# Patient Record
Sex: Female | Born: 1960
Health system: Southern US, Community
[De-identification: ages and names within clinical notes are randomized; demographics above are authoritative.]

## PROBLEM LIST (undated history)

## (undated) DIAGNOSIS — F988 Other specified behavioral and emotional disorders with onset usually occurring in childhood and adolescence: Secondary | ICD-10-CM

## (undated) DIAGNOSIS — K649 Unspecified hemorrhoids: Secondary | ICD-10-CM

## (undated) DIAGNOSIS — M25819 Other specified joint disorders, unspecified shoulder: Secondary | ICD-10-CM

## (undated) DIAGNOSIS — Z973 Presence of spectacles and contact lenses: Secondary | ICD-10-CM

## (undated) DIAGNOSIS — IMO0001 Reserved for inherently not codable concepts without codable children: Secondary | ICD-10-CM

## (undated) DIAGNOSIS — M797 Fibromyalgia: Secondary | ICD-10-CM

## (undated) DIAGNOSIS — J449 Chronic obstructive pulmonary disease, unspecified: Secondary | ICD-10-CM

## (undated) DIAGNOSIS — F419 Anxiety disorder, unspecified: Secondary | ICD-10-CM

## (undated) DIAGNOSIS — G2581 Restless legs syndrome: Secondary | ICD-10-CM

## (undated) DIAGNOSIS — D494 Neoplasm of unspecified behavior of bladder: Secondary | ICD-10-CM

## (undated) DIAGNOSIS — M7540 Impingement syndrome of unspecified shoulder: Secondary | ICD-10-CM

## (undated) DIAGNOSIS — E785 Hyperlipidemia, unspecified: Secondary | ICD-10-CM

## (undated) DIAGNOSIS — C801 Malignant (primary) neoplasm, unspecified: Secondary | ICD-10-CM

## (undated) DIAGNOSIS — Z872 Personal history of diseases of the skin and subcutaneous tissue: Secondary | ICD-10-CM

## (undated) DIAGNOSIS — R112 Nausea with vomiting, unspecified: Secondary | ICD-10-CM

## (undated) DIAGNOSIS — Z9889 Other specified postprocedural states: Secondary | ICD-10-CM

## (undated) DIAGNOSIS — M1990 Unspecified osteoarthritis, unspecified site: Secondary | ICD-10-CM

## (undated) DIAGNOSIS — Z85820 Personal history of malignant melanoma of skin: Secondary | ICD-10-CM

## (undated) DIAGNOSIS — D509 Iron deficiency anemia, unspecified: Secondary | ICD-10-CM

## (undated) DIAGNOSIS — N393 Stress incontinence (female) (male): Secondary | ICD-10-CM

## (undated) DIAGNOSIS — F329 Major depressive disorder, single episode, unspecified: Secondary | ICD-10-CM

## (undated) DIAGNOSIS — Z8709 Personal history of other diseases of the respiratory system: Secondary | ICD-10-CM

## (undated) DIAGNOSIS — E039 Hypothyroidism, unspecified: Secondary | ICD-10-CM

## (undated) DIAGNOSIS — D219 Benign neoplasm of connective and other soft tissue, unspecified: Secondary | ICD-10-CM

## (undated) DIAGNOSIS — I10 Essential (primary) hypertension: Secondary | ICD-10-CM

## (undated) DIAGNOSIS — F32A Depression, unspecified: Secondary | ICD-10-CM

## (undated) DIAGNOSIS — R7303 Prediabetes: Secondary | ICD-10-CM

## (undated) HISTORY — PX: HIP SURGERY: SHX245

## (undated) HISTORY — PX: CATARACT EXTRACTION W/ INTRAOCULAR LENS IMPLANT: SHX1309

## (undated) HISTORY — DX: Hyperlipidemia, unspecified: E78.5

## (undated) HISTORY — DX: Prediabetes: R73.03

## (undated) HISTORY — PX: ABDOMINAL HYSTERECTOMY: SHX81

## (undated) HISTORY — PX: TONSILLECTOMY: SUR1361

## (undated) HISTORY — DX: Essential (primary) hypertension: I10

## (undated) HISTORY — DX: Restless legs syndrome: G25.81

## (undated) HISTORY — DX: Chronic obstructive pulmonary disease, unspecified: J44.9

## (undated) HISTORY — DX: Malignant (primary) neoplasm, unspecified: C80.1

## (undated) SURGERY — ARTHROSCOPY, SHOULDER
Anesthesia: Choice | Laterality: Left

---
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
## 1997-11-10 ENCOUNTER — Other Ambulatory Visit: Admission: RE | Admit: 1997-11-10 | Discharge: 1997-11-10 | Payer: Self-pay | Admitting: Obstetrics and Gynecology

## 1998-10-02 ENCOUNTER — Inpatient Hospital Stay (HOSPITAL_COMMUNITY): Admission: EM | Admit: 1998-10-02 | Discharge: 1998-10-06 | Payer: Self-pay | Admitting: Emergency Medicine

## 1998-10-02 ENCOUNTER — Encounter: Payer: Self-pay | Admitting: Emergency Medicine

## 1998-10-02 ENCOUNTER — Encounter: Payer: Self-pay | Admitting: Family Medicine

## 1998-10-10 ENCOUNTER — Encounter: Admission: RE | Admit: 1998-10-10 | Discharge: 1998-10-10 | Payer: Self-pay | Admitting: Family Medicine

## 1998-10-31 ENCOUNTER — Encounter: Admission: RE | Admit: 1998-10-31 | Discharge: 1998-10-31 | Payer: Self-pay | Admitting: Family Medicine

## 1998-11-21 ENCOUNTER — Encounter: Admission: RE | Admit: 1998-11-21 | Discharge: 1998-11-21 | Payer: Self-pay | Admitting: Sports Medicine

## 1999-01-03 ENCOUNTER — Encounter: Admission: RE | Admit: 1999-01-03 | Discharge: 1999-01-03 | Payer: Self-pay | Admitting: Family Medicine

## 1999-01-22 ENCOUNTER — Encounter: Admission: RE | Admit: 1999-01-22 | Discharge: 1999-01-22 | Payer: Self-pay | Admitting: Family Medicine

## 1999-02-06 ENCOUNTER — Encounter: Admission: RE | Admit: 1999-02-06 | Discharge: 1999-02-06 | Payer: Self-pay | Admitting: Family Medicine

## 1999-08-28 ENCOUNTER — Encounter (INDEPENDENT_AMBULATORY_CARE_PROVIDER_SITE_OTHER): Payer: Self-pay

## 1999-08-28 ENCOUNTER — Other Ambulatory Visit: Admission: RE | Admit: 1999-08-28 | Discharge: 1999-08-28 | Payer: Self-pay | Admitting: Plastic Surgery

## 2000-06-17 ENCOUNTER — Other Ambulatory Visit: Admission: RE | Admit: 2000-06-17 | Discharge: 2000-06-17 | Payer: Self-pay | Admitting: Obstetrics and Gynecology

## 2001-03-09 ENCOUNTER — Ambulatory Visit (HOSPITAL_COMMUNITY): Admission: RE | Admit: 2001-03-09 | Discharge: 2001-03-09 | Payer: Self-pay | Admitting: Internal Medicine

## 2001-03-09 ENCOUNTER — Encounter: Payer: Self-pay | Admitting: Internal Medicine

## 2001-07-26 ENCOUNTER — Emergency Department (HOSPITAL_COMMUNITY): Admission: EM | Admit: 2001-07-26 | Discharge: 2001-07-27 | Payer: Self-pay | Admitting: Emergency Medicine

## 2001-07-26 ENCOUNTER — Encounter: Payer: Self-pay | Admitting: Emergency Medicine

## 2001-07-27 ENCOUNTER — Encounter: Payer: Self-pay | Admitting: Emergency Medicine

## 2001-08-26 ENCOUNTER — Encounter: Payer: Self-pay | Admitting: Internal Medicine

## 2001-08-26 ENCOUNTER — Ambulatory Visit (HOSPITAL_COMMUNITY): Admission: RE | Admit: 2001-08-26 | Discharge: 2001-08-26 | Payer: Self-pay | Admitting: Internal Medicine

## 2001-10-15 ENCOUNTER — Encounter: Payer: Self-pay | Admitting: Internal Medicine

## 2001-10-15 ENCOUNTER — Ambulatory Visit (HOSPITAL_COMMUNITY): Admission: RE | Admit: 2001-10-15 | Discharge: 2001-10-15 | Payer: Self-pay | Admitting: Internal Medicine

## 2002-10-10 ENCOUNTER — Ambulatory Visit (HOSPITAL_COMMUNITY): Admission: EM | Admit: 2002-10-10 | Discharge: 2002-10-10 | Payer: Self-pay | Admitting: Emergency Medicine

## 2002-10-10 ENCOUNTER — Encounter: Payer: Self-pay | Admitting: Emergency Medicine

## 2003-04-09 HISTORY — PX: LAPAROSCOPIC CHOLECYSTECTOMY: SUR755

## 2003-06-29 ENCOUNTER — Ambulatory Visit (HOSPITAL_COMMUNITY): Admission: RE | Admit: 2003-06-29 | Discharge: 2003-06-29 | Payer: Self-pay | Admitting: Internal Medicine

## 2003-11-03 ENCOUNTER — Other Ambulatory Visit: Admission: RE | Admit: 2003-11-03 | Discharge: 2003-11-03 | Payer: Self-pay | Admitting: Internal Medicine

## 2003-12-26 ENCOUNTER — Ambulatory Visit (HOSPITAL_COMMUNITY): Admission: RE | Admit: 2003-12-26 | Discharge: 2003-12-26 | Payer: Self-pay | Admitting: Internal Medicine

## 2004-05-24 ENCOUNTER — Encounter: Admission: RE | Admit: 2004-05-24 | Discharge: 2004-05-24 | Payer: Self-pay | Admitting: Internal Medicine

## 2005-06-05 ENCOUNTER — Other Ambulatory Visit: Admission: RE | Admit: 2005-06-05 | Discharge: 2005-06-05 | Payer: Self-pay | Admitting: Internal Medicine

## 2005-06-14 ENCOUNTER — Ambulatory Visit: Admission: RE | Admit: 2005-06-14 | Discharge: 2005-06-14 | Payer: Self-pay | Admitting: Internal Medicine

## 2005-06-24 ENCOUNTER — Encounter: Admission: RE | Admit: 2005-06-24 | Discharge: 2005-06-24 | Payer: Self-pay | Admitting: Internal Medicine

## 2005-10-09 HISTORY — PX: OTHER SURGICAL HISTORY: SHX169

## 2006-07-16 ENCOUNTER — Encounter (INDEPENDENT_AMBULATORY_CARE_PROVIDER_SITE_OTHER): Payer: Self-pay | Admitting: *Deleted

## 2006-07-16 ENCOUNTER — Ambulatory Visit (HOSPITAL_COMMUNITY): Admission: RE | Admit: 2006-07-16 | Discharge: 2006-07-16 | Payer: Self-pay | Admitting: Obstetrics and Gynecology

## 2006-07-16 HISTORY — PX: OTHER SURGICAL HISTORY: SHX169

## 2007-04-22 ENCOUNTER — Encounter: Admission: RE | Admit: 2007-04-22 | Discharge: 2007-04-22 | Payer: Self-pay | Admitting: Internal Medicine

## 2007-05-25 ENCOUNTER — Ambulatory Visit (HOSPITAL_COMMUNITY): Admission: RE | Admit: 2007-05-25 | Discharge: 2007-05-25 | Payer: Self-pay | Admitting: Internal Medicine

## 2008-05-27 ENCOUNTER — Encounter: Admission: RE | Admit: 2008-05-27 | Discharge: 2008-05-27 | Payer: Self-pay | Admitting: Family Medicine

## 2009-04-04 ENCOUNTER — Other Ambulatory Visit: Admission: RE | Admit: 2009-04-04 | Discharge: 2009-04-04 | Payer: Self-pay | Admitting: Internal Medicine

## 2009-04-08 HISTORY — PX: ANKLE FUSION: SHX881

## 2010-02-20 ENCOUNTER — Encounter: Payer: Self-pay | Admitting: Gastroenterology

## 2010-02-23 ENCOUNTER — Telehealth: Payer: Self-pay | Admitting: Gastroenterology

## 2010-05-05 LAB — HM PAP SMEAR: HM Pap smear: NORMAL

## 2010-05-10 NOTE — Progress Notes (Signed)
Summary: previsit letter ?'s  Phone Note Call from Patient Call back at [phone]   Caller: Patient Call For: Dr. Arlyce Dice Reason for Call: Talk to Nurse Summary of Call: previsit letter ?'s Initial call taken by: Vallarie Mare,  February 23, 2010 2:32 PM  Follow-up for Phone Call        Dr Arlyce Dice, This pt called regarding her Health History form recieved in the mail. She answered yes to the question Have you ever had any problems with anesthesia.. She stated she had a ankle fusion 3 months ago and became very nauseated after her operation from the sedation.... Is pt still ok to haver her direct procedure or does she need to be seen in the office?? Follow-up by: Merri Ray CMA Duncan Dull),  February 23, 2010 3:24 PM  Additional Follow-up for Phone Call Additional follow up Details #1::        direct procedure ok Additional Follow-up by: Louis Meckel MD,  February 25, 2010 8:45 PM    Additional Follow-up for Phone Call Additional follow up Details #2::    Pt informed to keep all appointments as scheduled. Follow-up by: Merri Ray CMA (AAMA),  February 26, 2010 8:00 AM

## 2010-05-10 NOTE — Letter (Signed)
Summary: Pre Visit Letter Revised  [facility]  [address] [place], [place] [address]   Phone: [phone]  Fax: [phone]        02/20/2010 MRN: [id number]  [name] [address] [place], [place]  [address]             Procedure Date: 04-11-10 10:30am            Dr Arlyce Dice   Welcome to the Gastroenterology Division at [facility].    You are scheduled to see a nurse for your pre-procedure visit on 03-28-10 at 10am on the 3rd floor at [facility], [address]  We ask that you try to arrive at our office 15 minutes prior to your appointment time to allow for check-in.  Please take a minute to review the attached form.  If you answer "Yes" to one or more of the questions on the first page, we ask that you call the person listed at your earliest opportunity.  If you answer "No" to all of the questions, please complete the rest of the form and bring it to your appointment.    Your nurse visit will consist of discussing your medical and surgical history, your immediate family medical history, and your medications.   If you are unable to list all of your medications on the form, please bring the medication bottles to your appointment and we will list them.  We will need to be aware of both prescribed and over the counter drugs.  We will need to know exact dosage information as well.    Please be prepared to read and sign documents such as consent forms, a financial agreement, and acknowledgement forms.  If necessary, and with your consent, a friend or relative is welcome to sit-in on the nurse visit with you.  Please bring your insurance card so that we may make a copy of it.  If your insurance requires a referral to see a specialist, please bring your referral form from your primary care physician.  No co-pay is required for this nurse visit.     If you cannot keep your appointment, please call [phone] to cancel or reschedule prior to your  appointment date.  This allows [place] the opportunity to schedule an appointment for another patient in need of care.    Thank you for choosing [facility] for your medical needs.  We appreciate the opportunity to care for you.  Please visit [place] at our website  to learn more about our practice.  Sincerely, The Gastroenterology Division

## 2010-06-20 ENCOUNTER — Other Ambulatory Visit (HOSPITAL_COMMUNITY)
Admission: RE | Admit: 2010-06-20 | Discharge: 2010-06-20 | Disposition: A | Discharge status: Home / Self Care | Payer: 59 | Source: Ambulatory Visit | Attending: Internal Medicine | Admitting: Internal Medicine

## 2010-06-20 ENCOUNTER — Other Ambulatory Visit: Payer: Self-pay | Admitting: Internal Medicine

## 2010-06-20 DIAGNOSIS — Z01419 Encounter for gynecological examination (general) (routine) without abnormal findings: Secondary | ICD-10-CM | POA: Insufficient documentation

## 2010-08-24 NOTE — Op Note (Signed)
   NAME:  [name], [name]                       ACCOUNT NO.:  [id number]   MEDICAL RECORD NO.:  [id number]                   PATIENT TYPE:  EMS   LOCATION:  ED                                   FACILITY:  [facility]   PHYSICIAN:  Artist Pais. Mina Marble, M.D.           DATE OF BIRTH:  09-14-1960   DATE OF PROCEDURE:  10/10/2002  DATE OF DISCHARGE:                                 OPERATIVE REPORT   PREOPERATIVE DIAGNOSIS:  Displaced fifth proximal phalangeal fracture of the  left hand.   POSTOPERATIVE DIAGNOSIS:  Displaced fifth proximal phalangeal fracture of  the left hand.   PROCEDURE:  Closed reduction and percutaneous pinning of the above fracture  with two 025 K-wire.   SURGEON:  Artist Pais. Mina Marble, M.D.   ASSISTANT:  None.   ANESTHESIA:  General.   TOURNIQUET TIME:  16 minutes.   COMPLICATIONS:  None.   DRAINS:  None.   DESCRIPTION OF PROCEDURE:  The patient was taken to the operating room.  After the induction of adequate general anesthesia, the left upper extremity  was prepped and draped in the usual sterile fashion.  An Esmarch was used to  exsanguinate the limb.  Tourniquet inflated at 250 mmHg.  At this point in  time, a closed reduction was performed of a displaced fifth digit proximal  phalangeal fracture under fluoroscopic guidance.  At this point in time, two  025 K-wires were driven from proximal distal across the fracture site in a  cross ward fashion.  Intraoperative x-rays in both AP, lateral, and oblique  views showed good reduction and good placement of the hardware.  The pins  were cut outside the skin and bent upon themselves.  The patient was then  placed in a sterile dressing of Xeroform, 4 x 4, and an ulnar gutter splint.  The patient tolerated the procedure well.  Went to recovery room in stable  fashion.                                               Artist Pais Mina Marble, M.D.    MAW/MEDQ  D:  10/11/2002  T:  10/11/2002  Job:  [id number]

## 2010-08-24 NOTE — Consult Note (Signed)
   NAME:  [name], [name]                       ACCOUNT NO.:  [id number]   MEDICAL RECORD NO.:  [id number]                   PATIENT TYPE:  EMS   LOCATION:  ED                                   FACILITY:  [facility]   PHYSICIAN:  Artist Pais. Mina Marble, M.D.           DATE OF BIRTH:  [birth year]   DATE OF CONSULTATION:  10/11/2002  DATE OF DISCHARGE:                                   CONSULTATION   PHYSICIAN REQUESTING CONSULTATION:  Dr. Donnetta Hutching   REASON FOR CONSULTATION:  [name] is a very pleasant 50 year old right-hand  dominant female who fell on her outstretched left hand and sustained injury  with obvious deformity.  She is an otherwise healthy 49 year old female with  an allergy to sulfa drugs.  No current medications or recent  hospitalizations.   SURGERY:  Dr. Amanda Pea performed a left ulnar nerve transposition with good  results in the past three months.   FAMILY MEDICAL HISTORY:  Noncontributory.   SOCIAL HISTORY:  Noncontributory.   EXAMINATION:  Well developed, well nourished female, pleasant, alert x3.  Examination of her left hand - she has an obvious deformity, pain and  swelling over the proximal phalanx of her non dominant left fifth finger.  Range of motion is limited.  Again, ecchymosis is present.  X-ray show a  fracture at the base of the proximal phalanx with angulation in both the AP  and lateral planes.   IMPRESSION:  [age]-year-old female with a displaced fracture of the  proximal phalanx, non dominant left fifth digit, with pain and deformity.   RECOMMENDATION:  At this point in time we are going to take her to the  operating room at the patient's request for closed reduction and  percutaneous pinning of above fracture.                                               Artist Pais Mina Marble, M.D.    MAW/MEDQ  D:  10/11/2002  T:  10/11/2002  Job:  [id number]

## 2010-08-24 NOTE — Op Note (Signed)
NAME:  [name], [name]             ACCOUNT NO.:  [id number]   MEDICAL RECORD NO.:  [id number]          PATIENT TYPE:  AMB   LOCATION:  SDC                           FACILITY:  [facility]   PHYSICIAN:  Carrington Clamp, M.D. DATE OF BIRTH:  1961-03-25   DATE OF PROCEDURE:  07/16/2006  DATE OF DISCHARGE:                               OPERATIVE REPORT   PREOPERATIVE DIAGNOSIS:  Menometrorrhagia.   POSTOPERATIVE DIAGNOSIS:  Menometrorrhagia.   OPERATION/PROCEDURE:  Dilation and curettage with hysteroscopy and  NovaSure ablation.   SURGEON:  Carrington Clamp, M.D.   ASSISTANT:  None.   ANESTHESIA:  General LMA.   SPECIMENS:  Uterine curettings.   ESTIMATED BLOOD LOSS:  Minimal.   IV FLUIDS:  _routine amount .   URINE OUTPUT:  Not measured.   COMPLICATIONS:  Were a small laceration of the cervix from the tenaculum  which was repaired with 2-0 Vicryl.   COUNTS:  Correct x3.   MEDICATIONS:  None.   DESCRIPTION OF PROCEDURE:  After adequate general anesthesia was  achieved, the patient was prepped and draped in usual sterile fashion in  dorsal lithotomy position.  Bladder was emptied with a red rubber  catheter.  Speculum placed in the vagina.  Single-tooth tenaculum placed  on the cervix and the cervix dilated with Shawnie Pons dilators.  The cervix  was measured with a Hanks dilator to 4.5 cm.  The uterus sounded to 10.5  cm giving a cavity length of 6 cm.  The width of the uterus was 4.2 cm.  The hysteroscope was passed in the uterine cavity and no fibroids or  large polyps were noted.  A curettage was then performed with sharp  curettage and then the NovaSure ablation instrument was seated into the  uterine cavity.  As noted above the cavity length was 6 cm and the width  was 4.2.  Power was set of 139 and time was 59 seconds.  The NovaSure  had been performed after it passed the CO2 test.  NovaSure ablation  instrument was then removed and the hysteroscope passed back inside the  cervix.  The NovaSure was found to have contacted all areas of the  uterus.  During the seating of the NovaSure instrument, the  tenaculum had been pulled off while I was trying to make sure the cavity  width was set correctly.  This created a small laceration that was  closed with a figure-of-eight stitch of 2-0 Vicryl.  Hemostasis was  achieved.  All instruments withdrawn from the vagina.      Carrington Clamp, M.D.  Electronically Signed     MH/MEDQ  D:  07/16/2006  T:  07/16/2006  Job:  [id number]

## 2011-01-17 ENCOUNTER — Other Ambulatory Visit: Payer: Self-pay | Admitting: Family Medicine

## 2011-01-17 DIAGNOSIS — M25572 Pain in left ankle and joints of left foot: Secondary | ICD-10-CM

## 2011-01-21 ENCOUNTER — Ambulatory Visit
Admission: RE | Admit: 2011-01-21 | Discharge: 2011-01-21 | Disposition: A | Discharge status: Home / Self Care | Payer: 59 | Source: Ambulatory Visit | Attending: Family Medicine | Admitting: Family Medicine

## 2011-01-21 DIAGNOSIS — M25572 Pain in left ankle and joints of left foot: Secondary | ICD-10-CM

## 2011-04-30 ENCOUNTER — Other Ambulatory Visit: Payer: Self-pay | Admitting: Family Medicine

## 2011-04-30 DIAGNOSIS — M25512 Pain in left shoulder: Secondary | ICD-10-CM

## 2011-04-30 DIAGNOSIS — M542 Cervicalgia: Secondary | ICD-10-CM

## 2011-05-06 ENCOUNTER — Other Ambulatory Visit: Payer: 59

## 2011-05-06 ENCOUNTER — Ambulatory Visit
Admission: RE | Admit: 2011-05-06 | Discharge: 2011-05-06 | Disposition: A | Discharge status: Home / Self Care | Payer: 59 | Source: Ambulatory Visit | Attending: Family Medicine | Admitting: Family Medicine

## 2011-05-06 DIAGNOSIS — M25512 Pain in left shoulder: Secondary | ICD-10-CM

## 2011-05-23 ENCOUNTER — Ambulatory Visit
Admission: RE | Admit: 2011-05-23 | Discharge: 2011-05-23 | Disposition: A | Discharge status: Home / Self Care | Payer: 59 | Source: Ambulatory Visit | Attending: Family Medicine | Admitting: Family Medicine

## 2011-05-23 DIAGNOSIS — M542 Cervicalgia: Secondary | ICD-10-CM

## 2011-07-04 LAB — HM COLONOSCOPY: HM Colonoscopy: 7

## 2011-11-07 ENCOUNTER — Other Ambulatory Visit: Payer: Self-pay | Admitting: Orthopedic Surgery

## 2011-11-25 ENCOUNTER — Encounter (HOSPITAL_BASED_OUTPATIENT_CLINIC_OR_DEPARTMENT_OTHER): Payer: Self-pay | Admitting: *Deleted

## 2011-11-25 NOTE — Progress Notes (Signed)
No labs needed

## 2011-11-27 ENCOUNTER — Encounter (HOSPITAL_BASED_OUTPATIENT_CLINIC_OR_DEPARTMENT_OTHER): Payer: Self-pay | Admitting: Anesthesiology

## 2011-11-27 ENCOUNTER — Ambulatory Visit (HOSPITAL_BASED_OUTPATIENT_CLINIC_OR_DEPARTMENT_OTHER)
Admission: RE | Admit: 2011-11-27 | Discharge: 2011-11-27 | Disposition: A | Discharge status: Home / Self Care | Payer: 59 | Source: Ambulatory Visit | Attending: Orthopedic Surgery | Admitting: Orthopedic Surgery

## 2011-11-27 ENCOUNTER — Encounter (HOSPITAL_BASED_OUTPATIENT_CLINIC_OR_DEPARTMENT_OTHER): Payer: Self-pay | Admitting: *Deleted

## 2011-11-27 ENCOUNTER — Encounter (HOSPITAL_BASED_OUTPATIENT_CLINIC_OR_DEPARTMENT_OTHER)
Admission: RE | Disposition: A | Discharge status: Home / Self Care | Payer: Self-pay | Source: Ambulatory Visit | Attending: Orthopedic Surgery

## 2011-11-27 ENCOUNTER — Ambulatory Visit (HOSPITAL_BASED_OUTPATIENT_CLINIC_OR_DEPARTMENT_OTHER): Payer: 59 | Admitting: Anesthesiology

## 2011-11-27 DIAGNOSIS — M19019 Primary osteoarthritis, unspecified shoulder: Secondary | ICD-10-CM | POA: Insufficient documentation

## 2011-11-27 DIAGNOSIS — M25819 Other specified joint disorders, unspecified shoulder: Secondary | ICD-10-CM | POA: Insufficient documentation

## 2011-11-27 DIAGNOSIS — M249 Joint derangement, unspecified: Secondary | ICD-10-CM | POA: Insufficient documentation

## 2011-11-27 HISTORY — DX: Other specified postprocedural states: R11.2

## 2011-11-27 HISTORY — PX: SHOULDER ARTHROSCOPY W/ SUBACROMIAL DECOMPRESSION AND DISTAL CLAVICLE EXCISION: SHX2401

## 2011-11-27 HISTORY — DX: Other specified postprocedural states: Z98.890

## 2011-11-27 LAB — POCT HEMOGLOBIN-HEMACUE: Hemoglobin: 13.6 g/dL (ref 12.0–15.0)

## 2011-11-27 SURGERY — SHOULDER ARTHROSCOPY WITH SUBACROMIAL DECOMPRESSION
Anesthesia: General | Site: Shoulder | Laterality: Left | Wound class: Clean

## 2011-11-27 MED ORDER — HYDROMORPHONE HCL PF 1 MG/ML IJ SOLN: 0.2500 mg | INTRAMUSCULAR | Status: DC | PRN

## 2011-11-27 MED ORDER — SUCCINYLCHOLINE CHLORIDE 20 MG/ML IJ SOLN
INTRAMUSCULAR | Status: DC | PRN
  Administered 2011-11-27: 100 mg via INTRAVENOUS

## 2011-11-27 MED ORDER — METOCLOPRAMIDE HCL 5 MG/ML IJ SOLN: 10.0000 mg | Freq: Once | INTRAMUSCULAR | Status: DC | PRN

## 2011-11-27 MED ORDER — ONDANSETRON HCL 4 MG/2ML IJ SOLN
INTRAMUSCULAR | Status: DC | PRN
  Administered 2011-11-27: 4 mg via INTRAVENOUS

## 2011-11-27 MED ORDER — EPINEPHRINE HCL 1 MG/ML IJ SOLN
INTRAMUSCULAR | Status: DC | PRN
  Administered 2011-11-27: 1 mg via SUBCUTANEOUS

## 2011-11-27 MED ORDER — METOCLOPRAMIDE HCL 5 MG/ML IJ SOLN
INTRAMUSCULAR | Status: DC | PRN
  Administered 2011-11-27: 10 mg via INTRAVENOUS

## 2011-11-27 MED ORDER — BUPIVACAINE HCL (PF) 0.25 % IJ SOLN
INTRAMUSCULAR | Status: DC | PRN
  Administered 2011-11-27: 18 mL

## 2011-11-27 MED ORDER — DEXAMETHASONE SODIUM PHOSPHATE 4 MG/ML IJ SOLN
INTRAMUSCULAR | Status: DC | PRN
  Administered 2011-11-27: 10 mg via INTRAVENOUS

## 2011-11-27 MED ORDER — FENTANYL CITRATE 0.05 MG/ML IJ SOLN
INTRAMUSCULAR | Status: DC | PRN
  Administered 2011-11-27: 50 ug via INTRAVENOUS
  Administered 2011-11-27: 25 ug via INTRAVENOUS

## 2011-11-27 MED ORDER — LACTATED RINGERS IV SOLN
INTRAVENOUS | Status: DC
  Administered 2011-11-27 (×3): via INTRAVENOUS

## 2011-11-27 MED ORDER — OXYCODONE HCL 5 MG/5ML PO SOLN: 5.0000 mg | Freq: Once | ORAL | Status: AC | PRN

## 2011-11-27 MED ORDER — POVIDONE-IODINE 7.5 % EX SOLN
Freq: Once | CUTANEOUS | Status: AC
  Administered 2011-11-27: 1 via TOPICAL

## 2011-11-27 MED ORDER — PROPOFOL 10 MG/ML IV EMUL
INTRAVENOUS | Status: DC | PRN
  Administered 2011-11-27: 250 mg via INTRAVENOUS

## 2011-11-27 MED ORDER — SODIUM CHLORIDE 0.9 % IR SOLN
Status: DC | PRN
  Administered 2011-11-27: 3000 mL

## 2011-11-27 MED ORDER — OXYCODONE-ACETAMINOPHEN 5-325 MG PO TABS: 1.0000 | ORAL_TABLET | ORAL | Status: AC | PRN

## 2011-11-27 MED ORDER — CEFAZOLIN SODIUM-DEXTROSE 2-3 GM-% IV SOLR
2.0000 g | INTRAVENOUS | Status: AC
  Administered 2011-11-27: 2 g via INTRAVENOUS

## 2011-11-27 MED ORDER — FENTANYL CITRATE 0.05 MG/ML IJ SOLN
50.0000 ug | INTRAMUSCULAR | Status: DC | PRN
  Administered 2011-11-27: 100 ug via INTRAVENOUS

## 2011-11-27 MED ORDER — OXYCODONE HCL 5 MG PO TABS
5.0000 mg | ORAL_TABLET | Freq: Once | ORAL | Status: AC | PRN
  Administered 2011-11-27: 5 mg via ORAL

## 2011-11-27 MED ORDER — MIDAZOLAM HCL 2 MG/2ML IJ SOLN
1.0000 mg | INTRAMUSCULAR | Status: DC | PRN
  Administered 2011-11-27: 2 mg via INTRAVENOUS

## 2011-11-27 SURGICAL SUPPLY — 74 items
APL SKNCLS STERI-STRIP NONHPOA (GAUZE/BANDAGES/DRESSINGS)
BENZOIN TINCTURE PRP APPL 2/3 (GAUZE/BANDAGES/DRESSINGS) IMPLANT
BLADE SURG 15 STRL LF DISP TIS (BLADE) IMPLANT
BLADE SURG 15 STRL SS (BLADE)
BLADE VORTEX 6.0 (BLADE) ×2 IMPLANT
CANISTER OMNI JUG 16 LITER (MISCELLANEOUS) ×2 IMPLANT
CANISTER SUCTION 2500CC (MISCELLANEOUS) IMPLANT
CANNULA 5.75X71 LONG (CANNULA) IMPLANT
CANNULA TWIST IN 8.25X7CM (CANNULA) IMPLANT
CLOTH BEACON ORANGE TIMEOUT ST (SAFETY) ×2 IMPLANT
CUTTER MENISCUS  4.2MM (BLADE) ×1
CUTTER MENISCUS 4.2MM (BLADE) ×1 IMPLANT
DECANTER SPIKE VIAL GLASS SM (MISCELLANEOUS) IMPLANT
DRAPE INCISE IOBAN 66X45 STRL (DRAPES) ×2 IMPLANT
DRAPE STERI 35X30 U-POUCH (DRAPES) ×2 IMPLANT
DRAPE SURG 17X23 STRL (DRAPES) ×2 IMPLANT
DRAPE U-SHAPE 47X51 STRL (DRAPES) ×2 IMPLANT
DRAPE U-SHAPE 76X120 STRL (DRAPES) ×4 IMPLANT
DRSG EMULSION OIL 3X3 NADH (GAUZE/BANDAGES/DRESSINGS) ×2 IMPLANT
DRSG PAD ABDOMINAL 8X10 ST (GAUZE/BANDAGES/DRESSINGS) ×4 IMPLANT
DURAPREP 26ML APPLICATOR (WOUND CARE) ×2 IMPLANT
ELECT REM PT RETURN 9FT ADLT (ELECTROSURGICAL) ×2
ELECTRODE REM PT RTRN 9FT ADLT (ELECTROSURGICAL) ×1 IMPLANT
GLOVE BIO SURGEON STRL SZ 6.5 (GLOVE) ×1 IMPLANT
GLOVE BIOGEL PI IND STRL 8 (GLOVE) ×2 IMPLANT
GLOVE BIOGEL PI INDICATOR 8 (GLOVE) ×2
GLOVE ECLIPSE 7.5 STRL STRAW (GLOVE) ×4 IMPLANT
GLOVE INDICATOR 7.0 STRL GRN (GLOVE) ×1 IMPLANT
GOWN BRE IMP PREV XXLGXLNG (GOWN DISPOSABLE) ×2 IMPLANT
GOWN PREVENTION PLUS XLARGE (GOWN DISPOSABLE) ×3 IMPLANT
GOWN PREVENTION PLUS XXLARGE (GOWN DISPOSABLE) ×2 IMPLANT
NDL 1/2 CIR CATGUT .05X1.09 (NEEDLE) IMPLANT
NDL HYPO 18GX1.5 BLUNT FILL (NEEDLE) ×1 IMPLANT
NDL SCORPION MULTI FIRE (NEEDLE) IMPLANT
NDL SUT 6 .5 CRC .975X.05 MAYO (NEEDLE) IMPLANT
NEEDLE 1/2 CIR CATGUT .05X1.09 (NEEDLE) IMPLANT
NEEDLE HYPO 18GX1.5 BLUNT FILL (NEEDLE) ×2 IMPLANT
NEEDLE MAYO TAPER (NEEDLE)
NEEDLE SCORPION MULTI FIRE (NEEDLE) IMPLANT
NS IRRIG 1000ML POUR BTL (IV SOLUTION) IMPLANT
PACK ARTHROSCOPY DSU (CUSTOM PROCEDURE TRAY) ×2 IMPLANT
PACK BASIN DAY SURGERY FS (CUSTOM PROCEDURE TRAY) ×2 IMPLANT
PASSER SUT SWANSON 36MM LOOP (INSTRUMENTS) IMPLANT
PENCIL BUTTON HOLSTER BLD 10FT (ELECTRODE) IMPLANT
SET IRRIG Y TYPE TUR BLADDER L (SET/KITS/TRAYS/PACK) ×2 IMPLANT
SLING ARM FOAM STRAP LRG (SOFTGOODS) IMPLANT
SLING ARM FOAM STRAP MED (SOFTGOODS) IMPLANT
SLING ARM FOAM STRAP XLG (SOFTGOODS) IMPLANT
SLING ARM IMMOBILIZER LRG (SOFTGOODS) IMPLANT
SPONGE GAUZE 4X4 12PLY (GAUZE/BANDAGES/DRESSINGS) ×2 IMPLANT
SPONGE LAP 4X18 X RAY DECT (DISPOSABLE) IMPLANT
STRIP CLOSURE SKIN 1/2X4 (GAUZE/BANDAGES/DRESSINGS) IMPLANT
SUCTION FRAZIER TIP 10 FR DISP (SUCTIONS) IMPLANT
SUT ETHIBOND 2 OS 4 DA (SUTURE) IMPLANT
SUT ETHILON 4 0 PS 2 18 (SUTURE) IMPLANT
SUT MNCRL AB 3-0 PS2 18 (SUTURE) IMPLANT
SUT PDS AB 0 CT 36 (SUTURE) IMPLANT
SUT PROLENE 3 0 PS 2 (SUTURE) IMPLANT
SUT TICRON 1 T 12 (SUTURE) IMPLANT
SUT TIGER TAPE 7 IN WHITE (SUTURE) IMPLANT
SUT VIC AB 0 CT1 27 (SUTURE)
SUT VIC AB 0 CT1 27XBRD ANBCTR (SUTURE) IMPLANT
SUT VIC AB 1 CT1 27 (SUTURE)
SUT VIC AB 1 CT1 27XBRD ANBCTR (SUTURE) IMPLANT
SUT VIC AB 2-0 SH 27 (SUTURE)
SUT VIC AB 2-0 SH 27XBRD (SUTURE) IMPLANT
SYR 5ML LL (SYRINGE) ×2 IMPLANT
TAPE FIBER 2MM 7IN #2 BLUE (SUTURE) IMPLANT
TOWEL OR 17X24 6PK STRL BLUE (TOWEL DISPOSABLE) ×2 IMPLANT
TOWEL OR NON WOVEN STRL DISP B (DISPOSABLE) ×2 IMPLANT
TUBE CONNECTING 20X1/4 (TUBING) IMPLANT
WAND STAR VAC 90 (SURGICAL WAND) ×2 IMPLANT
WATER STERILE IRR 1000ML POUR (IV SOLUTION) ×2 IMPLANT
YANKAUER SUCT BULB TIP NO VENT (SUCTIONS) IMPLANT

## 2011-11-27 NOTE — H&P (Signed)
PREOPERATIVE H&P  Chief Complaint: l shoulder pain  HPI: [name] is a 51 y.o. female who presents for evaluation of l shoulder pain. It has been present for greater than 6 mon and has been worsening. She has failed conservative measures. Pain is rated as moderate.  Past Medical History  Diagnosis Date  . No pertinent past medical history   . PONV (postoperative nausea and vomiting)    Past Surgical History  Procedure Date  . Ankle fusion 2011    right  . Cholecystectomy   . Eye surgery     rt cataract  . Dilation and curettage of uterus   . Tubal ligation   . Wrist fracture surgery 2004    left  . Endometrial ablation 2008  . Tonsillectomy    History   Social History  . Marital Status: Married    Spouse Name: N/A    Number of Children: N/A  . Years of Education: N/A   Social History Main Topics  . Smoking status: Former Smoker    Quit date: 11/25/2010  . Smokeless tobacco: None  . Alcohol Use: No  . Drug Use: No  . Sexually Active:    Other Topics Concern  . None   Social History Narrative  . None   History reviewed. No pertinent family history. Allergies  Allergen Reactions  . Sulfa Antibiotics Hives   Prior to Admission medications   Not on File     Positive ROS: none  All other systems have been reviewed and were otherwise negative with the exception of those mentioned in the HPI and as above.  Physical Exam: Filed Vitals:   11/27/11 0816  BP: 117/67  Pulse: 63  Temp:   Resp: 17    General: Alert, no acute distress Cardiovascular: No pedal edema Respiratory: No cyanosis, no use of accessory musculature GI: No organomegaly, abdomen is soft and non-tender Skin: No lesions in the area of chief complaint Neurologic: Sensation intact distally Psychiatric: Patient is competent for consent with normal mood and affect Lymphatic: No axillary or cervical lymphadenopathy  MUSCULOSKELETAL: l shoulder: +ttp over l shoulder// +  impingement good er strength.  Assessment/Plan: LEFT SHOULDER IMPENGEMENT WITH AC JOINT ARTHRITIS Plan for Procedure(s): SHOULDER ARTHROSCOPY WITH SUBACROMIAL DECOMPRESSION  The risks benefits and alternatives were discussed with the patient including but not limited to the risks of nonoperative treatment, versus surgical intervention including infection, bleeding, nerve injury, malunion, nonunion, hardware prominence, hardware failure, need for hardware removal, blood clots, cardiopulmonary complications, morbidity, mortality, among others, and they were willing to proceed.  Predicted outcome is good, although there will be at least a six to nine month expected recovery.  Mouhamed Glassco L, MD 11/27/2011 8:37 AM

## 2011-11-27 NOTE — Progress Notes (Signed)
Assisted Dr. Frederick with left, ultrasound guided, interscalene  block. Side rails up, monitors on throughout procedure. See vital signs in flow sheet. Tolerated Procedure well. 

## 2011-11-27 NOTE — Brief Op Note (Signed)
11/27/2011  9:50 AM  PATIENT:  Junius Argyle  51 y.o. female  PRE-OPERATIVE DIAGNOSIS:  LEFT SHOULDER IMPENGEMENT WITH AC JOINT ARTHRITIS  POST-OPERATIVE DIAGNOSIS:  LEFT SHOULDER IMPENGEMENT WITH AC JOINT  PROCEDURE:  Procedure(s) (LRB): SHOULDER ARTHROSCOPY WITH SUBACROMIAL DECOMPRESSION (Left)  SURGEON:  Surgeon(s) and Role:    * Harvie Junior, MD - Primary  PHYSICIAN ASSISTANT:   ASSISTANTS: none   ANESTHESIA:   general  EBL:  Total I/O In: 1000 [I.V.:1000] Out: -   BLOOD ADMINISTERED:none  DRAINS: none   LOCAL MEDICATIONS USED:  MARCAINE     SPECIMEN:  No Specimen  DISPOSITION OF SPECIMEN:  N/A  COUNTS:  YES  TOURNIQUET:  * No tourniquets in log *  DICTATION: .Other Dictation: Dictation Number did dictation but did not get number  PLAN OF CARE: Discharge to home after PACU  PATIENT DISPOSITION:  PACU - hemodynamically stable.   Delay start of Pharmacological VTE agent (>24hrs) due to surgical blood loss or risk of bleeding: not applicable

## 2011-11-27 NOTE — Transfer of Care (Signed)
Immediate Anesthesia Transfer of Care Note  Patient: [name]  Procedure(s) Performed: Procedure(s) (LRB): SHOULDER ARTHROSCOPY WITH SUBACROMIAL DECOMPRESSION (Left)  Patient Location: PACU  Anesthesia Type: GA combined with regional for post-op pain  Level of Consciousness: awake and patient cooperative  Airway & Oxygen Therapy: Patient Spontanous Breathing and Patient connected to face mask oxygen  Post-op Assessment: Report given to PACU RN and Post -op Vital signs reviewed and stable  Post vital signs: Reviewed and stable  Complications: No apparent anesthesia complications

## 2011-11-27 NOTE — Anesthesia Procedure Notes (Addendum)
Anesthesia Regional Block:  Interscalene brachial plexus block  Pre-Anesthetic Checklist: ,, timeout performed, Correct Patient, Correct Site, Correct Laterality, Correct Procedure, Correct Position, site marked, Risks and benefits discussed,  Surgical consent,  Pre-op evaluation,  At surgeon's request and post-op pain management  Laterality: Left  Prep: chloraprep       Needles:   Needle Type: Other   (Arrow Echogenic)   Needle Length: 9cm  Needle Gauge: 21    Additional Needles:  Procedures: ultrasound guided Interscalene brachial plexus block Narrative:  Start time: 11/27/2011 8:08 AM End time: 11/27/2011 8:16 AM Injection made incrementally with aspirations every 5 mL.  Performed by: Personally  Anesthesiologist: Aldona Lento, MD  Additional Notes: Ultrasound guidance used to: id relevant anatomy, confirm needle position, local anesthetic spread, avoidance of vascular puncture. Picture saved. No complications. Block performed personally by Janetta Hora. Gelene Mink, MD    Interscalene brachial plexus block Procedure Name: Intubation Date/Time: 11/27/2011 8:51 AM Performed by: Gar Gibbon Pre-anesthesia Checklist: Patient identified, Emergency Drugs available, Suction available and Patient being monitored Patient Re-evaluated:Patient Re-evaluated prior to inductionOxygen Delivery Method: Circle System Utilized Preoxygenation: Pre-oxygenation with 100% oxygen Intubation Type: IV induction Ventilation: Mask ventilation without difficulty Laryngoscope Size: Miller and 2 Grade View: Grade II Tube type: Oral Tube size: 7.0 mm Number of attempts: 1 Airway Equipment and Method: stylet and oral airway Placement Confirmation: ETT inserted through vocal cords under direct vision,  positive ETCO2 and breath sounds checked- equal and bilateral Secured at: 21 cm Tube secured with: Tape Dental Injury: Teeth and Oropharynx as per pre-operative assessment

## 2011-11-27 NOTE — Anesthesia Preprocedure Evaluation (Signed)
Anesthesia Evaluation  Patient identified by MRN, date of birth, ID band Patient awake    Reviewed: Allergy & Precautions, H&P , NPO status , Patient's Chart, lab work & pertinent test results, reviewed documented beta blocker date and time   History of Anesthesia Complications (+) PONV  Airway Mallampati: II TM Distance: >3 FB Neck ROM: full    Dental   Pulmonary neg pulmonary ROS,  breath sounds clear to auscultation        Cardiovascular negative cardio ROS  Rhythm:regular     Neuro/Psych negative neurological ROS  negative psych ROS   GI/Hepatic negative GI ROS, Neg liver ROS,   Endo/Other  Morbid obesity  Renal/GU negative Renal ROS  negative genitourinary   Musculoskeletal   Abdominal   Peds  Hematology negative hematology ROS (+)   Anesthesia Other Findings See surgeon's H&P   Reproductive/Obstetrics negative OB ROS                           Anesthesia Physical Anesthesia Plan  ASA: III  Anesthesia Plan: General   Post-op Pain Management:    Induction: Intravenous  Airway Management Planned: Oral ETT  Additional Equipment:   Intra-op Plan:   Post-operative Plan: Extubation in OR  Informed Consent: I have reviewed the patients History and Physical, chart, labs and discussed the procedure including the risks, benefits and alternatives for the proposed anesthesia with the patient or authorized representative who has indicated his/her understanding and acceptance.   Dental Advisory Given  Plan Discussed with: CRNA and Surgeon  Anesthesia Plan Comments:         Anesthesia Quick Evaluation

## 2011-11-27 NOTE — Anesthesia Postprocedure Evaluation (Signed)
Anesthesia Post Note  Patient: [name]  Procedure(s) Performed: Procedure(s) (LRB): SHOULDER ARTHROSCOPY WITH SUBACROMIAL DECOMPRESSION (Left)  Anesthesia type: General  Patient location: PACU  Post pain: Pain level controlled  Post assessment: Patient's Cardiovascular Status Stable  Last Vitals:  Filed Vitals:   11/27/11 1037  BP: 127/55  Pulse: 73  Temp: 36.5 C  Resp: 18    Post vital signs: Reviewed and stable  Level of consciousness: alert  Complications: No apparent anesthesia complications

## 2011-11-28 NOTE — Op Note (Signed)
NAME:  QUANTAVIA, FRITH             ACCOUNT NO.:  [id number]  MEDICAL RECORD NO.:  [id number]  LOCATION:                               FACILITY:  [facility]  PHYSICIAN:  Harvie Junior, M.D.        DATE OF BIRTH:  DATE OF PROCEDURE:  11/27/2011 DATE OF DISCHARGE:  11/27/2011                              OPERATIVE REPORT   PREOPERATIVE DIAGNOSES: 1. Impingement. 2. Acromioclavicular joint arthritis. 3. Rotator cuff inflammation.  POSTOPERATIVE DIAGNOSES: 1. Impingement. 2. Acromioclavicular joint arthritis. 3. Rotator cuff inflammation. 4. Superior labral tear anterior to posterior.  PROCEDURES: 1. Arthroscopic subacromial decompression from the anterior-to-lateral     compartment. 2. Distal clavicle resection over 20 mm. 3. Debridement of superior labral tear anterior to posterior within     the glenohumeral joint.  SURGEON:  Harvie Junior, M.D.  ANESTHESIA:  General.  BRIEF HISTORY:  Ms. Hepp is a 50 year old female with long history of significant left shoulder pain.  We treated her conservatively for a period of time.  She improved well with injection therapy twice and we talked about treatment options including possibility of repeat injection.  MRI was obtained and it showed supraspinatus tendinopathy, but otherwise, unremarkable with impingement and AC joint arthritis. After failure of conservative care, she is taken to the operating room for left shoulder arthroscopy.  DESCRIPTION OF PROCEDURE:  The patient was taken to the operating room. After adequate anesthesia was obtained under general anesthetic, the patient was placed supine on operating table.  Left shoulder was prepped and draped in the usual sterile fashion.  Following this, routine arthroscopic examination of the shoulder revealed that there was obvious superior labral tearing anterior to posterior.  This was debrided within the glenohumeral joint.  There was significant undersurface fraying of the  rotator cuff at supraspinatus insertion.  This was debrided.  This was felt to have a good thickness from the inside.  The glenohumeral joint looked pristine.  The biceps tendon was well anchored.  The superior labral tear was debrided anterior to posterior and smoothed. Once this was done, attention was turned out to the glenohumeral joint into the subacromial space.  An anterolateral acromioplasty was performed from the lateral and posterior compartment.  Distal clavicle resection over 20 mm from anterior portal, and following this, the shoulder was copiously and thoroughly lavaged and suctioned dry.  A subtotal bursectomy was performed.  Final check was made of the cuff both in internal and external rotation at the far lateral side.  No evidence of high-grade partial tear from the top side.  So at this point, we felt that the rotator cuff integrity was fine. The shoulder was copiously and thoroughly lavaged, suctioned dry.  All sterile portals were closed with bandage.  A sterile compressive dressing was applied.  The patient was taken to the recovery room and was noted to be in satisfactory condition.  Estimated blood loss for procedure was none.     Harvie Junior, M.D.     Ranae Plumber  D:  11/27/2011  T:  11/27/2011  Job:  [id number]

## 2012-03-19 ENCOUNTER — Other Ambulatory Visit: Payer: Self-pay | Admitting: Dermatology

## 2012-05-18 ENCOUNTER — Encounter (HOSPITAL_BASED_OUTPATIENT_CLINIC_OR_DEPARTMENT_OTHER): Payer: Self-pay | Admitting: *Deleted

## 2012-05-18 NOTE — Progress Notes (Signed)
Pt was here for shoulder 8/13-did well No labs needed

## 2012-05-20 ENCOUNTER — Other Ambulatory Visit: Payer: Self-pay | Admitting: Orthopedic Surgery

## 2012-05-22 ENCOUNTER — Ambulatory Visit (HOSPITAL_BASED_OUTPATIENT_CLINIC_OR_DEPARTMENT_OTHER)
Admission: RE | Admit: 2012-05-22 | Discharge: 2012-05-22 | Disposition: A | Discharge status: Home / Self Care | Payer: 59 | Source: Ambulatory Visit | Attending: Orthopedic Surgery | Admitting: Orthopedic Surgery

## 2012-05-22 ENCOUNTER — Encounter (HOSPITAL_BASED_OUTPATIENT_CLINIC_OR_DEPARTMENT_OTHER): Payer: Self-pay | Admitting: *Deleted

## 2012-05-22 ENCOUNTER — Ambulatory Visit (HOSPITAL_BASED_OUTPATIENT_CLINIC_OR_DEPARTMENT_OTHER): Payer: 59 | Admitting: *Deleted

## 2012-05-22 ENCOUNTER — Encounter (HOSPITAL_BASED_OUTPATIENT_CLINIC_OR_DEPARTMENT_OTHER)
Admission: RE | Disposition: A | Discharge status: Home / Self Care | Payer: Self-pay | Source: Ambulatory Visit | Attending: Orthopedic Surgery

## 2012-05-22 DIAGNOSIS — M6750 Plica syndrome, unspecified knee: Secondary | ICD-10-CM | POA: Insufficient documentation

## 2012-05-22 DIAGNOSIS — IMO0001 Reserved for inherently not codable concepts without codable children: Secondary | ICD-10-CM | POA: Insufficient documentation

## 2012-05-22 DIAGNOSIS — Z79899 Other long term (current) drug therapy: Secondary | ICD-10-CM | POA: Insufficient documentation

## 2012-05-22 DIAGNOSIS — M23329 Other meniscus derangements, posterior horn of medial meniscus, unspecified knee: Secondary | ICD-10-CM | POA: Insufficient documentation

## 2012-05-22 DIAGNOSIS — M9420 Chondromalacia, unspecified site: Secondary | ICD-10-CM | POA: Insufficient documentation

## 2012-05-22 HISTORY — PX: KNEE ARTHROSCOPY WITH MEDIAL MENISECTOMY: SHX5651

## 2012-05-22 HISTORY — DX: Fibromyalgia: M79.7

## 2012-05-22 LAB — POCT HEMOGLOBIN-HEMACUE: Hemoglobin: 12.4 g/dL (ref 12.0–15.0)

## 2012-05-22 SURGERY — ARTHROSCOPY, KNEE, WITH MEDIAL MENISCECTOMY
Anesthesia: General | Site: Knee | Laterality: Right | Wound class: Clean

## 2012-05-22 MED ORDER — SCOPOLAMINE 1 MG/3DAYS TD PT72
1.0000 | MEDICATED_PATCH | TRANSDERMAL | Status: DC
  Administered 2012-05-22: 1.5 mg via TRANSDERMAL

## 2012-05-22 MED ORDER — SODIUM CHLORIDE 0.9 % IR SOLN
Status: DC | PRN
  Administered 2012-05-22: 6000 mL

## 2012-05-22 MED ORDER — FENTANYL CITRATE 0.05 MG/ML IJ SOLN
INTRAMUSCULAR | Status: DC | PRN
  Administered 2012-05-22: 100 ug via INTRAVENOUS

## 2012-05-22 MED ORDER — PROPOFOL 10 MG/ML IV BOLUS
INTRAVENOUS | Status: DC | PRN
  Administered 2012-05-22: 200 mg via INTRAVENOUS

## 2012-05-22 MED ORDER — MIDAZOLAM HCL 2 MG/2ML IJ SOLN: 1.0000 mg | INTRAMUSCULAR | Status: DC | PRN

## 2012-05-22 MED ORDER — FENTANYL CITRATE 0.05 MG/ML IJ SOLN: 50.0000 ug | INTRAMUSCULAR | Status: DC | PRN

## 2012-05-22 MED ORDER — OXYCODONE-ACETAMINOPHEN 5-325 MG PO TABS
1.0000 | ORAL_TABLET | Freq: Once | ORAL | Status: AC | PRN
  Administered 2012-05-22: 1 via ORAL

## 2012-05-22 MED ORDER — ONDANSETRON HCL 4 MG/2ML IJ SOLN
INTRAMUSCULAR | Status: DC | PRN
  Administered 2012-05-22: 4 mg via INTRAVENOUS

## 2012-05-22 MED ORDER — LIDOCAINE HCL (CARDIAC) 20 MG/ML IV SOLN
INTRAVENOUS | Status: DC | PRN
  Administered 2012-05-22: 60 mg via INTRAVENOUS

## 2012-05-22 MED ORDER — HYDROMORPHONE HCL PF 1 MG/ML IJ SOLN
0.2500 mg | INTRAMUSCULAR | Status: DC | PRN
  Administered 2012-05-22 (×2): 0.5 mg via INTRAVENOUS
  Administered 2012-05-22: 0.25 mg via INTRAVENOUS

## 2012-05-22 MED ORDER — DEXAMETHASONE SODIUM PHOSPHATE 4 MG/ML IJ SOLN
INTRAMUSCULAR | Status: DC | PRN
  Administered 2012-05-22: 10 mg via INTRAVENOUS

## 2012-05-22 MED ORDER — OXYCODONE-ACETAMINOPHEN 5-325 MG PO TABS: 1.0000 | ORAL_TABLET | Freq: Four times a day (QID) | ORAL | Status: DC | PRN

## 2012-05-22 MED ORDER — EPINEPHRINE HCL 1 MG/ML IJ SOLN
INTRAMUSCULAR | Status: DC | PRN
  Administered 2012-05-22: 1 mg

## 2012-05-22 MED ORDER — BUPIVACAINE HCL (PF) 0.5 % IJ SOLN
INTRAMUSCULAR | Status: DC | PRN
  Administered 2012-05-22: 20 mL

## 2012-05-22 MED ORDER — MIDAZOLAM HCL 5 MG/5ML IJ SOLN
INTRAMUSCULAR | Status: DC | PRN
  Administered 2012-05-22: 1 mg via INTRAVENOUS

## 2012-05-22 MED ORDER — POVIDONE-IODINE 7.5 % EX SOLN: Freq: Once | CUTANEOUS | Status: DC

## 2012-05-22 MED ORDER — CEFAZOLIN SODIUM-DEXTROSE 2-3 GM-% IV SOLR
2.0000 g | INTRAVENOUS | Status: AC
  Administered 2012-05-22: 2 g via INTRAVENOUS

## 2012-05-22 MED ORDER — LACTATED RINGERS IV SOLN
INTRAVENOUS | Status: DC
  Administered 2012-05-22: 07:00:00 via INTRAVENOUS
  Administered 2012-05-22: 20 mL/h via INTRAVENOUS
  Administered 2012-05-22: 09:00:00 via INTRAVENOUS

## 2012-05-22 SURGICAL SUPPLY — 40 items
BANDAGE ELASTIC 6 VELCRO ST LF (GAUZE/BANDAGES/DRESSINGS) ×2 IMPLANT
BLADE 4.2CUDA (BLADE) IMPLANT
BLADE GREAT WHITE 4.2 (BLADE) ×2 IMPLANT
CANISTER OMNI JUG 16 LITER (MISCELLANEOUS) ×1 IMPLANT
CANISTER SUCTION 2500CC (MISCELLANEOUS) IMPLANT
CLOTH BEACON ORANGE TIMEOUT ST (SAFETY) ×2 IMPLANT
CUTTER MENISCUS  4.2MM (BLADE)
CUTTER MENISCUS 4.2MM (BLADE) IMPLANT
DRAPE ARTHROSCOPY W/POUCH 114 (DRAPES) ×2 IMPLANT
DRSG EMULSION OIL 3X3 NADH (GAUZE/BANDAGES/DRESSINGS) ×2 IMPLANT
DURAPREP 26ML APPLICATOR (WOUND CARE) ×2 IMPLANT
ELECT MENISCUS 165MM 90D (ELECTRODE) IMPLANT
ELECT REM PT RETURN 9FT ADLT (ELECTROSURGICAL)
ELECTRODE REM PT RTRN 9FT ADLT (ELECTROSURGICAL) IMPLANT
GLOVE BIO SURGEON STRL SZ7 (GLOVE) ×1 IMPLANT
GLOVE BIOGEL PI IND STRL 7.0 (GLOVE) IMPLANT
GLOVE BIOGEL PI IND STRL 8 (GLOVE) ×2 IMPLANT
GLOVE BIOGEL PI INDICATOR 7.0 (GLOVE) ×1
GLOVE BIOGEL PI INDICATOR 8 (GLOVE) ×2
GLOVE ECLIPSE 7.5 STRL STRAW (GLOVE) ×4 IMPLANT
GOWN BRE IMP PREV XXLGXLNG (GOWN DISPOSABLE) ×2 IMPLANT
GOWN PREVENTION PLUS XLARGE (GOWN DISPOSABLE) ×2 IMPLANT
GOWN PREVENTION PLUS XXLARGE (GOWN DISPOSABLE) ×2 IMPLANT
HOLDER KNEE FOAM BLUE (MISCELLANEOUS) ×2 IMPLANT
KNEE WRAP E Z 3 GEL PACK (MISCELLANEOUS) ×2 IMPLANT
NDL SAFETY ECLIPSE 18X1.5 (NEEDLE) IMPLANT
NEEDLE HYPO 18GX1.5 SHARP (NEEDLE) ×2
PACK ARTHROSCOPY DSU (CUSTOM PROCEDURE TRAY) ×2 IMPLANT
PACK BASIN DAY SURGERY FS (CUSTOM PROCEDURE TRAY) ×2 IMPLANT
PAD CAST 4YDX4 CTTN HI CHSV (CAST SUPPLIES) ×1 IMPLANT
PADDING CAST COTTON 4X4 STRL (CAST SUPPLIES) ×2
PENCIL BUTTON HOLSTER BLD 10FT (ELECTRODE) IMPLANT
SET ARTHROSCOPY TUBING (MISCELLANEOUS) ×2
SET ARTHROSCOPY TUBING LN (MISCELLANEOUS) ×1 IMPLANT
SPONGE GAUZE 4X4 12PLY (GAUZE/BANDAGES/DRESSINGS) ×2 IMPLANT
SUT ETHILON 4 0 PS 2 18 (SUTURE) IMPLANT
SYR 5ML LL (SYRINGE) ×2 IMPLANT
TOWEL OR 17X24 6PK STRL BLUE (TOWEL DISPOSABLE) ×2 IMPLANT
TOWEL OR NON WOVEN STRL DISP B (DISPOSABLE) ×2 IMPLANT
WATER STERILE IRR 1000ML POUR (IV SOLUTION) ×1 IMPLANT

## 2012-05-22 NOTE — Anesthesia Procedure Notes (Signed)
Procedure Name: LMA Insertion Date/Time: 05/22/2012 8:21 AM Performed by: Meyer Russel Pre-anesthesia Checklist: Patient identified, Emergency Drugs available, Suction available and Patient being monitored Patient Re-evaluated:Patient Re-evaluated prior to inductionOxygen Delivery Method: Circle System Utilized Preoxygenation: Pre-oxygenation with 100% oxygen Intubation Type: IV induction Ventilation: Mask ventilation without difficulty LMA: LMA inserted LMA Size: 4.0 Number of attempts: 1 Airway Equipment and Method: bite block Placement Confirmation: positive ETCO2 and breath sounds checked- equal and bilateral Tube secured with: Tape Dental Injury: Teeth and Oropharynx as per pre-operative assessment

## 2012-05-22 NOTE — Anesthesia Postprocedure Evaluation (Signed)
  Anesthesia Post-op Note  Patient: [name]  Procedure(s) Performed: Procedure(s): KNEE ARTHROSCOPY WITH MEDIAL MENISECTOMY and Medial Plica excision (Right)  Patient Location: PACU  Anesthesia Type:General  Level of Consciousness: awake  Airway and Oxygen Therapy: Patient Spontanous Breathing  Post-op Pain: mild  Post-op Assessment: Post-op Vital signs reviewed  Post-op Vital Signs: Reviewed  Complications: No apparent anesthesia complications

## 2012-05-22 NOTE — H&P (Signed)
PREOPERATIVE H&P  Chief Complaint: r knee pain  HPI: [name] is a 52 y.o. female who presents for evaluation of r knee pain. It has been present for greater than 6 months and has been worsening. She has failed conservative measures. Pain is rated as moderate.  Past Medical History  Diagnosis Date  . No pertinent past medical history   . Fibromyalgia   . PONV (postoperative nausea and vomiting)     used a scop patch last surg-did well   Past Surgical History  Procedure Laterality Date  . Ankle fusion  2011    right  . Cholecystectomy    . Eye surgery      rt cataract  . Dilation and curettage of uterus    . Tubal ligation    . Wrist fracture surgery  2004    left  . Endometrial ablation  2008  . Tonsillectomy    . Shoulder arthroscopy  8/13    left-DSC   History   Social History  . Marital Status: Married    Spouse Name: N/A    Number of Children: N/A  . Years of Education: N/A   Social History Main Topics  . Smoking status: Former Smoker    Quit date: 11/25/2010  . Smokeless tobacco: None  . Alcohol Use: No  . Drug Use: No  . Sexually Active: None   Other Topics Concern  . None   Social History Narrative  . None   History reviewed. No pertinent family history. Allergies  Allergen Reactions  . Sulfa Antibiotics Hives   Prior to Admission medications   Medication Sig Start Date End Date Taking? Authorizing Provider  amitriptyline (ELAVIL) 25 MG tablet Take 25 mg by mouth 2 (two) times daily.   Yes Historical Provider, MD  cholecalciferol (VITAMIN D) 1000 UNITS tablet Take 1,000 Units by mouth daily.   Yes Historical Provider, MD  cyclobenzaprine (FLEXERIL) 10 MG tablet Take 10 mg by mouth 3 (three) times daily as needed for muscle spasms.   Yes Historical Provider, MD  ferrous gluconate (FERGON) 216 MG tablet Take 216 mg by mouth every morning.   Yes Historical Provider, MD  pregabalin (LYRICA) 100 MG capsule Take 100 mg by mouth every evening.    Yes Historical Provider, MD     Positive ROS: none  All other systems have been reviewed and were otherwise negative with the exception of those mentioned in the HPI and as above.  Physical Exam: Filed Vitals:   05/22/12 0656  BP: 144/80  Pulse: 81  Temp: 98.1 F (36.7 C)  Resp: 16    General: Alert, no acute distress Cardiovascular: No pedal edema Respiratory: No cyanosis, no use of accessory musculature GI: No organomegaly, abdomen is soft and non-tender Skin: No lesions in the area of chief complaint Neurologic: Sensation intact distally Psychiatric: Patient is competent for consent with normal mood and affect Lymphatic: No axillary or cervical lymphadenopathy  MUSCULOSKELETAL: r knee: +ttp over med jt line +[place]  Assessment/Plan: MENISCAL TEAR RIGHT KNEE Plan for Procedure(s): ARTHROSCOPY KNEE  The risks benefits and alternatives were discussed with the patient including but not limited to the risks of nonoperative treatment, versus surgical intervention including infection, bleeding, nerve injury, malunion, nonunion, hardware prominence, hardware failure, need for hardware removal, blood clots, cardiopulmonary complications, morbidity, mortality, among others, and they were willing to proceed.  Predicted outcome is good, although there will be at least a six to nine month expected recovery.  Roshanda Balazs L,  MD 05/22/2012 7:37 AM

## 2012-05-22 NOTE — Anesthesia Preprocedure Evaluation (Addendum)
Anesthesia Evaluation  Patient identified by MRN, date of birth, ID band Patient awake    Reviewed: Allergy & Precautions, H&P , NPO status , Patient's Chart, lab work & pertinent test results  History of Anesthesia Complications (+) PONV  Airway Mallampati: II      Dental   Pulmonary neg pulmonary ROS,  breath sounds clear to auscultation        Cardiovascular negative cardio ROS  Rhythm:Regular Rate:Normal     Neuro/Psych    GI/Hepatic negative GI ROS, Neg liver ROS,   Endo/Other  negative endocrine ROS  Renal/GU negative Renal ROS     Musculoskeletal  (+) Fibromyalgia -  Abdominal   Peds  Hematology   Anesthesia Other Findings   Reproductive/Obstetrics                           Anesthesia Physical Anesthesia Plan  ASA: II  Anesthesia Plan: General   Post-op Pain Management:    Induction: Intravenous  Airway Management Planned: LMA  Additional Equipment:   Intra-op Plan:   Post-operative Plan: Extubation in OR  Informed Consent: I have reviewed the patients History and Physical, chart, labs and discussed the procedure including the risks, benefits and alternatives for the proposed anesthesia with the patient or authorized representative who has indicated his/her understanding and acceptance.   Dental advisory given  Plan Discussed with: CRNA and Anesthesiologist  Anesthesia Plan Comments:         Anesthesia Quick Evaluation

## 2012-05-22 NOTE — Brief Op Note (Signed)
05/22/2012  8:55 AM  PATIENT:  [name]  52 y.o. female  PRE-OPERATIVE DIAGNOSIS:  MENISCAL TEAR RIGHT KNEE  POST-OPERATIVE DIAGNOSIS:  Meniscus Tear Right Knee, Chondromalacia, Medial Plica  PROCEDURE:  Procedure(s): KNEE ARTHROSCOPY WITH MEDIAL MENISECTOMY and Medial Plica excision (Right)  SURGEON:  Surgeon(s) and Role:    * Harvie Junior, MD - Primary  PHYSICIAN ASSISTANT:   ASSISTANTS: bethune   ANESTHESIA:   general  EBL:  Total I/O In: 200 [I.V.:200] Out: -   BLOOD ADMINISTERED:none  DRAINS: none   LOCAL MEDICATIONS USED:  MARCAINE     SPECIMEN:  No Specimen  DISPOSITION OF SPECIMEN:  N/A  COUNTS:  YES  TOURNIQUET:  * No tourniquets in log *  DICTATION: .Other Dictation: Dictation Number [id number]  PLAN OF CARE: Discharge to home after PACU  PATIENT DISPOSITION:  PACU - hemodynamically stable.   Delay start of Pharmacological VTE agent (>24hrs) due to surgical blood loss or risk of bleeding: no

## 2012-05-22 NOTE — Op Note (Signed)
NAME:  [name], [name]             ACCOUNT NO.:  [id number]  MEDICAL RECORD NO.:  [id number]  LOCATION:                                 FACILITY:  PHYSICIAN:  Harvie Junior, M.D.        DATE OF BIRTH:  DATE OF PROCEDURE:  05/22/2012 DATE OF DISCHARGE:                              OPERATIVE REPORT   PREOPERATIVE DIAGNOSIS:  Medial meniscal tear.  POSTOPERATIVE DIAGNOSIS: 1. Medial meniscal tear. 2. Chondromalacia medial femoral condyle. 3. Medial shelf plica.  PROCEDURE: 1. Partial posterior medial meniscectomy with corresponding     debridement in the medial compartment. 2. Debridement of medial shelf plica.  SURGEON:  Harvie Junior, M.D.  ASSISTANT:  Marshia Ly, PA.  ANESTHESIA:  General.  BRIEF HISTORY:  [name] is a 52 year old female with a history of having had significant right knee pain.  We treated conservatively for a period of time.  After failure of all conservative care, she was taken to the operating room for operative knee arthroscopy.  She has had previous arthroscopy years ago, and felt that her symptoms were varied somewhat which she had prior to that.  She is brought to the operating room for this procedure.  PROCEDURE:  The patient was brought to the operating room and after adequate anesthesia obtained with general anesthetic, the patient was placed supine on the operating table.  The right knee was then prepped and draped in usual sterile fashion.  Following this, routine arthroscopic examination of the knee revealed there was no significant patellofemoral arthritis.  There was a large medial shelf plica blocking entrance into the medial compartment.  This was debrided.  There was an area of the medial femoral condyle, which had been irritated by this. Once we got into the medial compartment, there was a small posterior horn medial meniscal tear, which was debrided.  The medial femoral condyle had some significant chondromalacia grade 2  and some early grade 3 changes.  This was debrided with a suction shaver minimally to not go too deep in this area.  Once this was done, attention was turned to the ACL normal, lateral side normal.  At this point, the knee was copiously and thoroughly lavaged with 6 L of normal saline irrigation, suctioned dry.  The arthroscopic portals were closed with bandage.  Sterile compressive dressings applied and the patient was taken to recovery and she was noted to be in satisfactory condition. Estimated blood loss for the procedure was none.     Harvie Junior, M.D.     Ranae Plumber  D:  05/22/2012  T:  05/22/2012  Job:  [id number]

## 2012-05-22 NOTE — Transfer of Care (Signed)
Immediate Anesthesia Transfer of Care Note  Patient: [name]  Procedure(s) Performed: Procedure(s): KNEE ARTHROSCOPY WITH MEDIAL MENISECTOMY and Medial Plica excision (Right)  Patient Location: PACU  Anesthesia Type:General  Level of Consciousness: awake, alert  and oriented  Airway & Oxygen Therapy: Patient Spontanous Breathing and Patient connected to face mask oxygen  Post-op Assessment: Report given to PACU RN, Post -op Vital signs reviewed and stable and Patient moving all extremities  Post vital signs: Reviewed and stable  Complications: No apparent anesthesia complications

## 2012-05-25 ENCOUNTER — Encounter (HOSPITAL_BASED_OUTPATIENT_CLINIC_OR_DEPARTMENT_OTHER): Payer: Self-pay | Admitting: Orthopedic Surgery

## 2012-06-19 ENCOUNTER — Other Ambulatory Visit (HOSPITAL_COMMUNITY): Payer: Self-pay | Admitting: Internal Medicine

## 2012-06-19 ENCOUNTER — Ambulatory Visit (HOSPITAL_COMMUNITY)
Admission: RE | Admit: 2012-06-19 | Discharge: 2012-06-19 | Disposition: A | Discharge status: Home / Self Care | Payer: 59 | Source: Ambulatory Visit | Attending: Internal Medicine | Admitting: Internal Medicine

## 2012-06-19 DIAGNOSIS — I10 Essential (primary) hypertension: Secondary | ICD-10-CM

## 2012-06-19 DIAGNOSIS — Z0000 Encounter for general adult medical examination without abnormal findings: Secondary | ICD-10-CM | POA: Insufficient documentation

## 2012-06-25 LAB — HM MAMMOGRAPHY

## 2013-01-13 ENCOUNTER — Other Ambulatory Visit: Payer: Self-pay | Admitting: Orthopedic Surgery

## 2013-01-13 DIAGNOSIS — M545 Low back pain, unspecified: Secondary | ICD-10-CM

## 2013-01-24 ENCOUNTER — Ambulatory Visit
Admission: RE | Admit: 2013-01-24 | Discharge: 2013-01-24 | Disposition: A | Discharge status: Home / Self Care | Payer: 59 | Source: Ambulatory Visit | Attending: Orthopedic Surgery | Admitting: Orthopedic Surgery

## 2013-01-24 DIAGNOSIS — M545 Low back pain, unspecified: Secondary | ICD-10-CM

## 2013-02-02 ENCOUNTER — Encounter: Payer: Self-pay | Admitting: Internal Medicine

## 2013-02-02 DIAGNOSIS — F3181 Bipolar II disorder: Secondary | ICD-10-CM | POA: Insufficient documentation

## 2013-02-02 DIAGNOSIS — E785 Hyperlipidemia, unspecified: Secondary | ICD-10-CM

## 2013-02-02 DIAGNOSIS — R0989 Other specified symptoms and signs involving the circulatory and respiratory systems: Secondary | ICD-10-CM | POA: Insufficient documentation

## 2013-02-02 DIAGNOSIS — M797 Fibromyalgia: Secondary | ICD-10-CM

## 2013-02-02 DIAGNOSIS — I10 Essential (primary) hypertension: Secondary | ICD-10-CM

## 2013-02-02 DIAGNOSIS — J449 Chronic obstructive pulmonary disease, unspecified: Secondary | ICD-10-CM | POA: Insufficient documentation

## 2013-02-02 DIAGNOSIS — K219 Gastro-esophageal reflux disease without esophagitis: Secondary | ICD-10-CM | POA: Insufficient documentation

## 2013-02-02 DIAGNOSIS — R7303 Prediabetes: Secondary | ICD-10-CM

## 2013-02-12 ENCOUNTER — Encounter: Payer: Self-pay | Admitting: Physician Assistant

## 2013-02-12 ENCOUNTER — Ambulatory Visit: Payer: 59 | Admitting: Physician Assistant

## 2013-02-12 VITALS — BP 122/70 | HR 64 | Temp 98.2°F | Resp 16 | Wt 201.0 lb

## 2013-02-12 DIAGNOSIS — G2581 Restless legs syndrome: Secondary | ICD-10-CM

## 2013-02-12 DIAGNOSIS — E079 Disorder of thyroid, unspecified: Secondary | ICD-10-CM

## 2013-02-12 DIAGNOSIS — E039 Hypothyroidism, unspecified: Secondary | ICD-10-CM | POA: Insufficient documentation

## 2013-02-12 LAB — TSH: TSH: 1.961 u[IU]/mL (ref 0.350–4.500)

## 2013-02-12 MED ORDER — CLONAZEPAM 2 MG PO TABS: 2.0000 mg | ORAL_TABLET | Freq: Every day | ORAL | Status: DC

## 2013-02-12 NOTE — Progress Notes (Signed)
HPI Patient presents for a one month follow up. Last visit patient's TSH was 12.7 with a goal of 2 and she was started on Levothyroxine 75 mcg daily. She states she is feeling better with energy. She also started on Klonopin 1 mg QHS for RLS and states that she is sleeping much better and her legs are not waking her up anymore.   Allergies:  Allergies  Allergen Reactions  . Ppd [Tuberculin Purified Protein Derivative]   . Sulfa Antibiotics Hives       Medication List       This list is accurate as of: 02/12/13  9:56 AM.  Always use your most recent med list.               amitriptyline 25 MG tablet  Commonly known as:  ELAVIL  Take 25 mg by mouth 2 (two) times daily.     amphetamine-dextroamphetamine 10 MG tablet  Commonly known as:  ADDERALL  Take 10 mg by mouth 2 (two) times daily.     cholecalciferol 1000 UNITS tablet  Commonly known as:  VITAMIN D  Take by mouth daily. 7000 units     clonazePAM 1 MG tablet  Commonly known as:  KLONOPIN  Take 1 mg by mouth 2 (two) times daily as needed for anxiety.     cyclobenzaprine 10 MG tablet  Commonly known as:  FLEXERIL  Take 10 mg by mouth 3 (three) times daily as needed for muscle spasms.     ferrous gluconate 216 MG tablet  Commonly known as:  FERGON  Take 216 mg by mouth every morning.     levothyroxine 75 MCG tablet  Commonly known as:  SYNTHROID, LEVOTHROID  Take 75 mcg by mouth daily before breakfast.     magnesium oxide 400 MG tablet  Commonly known as:  MAG-OX  Take 400 mg by mouth daily.     oxyCODONE-acetaminophen 5-325 MG per tablet  Commonly known as:  PERCOCET/ROXICET  Take 1-2 tablets by mouth every 6 (six) hours as needed for pain.     pregabalin 100 MG capsule  Commonly known as:  LYRICA  Take 75 mg by mouth 2 (two) times daily.        ROS: all negative expect above.   Physical: Filed Vitals:   02/12/13 0943  BP: 122/70  Pulse: 64  Temp: 98.2 F (36.8 C)  Resp: 16   General  Appearance: Well nourished, in no apparent distress. Eyes: PERRLA, EOMs. Sinuses: No Frontal/maxillary tenderness ENT/Mouth: Ext aud canals clear, normal light reflex with TMs without erythema, bulging. Post pharynx without erythema, swelling, exudate.  Respiratory: CTAB Cardio: RRR, no murmurs, rubs or gallops. Peripheral pulses brisk and equal bilaterally, without edema. No aortic or femoral bruits. Abdomen: Flat, soft, with bowl sounds. Nontender, no guarding, rebound. Lymphatics: Non tender without lymphadenopathy.  Musculoskeletal: Full ROM all peripheral extremities, 5/5 strength, and normal gait. Skin: Warm, dry without rashes, lesions, ecchymosis.  Neuro: Cranial nerves intact, reflexes equal bilaterally. Normal muscle tone, no cerebellar symptoms. Sensation intact.  Pysch: Awake and oriented X 3, normal affect, Insight and Judgment appropriate.   Assessment and Plan:  Problem List Items Addressed This Visit     Endocrine   Thyroid disease - Primary   Relevant Medications      levothyroxine (SYNTHROID, LEVOTHROID) 75 MCG tablet   Other Relevant Orders      TSH     Other   Restless leg syndrome    Klonopin 2mg  #30  3 refills she will take 1/2 pill QHS.

## 2013-02-12 NOTE — Patient Instructions (Signed)

## 2013-02-15 ENCOUNTER — Telehealth: Payer: Self-pay

## 2013-02-15 NOTE — Telephone Encounter (Signed)
LMOM with lab results advised to return call with any questions

## 2013-02-15 NOTE — Telephone Encounter (Signed)
Message copied by Linwood Dibbles on Mon Feb 15, 2013  9:19 AM ------      Message from: Dumont R      Created: Sun Feb 14, 2013  7:45 PM       Continue on the same dose of levothyroxine and we will recheck it at your next visit. ------

## 2013-02-17 ENCOUNTER — Other Ambulatory Visit: Payer: Self-pay | Admitting: Orthopedic Surgery

## 2013-02-17 DIAGNOSIS — M8700 Idiopathic aseptic necrosis of unspecified bone: Secondary | ICD-10-CM

## 2013-02-22 ENCOUNTER — Ambulatory Visit
Admission: RE | Admit: 2013-02-22 | Discharge: 2013-02-22 | Disposition: A | Discharge status: Home / Self Care | Payer: 59 | Source: Ambulatory Visit | Attending: Orthopedic Surgery | Admitting: Orthopedic Surgery

## 2013-02-22 DIAGNOSIS — M8700 Idiopathic aseptic necrosis of unspecified bone: Secondary | ICD-10-CM

## 2013-03-08 ENCOUNTER — Telehealth: Payer: Self-pay | Admitting: *Deleted

## 2013-03-08 MED ORDER — ALPRAZOLAM 1 MG PO TABS: ORAL_TABLET | ORAL | Status: DC

## 2013-03-08 NOTE — Addendum Note (Signed)
Addended by: Quentin Mulling R on: 03/08/2013 12:34 PM   Modules accepted: Orders

## 2013-03-08 NOTE — Telephone Encounter (Signed)
PTS DAD PASSED AWAY, PTS DAUGHTER AMANDA IS CALLING ASKING IS THERE ANY WAY WE CAN CALL IN A RX? TO HELP PT BE ABLE TO SLEEP & GET THROUGH THIS TIME  * PLEASE ADVISE

## 2013-03-08 NOTE — Telephone Encounter (Signed)
Patient aware rx called in and not to use with Klonopin

## 2013-03-22 ENCOUNTER — Other Ambulatory Visit: Payer: Self-pay | Admitting: Physician Assistant

## 2013-03-22 MED ORDER — AMPHETAMINE-DEXTROAMPHETAMINE 10 MG PO TABS: 10.0000 mg | ORAL_TABLET | Freq: Two times a day (BID) | ORAL | Status: DC

## 2013-03-22 MED ORDER — CLONAZEPAM 1 MG PO TABS: ORAL_TABLET | ORAL | Status: DC

## 2013-03-23 ENCOUNTER — Other Ambulatory Visit: Payer: Self-pay | Admitting: Physician Assistant

## 2013-03-23 ENCOUNTER — Other Ambulatory Visit: Payer: Self-pay | Admitting: Internal Medicine

## 2013-04-08 DIAGNOSIS — C801 Malignant (primary) neoplasm, unspecified: Secondary | ICD-10-CM

## 2013-04-08 HISTORY — DX: Malignant (primary) neoplasm, unspecified: C80.1

## 2013-06-22 ENCOUNTER — Encounter: Payer: Self-pay | Admitting: Physician Assistant

## 2013-06-29 ENCOUNTER — Encounter: Payer: Self-pay | Admitting: Physician Assistant

## 2013-06-29 ENCOUNTER — Other Ambulatory Visit (HOSPITAL_COMMUNITY)
Admission: RE | Admit: 2013-06-29 | Discharge: 2013-06-29 | Disposition: A | Discharge status: Home / Self Care | Payer: 59 | Source: Ambulatory Visit | Attending: Physician Assistant | Admitting: Physician Assistant

## 2013-06-29 ENCOUNTER — Ambulatory Visit (INDEPENDENT_AMBULATORY_CARE_PROVIDER_SITE_OTHER): Payer: 59 | Admitting: Physician Assistant

## 2013-06-29 VITALS — BP 122/72 | HR 64 | Temp 98.1°F | Resp 16 | Ht 65.0 in | Wt 201.0 lb

## 2013-06-29 DIAGNOSIS — IMO0002 Reserved for concepts with insufficient information to code with codable children: Secondary | ICD-10-CM

## 2013-06-29 DIAGNOSIS — N3000 Acute cystitis without hematuria: Secondary | ICD-10-CM

## 2013-06-29 DIAGNOSIS — E2839 Other primary ovarian failure: Secondary | ICD-10-CM

## 2013-06-29 DIAGNOSIS — F3181 Bipolar II disorder: Secondary | ICD-10-CM

## 2013-06-29 DIAGNOSIS — Z124 Encounter for screening for malignant neoplasm of cervix: Secondary | ICD-10-CM

## 2013-06-29 DIAGNOSIS — M545 Low back pain, unspecified: Secondary | ICD-10-CM

## 2013-06-29 DIAGNOSIS — R7303 Prediabetes: Secondary | ICD-10-CM

## 2013-06-29 DIAGNOSIS — Z0000 Encounter for general adult medical examination without abnormal findings: Secondary | ICD-10-CM

## 2013-06-29 DIAGNOSIS — I10 Essential (primary) hypertension: Secondary | ICD-10-CM

## 2013-06-29 DIAGNOSIS — Z01419 Encounter for gynecological examination (general) (routine) without abnormal findings: Secondary | ICD-10-CM | POA: Insufficient documentation

## 2013-06-29 DIAGNOSIS — Z1151 Encounter for screening for human papillomavirus (HPV): Secondary | ICD-10-CM | POA: Insufficient documentation

## 2013-06-29 LAB — CBC WITH DIFFERENTIAL/PLATELET
Basophils Absolute: 0 10*3/uL (ref 0.0–0.1)
Basophils Relative: 0 % (ref 0–1)
Eosinophils Absolute: 0.3 10*3/uL (ref 0.0–0.7)
Eosinophils Relative: 3 % (ref 0–5)
HCT: 41.3 % (ref 36.0–46.0)
Hemoglobin: 14.3 g/dL (ref 12.0–15.0)
Lymphocytes Relative: 34 % (ref 12–46)
Lymphs Abs: 3.6 10*3/uL (ref 0.7–4.0)
MCH: 29.5 pg (ref 26.0–34.0)
MCHC: 34.6 g/dL (ref 30.0–36.0)
MCV: 85.2 fL (ref 78.0–100.0)
Monocytes Absolute: 0.5 10*3/uL (ref 0.1–1.0)
Monocytes Relative: 5 % (ref 3–12)
Neutro Abs: 6.1 10*3/uL (ref 1.7–7.7)
Neutrophils Relative %: 58 % (ref 43–77)
Platelets: 304 10*3/uL (ref 150–400)
RBC: 4.85 MIL/uL (ref 3.87–5.11)
RDW: 13.5 % (ref 11.5–15.5)
WBC: 10.5 10*3/uL (ref 4.0–10.5)

## 2013-06-29 LAB — HEMOGLOBIN A1C
Hgb A1c MFr Bld: 6.1 % — ABNORMAL HIGH (ref <5.7)
Mean Plasma Glucose: 128 mg/dL — ABNORMAL HIGH (ref <117)

## 2013-06-29 MED ORDER — ACYCLOVIR 400 MG PO TABS: 400.0000 mg | ORAL_TABLET | Freq: Every day | ORAL | Status: AC

## 2013-06-29 MED ORDER — AMPHETAMINE-DEXTROAMPHETAMINE 20 MG PO TABS: 10.0000 mg | ORAL_TABLET | Freq: Two times a day (BID) | ORAL | Status: DC

## 2013-06-29 MED ORDER — CLONAZEPAM 2 MG PO TABS: ORAL_TABLET | ORAL | Status: DC

## 2013-06-29 NOTE — Patient Instructions (Signed)
Lateral Plantar Nerve Entrapment with Rehab Lateral plantar nerve entrapment is a condition that affects the nervous system and causes pain in the heel. The condition is caused by pressure placed on the lateral plantar nerve. This stimulates (innervates) a small muscle of the foot and the lining of the heel bone by ligament-like tissue (fascia) and muscle or bones. This condition does not often cause a lack of feeling in the foot. SYMPTOMS   Pain, tenderness, or burning on the inner part of the heel.  Pain that may spread up the ankle, and to other parts on the bottom of the foot.  Pain that gets worse when standing, running, or jumping, although this may also occur at night. CAUSES  Increased pressure placed on the lateral plantar nerve by structures that surround it causes lateral plantar nerve entrapment. Pinching of the nerve by connective tissue (fascia) and muscle or bone most commonly causes the increase in pressure. Increased pressure may also be caused by inflammation of the connective tissue in the sole of the foot (plantar fascia). RISK INCREASES WITH:  Sports that require standing on the toes often or for a prolonged period (sprinting, ballet, figure skating).  Shoes with minimal padding and loss of shock absorption.  Looseness of the joints of the foot, flat feet, or stiffness of the big toe (hallux rigidus).  New arch supports (orthotics) that have high arches.  Medical conditions, including diabetes mellitus and thyroid disorders. PREVENTION  Warm up and stretch properly before activity.  Maintain physical fitness:  Strength, flexibility, and endurance.  Cardiovascular fitness.  Wear properly fitted and padded equipment (shoes and orthotics).  Wear arch supports and heel cushions. PROGNOSIS  If treated properly, lateral plantar nerve entrapment usually goes away with non-surgical treatment. Lateral plantar nerve entrapment may also go away on its own  (spontaneously).  RELATED COMPLICATIONS Persistent pain in the foot or ankle and inability to compete, due to pain.  TREATMENT Treatment first involves resting from any activity that makes your symptoms worse. The use of ice and medicine will help reduce pain and inflammation. Orthotics (arch supports and heel raises) are helpful for reducing symptoms. It is important to incorporate cross-training activities into your training schedule. The use of strengthening and stretching exercises may help reduce pain with activity, especially exercises focused on the Achilles tendon. These exercises may be performed at home or with a therapist. If symptoms do not go away after at least one year of non-surgical treatment, surgery may be needed, to free the entrapped nerve. Recovery after surgery usually lasts around 6 weeks.  MEDICATION   If pain medicine is needed, nonsteroidal anti-inflammatory medicines (aspirin and ibuprofen), or other minor pain relievers (acetaminophen), are often advised.  Do not take pain medicine for 7 days before surgery.  Prescription pain relievers may be given, if your caregiver thinks they are needed. Use only as directed and only as much as you need. HEAT AND COLD  Cold treatment (icing) should be applied for 10 to 15 minutes every 2 to 3 hours for inflammation and pain, and immediately after activity that aggravates your symptoms. Use ice packs or an ice massage.  Heat treatment may be used before performing stretching and strengthening activities prescribed by your caregiver, physical therapist, or athletic trainer. Use a heat pack or a warm water soak. SEEK MEDICAL CARE IF:   Symptoms get worse or do not improve in 2 weeks, despite treatment  New, unexplained symptoms develop. (Drugs used in treatment may produce side  effects.) EXERCISES  RANGE OF MOTION (ROM) AND STRETCHING EXERCISES - Lateral Plantar Nerve Entrapment These exercises may help you when beginning to  rehabilitate your injury. Your symptoms may resolve with or without further involvement from your physician, physical therapist or athletic trainer. While completing these exercises, remember:   Restoring tissue flexibility helps normal motion to return to the joints. This allows healthier, less painful movement and activity.  An effective stretch should be held for at least 30 seconds.  A stretch should never be painful. You should only feel a gentle lengthening or release in the stretched tissue. RANGE OF MOTION - Ankle Inversion   Sit with your right / left ankle crossed over your opposite knee.  Grip your foot with your opposite hand, placing your thumb on the bottom of your foot and your fingers across the top of your foot.  Gently pull your foot so the smallest toe comes toward you and your thumb pushes the inside of the ball of your foot away from you.  You should feel a gentle stretch on the outside of your ankle. Hold the stretch for __________ seconds. Repeat __________ times. Complete this exercise __________ times per day.  RANGE OF MOTION - Toe Extension, Flexion  Sit with your right / left leg crossed over your opposite knee.  Grasp your toes and gently pull them back toward the top of your foot. You should feel a stretch on the bottom of your toes and foot.  Hold this stretch for __________ seconds.  Now, gently pull your toes toward the bottom of your foot. You should feel a stretch on the top of your toes and foot.  Hold this stretch for __________ seconds. Repeat __________ times. Complete this stretch __________ times per day.  STRETCH  Hamstrings, Supine   Lie on your back. Loop a belt or towel over the ball of your right / left foot.  Straighten your right / leftknee and slowly pull on the belt to raise your leg. Do not allow the right / left knee to bend. Keep your opposite leg flat on the floor.  Raise the leg until you feel a gentle stretch behind your right  / leftknee or thigh. Hold this position for __________ seconds. Repeat __________ times. Complete this stretch __________ times per day.  STRETCH  Gastroc, Standing   Place your hands on a wall.  Extend your right / left leg behind you, keeping the front knee somewhat bent.  Slightly point your toes inward on your back foot.  Keeping your right / leftheel on the floor and your knee straight, shift your weight toward the wall, not allowing your back to arch.  You should feel a gentle stretch in the right / leftcalf. Hold this position for __________ seconds. Repeat __________ times. Complete this stretch __________ times per day. STRETCH  Soleus, Standing   Place your hands on a wall.  Extend your right / left leg behind you, keeping the other knee somewhat bent.  Slightly point your toes inward on your back foot.  Keep your right / leftheel on the floor, bend your back knee, and slightly shift your weight over the back leg so that you feel a gentle stretch deep in your back calf.  Hold this position for __________ seconds. Repeat __________ times. Complete this stretch __________ times per day. STRETCH  Gastrocsoleus, Standing  Note: This exercise can place a lot of stress on your foot and ankle. Please complete this exercise only if specifically  instructed by your caregiver.   Place the ball of yourright / left foot on a step, keeping your other foot firmly on the same step.  Hold on to the wall or a rail for balance.  Slowly lift your other foot, allowing your body weight to press your heel down over the edge of the step.  You should feel a stretch in your right / left calf.  Hold this position for __________ seconds. Repeat this exercise with a slight bend in your right / left knee.  Repeat __________ times. Complete this stretch __________ times per day.  STRETCH - Hamstrings, Standing  Stand or sit and extend your right / left leg, placing your foot on a chair or foot  stool.  Keep a slight arch in your lower back and your hips straight forward.  Lead with your chest and lean forward at the waist, until you feel a gentle stretch in the back of your right / left knee or thigh. (When done correctly, this exercise requires leaning only a small distance.)  Hold this position for __________ seconds. Repeat __________ times. Complete this stretch __________ times per day. MOBILIZATION EXERCISES - Lateral Plantar Nerve Entrapment Mobilization exercises help trapped nerves glide freely. When nerves have extra pressure on them or get anchored down by surrounding tissues, they can cause pain, numbness or tingling. When completing a mobilization exercise, remember:  Nerves are very sensitive tissue. They must be mobilized very gently. Never force a motion and do not push through discomfort.  Mobilize nerves slowly.  Nerves can be very long. Be sure to position all of your body parts exactly as described. MOBILIZATION - Nerve Root   Sit on a firm surface that is high enough for yourright / left foot to swing freely. You may place a folded towel under your right / left thigh, if helpful.  Sit with a rounded or slouched back. Drop your head forward.  Keeping your right / left foot relaxed, slowly straighten yourright / left knee until it is fully extended or you feel a slight pull behind your knee or calf.  If you do not feel a slight pull, slowly draw your foot and toes toward you.  Hold for __________ seconds. Release the tension in your knee and ankle slowly. Repeat __________ times. Complete this exercise __________ times per day. STRENGTHENING EXERCISES - Lateral Plantar Nerve Entrapment These exercises may help you when beginning to rehabilitate your injury. They may resolve your symptoms with or without further involvement from your physician, physical therapist or athletic trainer. While completing these exercises, remember:   Muscles can gain both the  endurance and the strength needed for everyday activities through controlled exercises.  Complete these exercises as instructed by your physician, physical therapist or athletic trainer. Increase the resistance and repetitions only as guided. STRENGTH - Towel Curls  Sit in a chair, on a non-carpeted surface.  Place your foot on a towel, keeping your heel on the floor.  Pull the towel toward your heel only by curling your toes. Keep your heel on the floor.  If instructed by your physician, physical therapist or athletic trainer,you may add weight at the end of the towel. Repeat __________ times. Complete this exercise __________ times per day. STRENGTH - Ankle Eversion   Secure one end of a rubber exercise band or tubing to a fixed object (table, pole). Loop the other end around your foot, just before your toes.  Place your fists between your knees. This will  focus your strengthening at your ankle.  Drawing the band across your opposite foot, away from the pole, slowly, pull your little toe out and up. Make sure the band is positioned to resist the entire motion.  Hold this position for __________ seconds.  Have your muscles resist the band, as it slowly pulls your foot back to the starting position. Repeat __________ times. Complete this exercise __________ times per day.  STRENGTH - Ankle Inversion   Secure one end of a rubber exercise band or tubing to a fixed object (table, pole). Loop the other end around your foot, just before your toes.  Place your fists between your knees. This will focus your strengthening at your ankle.  Slowly, pull your big toe up and in, making sure the band is positioned to resist the entire motion.  Hold this position for __________ seconds.  Have your muscles resist the band, as it slowly pulls your foot back to the starting position. Repeat __________ times. Complete this exercises __________ times per day.  Document Released: 03/25/2005 Document  Revised: 06/17/2011 Document Reviewed: 07/07/2008 [facility] Patient Information 2014 [place], [place].

## 2013-06-29 NOTE — Progress Notes (Signed)
Complete Physical HPI 53 y.o. female  presents for a complete physical. Her blood pressure has been controlled at home, today their BP is BP: 122/72 mmHg She does not workout. She denies chest pain, shortness of breath, dizziness.  She is not on cholesterol medication and denies myalgias. Her cholesterol is at goal. The cholesterol last visit was: LDL 112  She has not been working on diet and exercise for prediabetes, and denies hyperglycemia, nausea, polydipsia and polyuria. Last A1C in the office was: 5.9 Patient is on Vitamin D supplement.   She is on thyroid medication. Her medication was not changed last visit. Patient denies diarrhea, heat / cold intolerance and palpitations.  Lab Results  Component Value Date   TSH 1.961 02/12/2013  She has fibromyalgia and RLS- she states the klonopin helps her sleep and helps her mood, her adderall helps with energy/brain fog, the lyrica has helped her pain.  Her back has been hurting, she fell and broke her sacrum.   Current Medications:  Current Outpatient Prescriptions on File Prior to Visit  Medication Sig Dispense Refill  . amphetamine-dextroamphetamine (ADDERALL) 10 MG tablet Take 1 tablet (10 mg total) by mouth 2 (two) times daily.  60 tablet  0  . cholecalciferol (VITAMIN D) 1000 UNITS tablet Take by mouth daily. 7000 units      . clonazePAM (KLONOPIN) 1 MG tablet 1 pill QHS for restless legs.  30 tablet  4  . levothyroxine (SYNTHROID, LEVOTHROID) 75 MCG tablet TAKE ONE TABLET BY MOUTH ONCE DAILY  30 tablet  2  . pregabalin (LYRICA) 100 MG capsule Take 75 mg by mouth 2 (two) times daily.       . pregabalin (LYRICA) 200 MG capsule Take one tablet 3 times daily as needed  90 capsule  0   No current facility-administered medications on file prior to visit.   Health Maintenance:   Immunization History  Administered Date(s) Administered  . Tdap 06/19/2012    Tetanus: 2014 Pneumovax: n/a Flu vaccine: 01/2013 Zostavax: N/A Pap: 2012  neg MGM: 06/2012 DEXA: N/A- broke sacrum while falling, last menses 2006, former smoker.  Colonoscopy: 06/2011 7 polyps Dr. Deatra Ina EGD: N/A  Allergies:  Allergies  Allergen Reactions  . Ppd [Tuberculin Purified Protein Derivative]   . Sulfa Antibiotics Hives   Medical History:  Past Medical History  Diagnosis Date  . No pertinent past medical history   . Fibromyalgia   . PONV (postoperative nausea and vomiting)     used a scop patch last surg-did well  . Hypertension   . Hyperlipidemia   . COPD (chronic obstructive pulmonary disease)   . GERD (gastroesophageal reflux disease)   . Prediabetes   . Cancer     melanoma  . Bipolar 2 disorder, major depressive episode   . Thyroid disease   . Restless leg syndrome    Surgical History:  Past Surgical History  Procedure Laterality Date  . Ankle fusion  2011    right  . Cholecystectomy    . Eye surgery      rt cataract  . Dilation and curettage of uterus    . Tubal ligation    . Wrist fracture surgery  2004    left  . Endometrial ablation  2008  . Tonsillectomy    . Shoulder arthroscopy  8/13    left-DSC  . Knee arthroscopy with medial menisectomy Right 05/22/2012    Procedure: KNEE ARTHROSCOPY WITH MEDIAL MENISECTOMY and Medial Plica excision;  Surgeon: Karen Chafe  Berenice Primas, MD;  Location: [facility];  Service: Orthopedics;  Laterality: Right;   Family History:  Family History  Problem Relation Age of Onset  . Cancer Mother     Lung and stomach   . Heart disease Father   . Hypertension Father   . Stroke Father   . Heart failure Paternal Grandfather   . Cancer Paternal Grandfather     colon cancer   Social History:  History  Substance Use Topics  . Smoking status: Former Smoker -- 1.00 packs/day for 30 years    Quit date: 11/25/2010  . Smokeless tobacco: Never Used  . Alcohol Use: No     Review of Systems: [X]  = complains of  [ ]  = denies  General: Fatigue [ ]  Fever [ ]  Chills [ ]  Weakness [ ]    Insomnia [ ] Weight change [ ]  Night sweats [ ]   Change in appetite [ ]  Eyes: Redness [ ]  Blurred vision [ ]  Diplopia [ ]  Discharge [ ]   ENT: Congestion [ ]  Sinus Pain [ ]  Post Nasal Drip [ ]  Sore Throat [ ]  Earache [ ]  hearing loss [ ]  Tinnitus [ ]  Snoring [ ]   Cardiac: Chest pain/pressure [ ]  SOB [ ]  Orthopnea [ ]   Palpitations [ ]   Paroxysmal nocturnal dyspnea[ ]  Claudication [ ]  Edema [ ]   Pulmonary: Cough Valu.Nieves ] Wheezing[ ]   SOB [ ]   Pleurisy [ ]   GI: Nausea [ ]  Vomiting[ ]  Dysphagia[ ]  Heartburn[ ]  Abdominal pain [ ]  Constipation [ ] ; Diarrhea [ ]  BRBPR [ ]  Melena[ ]  Bloating [ ]  Hemorrhoids [ ]   GU: Hematuria[ ]  Dysuria [ ]  Nocturia[ ]  Urgency [ ]   Hesitancy [ ]  Discharge [ ]  Frequency [ ]   Breast:  Breast lumps [ ]   nipple discharge [ ]    Neuro: Headaches[ ]  Vertigo[ ]  Paresthesias[ ]  Spasm [ ]  Speech changes [ ]  Incoordination [ ]   Ortho: Arthritis Valu.Nieves ] Joint pain Valu.Nieves ] Muscle pain Valu.Nieves ] Joint swelling [ ]  Back Pain [ ]  Skin:  Rash [ ]   Pruritis [ ]  Change in skin lesion [ ]   Psych: Depression[ ]  Anxiety[ ]  Confusion [ ]  Memory loss [ ]   Heme/Lypmh: Bleeding [ ]  Bruising [ ]  Enlarged lymph nodes [ ]   Endocrine: Visual blurring [ ]  Paresthesia [ ]  Polyuria [ ]  Polydypsea [ ]    Heat/cold intolerance [ ]  Hypoglycemia [ ]   Physical Exam: Estimated body mass index is 33.45 kg/(m^2) as calculated from the following:   Height as of this encounter: 5\' 5"  (1.651 m).   Weight as of this encounter: 201 lb (91.173 kg). BP 122/72  Pulse 64  Temp(Src) 98.1 F (36.7 C)  Resp 16  Ht 5\' 5"  (1.651 m)  Wt 201 lb (91.173 kg)  BMI 33.45 kg/m2 General Appearance: Well nourished, in no apparent distress. Eyes: PERRLA, EOMs, conjunctiva no swelling or erythema, normal fundi and vessels. Sinuses: No Frontal/maxillary tenderness ENT/Mouth: Ext aud canals clear, normal light reflex with TMs without erythema, bulging.  Good dentition. No erythema, swelling, or exudate on post pharynx. Tonsils not swollen or  erythematous. Hearing normal.  Neck: Supple, thyroid normal. No bruits Respiratory: Respiratory effort normal, BS equal bilaterally without rales, rhonchi, wheezing or stridor. Cardio: RRR without murmurs, rubs or gallops. Brisk peripheral pulses without edema.  Chest: symmetric, with normal excursions and percussion. Breasts: Symmetric, without lumps, nipple discharge, retractions. Abdomen: Soft, obese, +BS. Non tender, no guarding, rebound, hernias, masses, or  organomegaly. .  Lymphatics: Non tender without lymphadenopathy.  Genitourinary: normal external genitalia, vulva, vagina, cervix, uterus and adnexa, PAP: Pap smear done today, grade 1-2 cytocele. Musculoskeletal: Full ROM all peripheral extremities,5/5 strength, and normal gait. Left ankle with swelling, sensation intact.  Skin: Warm, dry without rashes, lesions, ecchymosis.  Neuro: Cranial nerves intact, reflexes equal bilaterally. Normal muscle tone, no cerebellar symptoms.  Psych: Awake and oriented X 3, normal affect, Insight and Judgment appropriate.   EKG: WNL no changes.  Assessment and Plan: Fibromyalgia- controlled  Hypertension- at goal, continue meds  Hyperlipidemia- check labs  COPD (chronic obstructive pulmonary disease)- last CXR good in 2014, not smoking anymore  GERD (gastroesophageal reflux disease)- continue meds  Prediabetes- Discussed general issues about diabetes pathophysiology and management., Educational material distributed., Suggested low cholesterol diet., Encouraged aerobic exercise., Discussed foot care., Reminded to get yearly retinal exam.  Cancer, melanoma- continue to follow derm  Bipolar 2 disorder, major depressive episode- continue medications the same  Thyroid disease--check TSH level, continue medications the same.   Restless leg syndrome- continue klonopin  Pap- sent off Cystocele versus infection- check UTI, refer to urologist DEXA schedule with MGM- with risk factors, estrogen def, recent  fracture, and smoking history get DEXA Back pain- stretches given Left lateral foot pain-? From fusion vs lateral nerve impingement- exercisies given Obese- Obesity with co morbidities- long discussion about weight loss, diet, and exercise  OVER 40 minutes of exam, counseling, chart review, referral performed Discussed med's effects and SE's. Screening labs and tests as requested with regular follow-up as recommended.   Vicie Mutters 3:23 PM

## 2013-06-30 LAB — URINALYSIS, ROUTINE W REFLEX MICROSCOPIC
Bilirubin Urine: NEGATIVE
Glucose, UA: NEGATIVE mg/dL
Hgb urine dipstick: NEGATIVE
Ketones, ur: NEGATIVE mg/dL
Leukocytes, UA: NEGATIVE
Nitrite: NEGATIVE
Protein, ur: NEGATIVE mg/dL
Specific Gravity, Urine: 1.022 (ref 1.005–1.030)
Urobilinogen, UA: 0.2 mg/dL (ref 0.0–1.0)
pH: 7 (ref 5.0–8.0)

## 2013-06-30 LAB — IRON AND TIBC
%SAT: 15 % — ABNORMAL LOW (ref 20–55)
Iron: 46 ug/dL (ref 42–145)
TIBC: 312 ug/dL (ref 250–470)
UIBC: 266 ug/dL (ref 125–400)

## 2013-06-30 LAB — LIPID PANEL
Cholesterol: 176 mg/dL (ref 0–200)
HDL: 58 mg/dL (ref >39)
LDL Cholesterol: 91 mg/dL (ref 0–99)
Total CHOL/HDL Ratio: 3 Ratio
Triglycerides: 137 mg/dL (ref <150)
VLDL: 27 mg/dL (ref 0–40)

## 2013-06-30 LAB — MICROALBUMIN / CREATININE URINE RATIO
Creatinine, Urine: 99.9 mg/dL
Microalb Creat Ratio: 5 mg/g (ref 0.0–30.0)
Microalb, Ur: 0.5 mg/dL (ref 0.00–1.89)

## 2013-06-30 LAB — HEPATIC FUNCTION PANEL
ALT: 34 U/L (ref 0–35)
AST: 23 U/L (ref 0–37)
Albumin: 4.5 g/dL (ref 3.5–5.2)
Alkaline Phosphatase: 74 U/L (ref 39–117)
Bilirubin, Direct: 0.1 mg/dL (ref 0.0–0.3)
Indirect Bilirubin: 0.2 mg/dL (ref 0.2–1.2)
Total Bilirubin: 0.3 mg/dL (ref 0.2–1.2)
Total Protein: 6.8 g/dL (ref 6.0–8.3)

## 2013-06-30 LAB — BASIC METABOLIC PANEL WITH GFR
BUN: 15 mg/dL (ref 6–23)
CO2: 25 mEq/L (ref 19–32)
Calcium: 9.6 mg/dL (ref 8.4–10.5)
Chloride: 102 mEq/L (ref 96–112)
Creat: 0.63 mg/dL (ref 0.50–1.10)
GFR, Est African American: >89 mL/min
GFR, Est Non African American: >89 mL/min
Glucose, Bld: 88 mg/dL (ref 70–99)
Potassium: 4.3 mEq/L (ref 3.5–5.3)
Sodium: 139 mEq/L (ref 135–145)

## 2013-06-30 LAB — VITAMIN B12: Vitamin B-12: 383 pg/mL (ref 211–911)

## 2013-06-30 LAB — INSULIN, FASTING: Insulin fasting, serum: 22 u[IU]/mL (ref 3–28)

## 2013-06-30 LAB — VITAMIN D 25 HYDROXY (VIT D DEFICIENCY, FRACTURES): Vit D, 25-Hydroxy: 82 ng/mL (ref 30–89)

## 2013-06-30 LAB — FERRITIN: Ferritin: 93 ng/mL (ref 10–291)

## 2013-06-30 LAB — TSH: TSH: 2.431 u[IU]/mL (ref 0.350–4.500)

## 2013-06-30 LAB — MAGNESIUM: Magnesium: 1.8 mg/dL (ref 1.5–2.5)

## 2013-08-01 ENCOUNTER — Emergency Department (HOSPITAL_COMMUNITY)
Admission: EM | Admit: 2013-08-01 | Discharge: 2013-08-01 | Disposition: A | Discharge status: Home / Self Care | Payer: 59 | Attending: Emergency Medicine | Admitting: Emergency Medicine

## 2013-08-01 ENCOUNTER — Encounter (HOSPITAL_COMMUNITY): Payer: Self-pay | Admitting: Emergency Medicine

## 2013-08-01 DIAGNOSIS — R209 Unspecified disturbances of skin sensation: Secondary | ICD-10-CM | POA: Insufficient documentation

## 2013-08-01 DIAGNOSIS — M549 Dorsalgia, unspecified: Secondary | ICD-10-CM

## 2013-08-01 DIAGNOSIS — M25519 Pain in unspecified shoulder: Secondary | ICD-10-CM | POA: Insufficient documentation

## 2013-08-01 DIAGNOSIS — J449 Chronic obstructive pulmonary disease, unspecified: Secondary | ICD-10-CM | POA: Insufficient documentation

## 2013-08-01 DIAGNOSIS — Z87891 Personal history of nicotine dependence: Secondary | ICD-10-CM | POA: Insufficient documentation

## 2013-08-01 DIAGNOSIS — Z79899 Other long term (current) drug therapy: Secondary | ICD-10-CM | POA: Insufficient documentation

## 2013-08-01 DIAGNOSIS — I10 Essential (primary) hypertension: Secondary | ICD-10-CM | POA: Insufficient documentation

## 2013-08-01 DIAGNOSIS — Z8669 Personal history of other diseases of the nervous system and sense organs: Secondary | ICD-10-CM | POA: Insufficient documentation

## 2013-08-01 DIAGNOSIS — Z8719 Personal history of other diseases of the digestive system: Secondary | ICD-10-CM | POA: Insufficient documentation

## 2013-08-01 DIAGNOSIS — M545 Low back pain, unspecified: Secondary | ICD-10-CM | POA: Insufficient documentation

## 2013-08-01 DIAGNOSIS — G8921 Chronic pain due to trauma: Secondary | ICD-10-CM | POA: Insufficient documentation

## 2013-08-01 DIAGNOSIS — M542 Cervicalgia: Secondary | ICD-10-CM

## 2013-08-01 DIAGNOSIS — J4489 Other specified chronic obstructive pulmonary disease: Secondary | ICD-10-CM | POA: Insufficient documentation

## 2013-08-01 DIAGNOSIS — Z85820 Personal history of malignant melanoma of skin: Secondary | ICD-10-CM | POA: Insufficient documentation

## 2013-08-01 DIAGNOSIS — E079 Disorder of thyroid, unspecified: Secondary | ICD-10-CM | POA: Insufficient documentation

## 2013-08-01 DIAGNOSIS — F3189 Other bipolar disorder: Secondary | ICD-10-CM | POA: Insufficient documentation

## 2013-08-01 MED ORDER — KETOROLAC TROMETHAMINE 30 MG/ML IJ SOLN
30.0000 mg | Freq: Once | INTRAMUSCULAR | Status: AC
  Administered 2013-08-01: 30 mg via INTRAMUSCULAR
  Filled 2013-08-01: qty 1

## 2013-08-01 MED ORDER — DEXAMETHASONE SODIUM PHOSPHATE 10 MG/ML IJ SOLN
10.0000 mg | Freq: Once | INTRAMUSCULAR | Status: AC
  Administered 2013-08-01: 10 mg via INTRAMUSCULAR
  Filled 2013-08-01: qty 1

## 2013-08-01 MED ORDER — MELOXICAM 7.5 MG PO TABS: 15.0000 mg | ORAL_TABLET | Freq: Every day | ORAL | Status: DC

## 2013-08-01 MED ORDER — CYCLOBENZAPRINE HCL 5 MG PO TABS: 5.0000 mg | ORAL_TABLET | Freq: Three times a day (TID) | ORAL | Status: DC | PRN

## 2013-08-01 NOTE — ED Provider Notes (Signed)
CSN: [id number]     Arrival date & time 08/01/13  1152 History  This chart was scribed for non-physician practitioner, Vernie Murders, PA-C working with Veryl Speak, MD by Frederich Balding, ED scribe. This patient was seen in room TR06C/TR06C and the patient's care was started at 2:22 PM.   Chief Complaint  Patient presents with  . Neck Pain  . Back Pain   The history is provided by the patient. No language interpreter was used.   HPI Comments: [name] is a 53 y.o. female who presents to the Emergency Department complaining of continued stabbing neck pain that radiates into her back that started 3 months ago after a fall. Pain is similar to her chronic neck and back pain however it has worsened over the past 3-4 days. Movement worsens the pain. Pt states she also has left and right shoulder pain that radiates into her arms bilaterally from the fall. She states it causes some numbness in her left fourth and fifth fingers and right first through third fingers. She also has radiation of her lower back pain down the back of her right leg. Dr. Rip Harbour did cortisone injections in her shoulder one week ago and states she is currently waiting to schedule a cervical MRI. Patient also has another orthopedic provider who manages her lower back pain. Patient currently has a prescription for hydrocodone however it "makes her very anxious". Denies fever, chest pain, difficulty breathing, SOB, abdominal pain, bowel or bladder incontinence, difficulty urinating, dysuria.     Past Medical History  Diagnosis Date  . No pertinent past medical history   . Fibromyalgia   . PONV (postoperative nausea and vomiting)     used a scop patch last surg-did well  . Hypertension   . Hyperlipidemia   . COPD (chronic obstructive pulmonary disease)   . GERD (gastroesophageal reflux disease)   . Prediabetes   . Cancer     melanoma  . Bipolar 2 disorder, major depressive episode   . Thyroid disease   . Restless  leg syndrome    Past Surgical History  Procedure Laterality Date  . Ankle fusion  2011    right  . Cholecystectomy    . Eye surgery      rt cataract  . Dilation and curettage of uterus    . Tubal ligation    . Wrist fracture surgery  2004    left  . Endometrial ablation  2008  . Tonsillectomy    . Shoulder arthroscopy  8/13    left-DSC  . Knee arthroscopy with medial menisectomy Right 05/22/2012    Procedure: KNEE ARTHROSCOPY WITH MEDIAL MENISECTOMY and Medial Plica excision;  Surgeon: Alta Corning, MD;  Location: [facility];  Service: Orthopedics;  Laterality: Right;   Family History  Problem Relation Age of Onset  . Cancer Mother     Lung and stomach   . Heart disease Father   . Hypertension Father   . Stroke Father   . Heart failure Paternal Grandfather   . Cancer Paternal Grandfather     colon cancer   History  Substance Use Topics  . Smoking status: Former Smoker -- 1.00 packs/day for 30 years    Quit date: 11/25/2010  . Smokeless tobacco: Never Used  . Alcohol Use: No   OB History   Grav Para Term Preterm Abortions TAB SAB Ect Mult Living  Review of Systems  Constitutional: Negative for fever, chills, diaphoresis, activity change, appetite change and fatigue.  Respiratory: Negative for cough and shortness of breath.   Cardiovascular: Negative for chest pain.  Gastrointestinal: Negative for nausea, vomiting and abdominal pain.  Genitourinary: Negative for dysuria and difficulty urinating.       Negative for bowel or bladder incontinence.   Musculoskeletal: Positive for back pain, myalgias and neck pain. Negative for arthralgias, gait problem and joint swelling.  Skin: Negative for color change and wound.  Neurological: Positive for numbness. Negative for dizziness, weakness, light-headedness and headaches.  All other systems reviewed and are negative.  Allergies  Ppd and Sulfa antibiotics  Home Medications   Prior to  Admission medications   Medication Sig Start Date End Date Taking? Authorizing Provider  amphetamine-dextroamphetamine (ADDERALL) 20 MG tablet Take 0.5 tablets (10 mg total) by mouth 2 (two) times daily. 06/29/13   Vicie Mutters, PA-C  cholecalciferol (VITAMIN D) 1000 UNITS tablet Take by mouth daily. 7000 units    Historical Provider, MD  clonazePAM (KLONOPIN) 2 MG tablet 1 pill QHS for restless legs. 06/29/13   Vicie Mutters, PA-C  IRON PO Take by mouth daily.    Historical Provider, MD  levothyroxine (SYNTHROID, LEVOTHROID) 75 MCG tablet TAKE ONE TABLET BY MOUTH ONCE DAILY 03/23/13   Vicie Mutters, PA-C  MAGNESIUM PO Take by mouth daily.    Historical Provider, MD  pregabalin (LYRICA) 200 MG capsule Take one tablet 3 times daily as needed 03/23/13   Vicie Mutters, PA-C   BP 138/67  Pulse 92  Temp(Src) 97.6 F (36.4 C) (Oral)  Resp 16  Ht 5\' 3"  (1.6 m)  Wt 203 lb 8 oz (92.307 kg)  BMI 36.06 kg/m2  SpO2 98%  Filed Vitals:   08/01/13 1156 08/01/13 1448  BP: 138/67 124/63  Pulse: 92 67  Temp: 97.6 F (36.4 C)   TempSrc: Oral   Resp: 16   Height: 5\' 3"  (1.6 m)   Weight: 203 lb 8 oz (92.307 kg)   SpO2: 98% 98%    Physical Exam  Nursing note and vitals reviewed. Constitutional: She is oriented to person, place, and time. She appears well-developed and well-nourished. No distress.  HENT:  Head: Normocephalic and atraumatic.  Right Ear: External ear normal.  Left Ear: External ear normal.  Mouth/Throat: Oropharynx is clear and moist.  Eyes: Conjunctivae and EOM are normal. Right eye exhibits no discharge. Left eye exhibits no discharge.  Neck: Normal range of motion. Neck supple. No tracheal deviation present.  Diffuse tenderness to palpation to the posterior spinal or paraspinal muscles with no focal tenderness. Pain increased with range of motion of the neck.   Cardiovascular: Normal rate, regular rhythm, normal heart sounds and intact distal pulses.  Exam reveals no gallop  and no friction rub.   No murmur heard. Radial and dorsalis pedis pulses present and equal bilaterally  Pulmonary/Chest: Effort normal. No respiratory distress.  Abdominal: Soft. She exhibits no distension. There is no tenderness.  Musculoskeletal: Normal range of motion.       Back:  Tenderness to palpation to the posterior trapezius and shoulders bilaterally. Pain increased in the neck with shoulder range of motion. No tenderness to palpation to the arms bilaterally. Tenderness to palpation to the thoracic and lumbar spine and paraspinal muscles. Pain worse with sitting up and movement. Strength 5/5 in the upper and lower extremities bilaterally. Patient able to ambulate without difficulty or ataxia. Positive straight leg raise on the  right.   Neurological: She is alert and oriented to person, place, and time.  Sensation intact in the upper and lower extremities bilaterally. Patellar reflexes intact.  Skin: Skin is warm and dry. She is not diaphoretic.     No erythema, ecchymosis, edema, or lacerations throughout.  Psychiatric: She has a normal mood and affect. Her behavior is normal.    ED Course  Procedures (including critical care time)  DIAGNOSTIC STUDIES: Oxygen Saturation is 98% on RA, normal by my interpretation.    COORDINATION OF CARE: 2:31 PM-Discussed treatment plan which includes Toradol and decadron with pt at bedside and pt agreed to plan.   Labs Review Labs Reviewed - No data to display  Imaging Review No results found.   EKG Interpretation None      MDM   HARUNA ROHLFS is a 54 y.o. female  who presents to the Emergency Department complaining of continued stabbing neck pain that radiates into her back that started 3 months ago after a fall. Patient has a history of chronic lower back pain and neck pain, which is similar to her symptoms today. Neck pain possibly due to cervical radiculopathy. Patient has numbness and tingling with a nerve distribution  pattern on exam. Patient is currently being managed by an orthopedic specialist for this. Has had a MRI of the cervical and lumbar spine in the past which was reviewed showing DDD. Patient denies any new injuries or trauma. Patient was neurovascularly intact. No warning signs or symptoms of back pain including loss of bowel or bladder control, night sweats, waking from sleep with back pain, unexplained fevers or weight loss, history of cancer, or IV drug use. No concern for cauda equina, epidural abscess, or other serious/life threatening cause of back pain. Patient instructed to followup with her orthopedic physician. Return precautions, discharge instructions, and follow-up was discussed with the patient before discharge.     Discharge Medication List as of 08/01/2013  2:48 PM    START taking these medications   Details  cyclobenzaprine (FLEXERIL) 5 MG tablet Take 1 tablet (5 mg total) by mouth 3 (three) times daily as needed for muscle spasms., Starting 08/01/2013, Until Discontinued, Print    meloxicam (MOBIC) 7.5 MG tablet Take 2 tablets (15 mg total) by mouth daily., Starting 08/01/2013, Until Discontinued, Print       Final impressions: 1. Neck pain   2. Back pain       Denman George   I personally performed the services described in this documentation, which was scribed in my presence. The recorded information has been reviewed and is accurate.  Lucila [place], PA-C 08/02/13 1149

## 2013-08-01 NOTE — Discharge Instructions (Signed)
Take flexeril for muscle spasm - Please be careful with this medication.  It can cause drowsiness.  Use caution while driving, operating machinery, drinking alcohol, or any other activities that may impair your physical or mental abilities.   Take mobic for pain - take with food  Continue to take Vicodin as needed - take with food - Please be careful with this medication.  It can cause drowsiness.  Use caution while driving, operating machinery, drinking alcohol, or any other activities that may impair your physical or mental abilities.    Return to the emergency department if you develop any changing/worsening condition, loss of bowel/bladder function, weakness, loss of sensation, or any other concerns (please read additional information regarding your condition below)    Back Pain, Adult Low back pain is very common. About 1 in 5 people have back pain.The cause of low back pain is rarely dangerous. The pain often gets better over time.About half of people with a sudden onset of back pain feel better in just 2 weeks. About 8 in 10 people feel better by 6 weeks.  CAUSES Some common causes of back pain include:  Strain of the muscles or ligaments supporting the spine.  Wear and tear (degeneration) of the spinal discs.  Arthritis.  Direct injury to the back. DIAGNOSIS Most of the time, the direct cause of low back pain is not known.However, back pain can be treated effectively even when the exact cause of the pain is unknown.Answering your caregiver's questions about your overall health and symptoms is one of the most accurate ways to make sure the cause of your pain is not dangerous. If your caregiver needs more information, he or she may order lab work or imaging tests (X-rays or MRIs).However, even if imaging tests show changes in your back, this usually does not require surgery. HOME CARE INSTRUCTIONS For many people, back pain returns.Since low back pain is rarely dangerous, it is  often a condition that people can learn to [facility] their own.   Remain active. It is stressful on the back to sit or stand in one place. Do not sit, drive, or stand in one place for more than 30 minutes at a time. Take short walks on level surfaces as soon as pain allows.Try to increase the length of time you walk each day.  Do not stay in bed.Resting more than 1 or 2 days can delay your recovery.  Do not avoid exercise or work.Your body is made to move.It is not dangerous to be active, even though your back may hurt.Your back will likely heal faster if you return to being active before your pain is gone.  Pay attention to your body when you bend and lift. Many people have less discomfortwhen lifting if they bend their knees, keep the load close to their bodies,and avoid twisting. Often, the most comfortable positions are those that put less stress on your recovering back.  Find a comfortable position to sleep. Use a firm mattress and lie on your side with your knees slightly bent. If you lie on your back, put a pillow under your knees.  Only take over-the-counter or prescription medicines as directed by your caregiver. Over-the-counter medicines to reduce pain and inflammation are often the most helpful.Your caregiver may prescribe muscle relaxant drugs.These medicines help dull your pain so you can more quickly return to your normal activities and healthy exercise.  Put ice on the injured area.  Put ice in a plastic bag.  Place a towel  between your skin and the bag.  Leave the ice on for 15-20 minutes, 03-04 times a day for the first 2 to 3 days. After that, ice and heat may be alternated to reduce pain and spasms.  Ask your caregiver about trying back exercises and gentle massage. This may be of some benefit.  Avoid feeling anxious or stressed.Stress increases muscle tension and can worsen back pain.It is important to recognize when you are anxious or stressed and learn ways  to manage it.Exercise is a great option. SEEK MEDICAL CARE IF:  You have pain that is not relieved with rest or medicine.  You have pain that does not improve in 1 week.  You have new symptoms.  You are generally not feeling well. SEEK IMMEDIATE MEDICAL CARE IF:   You have pain that radiates from your back into your legs.  You develop new bowel or bladder control problems.  You have unusual weakness or numbness in your arms or legs.  You develop nausea or vomiting.  You develop abdominal pain.  You feel faint. Document Released: 03/25/2005 Document Revised: 09/24/2011 Document Reviewed: 08/13/2010 [facility] Patient Information 2014 [place], [place].  Neck pain A cervical sprain is an injury in the neck in which the strong, fibrous tissues (ligaments) that connect your neck bones stretch or tear. Cervical sprains can range from mild to severe. Severe cervical sprains can cause the neck vertebrae to be unstable. This can lead to damage of the spinal cord and can result in serious nervous system problems. The amount of time it takes for a cervical sprain to get better depends on the cause and extent of the injury. Most cervical sprains heal in 1 to 3 weeks. CAUSES  Severe cervical sprains may be caused by:   Contact sport injuries (such as from football, rugby, wrestling, hockey, auto racing, gymnastics, diving, martial arts, or boxing).   Motor vehicle collisions.   Whiplash injuries. This is an injury from a sudden forward-and backward whipping movement of the head and neck.  Falls.  Mild cervical sprains may be caused by:   Being in an awkward position, such as while cradling a telephone between your ear and shoulder.   Sitting in a chair that does not offer proper support.   Working at a poorly Landscape [employer] station.   Looking up or down for long periods of time.  SYMPTOMS   Pain, soreness, stiffness, or a burning sensation in the front, back, or sides of  the neck. This discomfort may develop immediately after the injury or slowly, 24 hours or more after the injury.   Pain or tenderness directly in the middle of the back of the neck.   Shoulder or upper back pain.   Limited ability to move the neck.   Headache.   Dizziness.   Weakness, numbness, or tingling in the hands or arms.   Muscle spasms.   Difficulty swallowing or chewing.   Tenderness and swelling of the neck.  DIAGNOSIS  Most of the time your health care provider can diagnose a cervical sprain by taking your history and doing a physical exam. Your health care provider will ask about previous neck injuries and any known neck problems, such as arthritis in the neck. X-rays may be taken to find out if there are any other problems, such as with the bones of the neck. Other tests, such as a CT scan or MRI, may also be needed.  TREATMENT  Treatment depends on the severity of the cervical  sprain. Mild sprains can be treated with rest, keeping the neck in place (immobilization), and pain medicines. Severe cervical sprains are immediately immobilized. Further treatment is done to help with pain, muscle spasms, and other symptoms and may include:  Medicines, such as pain relievers, numbing medicines, or muscle relaxants.   Physical therapy. This may involve stretching exercises, strengthening exercises, and posture training. Exercises and improved posture can help stabilize the neck, strengthen muscles, and help stop symptoms from returning.  HOME CARE INSTRUCTIONS   Put ice on the injured area.   Put ice in a plastic bag.   Place a towel between your skin and the bag.   Leave the ice on for 15 20 minutes, 3 4 times a day.   If your injury was severe, you may have been given a cervical collar to wear. A cervical collar is a two-piece collar designed to keep your neck from moving while it heals.  Do not remove the collar unless instructed by your health care  provider.  If you have long hair, keep it outside of the collar.  Ask your health care provider before making any adjustments to your collar. Minor adjustments may be required over time to improve comfort and reduce pressure on your chin or on the back of your head.  Ifyou are allowed to remove the collar for cleaning or bathing, follow your health care provider's instructions on how to do so safely.  Keep your collar clean by wiping it with mild soap and water and drying it completely. If the collar you have been given includes removable pads, remove them every 1 2 days and hand wash them with soap and water. Allow them to air dry. They should be completely dry before you wear them in the collar.  If you are allowed to remove the collar for cleaning and bathing, wash and dry the skin of your neck. Check your skin for irritation or sores. If you see any, tell your health care provider.  Do not drive while wearing the collar.   Only take over-the-counter or prescription medicines for pain, discomfort, or fever as directed by your health care provider.   Keep all follow-up appointments as directed by your health care provider.   Keep all physical therapy appointments as directed by your health care provider.   Make any needed adjustments to your workstation to promote good posture.   Avoid positions and activities that make your symptoms worse.   Warm up and stretch before being active to help prevent problems.  SEEK MEDICAL CARE IF:   Your pain is not controlled with medicine.   You are unable to decrease your pain medicine over time as planned.   Your activity level is not improving as expected.  SEEK IMMEDIATE MEDICAL CARE IF:   You develop any bleeding.  You develop stomach upset.  You have signs of an allergic reaction to your medicine.   Your symptoms get worse.   You develop new, unexplained symptoms.   You have numbness, tingling, weakness, or paralysis  in any part of your body.  MAKE SURE YOU:   Understand these instructions.  Will watch your condition.  Will get help right away if you are not doing well or get worse. Document Released: 01/20/2007 Document Revised: 01/13/2013 Document Reviewed: 09/30/2012 [facility] Patient Information 2014 [place].

## 2013-08-01 NOTE — ED Notes (Signed)
Pt presents to department for evaluation of neck and back pain. Ongoing for several months after falling in January. 9/10 pain at the time. Pt is alert and oriented x4. NAD.

## 2013-08-03 NOTE — ED Provider Notes (Signed)
Medical screening examination/treatment/procedure(s) were performed by non-physician practitioner and as supervising physician I was immediately available for consultation/collaboration.     Namira Rosekrans, MD 08/03/13 1408 

## 2013-08-04 ENCOUNTER — Encounter: Payer: Self-pay | Admitting: Emergency Medicine

## 2013-08-04 ENCOUNTER — Encounter: Payer: Self-pay | Admitting: Internal Medicine

## 2013-08-04 ENCOUNTER — Other Ambulatory Visit: Payer: Self-pay | Admitting: Emergency Medicine

## 2013-08-04 ENCOUNTER — Other Ambulatory Visit: Payer: Self-pay | Admitting: Family Medicine

## 2013-08-04 DIAGNOSIS — M25512 Pain in left shoulder: Secondary | ICD-10-CM

## 2013-08-04 DIAGNOSIS — M542 Cervicalgia: Secondary | ICD-10-CM

## 2013-08-04 MED ORDER — AMPHETAMINE-DEXTROAMPHETAMINE 20 MG PO TABS: 10.0000 mg | ORAL_TABLET | Freq: Two times a day (BID) | ORAL | Status: DC

## 2013-08-08 ENCOUNTER — Ambulatory Visit
Admission: RE | Admit: 2013-08-08 | Discharge: 2013-08-08 | Disposition: A | Discharge status: Home / Self Care | Payer: 59 | Source: Ambulatory Visit | Attending: Family Medicine | Admitting: Family Medicine

## 2013-08-08 DIAGNOSIS — M25512 Pain in left shoulder: Secondary | ICD-10-CM

## 2013-08-08 DIAGNOSIS — M542 Cervicalgia: Secondary | ICD-10-CM

## 2013-08-13 ENCOUNTER — Other Ambulatory Visit: Payer: Self-pay | Admitting: Orthopedic Surgery

## 2013-08-13 ENCOUNTER — Encounter (HOSPITAL_COMMUNITY): Payer: Self-pay

## 2013-08-17 ENCOUNTER — Encounter (HOSPITAL_COMMUNITY): Payer: Self-pay | Admitting: *Deleted

## 2013-08-26 ENCOUNTER — Other Ambulatory Visit: Payer: Self-pay | Admitting: Dermatology

## 2013-08-31 MED ORDER — POVIDONE-IODINE 7.5 % EX SOLN
Freq: Once | CUTANEOUS | Status: DC
  Filled 2013-08-31: qty 118

## 2013-08-31 MED ORDER — CEFAZOLIN SODIUM-DEXTROSE 2-3 GM-% IV SOLR
2.0000 g | INTRAVENOUS | Status: AC
  Administered 2013-09-01: 2 g via INTRAVENOUS
  Filled 2013-08-31: qty 50

## 2013-09-01 ENCOUNTER — Encounter (HOSPITAL_COMMUNITY): Payer: 59 | Admitting: Certified Registered Nurse Anesthetist

## 2013-09-01 ENCOUNTER — Ambulatory Visit (HOSPITAL_COMMUNITY): Payer: 59 | Admitting: Certified Registered Nurse Anesthetist

## 2013-09-01 ENCOUNTER — Encounter (HOSPITAL_COMMUNITY)
Admission: RE | Disposition: A | Discharge status: Home / Self Care | Payer: Self-pay | Source: Ambulatory Visit | Attending: Orthopedic Surgery

## 2013-09-01 ENCOUNTER — Ambulatory Visit (HOSPITAL_COMMUNITY): Payer: 59

## 2013-09-01 ENCOUNTER — Encounter (HOSPITAL_COMMUNITY): Payer: Self-pay | Admitting: *Deleted

## 2013-09-01 ENCOUNTER — Observation Stay (HOSPITAL_COMMUNITY)
Admission: RE | Admit: 2013-09-01 | Discharge: 2013-09-02 | Disposition: A | Discharge status: Home / Self Care | Payer: 59 | Source: Ambulatory Visit | Attending: Orthopedic Surgery | Admitting: Orthopedic Surgery

## 2013-09-01 DIAGNOSIS — K219 Gastro-esophageal reflux disease without esophagitis: Secondary | ICD-10-CM | POA: Insufficient documentation

## 2013-09-01 DIAGNOSIS — J449 Chronic obstructive pulmonary disease, unspecified: Secondary | ICD-10-CM | POA: Insufficient documentation

## 2013-09-01 DIAGNOSIS — Z87891 Personal history of nicotine dependence: Secondary | ICD-10-CM | POA: Insufficient documentation

## 2013-09-01 DIAGNOSIS — IMO0001 Reserved for inherently not codable concepts without codable children: Secondary | ICD-10-CM | POA: Insufficient documentation

## 2013-09-01 DIAGNOSIS — J4489 Other specified chronic obstructive pulmonary disease: Secondary | ICD-10-CM | POA: Insufficient documentation

## 2013-09-01 DIAGNOSIS — E785 Hyperlipidemia, unspecified: Secondary | ICD-10-CM | POA: Insufficient documentation

## 2013-09-01 DIAGNOSIS — M4802 Spinal stenosis, cervical region: Principal | ICD-10-CM | POA: Insufficient documentation

## 2013-09-01 DIAGNOSIS — E039 Hypothyroidism, unspecified: Secondary | ICD-10-CM | POA: Insufficient documentation

## 2013-09-01 DIAGNOSIS — F3189 Other bipolar disorder: Secondary | ICD-10-CM | POA: Insufficient documentation

## 2013-09-01 DIAGNOSIS — M5410 Radiculopathy, site unspecified: Secondary | ICD-10-CM

## 2013-09-01 DIAGNOSIS — F329 Major depressive disorder, single episode, unspecified: Secondary | ICD-10-CM | POA: Insufficient documentation

## 2013-09-01 HISTORY — PX: ANTERIOR CERVICAL DECOMP/DISCECTOMY FUSION: SHX1161

## 2013-09-01 HISTORY — DX: Unspecified hemorrhoids: K64.9

## 2013-09-01 HISTORY — DX: Hypothyroidism, unspecified: E03.9

## 2013-09-01 LAB — COMPREHENSIVE METABOLIC PANEL
ALT: 39 U/L — ABNORMAL HIGH (ref 0–35)
AST: 31 U/L (ref 0–37)
Albumin: 3.8 g/dL (ref 3.5–5.2)
Alkaline Phosphatase: 88 U/L (ref 39–117)
BUN: 13 mg/dL (ref 6–23)
CO2: 27 mEq/L (ref 19–32)
Calcium: 9.5 mg/dL (ref 8.4–10.5)
Chloride: 102 mEq/L (ref 96–112)
Creatinine, Ser: 0.67 mg/dL (ref 0.50–1.10)
GFR calc Af Amer: >90 mL/min (ref >90)
GFR calc non Af Amer: >90 mL/min (ref >90)
Glucose, Bld: 98 mg/dL (ref 70–99)
Potassium: 5 mEq/L (ref 3.7–5.3)
Sodium: 140 mEq/L (ref 137–147)
Total Bilirubin: 0.3 mg/dL (ref 0.3–1.2)
Total Protein: 6.9 g/dL (ref 6.0–8.3)

## 2013-09-01 LAB — SURGICAL PCR SCREEN
MRSA, PCR: NEGATIVE
Staphylococcus aureus: NEGATIVE

## 2013-09-01 LAB — CBC WITH DIFFERENTIAL/PLATELET
Basophils Absolute: 0 10*3/uL (ref 0.0–0.1)
Basophils Relative: 0 % (ref 0–1)
Eosinophils Absolute: 0.1 10*3/uL (ref 0.0–0.7)
Eosinophils Relative: 2 % (ref 0–5)
HCT: 39.1 % (ref 36.0–46.0)
Hemoglobin: 13.7 g/dL (ref 12.0–15.0)
Lymphocytes Relative: 31 % (ref 12–46)
Lymphs Abs: 2.8 10*3/uL (ref 0.7–4.0)
MCH: 31.2 pg (ref 26.0–34.0)
MCHC: 35 g/dL (ref 30.0–36.0)
MCV: 89.1 fL (ref 78.0–100.0)
Monocytes Absolute: 0.8 10*3/uL (ref 0.1–1.0)
Monocytes Relative: 9 % (ref 3–12)
Neutro Abs: 5.2 10*3/uL (ref 1.7–7.7)
Neutrophils Relative %: 58 % (ref 43–77)
Platelets: 292 10*3/uL (ref 150–400)
RBC: 4.39 MIL/uL (ref 3.87–5.11)
RDW: 13.1 % (ref 11.5–15.5)
WBC: 9.1 10*3/uL (ref 4.0–10.5)

## 2013-09-01 LAB — TYPE AND SCREEN
ABO/RH(D): A POS
Antibody Screen: NEGATIVE

## 2013-09-01 LAB — URINALYSIS, ROUTINE W REFLEX MICROSCOPIC
Bilirubin Urine: NEGATIVE
Glucose, UA: NEGATIVE mg/dL
Hgb urine dipstick: NEGATIVE
Ketones, ur: NEGATIVE mg/dL
Leukocytes, UA: NEGATIVE
Nitrite: NEGATIVE
Protein, ur: NEGATIVE mg/dL
Specific Gravity, Urine: 1.012 (ref 1.005–1.030)
Urobilinogen, UA: 0.2 mg/dL (ref 0.0–1.0)
pH: 7 (ref 5.0–8.0)

## 2013-09-01 LAB — PROTIME-INR
INR: 0.98 (ref 0.00–1.49)
Prothrombin Time: 12.8 seconds (ref 11.6–15.2)

## 2013-09-01 LAB — APTT: aPTT: 31 seconds (ref 24–37)

## 2013-09-01 LAB — ABO/RH: ABO/RH(D): A POS

## 2013-09-01 SURGERY — ANTERIOR CERVICAL DECOMPRESSION/DISCECTOMY FUSION 1 LEVEL
Anesthesia: General | Site: Neck

## 2013-09-01 MED ORDER — ALUM & MAG HYDROXIDE-SIMETH 200-200-20 MG/5ML PO SUSP: 30.0000 mL | Freq: Four times a day (QID) | ORAL | Status: DC | PRN

## 2013-09-01 MED ORDER — SCOPOLAMINE 1 MG/3DAYS TD PT72
MEDICATED_PATCH | TRANSDERMAL | Status: AC
Start: 2013-09-01 — End: 2013-09-02
  Filled 2013-09-01: qty 1

## 2013-09-01 MED ORDER — PREGABALIN 100 MG PO CAPS
200.0000 mg | ORAL_CAPSULE | ORAL | Status: DC
  Administered 2013-09-02: 200 mg via ORAL
  Filled 2013-09-01: qty 2

## 2013-09-01 MED ORDER — PROPOFOL 10 MG/ML IV BOLUS
INTRAVENOUS | Status: AC
  Filled 2013-09-01: qty 20

## 2013-09-01 MED ORDER — HYDROMORPHONE HCL PF 1 MG/ML IJ SOLN: 0.2500 mg | INTRAMUSCULAR | Status: DC | PRN

## 2013-09-01 MED ORDER — 0.9 % SODIUM CHLORIDE (POUR BTL) OPTIME
TOPICAL | Status: DC | PRN
  Administered 2013-09-01: 1000 mL

## 2013-09-01 MED ORDER — LEVOTHYROXINE SODIUM 75 MCG PO TABS
75.0000 ug | ORAL_TABLET | Freq: Every day | ORAL | Status: DC
  Administered 2013-09-02: 75 ug via ORAL
  Filled 2013-09-01 (×2): qty 1

## 2013-09-01 MED ORDER — EPHEDRINE SULFATE 50 MG/ML IJ SOLN
INTRAMUSCULAR | Status: DC | PRN
  Administered 2013-09-01 (×2): 5 mg via INTRAVENOUS

## 2013-09-01 MED ORDER — THROMBIN 20000 UNITS EX SOLR
CUTANEOUS | Status: AC
  Filled 2013-09-01: qty 20000

## 2013-09-01 MED ORDER — LACTATED RINGERS IV SOLN
INTRAVENOUS | Status: DC
  Administered 2013-09-01: 13:00:00 via INTRAVENOUS

## 2013-09-01 MED ORDER — FENTANYL CITRATE 0.05 MG/ML IJ SOLN
INTRAMUSCULAR | Status: AC
  Filled 2013-09-01: qty 5

## 2013-09-01 MED ORDER — STERILE WATER FOR INJECTION IJ SOLN
INTRAMUSCULAR | Status: AC
  Filled 2013-09-01: qty 10

## 2013-09-01 MED ORDER — ACETAMINOPHEN 650 MG RE SUPP: 650.0000 mg | RECTAL | Status: DC | PRN

## 2013-09-01 MED ORDER — BISACODYL 5 MG PO TBEC
5.0000 mg | DELAYED_RELEASE_TABLET | Freq: Every day | ORAL | Status: DC | PRN
  Filled 2013-09-01: qty 1

## 2013-09-01 MED ORDER — MUPIROCIN 2 % EX OINT
TOPICAL_OINTMENT | CUTANEOUS | Status: AC
  Administered 2013-09-01: 1 via NASAL
  Filled 2013-09-01: qty 22

## 2013-09-01 MED ORDER — FENTANYL CITRATE 0.05 MG/ML IJ SOLN
INTRAMUSCULAR | Status: DC | PRN
  Administered 2013-09-01: 100 ug via INTRAVENOUS
  Administered 2013-09-01 (×3): 50 ug via INTRAVENOUS

## 2013-09-01 MED ORDER — SENNOSIDES-DOCUSATE SODIUM 8.6-50 MG PO TABS
1.0000 | ORAL_TABLET | Freq: Every evening | ORAL | Status: DC | PRN
  Filled 2013-09-01: qty 1

## 2013-09-01 MED ORDER — FERROUS SULFATE 325 (65 FE) MG PO TABS
325.0000 mg | ORAL_TABLET | Freq: Every day | ORAL | Status: DC
  Administered 2013-09-01: 325 mg via ORAL
  Filled 2013-09-01 (×2): qty 1

## 2013-09-01 MED ORDER — GLYCOPYRROLATE 0.2 MG/ML IJ SOLN
INTRAMUSCULAR | Status: DC | PRN
  Administered 2013-09-01: .6 mg via INTRAVENOUS
  Administered 2013-09-01: 0.2 mg via INTRAVENOUS

## 2013-09-01 MED ORDER — PHENOL 1.4 % MT LIQD
1.0000 | OROMUCOSAL | Status: DC | PRN
  Filled 2013-09-01: qty 177

## 2013-09-01 MED ORDER — ROCURONIUM BROMIDE 50 MG/5ML IV SOLN
INTRAVENOUS | Status: AC
  Filled 2013-09-01: qty 1

## 2013-09-01 MED ORDER — PROMETHAZINE HCL 25 MG/ML IJ SOLN: 6.2500 mg | INTRAMUSCULAR | Status: DC | PRN

## 2013-09-01 MED ORDER — LIDOCAINE HCL (CARDIAC) 20 MG/ML IV SOLN
INTRAVENOUS | Status: AC
  Filled 2013-09-01: qty 5

## 2013-09-01 MED ORDER — ONDANSETRON HCL 4 MG/2ML IJ SOLN: 4.0000 mg | INTRAMUSCULAR | Status: DC | PRN

## 2013-09-01 MED ORDER — FENTANYL CITRATE 0.05 MG/ML IJ SOLN
INTRAMUSCULAR | Status: AC
  Administered 2013-09-01: 50 ug via INTRAVENOUS
  Filled 2013-09-01: qty 2

## 2013-09-01 MED ORDER — NEOSTIGMINE METHYLSULFATE 10 MG/10ML IV SOLN
INTRAVENOUS | Status: AC
  Filled 2013-09-01: qty 1

## 2013-09-01 MED ORDER — SODIUM CHLORIDE 0.9 % IJ SOLN
3.0000 mL | Freq: Two times a day (BID) | INTRAMUSCULAR | Status: DC
  Administered 2013-09-01: 3 mL via INTRAVENOUS

## 2013-09-01 MED ORDER — VITAMIN D3 25 MCG (1000 UNIT) PO TABS
7000.0000 [IU] | ORAL_TABLET | Freq: Every day | ORAL | Status: DC
  Administered 2013-09-01: 7000 [IU] via ORAL
  Filled 2013-09-01 (×2): qty 7

## 2013-09-01 MED ORDER — NEOSTIGMINE METHYLSULFATE 10 MG/10ML IV SOLN
INTRAVENOUS | Status: DC | PRN
  Administered 2013-09-01: 3 mg via INTRAVENOUS

## 2013-09-01 MED ORDER — MIDAZOLAM HCL 5 MG/5ML IJ SOLN
INTRAMUSCULAR | Status: DC | PRN
  Administered 2013-09-01: 2 mg via INTRAVENOUS

## 2013-09-01 MED ORDER — MAGNESIUM 250 MG PO TABS: 250.0000 mg | ORAL_TABLET | Freq: Every day | ORAL | Status: DC

## 2013-09-01 MED ORDER — ARTIFICIAL TEARS OP OINT
TOPICAL_OINTMENT | OPHTHALMIC | Status: DC | PRN
  Administered 2013-09-01: 1 via OPHTHALMIC

## 2013-09-01 MED ORDER — ONDANSETRON HCL 4 MG/2ML IJ SOLN
INTRAMUSCULAR | Status: AC
  Filled 2013-09-01: qty 2

## 2013-09-01 MED ORDER — GABAPENTIN 300 MG PO CAPS
300.0000 mg | ORAL_CAPSULE | Freq: Every day | ORAL | Status: DC
  Administered 2013-09-01: 300 mg via ORAL
  Filled 2013-09-01 (×2): qty 1

## 2013-09-01 MED ORDER — EPHEDRINE SULFATE 50 MG/ML IJ SOLN
INTRAMUSCULAR | Status: AC
  Filled 2013-09-01: qty 1

## 2013-09-01 MED ORDER — MENTHOL 3 MG MT LOZG
1.0000 | LOZENGE | OROMUCOSAL | Status: DC | PRN
  Filled 2013-09-01: qty 9

## 2013-09-01 MED ORDER — PROPOFOL 10 MG/ML IV BOLUS
INTRAVENOUS | Status: DC | PRN
  Administered 2013-09-01: 200 mg via INTRAVENOUS

## 2013-09-01 MED ORDER — MUPIROCIN 2 % EX OINT
TOPICAL_OINTMENT | Freq: Once | CUTANEOUS | Status: AC
  Administered 2013-09-01: 1 via NASAL
  Filled 2013-09-01: qty 22

## 2013-09-01 MED ORDER — DIAZEPAM 5 MG PO TABS
ORAL_TABLET | ORAL | Status: AC
  Administered 2013-09-01: 5 mg via ORAL
  Filled 2013-09-01: qty 1

## 2013-09-01 MED ORDER — CEFAZOLIN SODIUM 1-5 GM-% IV SOLN
1.0000 g | Freq: Three times a day (TID) | INTRAVENOUS | Status: AC
  Administered 2013-09-01 – 2013-09-02 (×2): 1 g via INTRAVENOUS
  Filled 2013-09-01: qty 50

## 2013-09-01 MED ORDER — ROCURONIUM BROMIDE 100 MG/10ML IV SOLN
INTRAVENOUS | Status: DC | PRN
  Administered 2013-09-01: 50 mg via INTRAVENOUS

## 2013-09-01 MED ORDER — GABAPENTIN 600 MG PO TABS
300.0000 mg | ORAL_TABLET | Freq: Every day | ORAL | Status: DC
  Filled 2013-09-01: qty 0.5

## 2013-09-01 MED ORDER — LACTATED RINGERS IV SOLN
INTRAVENOUS | Status: DC | PRN
  Administered 2013-09-01: 15:00:00 via INTRAVENOUS

## 2013-09-01 MED ORDER — FENTANYL CITRATE 0.05 MG/ML IJ SOLN
25.0000 ug | INTRAMUSCULAR | Status: DC | PRN
  Administered 2013-09-01 (×3): 50 ug via INTRAVENOUS

## 2013-09-01 MED ORDER — MAGNESIUM OXIDE 400 (241.3 MG) MG PO TABS
400.0000 mg | ORAL_TABLET | Freq: Every day | ORAL | Status: DC
  Administered 2013-09-01: 400 mg via ORAL
  Filled 2013-09-01 (×2): qty 1

## 2013-09-01 MED ORDER — AMPHETAMINE-DEXTROAMPHETAMINE 10 MG PO TABS: 20.0000 mg | ORAL_TABLET | Freq: Two times a day (BID) | ORAL | Status: DC

## 2013-09-01 MED ORDER — SCOPOLAMINE 1 MG/3DAYS TD PT72
1.0000 | MEDICATED_PATCH | TRANSDERMAL | Status: DC
  Administered 2013-09-01: 1.5 mg via TRANSDERMAL

## 2013-09-01 MED ORDER — DOCUSATE SODIUM 100 MG PO CAPS
100.0000 mg | ORAL_CAPSULE | Freq: Two times a day (BID) | ORAL | Status: DC
  Administered 2013-09-01: 100 mg via ORAL
  Filled 2013-09-01 (×3): qty 1

## 2013-09-01 MED ORDER — BUPIVACAINE-EPINEPHRINE (PF) 0.25% -1:200000 IJ SOLN
INTRAMUSCULAR | Status: AC
  Filled 2013-09-01: qty 30

## 2013-09-01 MED ORDER — DIAZEPAM 5 MG PO TABS
5.0000 mg | ORAL_TABLET | Freq: Four times a day (QID) | ORAL | Status: DC | PRN
  Administered 2013-09-01 – 2013-09-02 (×2): 5 mg via ORAL
  Filled 2013-09-01: qty 1

## 2013-09-01 MED ORDER — PREGABALIN 100 MG PO CAPS
200.0000 mg | ORAL_CAPSULE | Freq: Three times a day (TID) | ORAL | Status: DC
  Filled 2013-09-01: qty 2

## 2013-09-01 MED ORDER — SCOPOLAMINE 1 MG/3DAYS TD PT72
MEDICATED_PATCH | TRANSDERMAL | Status: AC
  Filled 2013-09-01: qty 1

## 2013-09-01 MED ORDER — THROMBIN 20000 UNITS EX SOLR
OROMUCOSAL | Status: DC | PRN
  Administered 2013-09-01: 16:00:00 via TOPICAL

## 2013-09-01 MED ORDER — MIDAZOLAM HCL 2 MG/2ML IJ SOLN
INTRAMUSCULAR | Status: AC
  Filled 2013-09-01: qty 2

## 2013-09-01 MED ORDER — MEPERIDINE HCL 25 MG/ML IJ SOLN: 6.2500 mg | INTRAMUSCULAR | Status: DC | PRN

## 2013-09-01 MED ORDER — SODIUM CHLORIDE 0.9 % IJ SOLN: 3.0000 mL | INTRAMUSCULAR | Status: DC | PRN

## 2013-09-01 MED ORDER — ONDANSETRON HCL 4 MG/2ML IJ SOLN
INTRAMUSCULAR | Status: DC | PRN
  Administered 2013-09-01: 4 mg via INTRAVENOUS

## 2013-09-01 MED ORDER — GLYCOPYRROLATE 0.2 MG/ML IJ SOLN
INTRAMUSCULAR | Status: AC
  Filled 2013-09-01: qty 3

## 2013-09-01 MED ORDER — FLEET ENEMA 7-19 GM/118ML RE ENEM: 1.0000 | ENEMA | Freq: Once | RECTAL | Status: AC | PRN

## 2013-09-01 MED ORDER — LIDOCAINE HCL (CARDIAC) 20 MG/ML IV SOLN
INTRAVENOUS | Status: DC | PRN
  Administered 2013-09-01: 100 mg via INTRAVENOUS

## 2013-09-01 MED ORDER — PNEUMOCOCCAL VAC POLYVALENT 25 MCG/0.5ML IJ INJ
0.5000 mL | INJECTION | INTRAMUSCULAR | Status: AC
  Administered 2013-09-02: 0.5 mL via INTRAMUSCULAR
  Filled 2013-09-01: qty 0.5

## 2013-09-01 MED ORDER — LACTATED RINGERS IV SOLN: INTRAVENOUS | Status: DC

## 2013-09-01 MED ORDER — KETOROLAC TROMETHAMINE 30 MG/ML IJ SOLN
30.0000 mg | Freq: Once | INTRAMUSCULAR | Status: AC
  Administered 2013-09-01: 30 mg via INTRAVENOUS

## 2013-09-01 MED ORDER — MORPHINE SULFATE 2 MG/ML IJ SOLN
1.0000 mg | INTRAMUSCULAR | Status: DC | PRN
Start: 2013-09-01 — End: 2013-09-02
  Administered 2013-09-01: 4 mg via INTRAVENOUS
  Administered 2013-09-02 (×2): 2 mg via INTRAVENOUS
  Filled 2013-09-01 (×2): qty 1
  Filled 2013-09-01: qty 2

## 2013-09-01 MED ORDER — KETOROLAC TROMETHAMINE 30 MG/ML IJ SOLN
INTRAMUSCULAR | Status: AC
Start: 2013-09-01 — End: 2013-09-02
  Filled 2013-09-01: qty 1

## 2013-09-01 MED ORDER — OXYCODONE-ACETAMINOPHEN 5-325 MG PO TABS
1.0000 | ORAL_TABLET | ORAL | Status: DC | PRN
  Administered 2013-09-02: 2 via ORAL
  Filled 2013-09-01: qty 2

## 2013-09-01 MED ORDER — MUPIROCIN 2 % EX OINT
TOPICAL_OINTMENT | Freq: Two times a day (BID) | CUTANEOUS | Status: DC
  Filled 2013-09-01: qty 22

## 2013-09-01 MED ORDER — BUPIVACAINE-EPINEPHRINE 0.25% -1:200000 IJ SOLN
INTRAMUSCULAR | Status: DC | PRN
  Administered 2013-09-01: 6 mL

## 2013-09-01 MED ORDER — SODIUM CHLORIDE 0.9 % IV SOLN: 250.0000 mL | INTRAVENOUS | Status: DC

## 2013-09-01 MED ORDER — CLONAZEPAM 0.5 MG PO TABS
1.0000 mg | ORAL_TABLET | Freq: Every day | ORAL | Status: DC
  Administered 2013-09-01: 1 mg via ORAL
  Filled 2013-09-01: qty 2

## 2013-09-01 MED ORDER — ACETAMINOPHEN 325 MG PO TABS: 650.0000 mg | ORAL_TABLET | ORAL | Status: DC | PRN

## 2013-09-01 MED ORDER — ARTIFICIAL TEARS OP OINT
TOPICAL_OINTMENT | OPHTHALMIC | Status: AC
  Filled 2013-09-01: qty 3.5

## 2013-09-01 SURGICAL SUPPLY — 74 items
APL SKNCLS STERI-STRIP NONHPOA (GAUZE/BANDAGES/DRESSINGS) ×1
BENZOIN TINCTURE PRP APPL 2/3 (GAUZE/BANDAGES/DRESSINGS) ×2 IMPLANT
BIT DRILL NEURO 2X3.1 SFT TUCH (MISCELLANEOUS) ×1 IMPLANT
BIT DRILL SKYLINE 12MM (BIT) IMPLANT
BLADE SURG 15 STRL LF DISP TIS (BLADE) ×1 IMPLANT
BLADE SURG 15 STRL SS (BLADE) ×2
BLADE SURG ROTATE 9660 (MISCELLANEOUS) ×2 IMPLANT
BUR MATCHSTICK NEURO 3.0 LAGG (BURR) IMPLANT
CARTRIDGE OIL MAESTRO DRILL (MISCELLANEOUS) ×1 IMPLANT
CLSR STERI-STRIP ANTIMIC 1/2X4 (GAUZE/BANDAGES/DRESSINGS) ×1 IMPLANT
CORDS BIPOLAR (ELECTRODE) ×2 IMPLANT
COVER SURGICAL LIGHT HANDLE (MISCELLANEOUS) ×2 IMPLANT
CRADLE DONUT ADULT HEAD (MISCELLANEOUS) ×2 IMPLANT
DIFFUSER DRILL AIR PNEUMATIC (MISCELLANEOUS) ×2 IMPLANT
DRAIN JACKSON RD 7FR 3/32 (WOUND CARE) IMPLANT
DRAPE C-ARM 42X72 X-RAY (DRAPES) ×2 IMPLANT
DRAPE POUCH INSTRU U-SHP 10X18 (DRAPES) ×2 IMPLANT
DRAPE SURG 17X23 STRL (DRAPES) ×6 IMPLANT
DRILL BIT SKYLINE 12MM (BIT) ×2
DRILL NEURO 2X3.1 SOFT TOUCH (MISCELLANEOUS) ×2
DURAPREP 26ML APPLICATOR (WOUND CARE) ×2 IMPLANT
ELECT COATED BLADE 2.86 ST (ELECTRODE) ×2 IMPLANT
ELECT REM PT RETURN 9FT ADLT (ELECTROSURGICAL) ×2
ELECTRODE REM PT RTRN 9FT ADLT (ELECTROSURGICAL) ×1 IMPLANT
EVACUATOR SILICONE 100CC (DRAIN) IMPLANT
GAUZE SPONGE 4X4 16PLY XRAY LF (GAUZE/BANDAGES/DRESSINGS) ×2 IMPLANT
GLOVE BIO SURGEON STRL SZ7 (GLOVE) ×2 IMPLANT
GLOVE BIO SURGEON STRL SZ8 (GLOVE) ×2 IMPLANT
GLOVE BIOGEL PI IND STRL 7.0 (GLOVE) ×2 IMPLANT
GLOVE BIOGEL PI IND STRL 8 (GLOVE) ×1 IMPLANT
GLOVE BIOGEL PI INDICATOR 7.0 (GLOVE) ×2
GLOVE BIOGEL PI INDICATOR 8 (GLOVE) ×1
GOWN STRL REUS W/ TWL LRG LVL3 (GOWN DISPOSABLE) ×1 IMPLANT
GOWN STRL REUS W/ TWL XL LVL3 (GOWN DISPOSABLE) ×1 IMPLANT
GOWN STRL REUS W/TWL LRG LVL3 (GOWN DISPOSABLE) ×2
GOWN STRL REUS W/TWL XL LVL3 (GOWN DISPOSABLE) ×2
IMPL S ENDOSKEL TC 7 ODEG (Orthopedic Implant) IMPLANT
IMPLANT S ENDOSKEL TC 7 ODEG (Orthopedic Implant) ×2 IMPLANT
IV CATH 14GX2 1/4 (CATHETERS) ×2 IMPLANT
KIT BASIN OR (CUSTOM PROCEDURE TRAY) ×2 IMPLANT
KIT ROOM TURNOVER OR (KITS) ×2 IMPLANT
MANIFOLD NEPTUNE II (INSTRUMENTS) ×2 IMPLANT
NDL SPNL 20GX3.5 QUINCKE YW (NEEDLE) ×1 IMPLANT
NEEDLE 27GAX1X1/2 (NEEDLE) ×2 IMPLANT
NEEDLE SPNL 20GX3.5 QUINCKE YW (NEEDLE) ×2 IMPLANT
NS IRRIG 1000ML POUR BTL (IV SOLUTION) ×2 IMPLANT
OIL CARTRIDGE MAESTRO DRILL (MISCELLANEOUS) ×2
PACK ORTHO CERVICAL (CUSTOM PROCEDURE TRAY) ×2 IMPLANT
PAD ARMBOARD 7.5X6 YLW CONV (MISCELLANEOUS) ×4 IMPLANT
PATTIES SURGICAL .5 X.5 (GAUZE/BANDAGES/DRESSINGS) IMPLANT
PATTIES SURGICAL .5 X1 (DISPOSABLE) IMPLANT
PIN DISTRACTION 14 (PIN) ×1 IMPLANT
PLATE SKYLINE 12MM (Plate) ×1 IMPLANT
PUTTY BONE DBX 2.5 MIS (Bone Implant) ×1 IMPLANT
SCREW VAR SELF TAP SKYLINE 14M (Screw) ×4 IMPLANT
SPONGE GAUZE 4X4 12PLY (GAUZE/BANDAGES/DRESSINGS) ×2 IMPLANT
SPONGE GAUZE 4X4 12PLY STER LF (GAUZE/BANDAGES/DRESSINGS) ×1 IMPLANT
SPONGE INTESTINAL PEANUT (DISPOSABLE) ×2 IMPLANT
SPONGE SURGIFOAM ABS GEL 100 (HEMOSTASIS) IMPLANT
STRIP CLOSURE SKIN 1/2X4 (GAUZE/BANDAGES/DRESSINGS) ×2 IMPLANT
SURGIFLO TRUKIT (HEMOSTASIS) IMPLANT
SUT MNCRL AB 4-0 PS2 18 (SUTURE) IMPLANT
SUT SILK 4 0 (SUTURE)
SUT SILK 4-0 18XBRD TIE 12 (SUTURE) IMPLANT
SUT VIC AB 2-0 CT2 18 VCP726D (SUTURE) ×2 IMPLANT
SYR BULB IRRIGATION 50ML (SYRINGE) ×2 IMPLANT
SYR CONTROL 10ML LL (SYRINGE) ×4 IMPLANT
TAPE CLOTH 4X10 WHT NS (GAUZE/BANDAGES/DRESSINGS) ×2 IMPLANT
TAPE CLOTH SURG 4X10 WHT LF (GAUZE/BANDAGES/DRESSINGS) ×1 IMPLANT
TAPE UMBILICAL COTTON 1/8X30 (MISCELLANEOUS) ×2 IMPLANT
TOWEL OR 17X24 6PK STRL BLUE (TOWEL DISPOSABLE) ×2 IMPLANT
TOWEL OR 17X26 10 PK STRL BLUE (TOWEL DISPOSABLE) ×2 IMPLANT
WATER STERILE IRR 1000ML POUR (IV SOLUTION) ×2 IMPLANT
YANKAUER SUCT BULB TIP NO VENT (SUCTIONS) ×2 IMPLANT

## 2013-09-01 NOTE — Anesthesia Postprocedure Evaluation (Signed)
Anesthesia Post Note  Patient: [name]  Procedure(s) Performed: Procedure(s) (LRB): Anterior cervical decompression fusion, cervical 5-6 with instrumentation and allograft (N/A)  Anesthesia type: General  Patient location: PACU  Post pain: Pain level controlled  Post assessment: Post-op Vital signs reviewed  Last Vitals: BP 114/73  Pulse 54  Temp(Src) 36.8 C (Oral)  Resp 16  Ht 5\' 3"  (1.6 m)  Wt 196 lb (88.905 kg)  BMI 34.73 kg/m2  SpO2 98%  Post vital signs: Reviewed  Level of consciousness: sedated  Complications: No apparent anesthesia complications

## 2013-09-01 NOTE — Anesthesia Preprocedure Evaluation (Addendum)
Anesthesia Evaluation  Patient identified by MRN, date of birth, ID band Patient awake    Reviewed: Allergy & Precautions, H&P , NPO status , Patient's Chart, lab work & pertinent test results  History of Anesthesia Complications (+) PONV  Airway Mallampati: II TM Distance: >3 FB Neck ROM: Full    Dental no notable dental hx. (+) Caps   Pulmonary COPDformer smoker,  breath sounds clear to auscultation  Pulmonary exam normal       Cardiovascular negative cardio ROS  Rhythm:Regular Rate:Normal     Neuro/Psych negative neurological ROS  negative psych ROS   GI/Hepatic negative GI ROS, Neg liver ROS,   Endo/Other  Hypothyroidism   Renal/GU negative Renal ROS  negative genitourinary   Musculoskeletal negative musculoskeletal ROS (+)   Abdominal   Peds negative pediatric ROS (+)  Hematology negative hematology ROS (+)   Anesthesia Other Findings   Reproductive/Obstetrics negative OB ROS                          Anesthesia Physical Anesthesia Plan  ASA: II  Anesthesia Plan: General   Post-op Pain Management:    Induction: Intravenous  Airway Management Planned: Oral ETT  Additional Equipment:   Intra-op Plan:   Post-operative Plan: Extubation in OR  Informed Consent: I have reviewed the patients History and Physical, chart, labs and discussed the procedure including the risks, benefits and alternatives for the proposed anesthesia with the patient or authorized representative who has indicated his/her understanding and acceptance.   Dental advisory given  Plan Discussed with: CRNA  Anesthesia Plan Comments:         Anesthesia Quick Evaluation

## 2013-09-01 NOTE — Plan of Care (Signed)
Problem: Consults Goal: Diagnosis - Spinal Surgery Cervical Spine Fusion     

## 2013-09-01 NOTE — Transfer of Care (Signed)
Immediate Anesthesia Transfer of Care Note  Patient: [name]  Procedure(s) Performed: Procedure(s) with comments: Anterior cervical decompression fusion, cervical 5-6 with instrumentation and allograft (N/A) - Anterior cervical decompression fusion, cervical 5-6 with instrumentation and allograft  Patient Location: PACU  Anesthesia Type:General  Level of Consciousness: awake, alert , oriented and patient cooperative  Airway & Oxygen Therapy: Patient Spontanous Breathing and Patient connected to nasal cannula oxygen  Post-op Assessment: Report given to PACU RN, Post -op Vital signs reviewed and stable and Patient moving all extremities X 4  Post vital signs: Reviewed and stable  Complications: No apparent anesthesia complications

## 2013-09-01 NOTE — H&P (Signed)
PREOPERATIVE H&P  Chief Complaint: bilateral arm pain, balance deterioration  HPI: [name] is a 53 y.o. female who presents with ongoing pain in the bilateral arms s/p a fall. Also notes weakness.  MRI reveals NF stenosis and SCC at C5/6.  Patient has failed multiple forms of conservative care and continues to have pain (see office notes for additional details regarding the patient's full course of treatment)  Past Medical History  Diagnosis Date  . No pertinent past medical history   . Fibromyalgia   . PONV (postoperative nausea and vomiting)     used a scop patch last surg-did well  . Hyperlipidemia   . COPD (chronic obstructive pulmonary disease)   . GERD (gastroesophageal reflux disease)   . Prediabetes   . Bipolar 2 disorder, major depressive episode     denies  . Thyroid disease   . Restless leg syndrome   . Hypertension     normal now, only took medications for 1 month- thinks it was from pain.  Marland Kitchen Hypothyroidism   . Hemorrhoid   . Cancer     squamopus   Past Surgical History  Procedure Laterality Date  . Ankle fusion  2011    right  . Cholecystectomy    . Dilation and curettage of uterus    . Tubal ligation    . Wrist fracture surgery  2004    left  . Endometrial ablation  2008  . Tonsillectomy    . Shoulder arthroscopy  8/13    left-DSC  . Knee arthroscopy with medial menisectomy Right 05/22/2012    Procedure: KNEE ARTHROSCOPY WITH MEDIAL MENISECTOMY and Medial Plica excision;  Surgeon: Alta Corning, MD;  Location: [place];  Service: Orthopedics;  Laterality: Right;  . Skin cancer excision      leg  . Eye surgery Bilateral     rt cataract   History   Social History  . Marital Status: Married    Spouse Name: N/A    Number of Children: N/A  . Years of Education: N/A   Social History Main Topics  . Smoking status: Former Smoker -- 1.00 packs/day for 30 years    Quit date: 11/24/2009  . Smokeless tobacco: Never  Used  . Alcohol Use: No  . Drug Use: No  . Sexual Activity: Not Currently   Other Topics Concern  . None   Social History Narrative  . None   Family History  Problem Relation Age of Onset  . Cancer Father     Lung and stomach   . Heart disease Father   . Hypertension Father   . Stroke Father   . Heart failure Paternal Grandfather   . Cancer Paternal Grandfather     colon cancer   Allergies  Allergen Reactions  . Sulfa Antibiotics Hives and Itching   Prior to Admission medications   Medication Sig Start Date End Date Taking? Authorizing Provider  amphetamine-dextroamphetamine (ADDERALL) 20 MG tablet Take 20 mg by mouth 2 (two) times daily. 0.5 tablet in AM, 0.5 tablet after lunchtime   Yes Historical Provider, MD  gabapentin (NEURONTIN) 600 MG tablet Take 300 mg by mouth at bedtime.   Yes Historical Provider, MD  Magnesium 250 MG TABS Take 250 mg by mouth at bedtime.   Yes Historical Provider, MD  cholecalciferol (VITAMIN D) 1000 UNITS tablet Take 7,000 Units by mouth at bedtime.     Historical Provider, MD  clonazePAM (KLONOPIN) 2 MG tablet  Take 1 mg by mouth at bedtime.     Historical Provider, MD  ferrous sulfate 325 (65 FE) MG tablet Take 325 mg by mouth at bedtime.    Historical Provider, MD  HYDROcodone-acetaminophen (NORCO/VICODIN) 5-325 MG per tablet Take 1 tablet by mouth daily as needed (pain).     Historical Provider, MD  levothyroxine (SYNTHROID, LEVOTHROID) 75 MCG tablet Take 75 mcg by mouth daily before breakfast.    Historical Provider, MD  pregabalin (LYRICA) 200 MG capsule Take 200 mg by mouth 3 (three) times daily.    Historical Provider, MD     All other systems have been reviewed and were otherwise negative with the exception of those mentioned in the HPI and as above.  Physical Exam: There were no vitals filed for this visit.  General: Alert, no acute distress Cardiovascular: No pedal edema Respiratory: No cyanosis, no use of accessory  musculature Skin: No lesions in the area of chief complaint Neurologic: Sensation intact distally Psychiatric: Patient is competent for consent with normal mood and affect Lymphatic: No axillary or cervical lymphadenopathy  MUSCULOSKELETAL: + spurling's sign on right  Assessment/Plan: Neck and bilateral arm pain Plan for Procedure(s): Anterior cervical decompression fusion, cervical 5-6 with instrumentation and allograft   Sinclair Ship, MD 09/01/2013 7:57 AM

## 2013-09-01 NOTE — Anesthesia Procedure Notes (Signed)
Procedure Name: Intubation Date/Time: 09/01/2013 3:23 PM Performed by: Ned Grace Pre-anesthesia Checklist: Patient identified, Patient being monitored, Emergency Drugs available, Timeout performed and Suction available Patient Re-evaluated:Patient Re-evaluated prior to inductionOxygen Delivery Method: Circle system utilized Preoxygenation: Pre-oxygenation with 100% oxygen Intubation Type: IV induction Ventilation: Mask ventilation without difficulty Laryngoscope Size: Mac and 3 Grade View: Grade I Tube type: Oral Tube size: 7.0 mm Number of attempts: 1 Airway Equipment and Method: Stylet Placement Confirmation: ETT inserted through vocal cords under direct vision,  breath sounds checked- equal and bilateral and positive ETCO2 Secured at: 20 cm Tube secured with: Tape Dental Injury: Teeth and Oropharynx as per pre-operative assessment

## 2013-09-02 ENCOUNTER — Encounter (HOSPITAL_COMMUNITY): Payer: Self-pay | Admitting: Orthopedic Surgery

## 2013-09-02 NOTE — Progress Notes (Signed)
Pt doing well. Pt given D/C instructions with Rx's. Pt verbalized understanding of D/C teaching and all questions were answered. Pt D/C'd home with sift collar and Philly collar per MD order. Pt is stable @ D/C and has no other needs. Pt D/C'd home with sister via wheelchair @ 1000 per MD order. Holli Humbles, RN

## 2013-09-02 NOTE — Op Note (Signed)
NAME:  [name], [name] NO.:  [id number]  MEDICAL RECORD NO.:  [id number]  LOCATION:  [id number]                        FACILITY:  [facility]  PHYSICIAN:  Phylliss Bob, MD      DATE OF BIRTH:  [birth year]  DATE OF PROCEDURE:  09/01/2013                              OPERATIVE REPORT   PREOPERATIVE DIAGNOSES: 1. Bilateral cervical radiculopathy. 2. Bilateral C5-6 neural foraminal stenosis.  POSTOPERATIVE DIAGNOSES: 1. Bilateral cervical radiculopathy. 2. Bilateral C5-6 neural foraminal stenosis.  PROCEDURES: 1. Anterior cervical decompression and fusion, C5-6. 2. Placement of anterior instrumentation, C5-6. 3. Use of morselized allograft (DBX Mix). 4. Insertion of interbody device x1 (tight interbody spacer, 7 mm,     small, parallel). 5. Intraoperative use of fluoroscopy.  SURGEON:  Phylliss Bob, MD  ASSISTANT:  Pricilla Holm, PA-C  ANESTHESIA:  General endotracheal anesthesia.  COMPLICATIONS:  None.  DISPOSITION:  Stable.  ESTIMATED BLOOD LOSS:  Minimal.  INDICATIONS FOR PROCEDURE:  Briefly, [name] is a very pleasant 53- year-old female, who has been having ongoing pain in her bilateral arms for approximately 5 months.  An MRI did reveal bilateral neural foraminal stenosis at the C5-6 level.  She did also have the flexion of the spinal cord at the C5-6 level.  Given her symptoms and the findings on her MRI, we did discuss proceeding with the surgery reflected above.  OPERATIVE DETAILS:  On Sep 01, 2013, the patient was brought to Surgery and general endotracheal anesthesia was administered.  The patient was placed supine on the hospital bed.  The neck was gently extended.  The arms were secured at the sides and the region of the ulnar nerves were meticulously padded.  The neck was then prepped and draped in the usual sterile fashion.  A time-out procedure was performed.  I then made a left-sided transverse incision, centered over the C5-6  interspace.  The platysma was sharply incised.  A Smith-Robinson approach was utilized and the anterior spine was noted.  Self-retaining Shadow-Line retractor was placed.  I then obtained a lateral fluoroscopic view to confirm the appropriate operative level.  I then placed Caspar pins into the C5 and C6 vertebral bodies and distraction was applied.  I then used a 15-blade knife to perform an annulotomy.  I then used a series of curettes to perform a thorough diskectomy.  The posterior longitudinal ligament was noted and entered using a nerve hook.  I then used #1 followed by #2 Kerrison to perform a thorough and complete central and bilateral neural foraminal decompression.  I then prepared the endplates using a curette. I then placed a series of trials.  The appropriate-sized interbody spacer was packed with the DBX Mix and tamped into position in the usual fashion.  I was pleased with the press-fit.  Distraction was then discontinued and the Caspar pins were removed, then bone wax was placed in their place.  I then placed a 12-mm anterior cervical plate over the anterior spine.  14-mm screws were placed, two in each vertebral body at C5 and C6 for a total of four screws.  The cam-locking mechanism was then utilized.  I was very pleased with the final AP and lateral  fluoroscopic images.  The wound was then copiously irrigated.  The platysma was then closed using 2-0 Vicryl.  The subcutaneous layer was then closed using 3-0 Monocryl.  Benzoin and Steri-Strips were applied followed by sterile dressing.  All instrument counts were correct at the termination of the procedure.  Of note, Pricilla Holm was my assistant throughout surgery and did aid in retraction, suctioning and closure.     Phylliss Bob, MD     MD/MEDQ  D:  09/01/2013  T:  09/02/2013  Job:  [id number]  cc:   Unk Pinto, M.D.

## 2013-09-02 NOTE — Progress Notes (Signed)
Orthopedic Tech Progress Note Patient Details:  [name] 1960-06-08 [id number] Delivered Ortho Devices Type of Ortho Device: [place] cervical collar Ortho Device/Splint Interventions: Ordered   [place] 09/02/2013, 12:02 PM

## 2013-09-02 NOTE — Progress Notes (Signed)
    Patient doing well Reports that bilateral arm pain has resolved   Physical Exam: Filed Vitals:   09/02/13 0338  BP: 96/63  Pulse: 67  Temp: 98.3 F (36.8 C)  Resp: 18    Dressing in place NVI  POD #1 s/p C5/6 ACDF  - encourage ambulation - Percocet for pain, Valium for muscle spasms - d/c home today

## 2013-09-15 NOTE — Discharge Summary (Signed)
Patient ID: AUNESTY TYSON MRN: [id number] DOB/AGE: 08/01/1960 53 y.o.  Admit date: 09/01/2013 Discharge date: 09/02/2013  Admission Diagnoses:  Active Problems:   Radiculopathy   Discharge Diagnoses:  Same  Past Medical History  Diagnosis Date  . No pertinent past medical history   . Fibromyalgia   . PONV (postoperative nausea and vomiting)     used a scop patch last surg-did well  . Hyperlipidemia   . COPD (chronic obstructive pulmonary disease)   . GERD (gastroesophageal reflux disease)   . Prediabetes   . Bipolar 2 disorder, major depressive episode     denies  . Thyroid disease   . Restless leg syndrome   . Hypertension     normal now, only took medications for 1 month- thinks it was from pain.  Marland Kitchen Hypothyroidism   . Hemorrhoid   . Cancer     squamopus    Surgeries: Procedure(s): Anterior cervical decompression fusion, cervical 5-6 with instrumentation and allograft on 09/01/2013   Consultants:  None  Discharged Condition: Improved  Hospital Course: EMILIJA BOHMAN is an 53 y.o. female who was admitted 09/01/2013 for operative treatment of Radiculopathy. Patient has severe unremitting pain that affects sleep, daily activities, and work/hobbies. After pre-op clearance the patient was taken to the operating room on 09/01/2013 and underwent  Procedure(s): Anterior cervical decompression fusion, cervical 5-6 with instrumentation and allograft.    Patient was given perioperative antibiotics:  Anti-infectives   Start     Dose/Rate Route Frequency Ordered Stop   09/01/13 2000  ceFAZolin (ANCEF) IVPB 1 g/50 mL premix     1 g 100 mL/hr over 30 Minutes Intravenous Every 8 hours 09/01/13 1856 09/02/13 0430   09/01/13 0600  ceFAZolin (ANCEF) IVPB 2 g/50 mL premix     2 g 100 mL/hr over 30 Minutes Intravenous On call to O.R. 08/31/13 1412 09/01/13 1530       Patient was given sequential compression devices, early ambulation, and chemoprophylaxis to prevent  DVT.  Patient benefited maximally from hospital stay and there were no complications.    Recent vital signs: BP 98/58  Pulse 73  Temp(Src) 98.3 F (36.8 C) (Oral)  Resp 18  Ht 5\' 3"  (1.6 m)  Wt 88.905 kg (196 lb)  BMI 34.73 kg/m2  SpO2 90%   Discharge Medications:     Medication List    STOP taking these medications       HYDROcodone-acetaminophen 5-325 MG per tablet  Commonly known as:  NORCO/VICODIN      TAKE these medications       amphetamine-dextroamphetamine 20 MG tablet  Commonly known as:  ADDERALL  Take 10 mg by mouth 2 (two) times daily. 0.5 tablet in AM, 0.5 tablet after lunchtime     cholecalciferol 1000 UNITS tablet  Commonly known as:  VITAMIN D  Take 7,000 Units by mouth at bedtime.     clonazePAM 2 MG tablet  Commonly known as:  KLONOPIN  Take 1 mg by mouth at bedtime.     ferrous sulfate 325 (65 FE) MG tablet  Take 325 mg by mouth at bedtime.     gabapentin 600 MG tablet  Commonly known as:  NEURONTIN  Take 300 mg by mouth at bedtime.     levothyroxine 75 MCG tablet  Commonly known as:  SYNTHROID, LEVOTHROID  Take 75 mcg by mouth daily before breakfast.     Magnesium 250 MG Tabs  Take 250 mg by mouth at bedtime.  pregabalin 200 MG capsule  Commonly known as:  LYRICA  Take 200 mg by mouth 3 (three) times daily.        Diagnostic Studies: Dg Chest 2 View  09/01/2013   CLINICAL DATA:  Pre-op examination for anterior cervical fusion. History of COPD and hypertension.  EXAM: CHEST  2 VIEW  COMPARISON:  Radiographs 06/19/2012.  FINDINGS: The heart size and mediastinal contours are stable. The lungs are clear. Azygos fissure is again noted. There is no pleural effusion or pneumothorax. The osseous structures appear stable status post distal left clavicle resection.  IMPRESSION: Stable chest.  No acute cardiopulmonary process.   Electronically Signed   By: Camie Patience M.D.   On: 09/01/2013 12:40   Dg Cervical Spine 1 View  09/01/2013    CLINICAL DATA:  Cervical spine surgery .  EXAM: DG CERVICAL SPINE - 1 VIEW  COMPARISON:  MRI 08/08/2013.  FINDINGS: Patient has had prior C5-C6 anterior fusion. Good anatomic alignment noted on portable lateral view. NG tube and endotracheal tube noted.  IMPRESSION: Patient status post C5-C6 anterior fusion. Good anatomic alignment on portable lateral view.   Electronically Signed   By: Marcello Moores  Register   On: 09/01/2013 17:26   Dg C-arm 1-60 Min  09/10/2013   CLINICAL DATA:  Cervical spine surgery .  EXAM: DG CERVICAL SPINE - 1 VIEW  COMPARISON:  MRI 08/08/2013.  FINDINGS: Patient has had prior C5-C6 anterior fusion. Good anatomic alignment noted on portable lateral view. NG tube and endotracheal tube noted.  IMPRESSION: Patient status post C5-C6 anterior fusion. Good anatomic alignment on portable lateral view.   Electronically Signed   By: Marcello Moores  Register   On: 09/10/2013 14:00    Disposition: 01-Home or Self Care      Discharge Instructions   Discharge patient    Complete by:  As directed             POD #1 s/p C5/6 ACDF  - encourage ambulation  - Percocet for pain, Valium for muscle spasms  - d/c home today  Signed: Justice Britain 09/15/2013, 12:27 PM

## 2013-09-30 ENCOUNTER — Ambulatory Visit (INDEPENDENT_AMBULATORY_CARE_PROVIDER_SITE_OTHER): Payer: 59 | Admitting: Physician Assistant

## 2013-09-30 ENCOUNTER — Encounter: Payer: Self-pay | Admitting: Physician Assistant

## 2013-09-30 VITALS — BP 122/72 | HR 60 | Temp 97.7°F | Resp 16 | Ht 65.0 in | Wt 208.0 lb

## 2013-09-30 DIAGNOSIS — E785 Hyperlipidemia, unspecified: Secondary | ICD-10-CM

## 2013-09-30 DIAGNOSIS — I10 Essential (primary) hypertension: Secondary | ICD-10-CM

## 2013-09-30 DIAGNOSIS — E079 Disorder of thyroid, unspecified: Secondary | ICD-10-CM

## 2013-09-30 DIAGNOSIS — R7303 Prediabetes: Secondary | ICD-10-CM

## 2013-09-30 DIAGNOSIS — R7309 Other abnormal glucose: Secondary | ICD-10-CM

## 2013-09-30 DIAGNOSIS — E559 Vitamin D deficiency, unspecified: Secondary | ICD-10-CM

## 2013-09-30 LAB — CBC WITH DIFFERENTIAL/PLATELET
Basophils Absolute: 0 10*3/uL (ref 0.0–0.1)
Basophils Relative: 0 % (ref 0–1)
Eosinophils Absolute: 0.2 10*3/uL (ref 0.0–0.7)
Eosinophils Relative: 2 % (ref 0–5)
HCT: 41.2 % (ref 36.0–46.0)
Hemoglobin: 14 g/dL (ref 12.0–15.0)
Lymphocytes Relative: 36 % (ref 12–46)
Lymphs Abs: 2.8 10*3/uL (ref 0.7–4.0)
MCH: 29.6 pg (ref 26.0–34.0)
MCHC: 34 g/dL (ref 30.0–36.0)
MCV: 87.1 fL (ref 78.0–100.0)
Monocytes Absolute: 0.4 10*3/uL (ref 0.1–1.0)
Monocytes Relative: 5 % (ref 3–12)
Neutro Abs: 4.4 10*3/uL (ref 1.7–7.7)
Neutrophils Relative %: 57 % (ref 43–77)
Platelets: 339 10*3/uL (ref 150–400)
RBC: 4.73 MIL/uL (ref 3.87–5.11)
RDW: 13.1 % (ref 11.5–15.5)
WBC: 7.8 10*3/uL (ref 4.0–10.5)

## 2013-09-30 LAB — HEMOGLOBIN A1C
Hgb A1c MFr Bld: 5.7 % — ABNORMAL HIGH (ref <5.7)
Mean Plasma Glucose: 117 mg/dL — ABNORMAL HIGH (ref <117)

## 2013-09-30 MED ORDER — AMPHETAMINE-DEXTROAMPHETAMINE 20 MG PO TABS: ORAL_TABLET | ORAL | Status: DC

## 2013-09-30 MED ORDER — PREGABALIN 200 MG PO CAPS: 200.0000 mg | ORAL_CAPSULE | Freq: Three times a day (TID) | ORAL | Status: DC

## 2013-09-30 MED ORDER — CLONAZEPAM 2 MG PO TABS: ORAL_TABLET | ORAL | Status: DC

## 2013-09-30 MED ORDER — ESCITALOPRAM OXALATE 20 MG PO TABS: ORAL_TABLET | ORAL | Status: DC

## 2013-09-30 NOTE — Progress Notes (Signed)
Assessment and Plan:  Hypertension: Continue medication, monitor blood pressure at home.  Continue DASH diet. Cholesterol: Continue diet and exercise. Check cholesterol.  Pre-diabetes-Continue diet and exercise. Check A1C Vitamin D Def- check level and continue medications Hypothyroidism-check TSH level, needs to get back on medications, may be contributing to edema, depression. Anxiety/Depression- will add lexapro for hot flashes, depression, anxiety, follow up in 1 month.   Continue diet and meds as discussed. Further disposition pending results of labs.  HPI 53 y.o. female  presents for 3 month follow up with hypertension, hyperlipidemia, prediabetes and vitamin D. Her blood pressure has been controlled at home, today their BP is BP: 122/72 mmHg She does not workout. She denies chest pain, shortness of breath, dizziness.  She is on cholesterol medication and denies myalgias. Her cholesterol is at goal. The cholesterol last visit was:   Lab Results  Component Value Date   CHOL 176 06/29/2013   HDL 58 06/29/2013   LDLCALC 91 06/29/2013   TRIG 137 06/29/2013   CHOLHDL 3.0 06/29/2013   She has not been working on diet and exercise for prediabetes, and denies polydipsia, polyuria and visual disturbances. Last A1C in the office was:  Lab Results  Component Value Date   HGBA1C 6.1* 06/29/2013   Patient is on Vitamin D supplement.   She fell in Jan and has been having neck and back pain, she has been seeing Dr. Lynann Bologna who did a Discectomy on 09/01/2013, she is in a soft neck brace right now.  She also sees Dr. Berenice Primas and is going to be doing Left shoulder arthroscopy for possible RTC tear.  She is on thyroid medication. Her medication was not changed last visit but she states she has not been taking them since her surgery, she has forgotten to put them in her pill box. Patient denies diarrhea, nervousness and palpitations.  Lab Results  Component Value Date   TSH 2.431 06/29/2013  She has  also been off her klonopin during the day because she is afraid of taking the pain medication with the klonopin. She is also having hot flashes, having trouble at night, the klonopin is only working for 2-3 hours at night. She has been crying randomly.    Current Medications:  Current Outpatient Prescriptions on File Prior to Visit  Medication Sig Dispense Refill  . amphetamine-dextroamphetamine (ADDERALL) 20 MG tablet Take 10 mg by mouth 2 (two) times daily. 0.5 tablet in AM, 0.5 tablet after lunchtime      . cholecalciferol (VITAMIN D) 1000 UNITS tablet Take 7,000 Units by mouth at bedtime.       . clonazePAM (KLONOPIN) 2 MG tablet Take 1 mg by mouth at bedtime.       . ferrous sulfate 325 (65 FE) MG tablet Take 325 mg by mouth at bedtime.      . gabapentin (NEURONTIN) 600 MG tablet Take 300 mg by mouth at bedtime.      Marland Kitchen levothyroxine (SYNTHROID, LEVOTHROID) 75 MCG tablet Take 75 mcg by mouth daily before breakfast.      . Magnesium 250 MG TABS Take 250 mg by mouth at bedtime.      . pregabalin (LYRICA) 200 MG capsule Take 200 mg by mouth 3 (three) times daily.       No current facility-administered medications on file prior to visit.   Medical History:  Past Medical History  Diagnosis Date  . No pertinent past medical history   . Fibromyalgia   . PONV (postoperative  nausea and vomiting)     used a scop patch last surg-did well  . Hyperlipidemia   . COPD (chronic obstructive pulmonary disease)   . GERD (gastroesophageal reflux disease)   . Prediabetes   . Bipolar 2 disorder, major depressive episode     denies  . Thyroid disease   . Restless leg syndrome   . Hypertension     normal now, only took medications for 1 month- thinks it was from pain.  Marland Kitchen Hypothyroidism   . Hemorrhoid   . Cancer     squamopus   Allergies:  Allergies  Allergen Reactions  . Sulfa Antibiotics Hives and Itching     Review of Systems: [X]  = complains of  [ ]  = denies  General: Fatigue Valu.Nieves ] Fever  [ ]  Chills [ ]  Weakness [ ]   Insomnia [ ]  Eyes: Redness [ ]  Blurred vision [ ]  Diplopia [ ]   ENT: Congestion [ ]  Sinus Pain [ ]  Post Nasal Drip [ ]  Sore Throat [ ]  Earache [ ]   Cardiac: Chest pain/pressure [ ]  SOB [ ]  Orthopnea [ ]   Palpitations [ ]   Paroxysmal nocturnal dyspnea[ ]  Claudication [ ]  Edema [ ]   Pulmonary: Cough [ ]  Wheezing[ ]   SOB [ ]   Snoring [ ]   GI: Nausea [ ]  Vomiting[ ]  Dysphagia[ ]  Heartburn[ ]  Abdominal pain [ ]  Constipation [ ] ; Diarrhea [ ] ; BRBPR [ ]  Melena[ ]  GU: Hematuria[ ]  Dysuria [ ]  Nocturia[ ]  Urgency [ ]   Hesitancy [ ]  Discharge [ ]  Neuro: Headaches[ ]  Vertigo[ ]  Paresthesias[ ]  Spasm [ ]  Speech changes [ ]  Incoordination [ ]   Ortho: Arthritis [ ]  Joint pain [ ]  Muscle pain Valu.Nieves ] Joint swelling [ ]  Back Pain Valu.Nieves ] Skin:  Rash [ ]   Pruritis [ ]  Change in skin lesion [ ]   Psych: Depression[X ] Anxiety[ X] Confusion [ ]  Memory loss [ ]   Heme/Lypmh: Bleeding [ ]  Bruising [ ]  Enlarged lymph nodes [ ]   Endocrine: Visual blurring [ ]  Paresthesia [ ]  Polyuria [ ]  Polydypsea [ ]    Heat/cold intolerance [ ]  Hypoglycemia [ ]   Family history- Review and unchanged Social history- Review and unchanged Physical Exam: BP 122/72  Pulse 60  Temp(Src) 97.7 F (36.5 C)  Resp 16  Ht 5\' 5"  (1.651 m)  Wt 208 lb (94.348 kg)  BMI 34.61 kg/m2 Wt Readings from Last 3 Encounters:  09/30/13 208 lb (94.348 kg)  09/01/13 196 lb (88.905 kg)  09/01/13 196 lb (88.905 kg)   General Appearance: Well nourished, in no apparent distress. Eyes: PERRLA, EOMs, conjunctiva no swelling or erythema Sinuses: No Frontal/maxillary tenderness ENT/Mouth: Ext aud canals clear, TMs without erythema, bulging. No erythema, swelling, or exudate on post pharynx.  Tonsils not swollen or erythematous. Hearing normal.  Neck: Supple, thyroid normal.  Respiratory: Respiratory effort normal, BS equal bilaterally without rales, rhonchi, wheezing or stridor.  Cardio: RRR with no MRGs. Brisk peripheral pulses  without edema.  Abdomen: Soft, + BS.  Non tender, no guarding, rebound, hernias, masses. Lymphatics: Non tender without lymphadenopathy.  Musculoskeletal: Full ROM, 5/5 strength, normal gait.  Skin: Warm, dry without rashes, lesions, ecchymosis.  Neuro: Cranial nerves intact. Normal muscle tone, no cerebellar symptoms. Sensation intact.  Psych: Awake and oriented X 3, normal affect, Insight and Judgment appropriate. Very restless, crying, and anxious.     Vicie Mutters 9:57 AM

## 2013-09-30 NOTE — Patient Instructions (Signed)
Bad carbs also include fruit juice, alcohol, and sweet tea. These are empty calories that do not signal to your brain that you are full.   Please remember the good carbs are still carbs which convert into sugar. So please measure them out no more than 1/2-1 cup of rice, oatmeal, pasta, and beans.  Veggies are however free foods! Pile them on.   I like lean protein at every meal such as chicken, [place], pork chops, cottage cheese, etc. Just do not fry these meats and please center your meal around vegetable, the meats should be a side dish.   No all fruit is created equal. Please see the list below, the fruit at the bottom is higher in sugars than the fruit at the top   Hypothyroidism The thyroid is a large gland located in the lower front of your neck. The thyroid gland helps control metabolism. Metabolism is how your body handles food. It controls metabolism with the hormone thyroxine. When this gland is underactive (hypothyroid), it produces too little hormone.  CAUSES These include:   Absence or destruction of thyroid tissue.  Goiter due to iodine deficiency.  Goiter due to medications.  Congenital defects (since birth).  Problems with the pituitary. This causes a lack of TSH (thyroid stimulating hormone). This hormone tells the thyroid to turn out more hormone. SYMPTOMS  Lethargy (feeling as though you have no energy)  Cold intolerance  Weight gain (in spite of normal food intake)  Dry skin  Coarse hair  Menstrual irregularity (if severe, may lead to infertility)  Slowing of thought processes Cardiac problems are also caused by insufficient amounts of thyroid hormone. Hypothyroidism in the newborn is cretinism, and is an extreme form. It is important that this form be treated adequately and immediately or it will lead rapidly to retarded physical and mental development. DIAGNOSIS  To prove hypothyroidism, your caregiver may do blood tests and ultrasound tests.  Sometimes the signs are hidden. It may be necessary for your caregiver to watch this illness with blood tests either before or after diagnosis and treatment. TREATMENT  Low levels of thyroid hormone are increased by using synthetic thyroid hormone. This is a safe, effective treatment. It usually takes about four weeks to gain the full effects of the medication. After you have the full effect of the medication, it will generally take another four weeks for problems to leave. Your caregiver may start you on low doses. If you have had heart problems the dose may be gradually increased. It is generally not an emergency to get rapidly to normal. HOME CARE INSTRUCTIONS   Take your medications as your caregiver suggests. Let your caregiver know of any medications you are taking or start taking. Your caregiver will help you with dosage schedules.  As your condition improves, your dosage needs may increase. It will be necessary to have continuing blood tests as suggested by your caregiver.  Report all suspected medication side effects to your caregiver. SEEK MEDICAL CARE IF: Seek medical care if you develop:  Sweating.  Tremulousness (tremors).  Anxiety.  Rapid weight loss.  Heat intolerance.  Emotional swings.  Diarrhea.  Weakness. SEEK IMMEDIATE MEDICAL CARE IF:  You develop chest pain, an irregular heart beat (palpitations), or a rapid heart beat. MAKE SURE YOU:   Understand these instructions.  Will watch your condition.  Will get help right away if you are not doing well or get worse. Document Released: 03/25/2005 Document Revised: 06/17/2011 Document Reviewed: 11/13/2007 [employer] Patient  Information 2015 [employer]. This information is not intended to replace advice given to you by your health care provider. Make sure you discuss any questions you have with your health care provider.  

## 2013-10-01 LAB — BASIC METABOLIC PANEL WITH GFR
BUN: 16 mg/dL (ref 6–23)
CO2: 24 mEq/L (ref 19–32)
Calcium: 9.4 mg/dL (ref 8.4–10.5)
Chloride: 102 mEq/L (ref 96–112)
Creat: 0.61 mg/dL (ref 0.50–1.10)
GFR, Est African American: >89 mL/min
GFR, Est Non African American: >89 mL/min
Glucose, Bld: 97 mg/dL (ref 70–99)
Potassium: 4.6 mEq/L (ref 3.5–5.3)
Sodium: 137 mEq/L (ref 135–145)

## 2013-10-01 LAB — LIPID PANEL
Cholesterol: 194 mg/dL (ref 0–200)
HDL: 57 mg/dL (ref >39)
LDL Cholesterol: 117 mg/dL — ABNORMAL HIGH (ref 0–99)
Total CHOL/HDL Ratio: 3.4 Ratio
Triglycerides: 98 mg/dL (ref <150)
VLDL: 20 mg/dL (ref 0–40)

## 2013-10-01 LAB — HEPATIC FUNCTION PANEL
ALT: 51 U/L — ABNORMAL HIGH (ref 0–35)
AST: 38 U/L — ABNORMAL HIGH (ref 0–37)
Albumin: 4.4 g/dL (ref 3.5–5.2)
Alkaline Phosphatase: 86 U/L (ref 39–117)
Bilirubin, Direct: 0.1 mg/dL (ref 0.0–0.3)
Indirect Bilirubin: 0.3 mg/dL (ref 0.2–1.2)
Total Bilirubin: 0.4 mg/dL (ref 0.2–1.2)
Total Protein: 7 g/dL (ref 6.0–8.3)

## 2013-10-01 LAB — TSH: TSH: 6.5 u[IU]/mL — ABNORMAL HIGH (ref 0.350–4.500)

## 2013-10-01 LAB — VITAMIN D 25 HYDROXY (VIT D DEFICIENCY, FRACTURES): Vit D, 25-Hydroxy: 74 ng/mL (ref 30–89)

## 2013-10-01 LAB — INSULIN, FASTING: Insulin fasting, serum: 11 u[IU]/mL (ref 3–28)

## 2013-10-01 LAB — MAGNESIUM: Magnesium: 2 mg/dL (ref 1.5–2.5)

## 2013-10-01 NOTE — OR Nursing (Signed)
Addendum to scope page 

## 2013-10-04 ENCOUNTER — Other Ambulatory Visit: Payer: Self-pay

## 2013-10-04 MED ORDER — AMPHETAMINE-DEXTROAMPHETAMINE 20 MG PO TABS: 20.0000 mg | ORAL_TABLET | Freq: Two times a day (BID) | ORAL | Status: DC

## 2013-10-04 MED ORDER — CLONAZEPAM 2 MG PO TABS: 2.0000 mg | ORAL_TABLET | Freq: Two times a day (BID) | ORAL | Status: DC

## 2013-11-01 ENCOUNTER — Ambulatory Visit (INDEPENDENT_AMBULATORY_CARE_PROVIDER_SITE_OTHER): Payer: 59 | Admitting: Physician Assistant

## 2013-11-01 ENCOUNTER — Encounter: Payer: Self-pay | Admitting: Physician Assistant

## 2013-11-01 VITALS — BP 110/78 | HR 68 | Temp 97.5°F | Resp 16 | Wt 209.0 lb

## 2013-11-01 DIAGNOSIS — E039 Hypothyroidism, unspecified: Secondary | ICD-10-CM

## 2013-11-01 DIAGNOSIS — R748 Abnormal levels of other serum enzymes: Secondary | ICD-10-CM

## 2013-11-01 DIAGNOSIS — Z79899 Other long term (current) drug therapy: Secondary | ICD-10-CM

## 2013-11-01 LAB — BASIC METABOLIC PANEL WITH GFR
BUN: 14 mg/dL (ref 6–23)
CO2: 28 mEq/L (ref 19–32)
Calcium: 9.7 mg/dL (ref 8.4–10.5)
Chloride: 101 mEq/L (ref 96–112)
Creat: 0.64 mg/dL (ref 0.50–1.10)
GFR, Est African American: >89 mL/min
GFR, Est Non African American: >89 mL/min
Glucose, Bld: 95 mg/dL (ref 70–99)
Potassium: 4.4 mEq/L (ref 3.5–5.3)
Sodium: 136 mEq/L (ref 135–145)

## 2013-11-01 LAB — HEPATIC FUNCTION PANEL
ALT: 49 U/L — ABNORMAL HIGH (ref 0–35)
AST: 37 U/L (ref 0–37)
Albumin: 4.5 g/dL (ref 3.5–5.2)
Alkaline Phosphatase: 89 U/L (ref 39–117)
Bilirubin, Direct: 0.1 mg/dL (ref 0.0–0.3)
Indirect Bilirubin: 0.4 mg/dL (ref 0.2–1.2)
Total Bilirubin: 0.5 mg/dL (ref 0.2–1.2)
Total Protein: 6.5 g/dL (ref 6.0–8.3)

## 2013-11-01 LAB — TSH: TSH: 5.184 u[IU]/mL — ABNORMAL HIGH (ref 0.350–4.500)

## 2013-11-01 LAB — MAGNESIUM: Magnesium: 2.1 mg/dL (ref 1.5–2.5)

## 2013-11-01 MED ORDER — FUROSEMIDE 40 MG PO TABS: ORAL_TABLET | ORAL | Status: DC

## 2013-11-01 MED ORDER — AMPHETAMINE-DEXTROAMPHETAMINE 20 MG PO TABS: 20.0000 mg | ORAL_TABLET | Freq: Two times a day (BID) | ORAL | Status: DC

## 2013-11-01 NOTE — Progress Notes (Signed)
HPI 53 y.o.female presents for anxiety, hot flashes, and edema. She was not on her thyroid for several days, she is back on it but continues to have edema/swelling. She has been on pain medications due to her back/etc and this is likely contributing to the edema. She took a friends lasix and states that it helped. She was started on lexapro 20mg  (1/2 pill) in the morning but she states that she thinks it is making her tired. She is having arthroscopic left shoulder surgery August 5th with [facility] for RTC repair.   Lab Results  Component Value Date   TSH 6.500* 09/30/2013     Past Medical History  Diagnosis Date  . No pertinent past medical history   . Fibromyalgia   . PONV (postoperative nausea and vomiting)     used a scop patch last surg-did well  . Hyperlipidemia   . COPD (chronic obstructive pulmonary disease)   . GERD (gastroesophageal reflux disease)   . Prediabetes   . Bipolar 2 disorder, major depressive episode     denies  . Thyroid disease   . Restless leg syndrome   . Hypertension     normal now, only took medications for 1 month- thinks it was from pain.  Marland Kitchen Hypothyroidism   . Hemorrhoid   . Cancer     squamopus     Allergies  Allergen Reactions  . Sulfa Antibiotics Hives and Itching      Current Outpatient Prescriptions on File Prior to Visit  Medication Sig Dispense Refill  . amphetamine-dextroamphetamine (ADDERALL) 20 MG tablet Take 1 tablet (20 mg total) by mouth 2 (two) times daily.  60 tablet  0  . cholecalciferol (VITAMIN D) 1000 UNITS tablet Take 7,000 Units by mouth at bedtime.       . clonazePAM (KLONOPIN) 2 MG tablet Take 1 tablet (2 mg total) by mouth 2 (two) times daily.  60 tablet  1  . ferrous sulfate 325 (65 FE) MG tablet Take 325 mg by mouth at bedtime.      Marland Kitchen levothyroxine (SYNTHROID, LEVOTHROID) 75 MCG tablet Take 75 mcg by mouth daily before breakfast.      . Magnesium 250 MG TABS Take 250 mg by mouth at bedtime.      . pregabalin (LYRICA)  200 MG capsule Take 1 capsule (200 mg total) by mouth 3 (three) times daily.  90 capsule  3  . escitalopram (LEXAPRO) 20 MG tablet 1/2-1 pill in the morning for anxiety  30 tablet  2   No current facility-administered medications on file prior to visit.    ROS: all negative expect above.   Physical: Filed Weights   11/01/13 1044  Weight: 209 lb (94.802 kg)   BP 110/78  Pulse 68  Temp(Src) 97.5 F (36.4 C)  Resp 16  Wt 209 lb (94.802 kg) General Appearance: Well nourished, in no apparent distress. Eyes: PERRLA, EOMs. Sinuses: No Frontal/maxillary tenderness ENT/Mouth: Ext aud canals clear, normal light reflex with TMs without erythema, bulging. Post pharynx without erythema, swelling, exudate.  Respiratory: CTAB Cardio: RRR, no murmurs, rubs or gallops. Peripheral pulses brisk and equal bilaterally, with 2+ edema right foot and 1 + left foot. No aortic or femoral bruits. Abdomen: Soft, with bowl sounds. Nontender, no guarding, rebound. Lymphatics: Non tender without lymphadenopathy.  Musculoskeletal: Full ROM all peripheral extremities except decreased left shoulder due to pain, 5/5 strength, and antalgic gait Skin: Warm, dry without rashes, lesions, ecchymosis.  Neuro: Cranial nerves intact, reflexes equal bilaterally.  Normal muscle tone, no cerebellar symptoms. Sensation decreased right foot/leg. Pysch: Awake and oriented X 3, normal affect, Insight and Judgment appropriate.   Assessment and Plan: Anxiety- switch lexapro to night time Hypothyroidism-check TSH level, continue medications the same.  Edema- can do lasix 40mg  1/2 pill PRN for swelling ADD-  Continue ADD medication, helps with focus, no AE's. The patient was counseled on the addictive nature of the medication and was encouraged to take drug holidays when not needed.

## 2013-11-01 NOTE — Patient Instructions (Signed)
Take the lexapro at night, if you can try 1/4 pill if possible  Take the lasix as need for swelling 1/2-1 pill, if you get muscle cramps or dizziness let me know, try to take sparingly  Edema Edema is an abnormal buildup of fluids in your bodytissues. Edema is somewhatdependent on gravity to pull the fluid to the lowest place in your body. That makes the condition more common in the legs and thighs (lower extremities). Painless swelling of the feet and ankles is common and becomes more likely as you get older. It is also common in looser tissues, like around your eyes.  When the affected area is squeezed, the fluid may move out of that spot and leave a dent for a few moments. This dent is called pitting.  CAUSES  There are many possible causes of edema. Eating too much salt and being on your feet or sitting for a long time can cause edema in your legs and ankles. Hot weather may make edema worse. Common medical causes of edema include:  Heart failure.  Liver disease.  Kidney disease.  Weak blood vessels in your legs.  Cancer.  An injury.  Pregnancy.  Some medications.  Obesity. SYMPTOMS  Edema is usually painless.Your skin may look swollen or shiny.  DIAGNOSIS  Your health care provider may be able to diagnose edema by asking about your medical history and doing a physical exam. You may need to have tests such as X-rays, an electrocardiogram, or blood tests to check for medical conditions that may cause edema.  TREATMENT  Edema treatment depends on the cause. If you have heart, liver, or kidney disease, you need the treatment appropriate for these conditions. General treatment may include:  Elevation of the affected body part above the level of your heart.  Compression of the affected body part. Pressure from elastic bandages or support stockings squeezes the tissues and forces fluid back into the blood vessels. This keeps fluid from entering the tissues.  Restriction of  fluid and salt intake.  Use of a water pill (diuretic). These medications are appropriate only for some types of edema. They pull fluid out of your body and make you urinate more often. This gets rid of fluid and reduces swelling, but diuretics can have side effects. Only use diuretics as directed by your health care provider. HOME CARE INSTRUCTIONS   Keep the affected body part above the level of your heart when you are lying down.   Do not sit still or stand for prolonged periods.   Do not put anything directly under your knees when lying down.  Do not wear constricting clothing or garters on your upper legs.   Exercise your legs to work the fluid back into your blood vessels. This may help the swelling go down.   Wear elastic bandages or support stockings to reduce ankle swelling as directed by your health care provider.   Eat a low-salt diet to reduce fluid if your health care provider recommends it.   Only take medicines as directed by your health care provider. SEEK MEDICAL CARE IF:   Your edema is not responding to treatment.  You have heart, liver, or kidney disease and notice symptoms of edema.  You have edema in your legs that does not improve after elevating them.   You have sudden and unexplained weight gain. SEEK IMMEDIATE MEDICAL CARE IF:   You develop shortness of breath or chest pain.   You cannot breathe when you lie down.  You develop pain, redness, or warmth in the swollen areas.   You have heart, liver, or kidney disease and suddenly get edema.  You have a fever and your symptoms suddenly get worse. MAKE SURE YOU:   Understand these instructions.  Will watch your condition.  Will get help right away if you are not doing well or get worse. Document Released: 03/25/2005 Document Revised: 08/09/2013 Document Reviewed: 01/15/2013 [facility] Patient Information 2015 [place], [place]. This information is not intended to replace advice given to you  by your health care provider. Make sure you discuss any questions you have with your health care provider.

## 2013-11-08 ENCOUNTER — Encounter (HOSPITAL_BASED_OUTPATIENT_CLINIC_OR_DEPARTMENT_OTHER): Payer: Self-pay | Admitting: *Deleted

## 2013-11-08 ENCOUNTER — Other Ambulatory Visit: Payer: Self-pay | Admitting: Orthopedic Surgery

## 2013-11-08 NOTE — Progress Notes (Signed)
Pt has had shoulder and knee surgery here-cerv fusion 5/15-labs 11/01/13 and ekg 3/15-

## 2013-11-10 ENCOUNTER — Encounter (HOSPITAL_BASED_OUTPATIENT_CLINIC_OR_DEPARTMENT_OTHER): Payer: 59 | Admitting: Anesthesiology

## 2013-11-10 ENCOUNTER — Ambulatory Visit (HOSPITAL_BASED_OUTPATIENT_CLINIC_OR_DEPARTMENT_OTHER)
Admission: RE | Admit: 2013-11-10 | Discharge: 2013-11-10 | Disposition: A | Discharge status: Home / Self Care | Payer: 59 | Source: Ambulatory Visit | Attending: Orthopedic Surgery | Admitting: Orthopedic Surgery

## 2013-11-10 ENCOUNTER — Encounter (HOSPITAL_BASED_OUTPATIENT_CLINIC_OR_DEPARTMENT_OTHER): Payer: Self-pay | Admitting: *Deleted

## 2013-11-10 ENCOUNTER — Ambulatory Visit (HOSPITAL_BASED_OUTPATIENT_CLINIC_OR_DEPARTMENT_OTHER): Payer: 59 | Admitting: Anesthesiology

## 2013-11-10 ENCOUNTER — Encounter (HOSPITAL_BASED_OUTPATIENT_CLINIC_OR_DEPARTMENT_OTHER)
Admission: RE | Disposition: A | Discharge status: Home / Self Care | Payer: Self-pay | Source: Ambulatory Visit | Attending: Orthopedic Surgery

## 2013-11-10 DIAGNOSIS — I10 Essential (primary) hypertension: Secondary | ICD-10-CM | POA: Diagnosis not present

## 2013-11-10 DIAGNOSIS — S43439A Superior glenoid labrum lesion of unspecified shoulder, initial encounter: Secondary | ICD-10-CM | POA: Insufficient documentation

## 2013-11-10 DIAGNOSIS — S43429A Sprain of unspecified rotator cuff capsule, initial encounter: Secondary | ICD-10-CM | POA: Insufficient documentation

## 2013-11-10 DIAGNOSIS — F3189 Other bipolar disorder: Secondary | ICD-10-CM | POA: Insufficient documentation

## 2013-11-10 DIAGNOSIS — M25819 Other specified joint disorders, unspecified shoulder: Secondary | ICD-10-CM | POA: Insufficient documentation

## 2013-11-10 DIAGNOSIS — E079 Disorder of thyroid, unspecified: Secondary | ICD-10-CM | POA: Insufficient documentation

## 2013-11-10 DIAGNOSIS — Y990 Civilian activity done for income or pay: Secondary | ICD-10-CM | POA: Diagnosis not present

## 2013-11-10 DIAGNOSIS — E039 Hypothyroidism, unspecified: Secondary | ICD-10-CM | POA: Insufficient documentation

## 2013-11-10 DIAGNOSIS — Y9269 Other specified industrial and construction area as the place of occurrence of the external cause: Secondary | ICD-10-CM | POA: Diagnosis not present

## 2013-11-10 DIAGNOSIS — IMO0001 Reserved for inherently not codable concepts without codable children: Secondary | ICD-10-CM | POA: Diagnosis not present

## 2013-11-10 DIAGNOSIS — G2581 Restless legs syndrome: Secondary | ICD-10-CM | POA: Insufficient documentation

## 2013-11-10 DIAGNOSIS — J449 Chronic obstructive pulmonary disease, unspecified: Secondary | ICD-10-CM | POA: Diagnosis not present

## 2013-11-10 DIAGNOSIS — J4489 Other specified chronic obstructive pulmonary disease: Secondary | ICD-10-CM | POA: Insufficient documentation

## 2013-11-10 DIAGNOSIS — Y9389 Activity, other specified: Secondary | ICD-10-CM | POA: Insufficient documentation

## 2013-11-10 DIAGNOSIS — Z882 Allergy status to sulfonamides status: Secondary | ICD-10-CM | POA: Insufficient documentation

## 2013-11-10 DIAGNOSIS — M7580 Other shoulder lesions, unspecified shoulder: Secondary | ICD-10-CM

## 2013-11-10 DIAGNOSIS — R296 Repeated falls: Secondary | ICD-10-CM | POA: Insufficient documentation

## 2013-11-10 DIAGNOSIS — K219 Gastro-esophageal reflux disease without esophagitis: Secondary | ICD-10-CM | POA: Diagnosis not present

## 2013-11-10 DIAGNOSIS — E785 Hyperlipidemia, unspecified: Secondary | ICD-10-CM | POA: Diagnosis not present

## 2013-11-10 DIAGNOSIS — Z87891 Personal history of nicotine dependence: Secondary | ICD-10-CM | POA: Diagnosis not present

## 2013-11-10 HISTORY — PX: SHOULDER ARTHROSCOPY WITH SUBACROMIAL DECOMPRESSION: SHX5684

## 2013-11-10 LAB — POCT HEMOGLOBIN-HEMACUE: Hemoglobin: 13.7 g/dL (ref 12.0–15.0)

## 2013-11-10 SURGERY — SHOULDER ARTHROSCOPY WITH SUBACROMIAL DECOMPRESSION
Anesthesia: Regional | Site: Shoulder | Laterality: Left

## 2013-11-10 MED ORDER — LACTATED RINGERS IV SOLN
INTRAVENOUS | Status: DC
  Administered 2013-11-10 (×2): via INTRAVENOUS

## 2013-11-10 MED ORDER — ONDANSETRON HCL 4 MG/2ML IJ SOLN
INTRAMUSCULAR | Status: DC | PRN
  Administered 2013-11-10: 4 mg via INTRAVENOUS

## 2013-11-10 MED ORDER — CEFAZOLIN SODIUM-DEXTROSE 2-3 GM-% IV SOLR
2.0000 g | INTRAVENOUS | Status: AC
  Administered 2013-11-10: 2 g via INTRAVENOUS

## 2013-11-10 MED ORDER — MIDAZOLAM HCL 2 MG/2ML IJ SOLN
1.0000 mg | INTRAMUSCULAR | Status: DC | PRN
  Administered 2013-11-10: 2 mg via INTRAVENOUS

## 2013-11-10 MED ORDER — PROPOFOL 10 MG/ML IV BOLUS
INTRAVENOUS | Status: DC | PRN
  Administered 2013-11-10: 150 mg via INTRAVENOUS

## 2013-11-10 MED ORDER — LIDOCAINE HCL 4 % MT SOLN
OROMUCOSAL | Status: DC | PRN
  Administered 2013-11-10: 3 mL via TOPICAL

## 2013-11-10 MED ORDER — EPINEPHRINE HCL 1 MG/ML IJ SOLN
INTRAMUSCULAR | Status: DC | PRN
  Administered 2013-11-10: 1 mg

## 2013-11-10 MED ORDER — SUCCINYLCHOLINE CHLORIDE 20 MG/ML IJ SOLN
INTRAMUSCULAR | Status: DC | PRN
  Administered 2013-11-10: 100 mg via INTRAVENOUS

## 2013-11-10 MED ORDER — OXYCODONE HCL 5 MG PO TABS: 5.0000 mg | ORAL_TABLET | Freq: Once | ORAL | Status: DC | PRN

## 2013-11-10 MED ORDER — PROPOFOL 10 MG/ML IV BOLUS
INTRAVENOUS | Status: AC
  Filled 2013-11-10: qty 60

## 2013-11-10 MED ORDER — FENTANYL CITRATE 0.05 MG/ML IJ SOLN
INTRAMUSCULAR | Status: AC
  Filled 2013-11-10: qty 2

## 2013-11-10 MED ORDER — SCOPOLAMINE 1 MG/3DAYS TD PT72
1.0000 | MEDICATED_PATCH | Freq: Once | TRANSDERMAL | Status: DC
  Administered 2013-11-10: 1.5 mg via TRANSDERMAL

## 2013-11-10 MED ORDER — METHOCARBAMOL 750 MG PO TABS: 750.0000 mg | ORAL_TABLET | Freq: Three times a day (TID) | ORAL | Status: DC | PRN

## 2013-11-10 MED ORDER — DEXAMETHASONE SODIUM PHOSPHATE 4 MG/ML IJ SOLN
INTRAMUSCULAR | Status: DC | PRN
  Administered 2013-11-10: 10 mg via INTRAVENOUS

## 2013-11-10 MED ORDER — MIDAZOLAM HCL 2 MG/2ML IJ SOLN
INTRAMUSCULAR | Status: AC
  Filled 2013-11-10: qty 2

## 2013-11-10 MED ORDER — OXYCODONE-ACETAMINOPHEN 5-325 MG PO TABS: 1.0000 | ORAL_TABLET | Freq: Four times a day (QID) | ORAL | Status: DC | PRN

## 2013-11-10 MED ORDER — HYDROMORPHONE HCL PF 1 MG/ML IJ SOLN
INTRAMUSCULAR | Status: AC
  Filled 2013-11-10: qty 1

## 2013-11-10 MED ORDER — OXYCODONE HCL 5 MG/5ML PO SOLN: 5.0000 mg | Freq: Once | ORAL | Status: DC | PRN

## 2013-11-10 MED ORDER — SCOPOLAMINE 1 MG/3DAYS TD PT72
MEDICATED_PATCH | TRANSDERMAL | Status: AC
  Filled 2013-11-10: qty 1

## 2013-11-10 MED ORDER — LIDOCAINE HCL (CARDIAC) 20 MG/ML IV SOLN
INTRAVENOUS | Status: DC | PRN
  Administered 2013-11-10: 30 mg via INTRAVENOUS

## 2013-11-10 MED ORDER — FENTANYL CITRATE 0.05 MG/ML IJ SOLN
50.0000 ug | INTRAMUSCULAR | Status: DC | PRN
  Administered 2013-11-10: 100 ug via INTRAVENOUS

## 2013-11-10 MED ORDER — EPINEPHRINE HCL 1 MG/ML IJ SOLN
INTRAMUSCULAR | Status: AC
  Filled 2013-11-10: qty 1

## 2013-11-10 MED ORDER — CHLORHEXIDINE GLUCONATE 4 % EX LIQD: 60.0000 mL | Freq: Once | CUTANEOUS | Status: DC

## 2013-11-10 MED ORDER — HYDROMORPHONE HCL PF 1 MG/ML IJ SOLN
0.2500 mg | INTRAMUSCULAR | Status: DC | PRN
  Administered 2013-11-10 (×4): 0.5 mg via INTRAVENOUS

## 2013-11-10 MED ORDER — SODIUM CHLORIDE 0.9 % IR SOLN
Status: DC | PRN
  Administered 2013-11-10: 3000 mL

## 2013-11-10 MED ORDER — BUPIVACAINE-EPINEPHRINE (PF) 0.5% -1:200000 IJ SOLN
INTRAMUSCULAR | Status: DC | PRN
  Administered 2013-11-10: 30 mL via PERINEURAL

## 2013-11-10 MED ORDER — CEFAZOLIN SODIUM-DEXTROSE 2-3 GM-% IV SOLR
INTRAVENOUS | Status: AC
  Filled 2013-11-10: qty 50

## 2013-11-10 SURGICAL SUPPLY — 80 items
ANCH SUT 2 FT CRKSW 14.7X5.5 (Anchor) ×2 IMPLANT
ANCH SUT PUSHLCK 24X4.5 STRL (Orthopedic Implant) ×1 IMPLANT
ANCHOR CORKSCREW FIBER 5.5X15 (Anchor) ×2 IMPLANT
APL SKNCLS STERI-STRIP NONHPOA (GAUZE/BANDAGES/DRESSINGS) ×1
BENZOIN TINCTURE PRP APPL 2/3 (GAUZE/BANDAGES/DRESSINGS) ×1 IMPLANT
BLADE SURG 15 STRL LF DISP TIS (BLADE) IMPLANT
BLADE SURG 15 STRL SS (BLADE) ×2
BLADE VORTEX 6.0 (BLADE) ×2 IMPLANT
CANISTER SUCT 3000ML (MISCELLANEOUS) IMPLANT
CANNULA 5.75X71 LONG (CANNULA) IMPLANT
CANNULA TWIST IN 8.25X7CM (CANNULA) IMPLANT
CUTTER MENISCUS  4.2MM (BLADE) ×1
CUTTER MENISCUS 4.2MM (BLADE) ×1 IMPLANT
DECANTER SPIKE VIAL GLASS SM (MISCELLANEOUS) IMPLANT
DRAPE INCISE IOBAN 66X45 STRL (DRAPES) ×2 IMPLANT
DRAPE STERI 35X30 U-POUCH (DRAPES) ×2 IMPLANT
DRAPE SURG 17X23 STRL (DRAPES) ×2 IMPLANT
DRAPE U-SHAPE 47X51 STRL (DRAPES) ×2 IMPLANT
DRAPE U-SHAPE 76X120 STRL (DRAPES) ×4 IMPLANT
DRSG EMULSION OIL 3X3 NADH (GAUZE/BANDAGES/DRESSINGS) ×2 IMPLANT
DRSG PAD ABDOMINAL 8X10 ST (GAUZE/BANDAGES/DRESSINGS) ×3 IMPLANT
DURAPREP 26ML APPLICATOR (WOUND CARE) ×2 IMPLANT
ELECT REM PT RETURN 9FT ADLT (ELECTROSURGICAL) ×2
ELECTRODE REM PT RTRN 9FT ADLT (ELECTROSURGICAL) ×1 IMPLANT
GAUZE SPONGE 4X4 12PLY STRL (GAUZE/BANDAGES/DRESSINGS) ×2 IMPLANT
GLOVE BIOGEL PI IND STRL 7.0 (GLOVE) IMPLANT
GLOVE BIOGEL PI IND STRL 8 (GLOVE) ×2 IMPLANT
GLOVE BIOGEL PI INDICATOR 7.0 (GLOVE) ×1
GLOVE BIOGEL PI INDICATOR 8 (GLOVE) ×2
GLOVE ECLIPSE 6.5 STRL STRAW (GLOVE) ×1 IMPLANT
GLOVE ECLIPSE 7.5 STRL STRAW (GLOVE) ×4 IMPLANT
GOWN STRL REUS W/ TWL LRG LVL3 (GOWN DISPOSABLE) ×1 IMPLANT
GOWN STRL REUS W/ TWL XL LVL3 (GOWN DISPOSABLE) ×1 IMPLANT
GOWN STRL REUS W/TWL LRG LVL3 (GOWN DISPOSABLE) ×4
GOWN STRL REUS W/TWL XL LVL3 (GOWN DISPOSABLE) ×4 IMPLANT
IV NS IRRIG 3000ML ARTHROMATIC (IV SOLUTION) ×4 IMPLANT
MANIFOLD NEPTUNE II (INSTRUMENTS) ×2 IMPLANT
NDL 1/2 CIR CATGUT .05X1.09 (NEEDLE) IMPLANT
NDL HYPO 18GX1.5 BLUNT FILL (NEEDLE) ×1 IMPLANT
NDL SCORPION MULTI FIRE (NEEDLE) IMPLANT
NDL SUT 6 .5 CRC .975X.05 MAYO (NEEDLE) IMPLANT
NEEDLE 1/2 CIR CATGUT .05X1.09 (NEEDLE) IMPLANT
NEEDLE HYPO 18GX1.5 BLUNT FILL (NEEDLE) ×2 IMPLANT
NEEDLE MAYO TAPER (NEEDLE)
NEEDLE SCORPION MULTI FIRE (NEEDLE) IMPLANT
NS IRRIG 1000ML POUR BTL (IV SOLUTION) IMPLANT
PACK ARTHROSCOPY DSU (CUSTOM PROCEDURE TRAY) ×2 IMPLANT
PACK BASIN DAY SURGERY FS (CUSTOM PROCEDURE TRAY) ×2 IMPLANT
PASSER SUT SWANSON 36MM LOOP (INSTRUMENTS) IMPLANT
PENCIL BUTTON HOLSTER BLD 10FT (ELECTRODE) ×1 IMPLANT
PUSHLOCK PEEK 4.5X24 (Orthopedic Implant) ×1 IMPLANT
SET IRRIG Y TYPE TUR BLADDER L (SET/KITS/TRAYS/PACK) ×2 IMPLANT
SLEEVE SCD COMPRESS KNEE MED (MISCELLANEOUS) ×2 IMPLANT
SLING ARM IMMOBILIZER LRG (SOFTGOODS) IMPLANT
SLING ARM LRG ADULT FOAM STRAP (SOFTGOODS) ×1 IMPLANT
SLING ARM MED ADULT FOAM STRAP (SOFTGOODS) IMPLANT
SLING ARM XL FOAM STRAP (SOFTGOODS) IMPLANT
SPONGE LAP 4X18 X RAY DECT (DISPOSABLE) ×1 IMPLANT
STRIP CLOSURE SKIN 1/2X4 (GAUZE/BANDAGES/DRESSINGS) ×1 IMPLANT
SUCTION FRAZIER TIP 10 FR DISP (SUCTIONS) ×1 IMPLANT
SUT ETHIBOND 2 OS 4 DA (SUTURE) IMPLANT
SUT ETHILON 4 0 PS 2 18 (SUTURE) IMPLANT
SUT MNCRL AB 3-0 PS2 18 (SUTURE) ×1 IMPLANT
SUT PDS AB 0 CT 36 (SUTURE) IMPLANT
SUT TICRON 1 T 12 (SUTURE) IMPLANT
SUT TIGER TAPE 7 IN WHITE (SUTURE) IMPLANT
SUT VIC AB 0 CT1 27 (SUTURE) ×2
SUT VIC AB 0 CT1 27XBRD ANBCTR (SUTURE) IMPLANT
SUT VIC AB 1 CT1 27 (SUTURE)
SUT VIC AB 1 CT1 27XBRD ANBCTR (SUTURE) IMPLANT
SUT VIC AB 2-0 SH 27 (SUTURE) ×2
SUT VIC AB 2-0 SH 27XBRD (SUTURE) IMPLANT
SYR 5ML LL (SYRINGE) ×2 IMPLANT
TAPE FIBER 2MM 7IN #2 BLUE (SUTURE) IMPLANT
TOWEL OR 17X24 6PK STRL BLUE (TOWEL DISPOSABLE) ×2 IMPLANT
TOWEL OR NON WOVEN STRL DISP B (DISPOSABLE) ×2 IMPLANT
TUBE CONNECTING 20X1/4 (TUBING) ×1 IMPLANT
WAND STAR VAC 90 (SURGICAL WAND) ×2 IMPLANT
WATER STERILE IRR 1000ML POUR (IV SOLUTION) ×2 IMPLANT
YANKAUER SUCT BULB TIP NO VENT (SUCTIONS) IMPLANT

## 2013-11-10 NOTE — H&P (Signed)
PREOPERATIVE H&P  Chief Complaint: l shoulder pain  HPI: [name] is a 53 y.o. female who presents for evaluation of l shoulder pain s/p previous scope with resolution of symptoms and recurrent injury at work and new onset pain. It has been present for several months and has been worsening. She has failed conservative measures. Pain is rated as moderate.  Past Medical History  Diagnosis Date  . No pertinent past medical history   . Fibromyalgia   . PONV (postoperative nausea and vomiting)     used a scop patch last surg-did well  . Hyperlipidemia   . COPD (chronic obstructive pulmonary disease)   . GERD (gastroesophageal reflux disease)   . Prediabetes   . Bipolar 2 disorder, major depressive episode     denies  . Thyroid disease   . Restless leg syndrome   . Hypertension     normal now, only took medications for 1 month- thinks it was from pain.  Marland Kitchen Hypothyroidism   . Hemorrhoid   . Cancer     squamopus   Past Surgical History  Procedure Laterality Date  . Ankle fusion  2011    right  . Cholecystectomy    . Dilation and curettage of uterus    . Tubal ligation    . Wrist fracture surgery  2004    left  . Endometrial ablation  2008  . Tonsillectomy    . Shoulder arthroscopy  8/13    left-DSC  . Knee arthroscopy with medial menisectomy Right 05/22/2012    Procedure: KNEE ARTHROSCOPY WITH MEDIAL MENISECTOMY and Medial Plica excision;  Surgeon: Alta Corning, MD;  Location: [facility];  Service: Orthopedics;  Laterality: Right;  . Skin cancer excision      leg  . Eye surgery Bilateral     rt cataract  . Anterior cervical decomp/discectomy fusion N/A 09/01/2013    Procedure: Anterior cervical decompression fusion, cervical 5-6 with instrumentation and allograft;  Surgeon: Sinclair Ship, MD;  Location: [facility];  Service: Orthopedics;  Laterality: N/A;  Anterior cervical decompression fusion, cervical 5-6 with instrumentation and allograft    History   Social History  . Marital Status: Married    Spouse Name: N/A    Number of Children: N/A  . Years of Education: N/A   Social History Main Topics  . Smoking status: Former Smoker -- 1.00 packs/day for 30 years    Quit date: 11/24/2009  . Smokeless tobacco: Never Used  . Alcohol Use: No  . Drug Use: No  . Sexual Activity: Not Currently   Other Topics Concern  . None   Social History Narrative  . None   Family History  Problem Relation Age of Onset  . Cancer Father     Lung and stomach   . Heart disease Father   . Hypertension Father   . Stroke Father   . Heart failure Paternal Grandfather   . Cancer Paternal Grandfather     colon cancer   Allergies  Allergen Reactions  . Sulfa Antibiotics Hives and Itching   Prior to Admission medications   Medication Sig Start Date End Date Taking? Authorizing Provider  amphetamine-dextroamphetamine (ADDERALL) 20 MG tablet Take 1 tablet (20 mg total) by mouth 2 (two) times daily. 11/01/13  Yes Vicie Mutters, PA-C  furosemide (LASIX) 40 MG tablet 1/2-1 tablet daily 11/01/13 11/02/14 Yes Vicie Mutters, PA-C  oxyCODONE-acetaminophen (PERCOCET/ROXICET) 5-325 MG per tablet Take by mouth every 4 (four) hours as needed  for severe pain.   Yes Historical Provider, MD  cholecalciferol (VITAMIN D) 1000 UNITS tablet Take 7,000 Units by mouth at bedtime.     Historical Provider, MD  clonazePAM (KLONOPIN) 2 MG tablet Take 1 tablet (2 mg total) by mouth 2 (two) times daily. 10/04/13   Vicie Mutters, PA-C  escitalopram (LEXAPRO) 20 MG tablet 1/2-1 pill in the morning for anxiety 09/30/13 10/30/13  Vicie Mutters, PA-C  ferrous sulfate 325 (65 FE) MG tablet Take 325 mg by mouth at bedtime.    Historical Provider, MD  levothyroxine (SYNTHROID, LEVOTHROID) 75 MCG tablet Take 75 mcg by mouth daily before breakfast.    Historical Provider, MD  Magnesium 250 MG TABS Take 250 mg by mouth at bedtime.    Historical Provider, MD  pregabalin (LYRICA)  200 MG capsule Take 1 capsule (200 mg total) by mouth 3 (three) times daily. 09/30/13   Vicie Mutters, PA-C     Positive ROS: none  All other systems have been reviewed and were otherwise negative with the exception of those mentioned in the HPI and as above.  Physical Exam: Filed Vitals:   11/10/13 0810  BP: 144/85  Pulse: 91  Temp:   Resp:     General: Alert, no acute distress Cardiovascular: No pedal edema Respiratory: No cyanosis, no use of accessory musculature GI: No organomegaly, abdomen is soft and non-tender Skin: No lesions in the area of chief complaint Neurologic: Sensation intact distally Psychiatric: Patient is competent for consent with normal mood and affect Lymphatic: No axillary or cervical lymphadenopathy  MUSCULOSKELETAL: impingement and painful rom l shoulder  Mri: partial rc injury  Assessment/Plan: shoulder impingement on the left Plan for Procedure(s): SHOULDER ARTHROSCOPY WITH SUBACROMIAL DECOMPRESSION  The risks benefits and alternatives were discussed with the patient including but not limited to the risks of nonoperative treatment, versus surgical intervention including infection, bleeding, nerve injury, malunion, nonunion, hardware prominence, hardware failure, need for hardware removal, blood clots, cardiopulmonary complications, morbidity, mortality, among others, and they were willing to proceed.  Predicted outcome is good, although there will be at least a six to nine month expected recovery.  Prima Rayner L, MD 11/10/2013 8:29 AM

## 2013-11-10 NOTE — Anesthesia Postprocedure Evaluation (Signed)
  Anesthesia Post-op Note  Patient: [name]  Procedure(s) Performed: Procedure(s): SHOULDER ARTHROSCOPY WITH SUBACROMIAL DECOMPRESSION WITH ROTATOR CUFF REPAIR AND BICEPS TENODESIS (Left)  Patient Location: PACU  Anesthesia Type:General and block  Level of Consciousness: awake and alert   Airway and Oxygen Therapy: Patient Spontanous Breathing  Post-op Pain: mild  Post-op Assessment: Post-op Vital signs reviewed, Patient's Cardiovascular Status Stable and Respiratory Function Stable  Post-op Vital Signs: Reviewed  Filed Vitals:   11/10/13 1115  BP: 147/67  Pulse: 64  Temp:   Resp: 22    Complications: No apparent anesthesia complications

## 2013-11-10 NOTE — Anesthesia Procedure Notes (Addendum)
Anesthesia Regional Block:  Interscalene brachial plexus block  Pre-Anesthetic Checklist: ,, timeout performed, Correct Patient, Correct Site, Correct Laterality, Correct Procedure, Correct Position, site marked, Risks and benefits discussed, pre-op evaluation,  At surgeon's request and post-op pain management  Laterality: Left  Prep: Maximum Sterile Barrier Precautions used and chloraprep       Needles:  Injection technique: Single-shot  Needle Type: Echogenic Stimulator Needle     Needle Length: 5cm 5 cm Needle Gauge: 22 and 22 G    Additional Needles:  Procedures: ultrasound guided (picture in chart) and nerve stimulator Interscalene brachial plexus block  Nerve Stimulator or Paresthesia:  Response: Biceps response,   Additional Responses:   Narrative:  Start time: 11/10/2013 7:55 AM End time: 11/10/2013 8:05 AM Injection made incrementally with aspirations every 5 mL. Anesthesiologist: Ola Spurr, MD  Additional Notes: 2% Lidocaine skin wheel.    Procedure Name: Intubation Date/Time: 11/10/2013 8:43 AM Performed by: Maryella Shivers Pre-anesthesia Checklist: Patient identified, Emergency Drugs available, Suction available and Patient being monitored Patient Re-evaluated:Patient Re-evaluated prior to inductionOxygen Delivery Method: Circle System Utilized Preoxygenation: Pre-oxygenation with 100% oxygen Intubation Type: IV induction Ventilation: Mask ventilation without difficulty Laryngoscope Size: Mac and 3 Grade View: Grade I Tube type: Oral Tube size: 7.0 mm Number of attempts: 1 Airway Equipment and Method: stylet and oral airway Placement Confirmation: ETT inserted through vocal cords under direct vision,  positive ETCO2 and breath sounds checked- equal and bilateral Secured at: 21 cm Tube secured with: Tape Dental Injury: Teeth and Oropharynx as per pre-operative assessment

## 2013-11-10 NOTE — Anesthesia Preprocedure Evaluation (Addendum)
Anesthesia Evaluation    History of Anesthesia Complications (+) PONV  Airway Mallampati: II TM Distance: >3 FB Neck ROM: Full    Dental  (+) Teeth Intact, Dental Advisory Given   Pulmonary neg COPDformer smoker,  breath sounds clear to auscultation        Cardiovascular negative cardio ROS  Rhythm:Regular Rate:Normal     Neuro/Psych PSYCHIATRIC DISORDERS Depression Bipolar Disorder    GI/Hepatic   Endo/Other  Hypothyroidism   Renal/GU      Musculoskeletal  (+) Fibromyalgia -  Abdominal   Peds  Hematology   Anesthesia Other Findings   Reproductive/Obstetrics                          Anesthesia Physical Anesthesia Plan  ASA: II  Anesthesia Plan: General and Regional   Post-op Pain Management:    Induction: Intravenous  Airway Management Planned: Oral ETT  Additional Equipment:   Intra-op Plan:   Post-operative Plan: Extubation in OR  Informed Consent: I have reviewed the patients History and Physical, chart, labs and discussed the procedure including the risks, benefits and alternatives for the proposed anesthesia with the patient or authorized representative who has indicated his/her understanding and acceptance.   Dental advisory given  Plan Discussed with: CRNA  Anesthesia Plan Comments:         Anesthesia Quick Evaluation

## 2013-11-10 NOTE — Discharge Instructions (Signed)
Wear sling. You may remove it for gentle shoulder circumduction exercises and range of motion of your elbow. Apply ice to your right shoulder x3-4 days. Use the pain medication and muscle relaxers as needed. Keep your shoulder dressing in place until you return to the office.  Regional Anesthesia Blocks  1. Numbness or the inability to move the "blocked" extremity may last from 3-48 hours after placement. The length of time depends on the medication injected and your individual response to the medication. If the numbness is not going away after 48 hours, call your surgeon.  2. The extremity that is blocked will need to be protected until the numbness is gone and the  Strength has returned. Because you cannot feel it, you will need to take extra care to avoid injury. Because it may be weak, you may have difficulty moving it or using it. You may not know what position it is in without looking at it while the block is in effect.  3. For blocks in the legs and feet, returning to weight bearing and walking needs to be done carefully. You will need to wait until the numbness is entirely gone and the strength has returned. You should be able to move your leg and foot normally before you try and bear weight or walk. You will need someone to be with you when you first try to ensure you do not fall and possibly risk injury.  4. Bruising and tenderness at the needle site are common side effects and will resolve in a few days.  5. Persistent numbness or new problems with movement should be communicated to the surgeon or the [facility] [phone] [place] [phone]).   Post Anesthesia Home Care Instructions  Activity: Get plenty of rest for the remainder of the day. A responsible adult should stay with you for 24 hours following the procedure.  For the next 24 hours, DO NOT: -Drive a car -Paediatric nurse -Drink alcoholic beverages -Take any medication unless  instructed by your physician -Make any legal decisions or sign important papers.  Meals: Start with liquid foods such as gelatin or soup. Progress to regular foods as tolerated. Avoid greasy, spicy, heavy foods. If nausea and/or vomiting occur, drink only clear liquids until the nausea and/or vomiting subsides. Call your physician if vomiting continues.  Special Instructions/Symptoms: Your throat may feel dry or sore from the anesthesia or the breathing tube placed in your throat during surgery. If this causes discomfort, gargle with warm salt water. The discomfort should disappear within 24 hours.

## 2013-11-10 NOTE — Brief Op Note (Signed)
11/10/2013  9:41 AM  PATIENT:  Thelma Barge  53 y.o. female  PRE-OPERATIVE DIAGNOSIS:  shoulder impingement on the left  POST-OPERATIVE DIAGNOSIS:  Left Shoulder Impingement and Rotator Cuff Tear  PROCEDURE:  Procedure(s): SHOULDER ARTHROSCOPY WITH SUBACROMIAL DECOMPRESSION WITH ROTATOR CUFF REPAIR AND BICEPS TENODESIS (Left)  SURGEON:  Surgeon(s) and Role:    * Alta Corning, MD - Primary  PHYSICIAN ASSISTANT:   ASSISTANTS: bethune   ANESTHESIA:   general  EBL:  Total I/O In: 1000 [I.V.:1000] Out: -   BLOOD ADMINISTERED:none  DRAINS: none   LOCAL MEDICATIONS USED:  NONE  SPECIMEN:  No Specimen  DISPOSITION OF SPECIMEN:  N/A  COUNTS:  YES  TOURNIQUET:  * No tourniquets in log *  DICTATION: .Other Dictation: Dictation Number [id number]  PLAN OF CARE: Discharge to home after PACU  PATIENT DISPOSITION:  PACU - hemodynamically stable.   Delay start of Pharmacological VTE agent (>24hrs) due to surgical blood loss or risk of bleeding: no

## 2013-11-10 NOTE — Transfer of Care (Signed)
Immediate Anesthesia Transfer of Care Note  Patient: [name]  Procedure(s) Performed: Procedure(s): SHOULDER ARTHROSCOPY WITH SUBACROMIAL DECOMPRESSION WITH ROTATOR CUFF REPAIR AND BICEPS TENODESIS (Left)  Patient Location: PACU  Anesthesia Type:GA combined with regional for post-op pain  Level of Consciousness: awake, alert  and oriented  Airway & Oxygen Therapy: Patient Spontanous Breathing and Patient connected to face mask oxygen  Post-op Assessment: Report given to PACU RN and Post -op Vital signs reviewed and stable  Post vital signs: Reviewed and stable  Complications: No apparent anesthesia complications

## 2013-11-10 NOTE — Progress Notes (Signed)
Assisted Dr. Fitzgerald with left, ultrasound guided, interscalene  block. Side rails up, monitors on throughout procedure. See vital signs in flow sheet. Tolerated Procedure well. 

## 2013-11-11 ENCOUNTER — Encounter (HOSPITAL_BASED_OUTPATIENT_CLINIC_OR_DEPARTMENT_OTHER): Payer: Self-pay | Admitting: Orthopedic Surgery

## 2013-11-11 NOTE — Op Note (Signed)
[name], [name]             ACCOUNT NO.:  [id number]  MEDICAL RECORD NO.:  [id number]  LOCATION:                                 FACILITY:  PHYSICIAN:  Alta Corning, M.D.   DATE OF BIRTH:  1960-05-03  DATE OF PROCEDURE:  11/10/2013 DATE OF DISCHARGE:  11/10/2013                              OPERATIVE REPORT   PREOPERATIVE DIAGNOSIS:  Impingement with questionable rotator cuff tear.  POSTOPERATIVE DIAGNOSES: 1. Impending biceps tendon rupture. 2. Rotator cuff tear of the supraspinatus. 3. Superior labral tear anterior to posterior.  PROCEDURES: 1. Mini open rotator cuff repair of an acutely torn rotator cuff. 2. Open biceps tenodesis. 3. Extensive debridement within the glenohumeral joint of the superior     labral tear anterior to posterior and released the biceps tendon.  SURGEON:  Alta Corning, MD.  ASSISTANT:  Gary Fleet, PA.  ANESTHESIA:  General.  BRIEF HISTORY:  [name] is a 53 year old female who had undergone an acromioplasty, distal clavicle resection and debridement several years ago.  She had a fall at work in January and that was treated conservatively for 8 months.  With a persistent pain and failure of conservative care, she was taken to the operating room for arthroscopy. MRI showed deep rotator cuff partial thickness tears, and she was brought to the operating room for evaluation and fixation as needed.  DESCRIPTION OF PROCEDURE:  The patient was brought to the operating room.  After adequate anesthesia was obtained with general anesthetic, the patient was placed supine on the operating table.  The left arm was prepped and draped in usual sterile fashion.  After that, the patient was anesthetized and placed in the beach chair position.  All bony prominences were then well padded.  At this time, attention was turned to the left shoulder which underwent routine arthroscopy of the glenohumeral joint.  It showed that there was an obvious  superior labral tear anterior to posterior and the biceps tendon had an impending rupture.  At that point, we looked to the glenohumeral joint which was pristine.  Took a look at the rotator cuff where the subscap was intact, but the supraspinatus had obvious high grade partial injury.  We debrided this up, and as we debrided up, it became clear that there was an impending rupture of the supraspinatus. At this point, we released the biceps tendon from the superior labrum and debrided the superior labrum back to the glenoid rim.  We debrided the supraspinatus tendon little bit further on the undersurface, and then at this point, abandoned the arthroscopic portion of the case.  A small incision was made then laterally subcutaneous tissue down all the deltoid.  The deltoid was divided in line with its fibers, and at that point, we got access underneath the biceps.  Bicipital groove was identified and opened, and the biceps tendon was pulled out of this area and tagged.  The supraspinatus then just leading away from this posteriorly was elevated, and obviously, it was clear that it had a very impending rupture at that point.  Once we elevated about a inch of supraspinatus, we then put an anchor in this area and put 2  stitches to hold this down.  Excellent repair of the supraspinatus was achieved.  At this point, we put a lateral push lock to get a double row repair.  Once this was done, attention was turned to the biceps tendon where we put a biceps tenodesis anchor in and then did 2 stitches to the biceps tendon hole in place and then cut the remaining portion of the biceps tendon off.  The arm was held in extension with the biceps tendon being pulled proximally when this was repaired.  At this point, a gloved finger will be placed up in the subacromial space with an excellent acromioplasty and distal clavicle resection having been performed previously. Remainder of the rotator cuff looked  good.  At this point, again irrigated and suctioned dry.  Deltoid was closed.  The skin was closed with 2-0 Vicryl and 3-0 Monocryl subcuticular.  Benzoin and Steri-Strips were applied.  Sterile compressive dressing was applied.  The patient was taken to the recovery room and was noted to be in satisfactory condition.  Estimated blood loss for the procedure was minimal.     Alta Corning, M.D.     Corliss Skains  D:  11/10/2013  T:  11/10/2013  Job:  [id number]

## 2013-12-06 ENCOUNTER — Other Ambulatory Visit: Payer: Self-pay | Admitting: Physician Assistant

## 2013-12-06 MED ORDER — AMPHETAMINE-DEXTROAMPHETAMINE 20 MG PO TABS
20.0000 mg | ORAL_TABLET | Freq: Two times a day (BID) | ORAL | Status: DC
Start: 2013-12-06 — End: 2014-01-14

## 2013-12-07 ENCOUNTER — Other Ambulatory Visit: Payer: 59

## 2013-12-07 DIAGNOSIS — E039 Hypothyroidism, unspecified: Secondary | ICD-10-CM

## 2013-12-07 LAB — TSH: TSH: 3.806 u[IU]/mL (ref 0.350–4.500)

## 2013-12-11 DIAGNOSIS — S32409A Unspecified fracture of unspecified acetabulum, initial encounter for closed fracture: Secondary | ICD-10-CM | POA: Insufficient documentation

## 2013-12-31 ENCOUNTER — Other Ambulatory Visit: Payer: Self-pay | Admitting: Urology

## 2013-12-31 MED ORDER — MITOMYCIN CHEMO FOR BLADDER INSTILLATION 40 MG: 40.0000 mg | Freq: Once | INTRAVENOUS | Status: DC

## 2014-01-05 ENCOUNTER — Encounter (HOSPITAL_BASED_OUTPATIENT_CLINIC_OR_DEPARTMENT_OTHER): Payer: Self-pay | Admitting: *Deleted

## 2014-01-06 ENCOUNTER — Encounter (HOSPITAL_BASED_OUTPATIENT_CLINIC_OR_DEPARTMENT_OTHER): Payer: Self-pay | Admitting: *Deleted

## 2014-01-07 ENCOUNTER — Encounter (HOSPITAL_BASED_OUTPATIENT_CLINIC_OR_DEPARTMENT_OTHER): Payer: Self-pay | Admitting: *Deleted

## 2014-01-07 NOTE — Progress Notes (Signed)
NPO AFTER MN.  ARRIVE AT 0815.  NEEDS ISTAT.  CURRENT EKG IN CHART AND EPIC.   

## 2014-01-09 NOTE — H&P (Signed)
History of Present Illness This is a new patient for me. She previously saw Dr. Rossie Muskrat. PCP is Dr. Unk Pinto.     #1-bladder neoplasm-September 2015-patient with a small papillary tumor at the left trigone near the left UO. She's had no dysuria or gross hematuria. She quit smoking in 2000. She's had no exposures to chemotherapy, radiation or chemicals.    #2 urinary incontinence - Apr 2015 - began following the fall in January 2015 an fractured sacrum. She has urge incontinence and stress incontinence. The stress incontinence seems to be slightly worse in the urge incontinence. Severity: she requires 5 pads per day. Is not associated with dysuria. She denies any personal history of glaucoma, dry mouth, or constipation. 06/29/13: Cr 0.63, eGFR >89.  She was referred to PT but declined to attend.     #3 frequency, nocturia - Apr 2015 - began in January 2015. Severity: She has to void 4 times an hour. Timing: This occurs at any time of the day. This is associated with small volume voids. Nothing makes it better or worse. Nocturia x 5 times per night. This is associated with nocturnal enuresis. Started on oxybutynin.     #4 cystocele - Apr 2015 - grade I/IV; This is not associated with any appreciable urethral hypermobility. Negative bladder mass on exam.     Today, patient is seen for the above. She tried oxybutynin but continues to leak when she coughs, sneezes or strains. She also leaks without awareness and will wake up at night and begin to leak and have to get to the bathroom to empty. The incontinence is most bothersome to her. After starting oxybutynin she does not have frequency or urgency. She has no dysuria or gross hematuria. NSVD x2. No other pelvic surgery. No prolapse symptoms.    The UA looks clear today for infection or hematuria.   Past Medical History Problems  1. History of Bipolar 2 disorder (296.89) 2. History of arthritis (V13.4) 3. History of chronic  obstructive lung disease (V12.69) 4. History of depression (V11.8) 5. History of esophageal reflux (V12.79) 6. History of fibromyalgia (V13.59) 7. History of hyperlipidemia (V12.29) 8. History of hypertension (V12.59) 9. History of malignant melanoma (V10.82) 10. History of Prediabetes (790.29) 11. History of Restless leg syndrome (333.94)  Surgical History Problems  1. History of Ankle Surgery 2. History of Arthroscopy Knee Right 3. History of Arthroscopy Shoulder Left 4. History of Cataract Surgery 5. History of Cholecystectomy 6. History of Dilation And Curettage 7. History of Hysteroscopy With Endometrial Ablation 8. History of Wrist Surgery  Current Meds 1. Adderall 10 MG Oral Tablet;  Therapy: (Recorded:09Apr2015) to Recorded 2. KlonoPIN 1 MG Oral Tablet;  Therapy: (Recorded:09Apr2015) to Recorded 3. Levothyroxine Sodium 75 MCG Oral Tablet;  Therapy: (Recorded:09Apr2015) to Recorded 4. Lyrica 200 MG Oral Capsule;  Therapy: (Recorded:09Apr2015) to Recorded 5. Oxybutynin Chloride ER 5 MG Oral Tablet Extended Release 24 Hour; Take 1 tablet daily;  Therapy: 09Apr2015 to (Evaluate:03Apr2016)  Requested for: 09Apr2015; Last  Rx:09Apr2015 Ordered 6. Vitamin D TABS;  Therapy: (Recorded:09Apr2015) to Recorded  Allergies Medication  1. Sulfa Drugs 2. PPD  Family History Problems  1. Family history of Death of family member : Father   father deceased at age 63; lung cancer 2. Family history of cardiac disorder (V17.49) : Father 3. Family history of colon cancer (V16.0) : Grandfather 4. Family history of heart failure (V17.49) : Grandfather 5. Family history of hypertension (V17.49) : Father 6. Family history of lung cancer (  V16.1) : Mother 7. Family history of malignant neoplasm of stomach (V16.0) : Mother 8. Family history of stroke (V17.1) : Father  Social History Problems    Denied: History of Alcohol use   Caffeine use (V49.89)   1; coffee   Former smoker  [place])   smoked 1 ppd for 30 yrs & quit in 2011   Married   Occupation   logistic specialist  Vitals Vital Signs [Data Includes: Last 1 Day]  Recorded: 23Sep2015 08:34AM  Blood Pressure: 125 / 78 Temperature: 98.7 F Heart Rate: 66  Physical Exam Abdomen: The abdomen is soft and nontender. No masses are palpated. No CVA tenderness. No hernias are palpable. No hepatosplenomegaly noted.  Genitourinary:  Chaperone Present: Collie Siad.  Examination of the external genitalia shows normal female external genitalia and no lesions. The urethra is normal in appearance and not tender. There is no urethral mass. Urethral hypermobility is present. Vaginal exam demonstrates no abnormalities. The bladder is non tender, not distended and without masses. The anus is normal on inspection. The perineum is normal on inspection.  Neuro/Psych:. Mood and affect are appropriate.    Results/Data Urine [Data Includes: Last 1 Day]   23Sep2015  COLOR YELLOW   APPEARANCE CLEAR   SPECIFIC GRAVITY <1.005   pH 6.5   GLUCOSE NEG mg/dL  BILIRUBIN NEG   KETONE NEG mg/dL  BLOOD TRACE   PROTEIN NEG mg/dL  UROBILINOGEN 0.2 mg/dL  NITRITE NEG   LEUKOCYTE ESTERASE NEG   SQUAMOUS EPITHELIAL/HPF MODERATE   WBC 0-2 WBC/hpf  RBC NONE SEEN RBC/hpf  BACTERIA MODERATE   CRYSTALS NONE SEEN   CASTS NONE SEEN    Old records or history reviewed:Marland Kitchen    Procedure  Procedure: Cystoscopy  Chaperone Present: sue.  Indication: Lower Urinary Tract Symptoms.  Informed Consent: Risks, benefits, and potential adverse events were discussed and informed consent was obtained from the patient.  Prep: The patient was prepped with betadine.  Antibiotic prophylaxis: Ciprofloxacin.  Procedure Note:  Urethral meatus:. No abnormalities.  Anterior urethra: No abnormalities.  Bladder: Visulization was clear. The ureteral orifices were in the normal anatomic position bilaterally and had clear efflux of urine. A solitary tumor was  visualized in the bladder. A papillary tumor was seen in the bladder. This tumor was located on the left side, near the trigone of the bladder. The patient tolerated the procedure well.  Complications: None. Filled to about 300 mL, positive stress incontinence. Positive urethral hypermobility.    Assessment Assessed  1. Urinary incontinence (788.30) 2. Bladder neoplasm (239.4)  Plan Bladder neoplasm  1. Follow-up Schedule Surgery Office  Follow-up  Status: Hold For - Appointment   Requested for: 23Sep2015 2. AU CT-HEMATURIA PROTOCOL; Status:Hold For - Appointment,PreCert,Date of  Service,Print; Requested [id number];  3. BUN & CREATININE; Status:Hold For - Specimen/Data Collection,Appointment;  Requested [id number];  4. URINE CULTURE; Status:In Progress - Specimen/Data Collected;   Done: 23Sep2015 Health Maintenance  5. UA With REFLEX; [Do Not Release]; Status:Complete;   Done: [id number] 08:12AM Increased urinary frequency, Nocturia, Urinary incontinence  6. IPSS Questionnarie; Status:Hold For - Date of Service; Requested [id number];  7. PVR U/S; Status:Hold For - Date of Service; Requested [id number];  Urinary incontinence  8. Cysto; Status:Hold For - Appointment,Date of Service; Requested [id number];   Discussion/Summary Stress incontinence-we discussed the nature risk and benefits of PT and OT, injectables, mid urethral sling. We discussed FDA warnings on mid urethral sling, such as erosion, extrusion, dyspareunia among others. I think in the long run she  might be a good candidate for a sling but we need to get this small bladder tumor removed and evaluated. It is possible it is contributing to irritative symptoms.  Frequency, urgency-this is much improved on oxybutynin. Maybe something she needs to on even of stress incontinence were corrected.   Papillary bladder tumor - we discussed the nature risk and benefits of TURBT with mitomycin-C. She may need a left ureteral  stent. BUN and creatinine were sent and we'll set up a CT hematuria protocol to evaluate the upper tracts.      Signatures Electronically signed by : Festus Aloe, M.D.; Dec 29 2013  9:43AM EST

## 2014-01-11 ENCOUNTER — Encounter (HOSPITAL_BASED_OUTPATIENT_CLINIC_OR_DEPARTMENT_OTHER): Payer: Self-pay | Admitting: *Deleted

## 2014-01-11 ENCOUNTER — Ambulatory Visit (HOSPITAL_BASED_OUTPATIENT_CLINIC_OR_DEPARTMENT_OTHER)
Admission: RE | Admit: 2014-01-11 | Discharge: 2014-01-11 | Disposition: A | Discharge status: Home / Self Care | Payer: 59 | Source: Ambulatory Visit | Attending: Urology | Admitting: Urology

## 2014-01-11 ENCOUNTER — Encounter (HOSPITAL_BASED_OUTPATIENT_CLINIC_OR_DEPARTMENT_OTHER): Payer: 59 | Admitting: Anesthesiology

## 2014-01-11 ENCOUNTER — Encounter (HOSPITAL_BASED_OUTPATIENT_CLINIC_OR_DEPARTMENT_OTHER)
Admission: RE | Disposition: A | Discharge status: Home / Self Care | Payer: Self-pay | Source: Ambulatory Visit | Attending: Urology

## 2014-01-11 ENCOUNTER — Ambulatory Visit (HOSPITAL_BASED_OUTPATIENT_CLINIC_OR_DEPARTMENT_OTHER): Payer: 59 | Admitting: Anesthesiology

## 2014-01-11 DIAGNOSIS — M1990 Unspecified osteoarthritis, unspecified site: Secondary | ICD-10-CM | POA: Insufficient documentation

## 2014-01-11 DIAGNOSIS — J449 Chronic obstructive pulmonary disease, unspecified: Secondary | ICD-10-CM | POA: Insufficient documentation

## 2014-01-11 DIAGNOSIS — Z79899 Other long term (current) drug therapy: Secondary | ICD-10-CM | POA: Insufficient documentation

## 2014-01-11 DIAGNOSIS — F329 Major depressive disorder, single episode, unspecified: Secondary | ICD-10-CM | POA: Insufficient documentation

## 2014-01-11 DIAGNOSIS — M797 Fibromyalgia: Secondary | ICD-10-CM | POA: Insufficient documentation

## 2014-01-11 DIAGNOSIS — R32 Unspecified urinary incontinence: Secondary | ICD-10-CM | POA: Diagnosis not present

## 2014-01-11 DIAGNOSIS — F3181 Bipolar II disorder: Secondary | ICD-10-CM | POA: Insufficient documentation

## 2014-01-11 DIAGNOSIS — C679 Malignant neoplasm of bladder, unspecified: Secondary | ICD-10-CM | POA: Diagnosis not present

## 2014-01-11 DIAGNOSIS — D494 Neoplasm of unspecified behavior of bladder: Secondary | ICD-10-CM

## 2014-01-11 DIAGNOSIS — K219 Gastro-esophageal reflux disease without esophagitis: Secondary | ICD-10-CM | POA: Diagnosis not present

## 2014-01-11 DIAGNOSIS — E039 Hypothyroidism, unspecified: Secondary | ICD-10-CM | POA: Insufficient documentation

## 2014-01-11 DIAGNOSIS — G2581 Restless legs syndrome: Secondary | ICD-10-CM | POA: Insufficient documentation

## 2014-01-11 DIAGNOSIS — Z882 Allergy status to sulfonamides status: Secondary | ICD-10-CM | POA: Insufficient documentation

## 2014-01-11 DIAGNOSIS — I10 Essential (primary) hypertension: Secondary | ICD-10-CM | POA: Diagnosis not present

## 2014-01-11 DIAGNOSIS — Z87891 Personal history of nicotine dependence: Secondary | ICD-10-CM | POA: Insufficient documentation

## 2014-01-11 DIAGNOSIS — N8110 Cystocele, unspecified: Secondary | ICD-10-CM | POA: Insufficient documentation

## 2014-01-11 DIAGNOSIS — E785 Hyperlipidemia, unspecified: Secondary | ICD-10-CM | POA: Diagnosis not present

## 2014-01-11 HISTORY — DX: Major depressive disorder, single episode, unspecified: F32.9

## 2014-01-11 HISTORY — PX: TRANSURETHRAL RESECTION OF BLADDER TUMOR: SHX2575

## 2014-01-11 HISTORY — DX: Other specified behavioral and emotional disorders with onset usually occurring in childhood and adolescence: F98.8

## 2014-01-11 HISTORY — DX: Other specified postprocedural states: Z85.820

## 2014-01-11 HISTORY — DX: Anxiety disorder, unspecified: F41.9

## 2014-01-11 HISTORY — PX: CYSTOSCOPY W/ RETROGRADES: SHX1426

## 2014-01-11 HISTORY — DX: Depression, unspecified: F32.A

## 2014-01-11 HISTORY — DX: Other specified postprocedural states: Z98.890

## 2014-01-11 HISTORY — DX: Neoplasm of unspecified behavior of bladder: D49.4

## 2014-01-11 HISTORY — PX: CYSTOSCOPY WITH STENT PLACEMENT: SHX5790

## 2014-01-11 HISTORY — DX: Iron deficiency anemia, unspecified: D50.9

## 2014-01-11 LAB — POCT I-STAT 4, (NA,K, GLUC, HGB,HCT)
Glucose, Bld: 105 mg/dL — ABNORMAL HIGH (ref 70–99)
HCT: 41 % (ref 36.0–46.0)
Hemoglobin: 13.9 g/dL (ref 12.0–15.0)
Potassium: 4.4 mEq/L (ref 3.7–5.3)
Sodium: 139 mEq/L (ref 137–147)

## 2014-01-11 SURGERY — TURBT (TRANSURETHRAL RESECTION OF BLADDER TUMOR)
Anesthesia: General | Site: Ureter

## 2014-01-11 MED ORDER — SCOPOLAMINE 1 MG/3DAYS TD PT72
MEDICATED_PATCH | TRANSDERMAL | Status: AC
  Filled 2014-01-11: qty 1

## 2014-01-11 MED ORDER — MIDAZOLAM HCL 5 MG/5ML IJ SOLN
INTRAMUSCULAR | Status: DC | PRN
  Administered 2014-01-11: 2 mg via INTRAVENOUS

## 2014-01-11 MED ORDER — OXYBUTYNIN CHLORIDE 5 MG PO TABS: 5.0000 mg | ORAL_TABLET | Freq: Three times a day (TID) | ORAL | Status: DC | PRN

## 2014-01-11 MED ORDER — CEFAZOLIN SODIUM-DEXTROSE 2-3 GM-% IV SOLR
INTRAVENOUS | Status: AC
  Filled 2014-01-11: qty 50

## 2014-01-11 MED ORDER — STERILE WATER FOR IRRIGATION IR SOLN
Status: DC | PRN
  Administered 2014-01-11: 1000 mL

## 2014-01-11 MED ORDER — SCOPOLAMINE 1 MG/3DAYS TD PT72
1.0000 | MEDICATED_PATCH | TRANSDERMAL | Status: DC
  Administered 2014-01-11: 1.5 mg via TRANSDERMAL
  Filled 2014-01-11: qty 1

## 2014-01-11 MED ORDER — MITOMYCIN CHEMO FOR BLADDER INSTILLATION 40 MG
40.0000 mg | Freq: Once | INTRAVENOUS | Status: AC
  Administered 2014-01-11: 40 mg via INTRAVESICAL
  Filled 2014-01-11: qty 40

## 2014-01-11 MED ORDER — ONDANSETRON HCL 4 MG/2ML IJ SOLN
INTRAMUSCULAR | Status: DC | PRN
  Administered 2014-01-11: 4 mg via INTRAVENOUS

## 2014-01-11 MED ORDER — DEXAMETHASONE SODIUM PHOSPHATE 10 MG/ML IJ SOLN
INTRAMUSCULAR | Status: DC | PRN
  Administered 2014-01-11: 10 mg via INTRAVENOUS

## 2014-01-11 MED ORDER — PROMETHAZINE HCL 25 MG/ML IJ SOLN
6.2500 mg | INTRAMUSCULAR | Status: DC | PRN
  Filled 2014-01-11: qty 1

## 2014-01-11 MED ORDER — HYDROMORPHONE HCL 1 MG/ML IJ SOLN
0.2500 mg | INTRAMUSCULAR | Status: DC | PRN
  Administered 2014-01-11: 0.25 mg via INTRAVENOUS
  Filled 2014-01-11: qty 1

## 2014-01-11 MED ORDER — IOHEXOL 350 MG/ML SOLN
INTRAVENOUS | Status: DC | PRN
  Administered 2014-01-11: 2 mL via URETHRAL

## 2014-01-11 MED ORDER — METHYLENE BLUE 1 % INJ SOLN
INTRAMUSCULAR | Status: DC | PRN
  Administered 2014-01-11: 50 mg via INTRAVENOUS

## 2014-01-11 MED ORDER — OXYCODONE HCL 5 MG PO TABS
ORAL_TABLET | ORAL | Status: AC
Start: 2014-01-11 — End: 2014-01-11
  Filled 2014-01-11: qty 1

## 2014-01-11 MED ORDER — CEFAZOLIN SODIUM 1-5 GM-% IV SOLN
1.0000 g | INTRAVENOUS | Status: DC
  Filled 2014-01-11: qty 50

## 2014-01-11 MED ORDER — FENTANYL CITRATE 0.05 MG/ML IJ SOLN
INTRAMUSCULAR | Status: DC | PRN
  Administered 2014-01-11 (×2): 50 ug via INTRAVENOUS

## 2014-01-11 MED ORDER — OXYCODONE HCL 5 MG PO TABS
5.0000 mg | ORAL_TABLET | Freq: Once | ORAL | Status: AC | PRN
  Administered 2014-01-11: 5 mg via ORAL
  Filled 2014-01-11: qty 1

## 2014-01-11 MED ORDER — HYDROMORPHONE HCL 1 MG/ML IJ SOLN
INTRAMUSCULAR | Status: AC
  Filled 2014-01-11: qty 1

## 2014-01-11 MED ORDER — MEPERIDINE HCL 25 MG/ML IJ SOLN
6.2500 mg | INTRAMUSCULAR | Status: DC | PRN
  Filled 2014-01-11: qty 1

## 2014-01-11 MED ORDER — SODIUM CHLORIDE 0.9 % IR SOLN
Status: DC | PRN
  Administered 2014-01-11: 3000 mL

## 2014-01-11 MED ORDER — ACETAMINOPHEN 10 MG/ML IV SOLN
INTRAVENOUS | Status: DC | PRN
  Administered 2014-01-11: 1000 mg via INTRAVENOUS

## 2014-01-11 MED ORDER — OXYCODONE HCL 5 MG/5ML PO SOLN
5.0000 mg | Freq: Once | ORAL | Status: AC | PRN
  Filled 2014-01-11: qty 5

## 2014-01-11 MED ORDER — LIDOCAINE HCL (CARDIAC) 20 MG/ML IV SOLN
INTRAVENOUS | Status: DC | PRN
  Administered 2014-01-11: 80 mg via INTRAVENOUS

## 2014-01-11 MED ORDER — LACTATED RINGERS IV SOLN
INTRAVENOUS | Status: DC
  Administered 2014-01-11: 09:00:00 via INTRAVENOUS
  Filled 2014-01-11: qty 1000

## 2014-01-11 MED ORDER — CEFAZOLIN SODIUM-DEXTROSE 2-3 GM-% IV SOLR
2.0000 g | INTRAVENOUS | Status: AC
  Administered 2014-01-11: 2 g via INTRAVENOUS
  Filled 2014-01-11: qty 50

## 2014-01-11 MED ORDER — FENTANYL CITRATE 0.05 MG/ML IJ SOLN
INTRAMUSCULAR | Status: AC
  Filled 2014-01-11: qty 4

## 2014-01-11 MED ORDER — MIDAZOLAM HCL 2 MG/2ML IJ SOLN
INTRAMUSCULAR | Status: AC
  Filled 2014-01-11: qty 2

## 2014-01-11 MED ORDER — PROPOFOL INFUSION 10 MG/ML OPTIME
INTRAVENOUS | Status: DC | PRN
  Administered 2014-01-11: 200 mL via INTRAVENOUS

## 2014-01-11 SURGICAL SUPPLY — 45 items
ADAPTER CATH URET PLST 4-6FR (CATHETERS) IMPLANT
ADPR CATH URET STRL DISP 4-6FR (CATHETERS)
BAG DRAIN URO-CYSTO SKYTR STRL (DRAIN) ×3 IMPLANT
BAG DRN ANRFLXCHMBR STRAP LEK (BAG)
BAG DRN UROCATH (DRAIN) ×2
BAG URINE DRAINAGE (UROLOGICAL SUPPLIES) ×1 IMPLANT
BAG URINE LEG 19OZ MD ST LTX (BAG) IMPLANT
CANISTER SUCT LVC 12 LTR MEDI- (MISCELLANEOUS) ×1 IMPLANT
CATH FOLEY 2WAY SLVR  5CC 16FR (CATHETERS) ×1
CATH FOLEY 2WAY SLVR  5CC 20FR (CATHETERS)
CATH FOLEY 2WAY SLVR  5CC 22FR (CATHETERS)
CATH FOLEY 2WAY SLVR 5CC 16FR (CATHETERS) IMPLANT
CATH FOLEY 2WAY SLVR 5CC 20FR (CATHETERS) IMPLANT
CATH FOLEY 2WAY SLVR 5CC 22FR (CATHETERS) IMPLANT
CATH INTERMIT  6FR 70CM (CATHETERS) ×1 IMPLANT
CATH URET 5FR 28IN CONE TIP (BALLOONS)
CATH URET 5FR 28IN OPEN ENDED (CATHETERS) IMPLANT
CATH URET 5FR 70CM CONE TIP (BALLOONS) IMPLANT
CLOTH BEACON ORANGE TIMEOUT ST (SAFETY) ×3 IMPLANT
DRAPE CAMERA CLOSED 9X96 (DRAPES) ×3 IMPLANT
DRESSING TELFA 8X3 (GAUZE/BANDAGES/DRESSINGS) IMPLANT
ELECT BUTTON BIOP 24F 90D PLAS (MISCELLANEOUS) IMPLANT
ELECT LOOP HF 26F 30D .35MM (CUTTING LOOP) IMPLANT
ELECT LOOP MED HF 24F 12D CBL (CLIP) ×1 IMPLANT
ELECT REM PT RETURN 9FT ADLT (ELECTROSURGICAL) ×3
ELECTRODE REM PT RTRN 9FT ADLT (ELECTROSURGICAL) ×2 IMPLANT
EVACUATOR MICROVAS BLADDER (UROLOGICAL SUPPLIES) IMPLANT
GLOVE BIO SURGEON STRL SZ7.5 (GLOVE) ×4 IMPLANT
GLOVE BIOGEL M 6.5 STRL (GLOVE) ×1 IMPLANT
GLOVE BIOGEL PI IND STRL 6.5 (GLOVE) IMPLANT
GLOVE BIOGEL PI INDICATOR 6.5 (GLOVE) ×1
GOWN PREVENTION PLUS LG XLONG (DISPOSABLE) ×2 IMPLANT
GOWN STRL REIN XL XLG (GOWN DISPOSABLE) ×2 IMPLANT
GOWN STRL REUS W/TWL XL LVL3 (GOWN DISPOSABLE) ×2 IMPLANT
GUIDEWIRE 0.038 PTFE COATED (WIRE) IMPLANT
GUIDEWIRE ANG ZIPWIRE 038X150 (WIRE) IMPLANT
GUIDEWIRE STR DUAL SENSOR (WIRE) ×3 IMPLANT
HOLDER FOLEY CATH W/STRAP (MISCELLANEOUS) IMPLANT
IV NS IRRIG 3000ML ARTHROMATIC (IV SOLUTION) ×2 IMPLANT
LOOP CUTTING 24FR OLYMPUS (CUTTING LOOP) IMPLANT
PACK CYSTO (CUSTOM PROCEDURE TRAY) ×3 IMPLANT
PLUG CATH AND CAP STER (CATHETERS) IMPLANT
SET ASPIRATION TUBING (TUBING) IMPLANT
STENT URET 6FRX24 CONTOUR (STENTS) ×1 IMPLANT
WATER STERILE IRR 500ML POUR (IV SOLUTION) ×1 IMPLANT

## 2014-01-11 NOTE — Op Note (Signed)
Preoperative diagnosis: Bladder neoplasm Postoperative diagnosis: Bladder neoplasm  Procedure:  Exam under anesthesia  TURBT less than 2 cm Left retrograde pyelogram Left ureteral stent placement Mitomycin-C installation in PACU  Surgeon: Junious Silk  Anesthesia: [place]  Type of anesthesia: Gen.  Findings: On exam under anesthesia there was some hypermobility of the bladder neck and urethra. The tissues were soft and mobile. There were no palpable masses. No suprapubic or abdominal masses palpated on bimanual exam. Bladder and urethra palpably normal.  Cystoscopy revealed a papillary bladder tumor immediately adjacent to the left ureteral orifice and running along the intramural ureter. Resection of the tumor required resection of the intramural ureter creating a new ureteral orifice. The old ureteral orifice was resected. Hemostasis was excellent at low-pressure.  Left retrograde pyelogram revealed a single ureter single collecting system unit. The proximal ureter and collecting system was normal without filling defect dilation or stricture.  Description of procedure: After consent was obtained patient brought to the operating room. After adequate anesthesia she was placed in lithotomy position and prepped and draped in the usual sterile fashion. An exam under anesthesia was performed. The cystoscope was passed per urethra and the bladder carefully examined with a 12 and 70 lens. The tumor was too large for a cold cup and I was worried about the cold cup and fulguration damaging the ureteral orifice therefore the loop was used. The tumor was resected leaving the native ureteral orifice. Still tumor remained right up to the edge of the ureteral orifice. Therefore it was resected. Indigo was given and blue efflux was seen from the new ureteral orifice which would've been on the lateral edge of the tumor.   It was cannulated with a sensor wire and a 6 [place] open-ended catheter. The sensor  wire was removed with the 6 [place] open-ended catheter in the region of the left proximal ureter. Retrograde injection of contrast was performed confirming proper placement in the lumen of the collecting system. The wire was then advanced and coiled in the upper pole collecting system. The 6 [place] open-ended catheter was removed. The wire was backloaded on the cystoscope and a 6 x 24 cm stent was advanced. The wire was removed with a good coil seen in the upper pole collecting system and a good coil in the bladder.  The loop was repassed and fulguration carefully done except I did not fulgurate the new ureteral orifice and allowed to use slightly to aid in healing. Otherwise hemostasis was excellent at low-pressure. The scope was removed and a 58 French Foley placed and left gravity drainage.   Complications: None  Blood loss: Minimal  Drains: a 16 French Foley, 6 x 24 cm left ureteral stent   Specimens: Left bladder tumor to pathology   Disposition: Patient stable to PACU

## 2014-01-11 NOTE — Anesthesia Postprocedure Evaluation (Signed)
Anesthesia Post Note  Patient: [name]  Procedure(s) Performed: Procedure(s) (LRB): TRANSURETHRAL RESECTION OF BLADDER TUMOR (TURBT) (N/A) CYSTOSCOPY WITH RETROGRADE PYELOGRAM (Left) CYSTOSCOPY WITH STENT PLACEMENT (Left)  Anesthesia type: General  Patient location: PACU  Post pain: Pain level controlled  Post assessment: Post-op Vital signs reviewed  Last Vitals: BP 131/57  Pulse 54  Temp(Src) 36.5 C (Oral)  Resp 18  Ht 5\' 4"  (1.626 m)  Wt 215 lb 8 oz (97.75 kg)  BMI 36.97 kg/m2  SpO2 94%  Post vital signs: Reviewed  Level of consciousness: sedated  Complications: No apparent anesthesia complications

## 2014-01-11 NOTE — Interval H&P Note (Signed)
History and Physical Interval Note:  01/11/2014 9:55 AM  [name]  has presented today for surgery, with the diagnosis of Bladder Neoplasm  The various methods of treatment have been discussed with the patient and family. After consideration of risks, benefits and other options for treatment, the patient has consented to  Procedure(s): TRANSURETHRAL RESECTION OF BLADDER TUMOR (TURBT) (N/A) CYSTOSCOPY WITH STENT PLACEMENT, BLADDER BIOPSY  (N/A) as a surgical intervention .  The patient's history has been reviewed, patient examined, no change in status, stable for surgery.  I have reviewed the patient's chart and labs.  Questions were answered to the patient's satisfaction.  We discussed side effects of the proposed treatment, the likelihood of the patient achieving the goals of the procedure, and any potential problems that might occur during the procedure or recuperation. All questions answered. Patient elects to proceed.     Festus Aloe

## 2014-01-11 NOTE — Interval H&P Note (Signed)
History and Physical Interval Note:  01/11/2014 9:56 AM  We also discussed possible need for foley, possible ureteral stent and nature/r/b/a to Mitomycin-c in pacu.    [name]

## 2014-01-11 NOTE — Transfer of Care (Signed)
Immediate Anesthesia Transfer of Care Note  Patient: [name]  Procedure(s) Performed: Procedure(s): TRANSURETHRAL RESECTION OF BLADDER TUMOR (TURBT) (N/A) CYSTOSCOPY WITH RETROGRADE PYELOGRAM (Left) CYSTOSCOPY WITH STENT PLACEMENT (Left)  Patient Location: PACU  Anesthesia Type:General  Level of Consciousness: awake, alert , oriented and patient cooperative  Airway & Oxygen Therapy: Patient Spontanous Breathing and Patient connected to nasal cannula oxygen  Post-op Assessment: Report given to PACU RN and Post -op Vital signs reviewed and stable  Post vital signs: Reviewed and stable  Complications: No apparent anesthesia complications

## 2014-01-11 NOTE — Anesthesia Preprocedure Evaluation (Addendum)
Anesthesia Evaluation  Patient identified by MRN, date of birth, ID band Patient awake    Reviewed: Allergy & Precautions, H&P , NPO status , Patient's Chart, lab work & pertinent test results  History of Anesthesia Complications (+) PONV and history of anesthetic complications  Airway Mallampati: II TM Distance: >3 FB Neck ROM: Full    Dental no notable dental hx. (+) Caps, Dental Advisory Given,    Pulmonary COPDformer smoker,  breath sounds clear to auscultation  Pulmonary exam normal       Cardiovascular hypertension, Pt. on medications negative cardio ROS  Rhythm:Regular Rate:Normal     Neuro/Psych PSYCHIATRIC DISORDERS Anxiety Depression Bipolar Disorder negative neurological ROS     GI/Hepatic Neg liver ROS, GERD-  Medicated and Controlled,  Endo/Other  Hypothyroidism   Renal/GU negative Renal ROS     Musculoskeletal  (+) Fibromyalgia -, narcotic dependent  Abdominal (+) + obese,   Peds  Hematology negative hematology ROS (+) anemia ,   Anesthesia Other Findings   Reproductive/Obstetrics negative OB ROS                         Anesthesia Physical  Anesthesia Plan  ASA: II  Anesthesia Plan: General   Post-op Pain Management:    Induction: Intravenous  Airway Management Planned: LMA  Additional Equipment:   Intra-op Plan:   Post-operative Plan: Extubation in OR  Informed Consent: I have reviewed the patients History and Physical, chart, labs and discussed the procedure including the risks, benefits and alternatives for the proposed anesthesia with the patient or authorized representative who has indicated his/her understanding and acceptance.   Dental advisory given  Plan Discussed with: CRNA  Anesthesia Plan Comments:         Anesthesia Quick Evaluation

## 2014-01-11 NOTE — Discharge Instructions (Signed)
Ureteral Stent Implantation, Care After Refer to this sheet in the next few weeks. These instructions provide you with information on caring for yourself after your procedure. Your health care provider may also give you more specific instructions. Your treatment has been planned according to current medical practices, but problems sometimes occur. Call your health care provider if you have any problems or questions after your procedure. WHAT TO EXPECT AFTER THE PROCEDURE You should be back to normal activity within 48 hours after the procedure. Nausea and vomiting may occur and are commonly the result of anesthesia. It is common to experience sharp pain in the back or lower abdomen and penis with voiding. This is caused by movement of the ends of the stent with the act of urinating.It usually goes away within minutes after you have stopped urinating. HOME CARE INSTRUCTIONS Make sure to drink plenty of fluids. You may have small amounts of bleeding, causing your urine to be red. This is normal. Certain movements may trigger pain or a feeling that you need to urinate. You may be given medicines to prevent infection or bladder spasms. Be sure to take all medicines as directed. Only take over-the-counter or prescription medicines for pain, discomfort, or fever as directed by your health care provider. Do not take aspirin, as this can make bleeding worse. Your stent will be left in until the blockage is resolved. This may take 2 weeks or longer, depending on the reason for stent implantation. You may have an X-ray exam to make sure your ureter is open and that the stent has not moved out of position (migrated). The stent can be removed by your health care provider in the office. Medicines may be given for comfort while the stent is being removed. Be sure to keep all follow-up appointments so your health care provider can check that you are healing properly. SEEK MEDICAL CARE IF:  You experience increasing  pain.  Your pain medicine is not working. SEEK IMMEDIATE MEDICAL CARE IF:  Your urine is dark red or has blood clots.  You are leaking urine (incontinent).  You have a fever, chills, feeling sick to your stomach (nausea), or vomiting.  Your pain is not relieved by pain medicine.  The end of the stent comes out of the urethra.  You are unable to urinate.   Cystoscopy, Care After Refer to this sheet in the next few weeks. These instructions provide you with information on caring for yourself after your procedure. Your caregiver may also give you more specific instructions. Your treatment has been planned according to current medical practices, but problems sometimes occur. Call your caregiver if you have any problems or questions after your procedure. HOME CARE INSTRUCTIONS  Things you can do to ease any discomfort after your procedure include:  Drinking enough water and fluids to keep your urine clear or pale yellow.  Taking a warm bath to relieve any burning feelings. SEEK IMMEDIATE MEDICAL CARE IF:   You have an increase in blood in your urine.  You notice blood clots in your urine.  You have difficulty passing urine.  You have the chills.  You have abdominal pain.  You have a fever or persistent symptoms for more than 2-3 days.  You have a fever and your symptoms suddenly get worse. MAKE SURE YOU:   Understand these instructions.  Will watch your condition.  Will get help right away if you are not doing well or get worse. Document Released: 10/12/2004 Document Revised: 11/25/2012 Document  Reviewed: 09/16/2011 [employer] Patient Information 2015 [place], [place]. This information is not intended to replace advice given to you by your health care provider. Make sure you discuss any questions you have with your health care provider.   Transurethral Resection, Bladder Tumor A cancerous growth (tumor) can develop on the inside wall of the bladder. The bladder is the  organ that holds urine. One way to remove the tumor is a procedure called a transurethral resection. The tumor is removed (resected) through the tube that carries urine from the bladder out of the body (urethra). No cuts (incisions) are made in the skin. Instead, the procedure is done through a thin telescope, called a resectoscope. Attached to it is a light and usually a tiny camera. The resectoscope is put into the urethra. In men, the urethra opens at the end of the penis. In women, it opens just above the vagina.  A transurethral resection is usually used to remove tumors that have not gotten too big or too deep. These are called Stage 0, Stage 1 or Stage 2 bladder cancers. LET YOUR CAREGIVER KNOW ABOUT:  On the day of the procedure, your caregivers will need to know the last time you had anything to eat or drink. This includes water, gum, and candy. In advance, make sure they know about:   Any allergies.  All medications you are taking, including:  Herbs, eyedrops, over-the-counter medications and creams.  Blood thinners (anticoagulants), aspirin or other drugs that could affect blood clotting.  Use of steroids (by mouth or as creams).  Previous problems with anesthetics, including local anesthetics.  Possibility of pregnancy, if this applies.  Any history of blood clots.  Any history of bleeding or other blood problems.  Previous surgery.  Smoking history.  Any recent symptoms of colds or infections.  Other health problems. RISKS AND COMPLICATIONS This is usually a safe procedure. Every procedure has risks, though. For a transurethral resection, they include:  Infection. Antibiotic medication would need to be taken.  Bleeding.  Light bleeding may last for several days after the procedure.  If bleeding continues or is heavy, the bladder may need rinsing. Or, a new catheter might be put in for awhile.  Sometimes bed rest is needed.  Urination problems.  Pain and  burning can occur when urinating. This usually goes away in a few days.  Scarring from the procedure can block the flow of urine.  Bladder damage.  It can be punctured or torn during removal of the tumor. If this happens, a catheter might be needed for longer. Antibiotics would be taken while the bladder heals.  Urine can leak through the hole or tear into the abdomen. If this happens, surgery may be needed to repair the bladder. BEFORE THE PROCEDURE   A medical evaluation will be done. This may include:  A physical examination.  Urine test. This is to make sure you do not have a urinary tract infection.  Blood tests.  A test that checks the heart's rhythm (electrocardiogram).  Talking with an anesthesiologist. This is the person who will be in charge of the medication (anesthesia) to keep you from feeling pain during the transurethral resection. You might be asleep during the procedure (general anesthesia) or numb from the waist down, but awake during the procedure (spinal anesthesia). Ask your surgeon what to expect.  The person who is having a transurethral resection needs to give what is called informed consent. This requires signing a legal paper that gives permission for  the procedure. To give informed consent:  You must understand how the procedure is done and why.  You must be told all the risks and benefits of the procedure.  You must sign the consent. Sometimes a legal guardian can do this.  Signing should be witnessed by a healthcare professional.  The day before the surgery, eat only a light dinner. Then, do not eat or drink anything for at least 8 hours before the surgery. Ask your caregiver if it is OK to take any needed medicines with a sip of water.  Arrive at least an hour before the surgery or whenever your surgeon recommends. This will give you time to check in and fill out any needed paperwork. PROCEDURE  The preparation:  You will change into a hospital  gown.  A needle will be inserted in your arm. This is an intravenous access tube (IV). Medication will be able to flow directly into your body through this needle.  Small monitors will be put on your body. They are used to check your heart, blood pressure, and oxygen level.  You might be given medication that will help you relax (sedative).  You will be given a general anesthetic or spinal anesthesia.  The procedure:  Once you are asleep or numb from the waist down, your legs will be placed in stirrups.  The resectoscope will be passed through the urethra into the bladder.  Fluid will be passed through the resectoscope. This will fill the bladder with water.  The surgeon will examine the bladder through the scope. If the scope has a camera, it can take pictures from inside the bladder. They can be projected onto a TV screen.  The surgeon will use various tools to remove the tumor in small pieces. Sometimes a laser (a beam of light energy) is used. Other tools may use electric current.  A tube (catheter) will often be placed so that urine can drain into a bag outside the body. This process helps stop bleeding. This tube keeps blood clots from blocking the urethra.  The procedure usually takes 30 to 45 minutes. AFTER THE PROCEDURE   You will stay in a recovery area until the anesthesia has worn off. Your blood pressure and pulse will be checked every so often. Then you will be taken to a hospital room.  You may continue to get fluids through the IV for awhile.  Some pain is normal. The catheter might be uncomfortable. Pain is usually not severe. If it is, ask for pain medicine.  Your urine may look bloody after a transurethral resection. This is normal.  If bleeding is heavy, a hospital caregiver may rinse out the bladder (irrigation) through the catheter.  Once the urine is clear, the catheter will be taken out.  You will need to stay in the hospital until you can urinate on  your own.  Most people stay in the hospital for up to 4 days. PROGNOSIS   Transurethral resection is considered the best way to treat bladder tumors that are not too far along. For most people, the treatment is successful. Sometimes, though, more treatment is needed.  Bladder cancers can come back even after a successful procedure. Because of this, be sure to have a checkup with your caregiver every 3 to 6 months. If everything is OK for 3 years, you can reduce the checkups to once a year. Document Released: 01/19/2009 Document Revised: 06/17/2011 Document Reviewed: 05/04/2013 [facility] Patient Information 2015 [place], [place]. This information is not  intended to replace advice given to you by your health care provider. Make sure you discuss any questions you have with your health care provider.   Post Anesthesia Home Care Instructions  Activity: Get plenty of rest for the remainder of the day. A responsible adult should stay with you for 24 hours following the procedure.  For the next 24 hours, DO NOT: -Drive a car -Paediatric nurse -Drink alcoholic beverages -Take any medication unless instructed by your physician -Make any legal decisions or sign important papers.  Meals: Start with liquid foods such as gelatin or soup. Progress to regular foods as tolerated. Avoid greasy, spicy, heavy foods. If nausea and/or vomiting occur, drink only clear liquids until the nausea and/or vomiting subsides. Call your physician if vomiting continues.  Special Instructions/Symptoms: Your throat may feel dry or sore from the anesthesia or the breathing tube placed in your throat during surgery. If this causes discomfort, gargle with warm salt water. The discomfort should disappear within 24 hours.

## 2014-01-11 NOTE — Anesthesia Procedure Notes (Signed)
Procedure Name: LMA Insertion Date/Time: 01/11/2014 10:06 AM Performed by: Wanita Chamberlain Pre-anesthesia Checklist: Patient identified, Timeout performed, Emergency Drugs available, Suction available and Patient being monitored Oxygen Delivery Method: Circle system utilized Preoxygenation: Pre-oxygenation with 100% oxygen Intubation Type: IV induction Ventilation: Mask ventilation without difficulty LMA: LMA inserted LMA Size: 4.0 Airway Equipment and Method: Bite block Placement Confirmation: positive ETCO2 Tube secured with: Tape Dental Injury: Teeth and Oropharynx as per pre-operative assessment  Comments: Small mouth w/ limited opening

## 2014-01-12 ENCOUNTER — Encounter (HOSPITAL_BASED_OUTPATIENT_CLINIC_OR_DEPARTMENT_OTHER): Payer: Self-pay | Admitting: Urology

## 2014-01-14 ENCOUNTER — Ambulatory Visit (INDEPENDENT_AMBULATORY_CARE_PROVIDER_SITE_OTHER): Payer: 59 | Admitting: Physician Assistant

## 2014-01-14 ENCOUNTER — Encounter: Payer: Self-pay | Admitting: Physician Assistant

## 2014-01-14 VITALS — BP 120/70 | HR 72 | Temp 97.7°F | Resp 16 | Wt 215.0 lb

## 2014-01-14 DIAGNOSIS — F3181 Bipolar II disorder: Secondary | ICD-10-CM

## 2014-01-14 DIAGNOSIS — E785 Hyperlipidemia, unspecified: Secondary | ICD-10-CM

## 2014-01-14 DIAGNOSIS — M797 Fibromyalgia: Secondary | ICD-10-CM

## 2014-01-14 DIAGNOSIS — R7303 Prediabetes: Secondary | ICD-10-CM

## 2014-01-14 DIAGNOSIS — N3001 Acute cystitis with hematuria: Secondary | ICD-10-CM

## 2014-01-14 DIAGNOSIS — R1032 Left lower quadrant pain: Secondary | ICD-10-CM

## 2014-01-14 DIAGNOSIS — Z23 Encounter for immunization: Secondary | ICD-10-CM

## 2014-01-14 DIAGNOSIS — R7309 Other abnormal glucose: Secondary | ICD-10-CM

## 2014-01-14 DIAGNOSIS — G2581 Restless legs syndrome: Secondary | ICD-10-CM

## 2014-01-14 DIAGNOSIS — K5909 Other constipation: Secondary | ICD-10-CM

## 2014-01-14 DIAGNOSIS — I10 Essential (primary) hypertension: Secondary | ICD-10-CM

## 2014-01-14 DIAGNOSIS — Z79899 Other long term (current) drug therapy: Secondary | ICD-10-CM

## 2014-01-14 DIAGNOSIS — E079 Disorder of thyroid, unspecified: Secondary | ICD-10-CM

## 2014-01-14 LAB — CBC WITH DIFFERENTIAL/PLATELET
Basophils Absolute: 0 10*3/uL (ref 0.0–0.1)
Basophils Relative: 0 % (ref 0–1)
Eosinophils Absolute: 0.1 10*3/uL (ref 0.0–0.7)
Eosinophils Relative: 1 % (ref 0–5)
HCT: 42.1 % (ref 36.0–46.0)
Hemoglobin: 14 g/dL (ref 12.0–15.0)
Lymphocytes Relative: 41 % (ref 12–46)
Lymphs Abs: 5 10*3/uL — ABNORMAL HIGH (ref 0.7–4.0)
MCH: 29.6 pg (ref 26.0–34.0)
MCHC: 33.3 g/dL (ref 30.0–36.0)
MCV: 89 fL (ref 78.0–100.0)
Monocytes Absolute: 0.7 10*3/uL (ref 0.1–1.0)
Monocytes Relative: 6 % (ref 3–12)
Neutro Abs: 6.3 10*3/uL (ref 1.7–7.7)
Neutrophils Relative %: 52 % (ref 43–77)
Platelets: 328 10*3/uL (ref 150–400)
RBC: 4.73 MIL/uL (ref 3.87–5.11)
RDW: 13.6 % (ref 11.5–15.5)
WBC: 12.2 10*3/uL — ABNORMAL HIGH (ref 4.0–10.5)

## 2014-01-14 LAB — BASIC METABOLIC PANEL WITH GFR
BUN: 23 mg/dL (ref 6–23)
CO2: 29 mEq/L (ref 19–32)
Calcium: 9.6 mg/dL (ref 8.4–10.5)
Chloride: 103 mEq/L (ref 96–112)
Creat: 0.74 mg/dL (ref 0.50–1.10)
GFR, Est African American: >89 mL/min
GFR, Est Non African American: >89 mL/min
Glucose, Bld: 79 mg/dL (ref 70–99)
Potassium: 4.5 mEq/L (ref 3.5–5.3)
Sodium: 140 mEq/L (ref 135–145)

## 2014-01-14 LAB — LIPID PANEL
Cholesterol: 174 mg/dL (ref 0–200)
HDL: 49 mg/dL (ref >39)
LDL Cholesterol: 94 mg/dL (ref 0–99)
Total CHOL/HDL Ratio: 3.6 Ratio
Triglycerides: 157 mg/dL — ABNORMAL HIGH (ref <150)
VLDL: 31 mg/dL (ref 0–40)

## 2014-01-14 LAB — HEPATIC FUNCTION PANEL
ALT: 26 U/L (ref 0–35)
AST: 19 U/L (ref 0–37)
Albumin: 4.1 g/dL (ref 3.5–5.2)
Alkaline Phosphatase: 67 U/L (ref 39–117)
Bilirubin, Direct: 0.1 mg/dL (ref 0.0–0.3)
Indirect Bilirubin: 0.3 mg/dL (ref 0.2–1.2)
Total Bilirubin: 0.4 mg/dL (ref 0.2–1.2)
Total Protein: 6.8 g/dL (ref 6.0–8.3)

## 2014-01-14 LAB — URINALYSIS, ROUTINE W REFLEX MICROSCOPIC
Bilirubin Urine: NEGATIVE
Glucose, UA: NEGATIVE mg/dL
Ketones, ur: NEGATIVE mg/dL
Nitrite: NEGATIVE
Protein, ur: 30 mg/dL — AB
Specific Gravity, Urine: 1.021 (ref 1.005–1.030)
Urobilinogen, UA: 0.2 mg/dL (ref 0.0–1.0)
pH: 7 (ref 5.0–8.0)

## 2014-01-14 LAB — HEMOGLOBIN A1C
Hgb A1c MFr Bld: 5.7 % — ABNORMAL HIGH (ref <5.7)
Mean Plasma Glucose: 117 mg/dL — ABNORMAL HIGH (ref <117)

## 2014-01-14 LAB — TSH: TSH: 8.935 u[IU]/mL — ABNORMAL HIGH (ref 0.350–4.500)

## 2014-01-14 LAB — MAGNESIUM: Magnesium: 1.9 mg/dL (ref 1.5–2.5)

## 2014-01-14 MED ORDER — AMPHETAMINE-DEXTROAMPHETAMINE 20 MG PO TABS: 20.0000 mg | ORAL_TABLET | Freq: Two times a day (BID) | ORAL | Status: DC

## 2014-01-14 MED ORDER — CIPROFLOXACIN HCL 500 MG PO TABS: 500.0000 mg | ORAL_TABLET | Freq: Two times a day (BID) | ORAL | Status: AC

## 2014-01-14 MED ORDER — VORTIOXETINE HBR 10 MG PO TABS: ORAL_TABLET | ORAL | Status: DC

## 2014-01-14 MED ORDER — PREGABALIN 200 MG PO CAPS: 200.0000 mg | ORAL_CAPSULE | Freq: Three times a day (TID) | ORAL | Status: DC

## 2014-01-14 MED ORDER — LEVOTHYROXINE SODIUM 75 MCG PO TABS: 75.0000 ug | ORAL_TABLET | Freq: Every day | ORAL | Status: DC

## 2014-01-14 MED ORDER — CLONAZEPAM 2 MG PO TABS: 2.0000 mg | ORAL_TABLET | Freq: Two times a day (BID) | ORAL | Status: DC

## 2014-01-14 NOTE — Progress Notes (Signed)
Assessment and Plan:  Hypertension: Continue medication, monitor blood pressure at home. Continue DASH diet. Cholesterol: Continue diet and exercise. Check cholesterol.  Pre-diabetes-Continue diet and exercise. Check A1C Vitamin D Def- check level and continue medications.  LLQ rebound tenderness s/p surgery for bladder tumor resection that appears cancerous per path, did not discuss with patient, has appointment at urologist Tuesday-  vitals stable- will check CBC, urine, CMET if worsening pain/nausea go to ER- will emperically treat with cipro for intestine/UTI. Can also be from constipation from pain pills will give movantik.  Bipolar/OCD/Depression-  Continue the klonopin, and stop the lexapro, switch to brintellix, continue adderall. Suggest seeing psychiatrist for evaluation. stress management techniques discussed, increase water, good sleep hygiene discussed, increase exercise, and increase veggies.  Referral to OBGYN- discussed BRCA testing, she wants to discuss this and possibly hysterectomy with OB/GYN- needs MGM too Influenza shot this visit  OVER 40 minutes of exam, counseling, chart review, referral performed Continue diet and meds as discussed. Further disposition pending results of labs.  HPI 54 y.o. female  With 30 pack year smoking (Q 2011) presents for 3 month follow up with hypertension, hyperlipidemia, prediabetes and vitamin D, s/p bladder tumor resection with AB pain. Her blood pressure has been controlled at home, today their BP is BP: 120/70 mmHg She does not workout. She denies chest pain, shortness of breath, dizziness.  She is not on cholesterol medication and denies myalgias. Her cholesterol is not at goal. The cholesterol last visit was:   Lab Results  Component Value Date   CHOL 194 09/30/2013   HDL 57 09/30/2013   LDLCALC 117* 09/30/2013   TRIG 98 09/30/2013   CHOLHDL 3.4 09/30/2013   She has been working on diet and exercise for prediabetes, and denies paresthesia  of the feet, polydipsia and polyuria. Last A1C in the office was:  Lab Results  Component Value Date   HGBA1C 5.7* 09/30/2013   Patient is on Vitamin D supplement.   Lab Results  Component Value Date   VD25OH 64 09/30/2013     She saw a urologist and had bladder surgery with Dr. Junious Silk on 01/11/2014, there was a tumor that did show carcinoma, I will let her urolgist discuss this her at their visit. She follow sup on Tuesday with him, she is having some lower AB cramping, dysuria, pain with riding in the car over here.Denies fever, chills.    She has a very strong history of cancer, 1 older sister with ovarian cancer, twin with hysterectomy when she was 84 due to ovarian cyst and 3 years ago with cervical cancer, and another older sister with ovarian cancer and two aunts with ovarian cancer. Last [facility] 06/2012 and she has FBD. She needs MGM and I suggest 3D. She would like a referral to OB/GYN and we did discuss BRCA testing as well with such a big family history of breast and ovarian cancer.   She has bipolar/depression/OCD tendencies- she is on lexapro and klonopin but states that this is not helping her, she states the adderall does help her. Her twin describes an episode of her tearing up the kitchen, and have a nervous break down. She is on klonopin and lyrica for fibromyalgia and anxiety. Denies SI/HI.   She is on thyroid medication. Her medication was changed last visit, she is on 1 pill daily. Patient denies nervousness, palpitations and weight changes.  Lab Results  Component Value Date   TSH 3.806 12/07/2013  .   BMI is  Body mass index is 36.89 kg/(m^2)., she is struggling with weight loss due to stress, depression. Wt Readings from Last 3 Encounters:  01/14/14 215 lb (97.523 kg)  01/11/14 215 lb 8 oz (97.75 kg)  01/11/14 215 lb 8 oz (97.75 kg)     Current Medications:  Current Outpatient Prescriptions on File Prior to Visit  Medication Sig Dispense Refill  .  amphetamine-dextroamphetamine (ADDERALL) 20 MG tablet Take 1 tablet (20 mg total) by mouth 2 (two) times daily.  60 tablet  0  . cholecalciferol (VITAMIN D) 1000 UNITS tablet Take 7,000 Units by mouth at bedtime.       . clonazePAM (KLONOPIN) 2 MG tablet Take 1 tablet (2 mg total) by mouth 2 (two) times daily.  60 tablet  1  . escitalopram (LEXAPRO) 20 MG tablet Take 20 mg by mouth daily.      . ferrous sulfate 325 (65 FE) MG tablet Take 325 mg by mouth at bedtime.      Marland Kitchen levothyroxine (SYNTHROID, LEVOTHROID) 75 MCG tablet Take 75 mcg by mouth daily before breakfast.      . Magnesium 250 MG TABS Take 250 mg by mouth at bedtime.      . methocarbamol (ROBAXIN-750) 750 MG tablet Take 1 tablet (750 mg total) by mouth every 8 (eight) hours as needed for muscle spasms.  40 tablet  0  . oxybutynin (DITROPAN) 5 MG tablet Take 1 tablet (5 mg total) by mouth every 8 (eight) hours as needed for bladder spasms.  30 tablet  0  . oxyCODONE-acetaminophen (PERCOCET/ROXICET) 5-325 MG per tablet Take 1-2 tablets by mouth every 6 (six) hours as needed for severe pain.  40 tablet  0  . pregabalin (LYRICA) 200 MG capsule Take 1 capsule (200 mg total) by mouth 3 (three) times daily.  90 capsule  3   Current Facility-Administered Medications on File Prior to Visit  Medication Dose Route Frequency Provider Last Rate Last Dose  . mitoMYcin (MUTAMYCIN) chemo injection 40 mg  40 mg Bladder Instillation Once Festus Aloe, MD       Medical History:  Past Medical History  Diagnosis Date  . Fibromyalgia   . PONV (postoperative nausea and vomiting)     used a scop patch last surg-did well  . Hyperlipidemia   . COPD (chronic obstructive pulmonary disease)   . Prediabetes   . Restless leg syndrome   . Hypertension     normal now, only took medications for 1 month- thinks it was from pain.  Marland Kitchen Hypothyroidism   . Hemorrhoid   . Anxiety   . Depression   . ADD (attention deficit disorder)   . Bladder neoplasm   .  History of melanoma excision     leg  . Iron deficiency anemia    Allergies:  Allergies  Allergen Reactions  . Sulfa Antibiotics Hives and Itching     Review of Systems: [X]  = complains of  [ ]  = denies  General: Fatigue [x]  Fever [ ]  Chills [ ]  Weakness [ ]   Insomnia [ ]  Eyes: Redness [ ]  Blurred vision [ ]  Diplopia [ ]   ENT: Congestion [ ]  Sinus Pain [ ]  Post Nasal Drip [ ]  Sore Throat [ ]  Earache [ ]   Cardiac: Chest pain/pressure [ ]  SOB [ ]  Orthopnea [ ]   Palpitations [ ]   Paroxysmal nocturnal dyspnea[ ]  Claudication [ ]  Edema [ ]   Pulmonary: Cough [ ]  Wheezing[ ]   SOB [ ]   Snoring [ ]   GI: Nausea [ ]  Vomiting[ ]  Dysphagia[ ]  Heartburn[ ]  Abdominal pain [x ] Constipation [ x]; Diarrhea [ ] ; BRBPR [ ]  Melena[ ]  GU: Hematuria[x ] Dysuria [x ] Nocturia[ ]  Urgency [ ]   Hesitancy [ ]  Discharge [ ]  Neuro: Headaches[ ]  Vertigo[ ]  Paresthesias[ ]  Spasm [ ]  Speech changes [ ]  Incoordination [ ]   Ortho: Arthritis [x]  Joint pain [x ] Muscle pain [x ] Joint swelling [ ]  Back Pain [x ] Skin:  Rash [ ]   Pruritis [ ]  Change in skin lesion [ ]   Psych: Depression[ x] Anxiety[ ]  Confusion [ ]  Memory loss [ ]   Heme/Lypmh: Bleeding [ ]  Bruising [ ]  Enlarged lymph nodes [ ]   Endocrine: Visual blurring [ ]  Paresthesia [ ]  Polyuria [ ]  Polydypsea [ ]    Heat/cold intolerance [ ]  Hypoglycemia [ ]   Family history- Review and unchanged Social history- Review and unchanged Physical Exam: BP 120/70  Pulse 72  Temp(Src) 97.7 F (36.5 C)  Resp 16  Wt 215 lb (97.523 kg) Wt Readings from Last 3 Encounters:  01/14/14 215 lb (97.523 kg)  01/11/14 215 lb 8 oz (97.75 kg)  01/11/14 215 lb 8 oz (97.75 kg)   General Appearance: Well nourished, in no apparent distress. Eyes: PERRLA, EOMs, conjunctiva no swelling or erythema Sinuses: No Frontal/maxillary tenderness ENT/Mouth: Ext aud canals clear, TMs without erythema, bulging. No erythema, swelling, or exudate on post pharynx.  Tonsils not swollen or  erythematous. Hearing normal.  Neck: Supple, thyroid normal.  Respiratory: Respiratory effort normal, BS equal bilaterally without rales, rhonchi, wheezing or stridor.  Cardio: RRR with no MRGs. Brisk peripheral pulses without edema.  Abdomen: Soft, decreased BS, + tenderness LLQ with mild rebound/gaurding, without hernias, masses. Lymphatics: Non tender without lymphadenopathy.  Musculoskeletal: Full ROM, 5/5 strength, normal antalgic with cane.   Skin: Warm, dry without rashes, lesions, ecchymosis.  Neuro: Cranial nerves intact. Normal muscle tone, no cerebellar symptoms.  Psych: Awake and oriented X 3, normal affect, Insight and Judgment appropriate.    Vicie Mutters 8:37 AM

## 2014-01-15 LAB — URINALYSIS, MICROSCOPIC ONLY
Casts: NONE SEEN
Crystals: NONE SEEN
RBC / HPF: >50 RBC/hpf — AB (ref <3)

## 2014-01-15 LAB — URINE CULTURE
Colony Count: NO GROWTH
Organism ID, Bacteria: NO GROWTH

## 2014-01-16 ENCOUNTER — Emergency Department (HOSPITAL_COMMUNITY)
Admission: EM | Admit: 2014-01-16 | Discharge: 2014-01-17 | Disposition: A | Discharge status: Home / Self Care | Payer: 59 | Attending: Emergency Medicine | Admitting: Emergency Medicine

## 2014-01-16 ENCOUNTER — Encounter (HOSPITAL_COMMUNITY): Payer: Self-pay | Admitting: Emergency Medicine

## 2014-01-16 ENCOUNTER — Emergency Department (HOSPITAL_COMMUNITY): Payer: 59

## 2014-01-16 DIAGNOSIS — J449 Chronic obstructive pulmonary disease, unspecified: Secondary | ICD-10-CM | POA: Diagnosis not present

## 2014-01-16 DIAGNOSIS — Z87441 Personal history of nephrotic syndrome: Secondary | ICD-10-CM | POA: Diagnosis not present

## 2014-01-16 DIAGNOSIS — Z79899 Other long term (current) drug therapy: Secondary | ICD-10-CM | POA: Insufficient documentation

## 2014-01-16 DIAGNOSIS — Z87891 Personal history of nicotine dependence: Secondary | ICD-10-CM | POA: Insufficient documentation

## 2014-01-16 DIAGNOSIS — F329 Major depressive disorder, single episode, unspecified: Secondary | ICD-10-CM | POA: Diagnosis not present

## 2014-01-16 DIAGNOSIS — E039 Hypothyroidism, unspecified: Secondary | ICD-10-CM | POA: Diagnosis not present

## 2014-01-16 DIAGNOSIS — M797 Fibromyalgia: Secondary | ICD-10-CM | POA: Insufficient documentation

## 2014-01-16 DIAGNOSIS — Z792 Long term (current) use of antibiotics: Secondary | ICD-10-CM | POA: Diagnosis not present

## 2014-01-16 DIAGNOSIS — I10 Essential (primary) hypertension: Secondary | ICD-10-CM | POA: Insufficient documentation

## 2014-01-16 DIAGNOSIS — R319 Hematuria, unspecified: Secondary | ICD-10-CM | POA: Diagnosis not present

## 2014-01-16 DIAGNOSIS — D509 Iron deficiency anemia, unspecified: Secondary | ICD-10-CM | POA: Insufficient documentation

## 2014-01-16 DIAGNOSIS — F419 Anxiety disorder, unspecified: Secondary | ICD-10-CM | POA: Diagnosis not present

## 2014-01-16 DIAGNOSIS — R1032 Left lower quadrant pain: Secondary | ICD-10-CM

## 2014-01-16 DIAGNOSIS — G2581 Restless legs syndrome: Secondary | ICD-10-CM | POA: Insufficient documentation

## 2014-01-16 LAB — URINALYSIS, ROUTINE W REFLEX MICROSCOPIC
Bilirubin Urine: NEGATIVE
Glucose, UA: NEGATIVE mg/dL
Ketones, ur: NEGATIVE mg/dL
Nitrite: NEGATIVE
Protein, ur: 100 mg/dL — AB
Specific Gravity, Urine: 1.016 (ref 1.005–1.030)
Urobilinogen, UA: 1 mg/dL (ref 0.0–1.0)
pH: 7 (ref 5.0–8.0)

## 2014-01-16 LAB — URINE MICROSCOPIC-ADD ON

## 2014-01-16 MED ORDER — CEPHALEXIN 500 MG PO CAPS
500.0000 mg | ORAL_CAPSULE | Freq: Once | ORAL | Status: AC
  Administered 2014-01-17: 500 mg via ORAL
  Filled 2014-01-16: qty 1

## 2014-01-16 MED ORDER — HYDROMORPHONE HCL 1 MG/ML IJ SOLN
1.0000 mg | Freq: Once | INTRAMUSCULAR | Status: AC
  Administered 2014-01-16: 1 mg via INTRAMUSCULAR
  Filled 2014-01-16: qty 1

## 2014-01-16 MED ORDER — CEPHALEXIN 500 MG PO CAPS: 500.0000 mg | ORAL_CAPSULE | Freq: Four times a day (QID) | ORAL | Status: DC

## 2014-01-16 NOTE — Discharge Instructions (Signed)
Your CT scan was reassuring - your urine sample has lots of blood in it and likely some infection.  Please call your doctor for a followup appointment within 24-48 hours. When you talk to your doctor please let them know that you were seen in the emergency department and have them acquire all of your records so that they can discuss the findings with you and formulate a treatment plan to fully care for your new and ongoing problems.

## 2014-01-16 NOTE — ED Provider Notes (Signed)
CSN: [id number]     Arrival date & time 01/16/14  2101 History   First MD Initiated Contact with Patient 01/16/14 2128     Chief Complaint  Patient presents with  . Abdominal Pain     (Consider location/radiation/quality/duration/timing/severity/associated sxs/prior Treatment) HPI Comments: The patient is a 53 year old female with a history of a bladder tumor that was resected on Tuesday, this tumor extended into the intramural distal ureter, a stent was placed in the ureter and a new ureteral orifice was created. Since that time the patient has been doing well until the last 24-48 hours when she has developed left lower quadrant pain and a significant dysuria. She denies fevers chills nausea or vomiting. She states that her urine is dark and bloody in appearance, she is only able to urinate small amounts and feels like she cannot completely empty her bladder.  Patient is a 53 y.o. female presenting with abdominal pain. The history is provided by the patient, the spouse and medical records.  Abdominal Pain   Past Medical History  Diagnosis Date  . Fibromyalgia   . PONV (postoperative nausea and vomiting)     used a scop patch last surg-did well  . Hyperlipidemia   . COPD (chronic obstructive pulmonary disease)   . Prediabetes   . Restless leg syndrome   . Hypertension     normal now, only took medications for 1 month- thinks it was from pain.  Marland Kitchen Hypothyroidism   . Hemorrhoid   . Anxiety   . Depression   . ADD (attention deficit disorder)   . Bladder neoplasm   . History of melanoma excision     leg  . Iron deficiency anemia    Past Surgical History  Procedure Laterality Date  . Ankle fusion Right 2011  . Knee arthroscopy with medial menisectomy Right 05/22/2012    Procedure: KNEE ARTHROSCOPY WITH MEDIAL MENISECTOMY and Medial Plica excision;  Surgeon: Alta Corning, MD;  Location: [facility];  Service: Orthopedics;  Laterality: Right;  . Anterior cervical  decomp/discectomy fusion N/A 09/01/2013    Procedure: Anterior cervical decompression fusion, cervical 5-6 with instrumentation and allograft;  Surgeon: Sinclair Ship, MD;  Location: [facility];  Service: Orthopedics;  Laterality: N/A;  Anterior cervical decompression fusion, cervical 5-6 with instrumentation and allograft  . Shoulder arthroscopy with subacromial decompression Left 11/10/2013    Procedure: SHOULDER ARTHROSCOPY WITH SUBACROMIAL DECOMPRESSION WITH ROTATOR CUFF REPAIR AND BICEPS TENODESIS;  Surgeon: Alta Corning, MD;  Location: [facility];  Service: Orthopedics;  Laterality: Left;  . Closed reduction and pinning left fifth  proximal phalangeal fx  10-09-2005  . D & c hysteroscopy with novasure endometrial ablation  07-16-2006  . Shoulder arthroscopy w/ subacromial decompression and distal clavicle excision Left 11-27-2011    W/  DEBRIDEMENT  . Cataract extraction w/ intraocular lens implant Bilateral   . Tonsillectomy  age 55  . Laparoscopic cholecystectomy  2005  . Tubal ligation  1980  . Transurethral resection of bladder tumor N/A 01/11/2014    Procedure: TRANSURETHRAL RESECTION OF BLADDER TUMOR (TURBT);  Surgeon: Festus Aloe, MD;  Location: [facility];  Service: Urology;  Laterality: N/A;  . Cystoscopy w/ retrogrades Left 01/11/2014    Procedure: CYSTOSCOPY WITH RETROGRADE PYELOGRAM;  Surgeon: Festus Aloe, MD;  Location: [facility];  Service: Urology;  Laterality: Left;  . Cystoscopy with stent placement Left 01/11/2014    Procedure: CYSTOSCOPY WITH STENT PLACEMENT;  Surgeon: Rodman Key  Junious Silk, MD;  Location: [facility];  Service: Urology;  Laterality: Left;   Family History  Problem Relation Age of Onset  . Cancer Father     Lung and stomach   . Heart disease Father   . Hypertension Father   . Stroke Father   . Heart failure Paternal Grandfather   . Cancer Paternal Grandfather     colon cancer  .  Mental retardation Son    History  Substance Use Topics  . Smoking status: Former Smoker -- 1.00 packs/day for 30 years    Types: Cigarettes    Quit date: 11/25/2011  . Smokeless tobacco: Never Used  . Alcohol Use: No   OB History   Grav Para Term Preterm Abortions TAB SAB Ect Mult Living                 Review of Systems  Gastrointestinal: Positive for abdominal pain.  All other systems reviewed and are negative.     Allergies  Sulfa antibiotics  Home Medications   Prior to Admission medications   Medication Sig Start Date End Date Taking? Authorizing Provider  amphetamine-dextroamphetamine (ADDERALL) 20 MG tablet Take 1 tablet (20 mg total) by mouth 2 (two) times daily. 01/14/14  Yes Vicie Mutters, PA-C  cholecalciferol (VITAMIN D) 1000 UNITS tablet Take 7,000 Units by mouth at bedtime.    Yes Historical Provider, MD  ciprofloxacin (CIPRO) 500 MG tablet Take 1 tablet (500 mg total) by mouth 2 (two) times daily. 01/14/14 01/28/14 Yes Vicie Mutters, PA-C  clonazePAM (KLONOPIN) 2 MG tablet Take 1 tablet (2 mg total) by mouth 2 (two) times daily. 01/14/14  Yes Vicie Mutters, PA-C  ferrous sulfate 325 (65 FE) MG tablet Take 325 mg by mouth at bedtime.   Yes Historical Provider, MD  levothyroxine (SYNTHROID, LEVOTHROID) 75 MCG tablet Take 1 tablet (75 mcg total) by mouth daily before breakfast. 01/14/14  Yes Vicie Mutters, PA-C  Magnesium 250 MG TABS Take 250 mg by mouth at bedtime.   Yes Historical Provider, MD  methocarbamol (ROBAXIN-750) 750 MG tablet Take 1 tablet (750 mg total) by mouth every 8 (eight) hours as needed for muscle spasms. 11/10/13  Yes Erlene Senters, PA-C  oxybutynin (DITROPAN) 5 MG tablet Take 1 tablet (5 mg total) by mouth every 8 (eight) hours as needed for bladder spasms. 01/11/14  Yes Festus Aloe, MD  oxyCODONE-acetaminophen (PERCOCET/ROXICET) 5-325 MG per tablet Take 1-2 tablets by mouth every 6 (six) hours as needed for severe pain. 11/10/13  Yes Erlene Senters, PA-C  pregabalin (LYRICA) 200 MG capsule Take 1 capsule (200 mg total) by mouth 3 (three) times daily. 01/14/14  Yes Vicie Mutters, PA-C  Vortioxetine HBr (BRINTELLIX) 10 MG TABS Once daily 01/14/14  Yes Vicie Mutters, PA-C  cephALEXin (KEFLEX) 500 MG capsule Take 1 capsule (500 mg total) by mouth 4 (four) times daily. 01/16/14   Johnna Acosta, MD   BP 134/57  Pulse 69  Temp(Src) 98.2 F (36.8 C) (Oral)  Resp 20  SpO2 97% Physical Exam  Nursing note and vitals reviewed. Constitutional: She appears well-developed and well-nourished. No distress.  HENT:  Head: Normocephalic and atraumatic.  Mouth/Throat: Oropharynx is clear and moist. No oropharyngeal exudate.  Eyes: Conjunctivae and EOM are normal. Pupils are equal, round, and reactive to light. Right eye exhibits no discharge. Left eye exhibits no discharge. No scleral icterus.  Neck: Normal range of motion. Neck supple. No JVD present. No thyromegaly present.  Cardiovascular:  Normal rate, regular rhythm, normal heart sounds and intact distal pulses.  Exam reveals no gallop and no friction rub.   No murmur heard. Pulmonary/Chest: Effort normal and breath sounds normal. No respiratory distress. She has no wheezes. She has no rales.  Abdominal: Soft. Bowel sounds are normal. She exhibits no distension and no mass. There is tenderness (mild to moderate left lower quadrant tenderness without guarding).  Musculoskeletal: Normal range of motion. She exhibits no edema and no tenderness.  Lymphadenopathy:    She has no cervical adenopathy.  Neurological: She is alert. Coordination normal.  Skin: Skin is warm and dry. No rash noted. No erythema.  Psychiatric: She has a normal mood and affect. Her behavior is normal.    ED Course  Procedures (including critical care time) Labs Review Labs Reviewed  URINALYSIS, ROUTINE W REFLEX MICROSCOPIC - Abnormal; Notable for the following:    Color, Urine RED (*)    APPearance TURBID (*)     Hgb urine dipstick LARGE (*)    Protein, ur 100 (*)    Leukocytes, UA MODERATE (*)    All other components within normal limits  URINE CULTURE  URINE MICROSCOPIC-ADD ON    Imaging Review Ct Abdomen Pelvis Wo Contrast  01/16/2014   CLINICAL DATA:  Post bladder tumor resection on Tuesday with placement of a bladder stent, now with abdominal pain which radiates to the back and left flank. Initial encounter.  EXAM: CT ABDOMEN AND PELVIS WITHOUT CONTRAST  TECHNIQUE: Multidetector CT imaging of the abdomen and pelvis was performed following the standard protocol without IV contrast.  COMPARISON:  01/05/2014  FINDINGS: The lack of intravenous contrast limits the ability to evaluate solid abdominal organs.  The patient has undergone left-sided double-J ureteral stent placement with superior coil within the left renal pelvis and inferior coil within the urinary bladder. No abnormal opacities are seen overlying the left-sided double-J ureteral stent. There is a very minimal amount of stranding about the mid/distal aspect of the left ureter at the level the pelvic brim (image 54, series 2) without associated upstream ureterectasis or pelvicaliectasis. No renal stones.  Note is made of an exaggerated horizontal lie of the contralateral right kidney. No right-sided urinary obstruction. Normal noncontrast appearance of the urinary bladder given degree distention.   --------------------------------------------------------------------------  Normal hepatic contour.  Post cholecystectomy.  No ascites.  Normal noncontrast appearance of the bilateral adrenal glands, pancreas and spleen.  Moderate colonic stool burden without evidence of enteric obstruction. The bowel is normal in course and caliber without wall thickening. Normal noncontrast appearance of the retrocecal appendix. No pneumoperitoneum, pneumatosis or portal venous gas.  Scattered atherosclerotic plaque within a normal caliber abdominal aorta.  No bulky  retroperitoneal, mesenteric, pelvic or inguinal lymphadenopathy on this noncontrast examination.  Normal noncontrast appearance of the pelvic organs. No discrete adnexal mass. No free fluid in the pelvic cul-de-sac.  Limited visualization of the lower thorax demonstrates minimal dependent ground-glass atelectasis. No discrete focal airspace opacities. No pleural effusion.  Normal heart size.  No pericardial effusion.  No acute or aggressive osseous abnormalities. Small mesenteric fat containing periumbilical hernia.  IMPRESSION: Post left-sided double-J ureteral stent placement. There is a very minimal amount of nonspecific stranding about the mid/distal aspect of the left ureter at the level of the pelvic brim without associated upstream ureterectasis or pelvicaliectasis. No renal stones or evidence urinary obstruction. Otherwise, no explanation for patient's left-sided flank pain.   Electronically Signed   By: Eldridge Abrahams.D.  On: 01/16/2014 23:21      MDM   Final diagnoses:  Left lower quadrant pain  Hematuria    The patient has unilateral lower abdominal tenderness on the same side that she received her stent, her vital signs are totally normal. I would be somewhat concerned that the patient has a urinary infection given the bloody appearance of the urine at the bedside, there also could potentially be some hydronephrosis given her recent stent procedure. CT scan, urinalysis, pain control.  CT unremarkable, UA with blood, u culture.  Pt informed and happy with plan.  Improved with dilaudid.  Meds given in ED:  Medications  cephALEXin (KEFLEX) capsule 500 mg (not administered)  HYDROmorphone (DILAUDID) injection 1 mg (1 mg Intramuscular Given 01/16/14 2202)    New Prescriptions   CEPHALEXIN (KEFLEX) 500 MG CAPSULE    Take 1 capsule (500 mg total) by mouth 4 (four) times daily.        Johnna Acosta, MD 01/16/14 [phone]

## 2014-01-16 NOTE — ED Notes (Signed)
Pt had bladder tumor resection on Tuesday with a bladder stent placed; pt states that she began having abd pain that began yesterday that radiates to her back; pt states pain has been progressively getting worse; pt states that when she urinates its only small amounts and that it burns really bad; pt called MD on call tonight and he advised to come here for possible need for catheter greater that 67f; MD recommends 20 to 83F

## 2014-01-19 LAB — URINE CULTURE: Colony Count: 75000

## 2014-01-20 ENCOUNTER — Telehealth (HOSPITAL_COMMUNITY): Payer: Self-pay

## 2014-01-20 NOTE — ED Notes (Signed)
Post ED Visit - Positive Culture Follow-up  Culture report reviewed by antimicrobial stewardship pharmacist: []  Wes Dulaney, Pharm.D., BCPS [x]  Heide Guile, Pharm.D., BCPS []  Alycia Rossetti, Pharm.D., BCPS []  Saxon, Pharm.D., BCPS, AAHIVP []  Legrand Como, Pharm.D., BCPS, AAHIVP []  Carly Sabat, Pharm.D. []  Elenor Quinones, Pharm.D.  Positive urine culture Treated with cephalexin, organism sensitive to the same and no further patient follow-up is required at this time.  Ileene Musa 01/20/2014, 9:50 AM

## 2014-01-26 ENCOUNTER — Telehealth: Payer: Self-pay | Admitting: Genetic Counselor

## 2014-01-26 NOTE — Telephone Encounter (Signed)
S/W PATIENT AND GAVE GENETIC APPT FOR 10/28 @ 11 W/KAREN POWELL.  REFERRING DR. Beryle Quant CLARK DX- FHX OF MALINGNANT NEOPLASM OF OVARY

## 2014-02-02 ENCOUNTER — Encounter: Payer: Self-pay | Admitting: Genetic Counselor

## 2014-02-02 ENCOUNTER — Ambulatory Visit (HOSPITAL_BASED_OUTPATIENT_CLINIC_OR_DEPARTMENT_OTHER): Payer: 59 | Admitting: Genetic Counselor

## 2014-02-02 ENCOUNTER — Other Ambulatory Visit: Payer: 59

## 2014-02-02 DIAGNOSIS — Z808 Family history of malignant neoplasm of other organs or systems: Secondary | ICD-10-CM

## 2014-02-02 DIAGNOSIS — Z8041 Family history of malignant neoplasm of ovary: Secondary | ICD-10-CM

## 2014-02-02 DIAGNOSIS — Z803 Family history of malignant neoplasm of breast: Secondary | ICD-10-CM

## 2014-02-02 DIAGNOSIS — Z800 Family history of malignant neoplasm of digestive organs: Secondary | ICD-10-CM

## 2014-02-02 DIAGNOSIS — Z315 Encounter for genetic counseling: Secondary | ICD-10-CM

## 2014-02-02 DIAGNOSIS — C679 Malignant neoplasm of bladder, unspecified: Secondary | ICD-10-CM

## 2014-02-02 NOTE — Progress Notes (Signed)
Dr. Tresa Endo requested a consultation for genetic counseling and risk assessment for [name], a 53 y.o. female, for discussion of her personal history of bladder cancer and family history of ovarian, breast, stomach and thyroid cancer.  She presents to clinic today to discuss the possibility of a genetic predisposition to cancer, and to further clarify her risks, as well as her family members' risks for cancer.   HISTORY OF PRESENT ILLNESS: In October 2015, at the age of 26, [name] was diagnosed with bladder cancer. This was treated with surgery and a stent.  [name] was diagnosed with squamous cell carcinoma, which was premelanoma, in 2000. This was treated with a large excision and several injections.  [name] has had two colonoscopies and has been found to have a total of 11 polyps.        Past Medical History  Diagnosis Date  . Fibromyalgia   . PONV (postoperative nausea and vomiting)     used a scop patch last surg-did well  . Hyperlipidemia   . COPD (chronic obstructive pulmonary disease)   . Prediabetes   . Restless leg syndrome   . Hypertension     normal now, only took medications for 1 month- thinks it was from pain.  Marland Kitchen Hypothyroidism   . Hemorrhoid   . Anxiety   . Depression   . ADD (attention deficit disorder)   . Bladder neoplasm   . History of melanoma excision     leg  . Iron deficiency anemia   . Cancer 2015    Bladder cancer    Past Surgical History  Procedure Laterality Date  . Ankle fusion Right 2011  . Knee arthroscopy with medial menisectomy Right 05/22/2012    Procedure: KNEE ARTHROSCOPY WITH MEDIAL MENISECTOMY and Medial Plica excision;  Surgeon: Alta Corning, MD;  Location: [place];  Service: Orthopedics;  Laterality: Right;  . Anterior cervical decomp/discectomy fusion N/A 09/01/2013    Procedure: Anterior cervical decompression fusion, cervical 5-6 with instrumentation and allograft;  Surgeon: Sinclair Ship, MD;  Location: [facility];  Service: Orthopedics;  Laterality: N/A;  Anterior cervical decompression fusion, cervical 5-6 with instrumentation and allograft  . Shoulder arthroscopy with subacromial decompression Left 11/10/2013    Procedure: SHOULDER ARTHROSCOPY WITH SUBACROMIAL DECOMPRESSION WITH ROTATOR CUFF REPAIR AND BICEPS TENODESIS;  Surgeon: Alta Corning, MD;  Location: [facility];  Service: Orthopedics;  Laterality: Left;  . Closed reduction and pinning left fifth  proximal phalangeal fx  10-09-2005  . D & c hysteroscopy with novasure endometrial ablation  07-16-2006  . Shoulder arthroscopy w/ subacromial decompression and distal clavicle excision Left 11-27-2011    W/  DEBRIDEMENT  . Cataract extraction w/ intraocular lens implant Bilateral   . Tonsillectomy  age 46  . Laparoscopic cholecystectomy  2005  . Tubal ligation  1980  . Transurethral resection of bladder tumor N/A 01/11/2014    Procedure: TRANSURETHRAL RESECTION OF BLADDER TUMOR (TURBT);  Surgeon: Festus Aloe, MD;  Location: [facility];  Service: Urology;  Laterality: N/A;  . Cystoscopy w/ retrogrades Left 01/11/2014    Procedure: CYSTOSCOPY WITH RETROGRADE PYELOGRAM;  Surgeon: Festus Aloe, MD;  Location: [facility];  Service: Urology;  Laterality: Left;  . Cystoscopy with stent placement Left 01/11/2014    Procedure: CYSTOSCOPY WITH STENT PLACEMENT;  Surgeon: Festus Aloe, MD;  Location: [facility];  Service: Urology;  Laterality: Left;    History  Social History  . Marital Status: Married    Spouse Name: N/A    Number of Children: 2  . Years of Education: N/A   Occupational History  . logistic specialist    Social History Main Topics  . Smoking status: Former Smoker -- 1.00 packs/day for 30 years    Types: Cigarettes    Quit date: 11/25/2011  . Smokeless tobacco: Never Used  . Alcohol Use: No  . Drug Use: No  . Sexual Activity:  None   Other Topics Concern  . None   Social History Narrative  . None    REPRODUCTIVE HISTORY AND PERSONAL RISK ASSESSMENT FACTORS: Menarche was at age 79.   perimenopausal Uterus Intact: yes Ovaries Intact: yes G2P2A0, first live birth at age 37  She has not previously undergone treatment for infertility.   Oral Contraceptive use: 10 years   She has not used HRT in the past.    FAMILY HISTORY:  We obtained a detailed, 4-generation family history.  Significant diagnoses are listed below: Family History  Problem Relation Age of Onset  . Cancer Father     Lung and stomach   . Heart disease Father   . Hypertension Father   . Stroke Father   . Heart failure Paternal Grandfather   . Cancer Paternal Grandfather     colon cancer  . Mental retardation Son   . Ovarian cancer Mother 18  . Breast cancer Mother     dx in her late 110s-50s  . Thyroid cancer Mother   . Ovarian cancer Sister 49  . Cancer Brother     abdominal tumor  . Ovarian cancer Maternal Aunt   . Breast cancer Maternal Aunt   . Breast cancer Maternal Uncle   . Breast cancer Paternal Aunt   . Ovarian cancer Sister 75  . Ovarian cancer Sister 44  . Cancer Brother     abdominal tumor  . Breast cancer Paternal Aunt   . Breast cancer Paternal Aunt   . Breast cancer Paternal Aunt   . Breast cancer Cousin   . Breast cancer Cousin   The patient's mother had 14-15 brothers and sisters.  Two brothers had breast cancer and all sisters had either ovarian or breast cancer.  Patient's maternal ancestors are of Caucasian descent, and paternal ancestors are of [place] descent. There is no reported Ashkenazi Jewish ancestry. There is no known consanguinity.  GENETIC COUNSELING ASSESSMENT: [name] is a 53 y.o. female with a personal history of bladder cancer and pre-melanoma and a family history of breast, ovarian, and stomach cancer which somewhat suggestive of a hereditary cancer syndrome and predisposition to  cancer. We, therefore, discussed and recommended the following at today's visit.   DISCUSSION: We reviewed the characteristics, features and inheritance patterns of hereditary cancer syndromes. We also discussed genetic testing, including the appropriate family members to test, the process of testing, insurance coverage and turn-around-time for results. We discussed the conditions that are most concerning based on her personal and family history of cancer.  Specifically, her bladder cancer and history of polyps and her family history of breast, ovarian and "stomach" cancer, is suggestive of Lynch syndrome.  We reviewed the genes associated with that condition.  We also reviewed BRCA mutations based on her family history of breast and ovarian cancer.    We discussed targeted testing and looking at genes associated with ovarian cancer, and more comprehensive testing looking at additional genes, including those associated with melanoma.  [name] chose to look at the ovarian cancer genes.  PLAN: After considering the risks, benefits, and limitations, [name] provided informed consent to pursue genetic testing and the blood sample will be sent to [facility] for analysis of the [employer] panel test. We discussed the implications of a positive, negative and/ or variant of uncertain significance genetic test result. Results should be available within approximately 2-4 weeks' time, at which point they will be disclosed by telephone to [name], as will any additional recommendations warranted by these results. [name] will receive a summary of her genetic counseling visit and a copy of her results once available. This information will also be available in Epic. We encouraged BRITHANY WHITWORTH to remain in contact with cancer genetics annually so that we can continuously update the family history and inform her of any changes in cancer genetics and testing that may be of  benefit for her family. [name]'s questions were answered to her satisfaction today. Our contact information was provided should additional questions or concerns arise.  The patient was seen for a total of 60 minutes, greater than 50% of which was spent face-to-face counseling.  This note will also be sent to the referring provider via the electronic medical record. The patient will be supplied with a summary of this genetic counseling discussion as well as educational information on the discussed hereditary cancer syndromes following the conclusion of their visit.     _______________________________________________________________________ For Office Staff:  Number of people involved in session: 1 Was an Intern/ student involved with case: no

## 2014-02-17 ENCOUNTER — Ambulatory Visit (INDEPENDENT_AMBULATORY_CARE_PROVIDER_SITE_OTHER): Payer: BC Managed Care – PPO | Admitting: Physician Assistant

## 2014-02-17 ENCOUNTER — Encounter: Payer: Self-pay | Admitting: Physician Assistant

## 2014-02-17 VITALS — BP 128/78 | HR 68 | Temp 98.1°F | Resp 16 | Ht 65.0 in | Wt 214.0 lb

## 2014-02-17 DIAGNOSIS — R799 Abnormal finding of blood chemistry, unspecified: Secondary | ICD-10-CM

## 2014-02-17 DIAGNOSIS — M79642 Pain in left hand: Secondary | ICD-10-CM

## 2014-02-17 DIAGNOSIS — E041 Nontoxic single thyroid nodule: Secondary | ICD-10-CM

## 2014-02-17 DIAGNOSIS — F32A Depression, unspecified: Secondary | ICD-10-CM

## 2014-02-17 DIAGNOSIS — E559 Vitamin D deficiency, unspecified: Secondary | ICD-10-CM

## 2014-02-17 DIAGNOSIS — M79641 Pain in right hand: Secondary | ICD-10-CM

## 2014-02-17 DIAGNOSIS — F329 Major depressive disorder, single episode, unspecified: Secondary | ICD-10-CM

## 2014-02-17 DIAGNOSIS — F909 Attention-deficit hyperactivity disorder, unspecified type: Secondary | ICD-10-CM

## 2014-02-17 DIAGNOSIS — Z113 Encounter for screening for infections with a predominantly sexual mode of transmission: Secondary | ICD-10-CM

## 2014-02-17 DIAGNOSIS — E079 Disorder of thyroid, unspecified: Secondary | ICD-10-CM

## 2014-02-17 DIAGNOSIS — R5382 Chronic fatigue, unspecified: Secondary | ICD-10-CM

## 2014-02-17 DIAGNOSIS — M25579 Pain in unspecified ankle and joints of unspecified foot: Secondary | ICD-10-CM

## 2014-02-17 DIAGNOSIS — F988 Other specified behavioral and emotional disorders with onset usually occurring in childhood and adolescence: Secondary | ICD-10-CM

## 2014-02-17 LAB — BASIC METABOLIC PANEL WITH GFR
BUN: 13 mg/dL (ref 6–23)
CO2: 25 mEq/L (ref 19–32)
Calcium: 9.2 mg/dL (ref 8.4–10.5)
Chloride: 106 mEq/L (ref 96–112)
Creat: 0.59 mg/dL (ref 0.50–1.10)
GFR, Est African American: >89 mL/min
GFR, Est Non African American: >89 mL/min
Glucose, Bld: 83 mg/dL (ref 70–99)
Potassium: 4.1 mEq/L (ref 3.5–5.3)
Sodium: 139 mEq/L (ref 135–145)

## 2014-02-17 LAB — CBC WITH DIFFERENTIAL/PLATELET
Basophils Absolute: 0 10*3/uL (ref 0.0–0.1)
Basophils Relative: 0 % (ref 0–1)
Eosinophils Absolute: 0.2 10*3/uL (ref 0.0–0.7)
Eosinophils Relative: 2 % (ref 0–5)
HCT: 41.9 % (ref 36.0–46.0)
Hemoglobin: 13.8 g/dL (ref 12.0–15.0)
Lymphocytes Relative: 29 % (ref 12–46)
Lymphs Abs: 3.3 10*3/uL (ref 0.7–4.0)
MCH: 29.4 pg (ref 26.0–34.0)
MCHC: 32.9 g/dL (ref 30.0–36.0)
MCV: 89.3 fL (ref 78.0–100.0)
Monocytes Absolute: 0.6 10*3/uL (ref 0.1–1.0)
Monocytes Relative: 5 % (ref 3–12)
Neutro Abs: 7.4 10*3/uL (ref 1.7–7.7)
Neutrophils Relative %: 64 % (ref 43–77)
Platelets: 333 10*3/uL (ref 150–400)
RBC: 4.69 MIL/uL (ref 3.87–5.11)
RDW: 13.5 % (ref 11.5–15.5)
WBC: 11.5 10*3/uL — ABNORMAL HIGH (ref 4.0–10.5)

## 2014-02-17 LAB — RPR

## 2014-02-17 LAB — HEPATIC FUNCTION PANEL
ALT: 29 U/L (ref 0–35)
AST: 27 U/L (ref 0–37)
Albumin: 4.2 g/dL (ref 3.5–5.2)
Alkaline Phosphatase: 79 U/L (ref 39–117)
Bilirubin, Direct: 0.1 mg/dL (ref 0.0–0.3)
Indirect Bilirubin: 0.3 mg/dL (ref 0.2–1.2)
Total Bilirubin: 0.4 mg/dL (ref 0.2–1.2)
Total Protein: 6.7 g/dL (ref 6.0–8.3)

## 2014-02-17 LAB — HIV ANTIBODY (ROUTINE TESTING W REFLEX): HIV 1&2 Ab, 4th Generation: NONREACTIVE

## 2014-02-17 LAB — URIC ACID: Uric Acid, Serum: 4 mg/dL (ref 2.4–7.0)

## 2014-02-17 LAB — RHEUMATOID FACTOR: Rhuematoid fact SerPl-aCnc: <10 IU/mL (ref <=14)

## 2014-02-17 LAB — MAGNESIUM: Magnesium: 2 mg/dL (ref 1.5–2.5)

## 2014-02-17 LAB — TSH: TSH: 3.584 u[IU]/mL (ref 0.350–4.500)

## 2014-02-17 MED ORDER — FLUVOXAMINE MALEATE 100 MG PO TABS: 100.0000 mg | ORAL_TABLET | Freq: Every day | ORAL | Status: DC

## 2014-02-17 MED ORDER — AMPHETAMINE-DEXTROAMPHETAMINE 20 MG PO TABS: 20.0000 mg | ORAL_TABLET | Freq: Two times a day (BID) | ORAL | Status: DC

## 2014-02-17 MED ORDER — LEVOTHYROXINE SODIUM 75 MCG PO TABS: 75.0000 ug | ORAL_TABLET | Freq: Every day | ORAL | Status: DC

## 2014-02-17 MED ORDER — CLONAZEPAM 2 MG PO TABS: 2.0000 mg | ORAL_TABLET | Freq: Two times a day (BID) | ORAL | Status: DC

## 2014-02-17 NOTE — Patient Instructions (Signed)
Brintellix stop it and try the Luvox 1/2 pill daily for 3-5 days, can increase to once daily Take the pain medicaitons at least once a day or up to twice a day.   Rheumatoid Arthritis Rheumatoid arthritis is a long-term (chronic) inflammatory disease that causes pain, swelling, and stiffness of the joints. It can affect the entire body, including the eyes and lungs. The effects of rheumatoid arthritis vary widely among those with the condition. CAUSES  The cause of rheumatoid arthritis is not known. It tends to run in families and is more common in women. Certain cells of the body's natural defense system (immune system) do not work properly and begin to attack healthy joints. It primarily involves the connective tissue that lines the joints (synovial membrane). This can cause damage to the joint. SYMPTOMS   Pain, stiffness, swelling, and decreased motion of many joints, especially in the hands and feet.  Stiffness that is worse in the morning. It may last 1-2 hours or longer.  Numbness and tingling in the hands.  Fatigue.  Loss of appetite.  Weight loss.  Low-grade fever.  Dry eyes and mouth.  Firm lumps (rheumatoid nodules) that grow beneath the skin in areas such as the elbows and hands. DIAGNOSIS  Diagnosis is based on the symptoms described, an exam, and blood tests. Sometimes, X-rays are helpful. TREATMENT  The goals of treatment are to relieve pain, reduce inflammation, and to slow down or stop joint damage and disability. Methods vary and may include:  Maintaining a balance of rest, exercise, and proper nutrition.  Medicines:  Pain relievers (analgesics).  Corticosteroids and nonsteroidal anti-inflammatory drugs (NSAIDs) to reduce inflammation.  Disease-modifying antirheumatic drugs (DMARDs) to try to slow the course of the disease.  Biologic response modifiers to reduce inflammation and damage.  Physical therapy and occupational therapy.  Surgery for patients  with severe joint damage. Joint replacement or fusing of joints may be needed.  Routine monitoring and ongoing care, such as office visits, blood and urine tests, and X-rays. HOME CARE INSTRUCTIONS   Remain physically active and reduce activity when the disease gets worse.  Eat a well-balanced diet.  Put heat on affected joints when you wake up and before activities. Keep the heat on the affected joint for as long as directed by your health care provider.  Put ice on affected joints following activities or exercising.  Put ice in a plastic bag.  Place a towel between your skin and the bag.  Leave the ice on for 15-20 minutes, 3-4 times per day, or as directed by your health care provider.  Take medicines and supplements only as directed by your health care provider.  Use splints as directed by your health care provider. Splints help maintain joint position and function.  Do not sleep with pillows under your knees. This may lead to spasms.  Participate in a self-management program to keep current with the latest treatment and coping skills. SEEK IMMEDIATE MEDICAL CARE IF:  You have fainting episodes.  You have periods of extreme weakness.  You rapidly develop a hot, painful joint that is more severe than usual joint aches.  You have chills.  You have a fever. FOR MORE INFORMATION   [employer]: www.rheumatology.[place]: www.arthritis.org Document Released: 03/22/2000 Document Revised: 08/09/2013 Document Reviewed: 05/01/2011 [facility] Patient Information 2015 [place], [place]. This information is not intended to replace advice given to you by your health care provider. Make sure you discuss any questions you have  with your health care provider.  

## 2014-02-17 NOTE — Progress Notes (Signed)
HPI 53 y.o.female presents for chronic pain and recent diagnosis of bladder cancer. She had resection and stent placement with Dr. Junious Silk and is now on cipro for infection. She also had genetic testing for BRCA and Lynch syndromre but she is here today due to chronic pain.   She has pain every where and has resigned from her job due to this. She states she is unable to use her hands in the morning for several hours due to stiffness and she has billateral feet pain and has diffuse arthralgias, in her neck, back, shoulders and hips. She was on lyrica which helped but stopped it because she was still having pain on it. She states oxycodone does help, she is going to apply for disability. She states that she has seen a rheumatologist in the past that diagnosed her with RA but she is not on medication.   Her thyroid medication was changed last time, she is 55mg daily, she states she has a hard time swallowing and her mother has had thyroid cancer.  Lab Results  Component Value Date   TSH 8.935* 01/14/2014    Past Medical History  Diagnosis Date  . Fibromyalgia   . PONV (postoperative nausea and vomiting)     used a scop patch last surg-did well  . Hyperlipidemia   . COPD (chronic obstructive pulmonary disease)   . Prediabetes   . Restless leg syndrome   . Hypertension     normal now, only took medications for 1 month- thinks it was from pain.  .Marland KitchenHypothyroidism   . Hemorrhoid   . Anxiety   . Depression   . ADD (attention deficit disorder)   . Bladder neoplasm   . History of melanoma excision     leg  . Iron deficiency anemia   . Cancer 2015    Bladder cancer     Allergies  Allergen Reactions  . Sulfa Antibiotics Hives and Itching      Current Outpatient Prescriptions on File Prior to Visit  Medication Sig Dispense Refill  . amphetamine-dextroamphetamine (ADDERALL) 20 MG tablet Take 1 tablet (20 mg total) by mouth 2 (two) times daily. 60 tablet 0  . cephALEXin (KEFLEX) 500 MG  capsule Take 1 capsule (500 mg total) by mouth 4 (four) times daily. 40 capsule 0  . cholecalciferol (VITAMIN D) 1000 UNITS tablet Take 7,000 Units by mouth at bedtime.     . clonazePAM (KLONOPIN) 2 MG tablet Take 1 tablet (2 mg total) by mouth 2 (two) times daily. 60 tablet 1  . ferrous sulfate 325 (65 FE) MG tablet Take 325 mg by mouth at bedtime.    .Marland Kitchenlevothyroxine (SYNTHROID, LEVOTHROID) 75 MCG tablet Take 1 tablet (75 mcg total) by mouth daily before breakfast. 90 tablet 0  . Magnesium 250 MG TABS Take 250 mg by mouth at bedtime.    . methocarbamol (ROBAXIN-750) 750 MG tablet Take 1 tablet (750 mg total) by mouth every 8 (eight) hours as needed for muscle spasms. 40 tablet 0  . oxybutynin (DITROPAN) 5 MG tablet Take 1 tablet (5 mg total) by mouth every 8 (eight) hours as needed for bladder spasms. 30 tablet 0  . oxyCODONE-acetaminophen (PERCOCET/ROXICET) 5-325 MG per tablet Take 1-2 tablets by mouth every 6 (six) hours as needed for severe pain. 40 tablet 0  . pregabalin (LYRICA) 200 MG capsule Take 1 capsule (200 mg total) by mouth 3 (three) times daily. 90 capsule 3  . Vortioxetine HBr (BRINTELLIX) 10 MG TABS Once  daily 30 tablet 0   Current Facility-Administered Medications on File Prior to Visit  Medication Dose Route Frequency Provider Last Rate Last Dose  . mitoMYcin (MUTAMYCIN) chemo injection 40 mg  40 mg Bladder Instillation Once Festus Aloe, MD        ROS: all negative except above.   Physical Exam: Filed Weights   02/17/14 1134  Weight: 214 lb (97.07 kg)   BP 128/78 mmHg  Pulse 68  Temp(Src) 98.1 F (36.7 C)  Resp 16  Ht _0  (1.651 m)  Wt 214 lb (97.07 kg)  BMI 35.61 kg/m2 General Appearance: Well nourished, in no apparent distress. Eyes: PERRLA, EOMs, conjunctiva no swelling or erythema Sinuses: No Frontal/maxillary tenderness ENT/Mouth: Ext aud canals clear, TMs without erythema, bulging. No erythema, swelling, or exudate on post pharynx.  Tonsils not  swollen or erythematous. Hearing normal.  Neck: Supple, thyroid slightly enlarged with nodule on left side.  Respiratory: Respiratory effort normal, BS equal bilaterally without rales, rhonchi, wheezing or stridor.  Cardio: RRR with no MRGs. Brisk peripheral pulses without edema.  Abdomen: Soft, + BS.  Non tender, no guarding, rebound, hernias, masses. Lymphatics: Non tender without lymphadenopathy.  Musculoskeletal: Full ROM, 5/5 strength, gait antalgic with cane, bilateral hands with swelling, erythema at MCP and PIP joints.  Skin: Warm, dry without rashes, lesions, ecchymosis.  Neuro: Cranial nerves intact. Normal muscle tone, no cerebellar symptoms. Sensation intact.  Psych: Awake and oriented X 3, normal affect, Insight and Judgment appropriate.     Assessment and Plan: Chronic pain- bilateral hand/feet pain with stiffness-? RA- get labs and refer to Dr. Ouida Sills. She saw Dr. Berenice Primas and got 20 pills yesterday from him, will fill here next time to be consistent with one doctor but will send to pain management if RA negative and pain continues.  Hypothyroidism- with family history of thyroid cancer, check TSH and get [place] of thyroid Bipolar/OCD/Depression- she is on the klonopin, states the brintellix is not helping will try Fluvox ADD- continue adderall.     Vicie Mutters, PA-C 11:59 AM  OVER 40 minutes of exam, counseling, chart review, referral performed

## 2014-02-18 ENCOUNTER — Other Ambulatory Visit: Payer: Self-pay

## 2014-02-18 LAB — LYME ABY, WSTRN BLT IGG & IGM W/BANDS

## 2014-02-18 LAB — ANTI-DNA ANTIBODY, DOUBLE-STRANDED: ds DNA Ab: 1 IU/mL

## 2014-02-18 LAB — ANA: Anti Nuclear Antibody(ANA): NEGATIVE

## 2014-02-18 LAB — SEDIMENTATION RATE: Sed Rate: 9 mm/hr (ref 0–22)

## 2014-02-21 LAB — HLA-B27 ANTIGEN: DNA Result:: NOT DETECTED

## 2014-02-21 LAB — ROCKY MTN SPOTTED FVR ABS PNL(IGG+IGM)
RMSF IgG: 0.16 IV
RMSF IgM: 0.22 IV

## 2014-02-22 ENCOUNTER — Ambulatory Visit
Admission: RE | Admit: 2014-02-22 | Discharge: 2014-02-22 | Disposition: A | Discharge status: Home / Self Care | Payer: BC Managed Care – PPO | Source: Ambulatory Visit | Attending: Physician Assistant | Admitting: Physician Assistant

## 2014-02-22 DIAGNOSIS — E041 Nontoxic single thyroid nodule: Secondary | ICD-10-CM

## 2014-02-25 ENCOUNTER — Encounter: Payer: Self-pay | Admitting: Emergency Medicine

## 2014-02-26 ENCOUNTER — Encounter: Payer: Self-pay | Admitting: *Deleted

## 2014-03-04 ENCOUNTER — Telehealth: Payer: Self-pay | Admitting: Genetic Counselor

## 2014-03-04 ENCOUNTER — Encounter: Payer: Self-pay | Admitting: Genetic Counselor

## 2014-03-04 DIAGNOSIS — Z1379 Encounter for other screening for genetic and chromosomal anomalies: Secondary | ICD-10-CM | POA: Insufficient documentation

## 2014-03-04 NOTE — Telephone Encounter (Signed)
Left a good news message on cell.  Asked that she CB.

## 2014-03-04 NOTE — Telephone Encounter (Signed)
Revealed negative genetic testing.    

## 2014-03-24 ENCOUNTER — Other Ambulatory Visit: Payer: Self-pay | Admitting: Physician Assistant

## 2014-03-24 DIAGNOSIS — F988 Other specified behavioral and emotional disorders with onset usually occurring in childhood and adolescence: Secondary | ICD-10-CM

## 2014-03-24 MED ORDER — AMPHETAMINE-DEXTROAMPHETAMINE 20 MG PO TABS: 20.0000 mg | ORAL_TABLET | Freq: Two times a day (BID) | ORAL | Status: DC

## 2014-04-19 ENCOUNTER — Other Ambulatory Visit: Payer: Self-pay | Admitting: Physician Assistant

## 2014-04-19 DIAGNOSIS — F988 Other specified behavioral and emotional disorders with onset usually occurring in childhood and adolescence: Secondary | ICD-10-CM

## 2014-04-19 DIAGNOSIS — E079 Disorder of thyroid, unspecified: Secondary | ICD-10-CM

## 2014-04-19 MED ORDER — LEVOTHYROXINE SODIUM 75 MCG PO TABS: 75.0000 ug | ORAL_TABLET | Freq: Every day | ORAL | Status: DC

## 2014-04-19 MED ORDER — AMPHETAMINE-DEXTROAMPHETAMINE 20 MG PO TABS: 20.0000 mg | ORAL_TABLET | Freq: Two times a day (BID) | ORAL | Status: DC

## 2014-05-11 ENCOUNTER — Other Ambulatory Visit: Payer: Self-pay | Admitting: Physician Assistant

## 2014-06-21 ENCOUNTER — Other Ambulatory Visit: Payer: Self-pay | Admitting: Physician Assistant

## 2014-06-21 ENCOUNTER — Other Ambulatory Visit: Payer: Self-pay

## 2014-06-21 DIAGNOSIS — F411 Generalized anxiety disorder: Secondary | ICD-10-CM

## 2014-06-21 MED ORDER — CLONAZEPAM 2 MG PO TABS: 2.0000 mg | ORAL_TABLET | Freq: Two times a day (BID) | ORAL | Status: DC

## 2014-06-21 MED ORDER — PREGABALIN 75 MG PO CAPS: 75.0000 mg | ORAL_CAPSULE | Freq: Two times a day (BID) | ORAL | Status: DC

## 2014-07-04 ENCOUNTER — Encounter: Payer: Self-pay | Admitting: Physician Assistant

## 2014-07-05 ENCOUNTER — Ambulatory Visit (INDEPENDENT_AMBULATORY_CARE_PROVIDER_SITE_OTHER): Payer: BLUE CROSS/BLUE SHIELD | Admitting: Physician Assistant

## 2014-07-05 ENCOUNTER — Encounter: Payer: Self-pay | Admitting: Physician Assistant

## 2014-07-05 VITALS — BP 122/80 | HR 60 | Temp 97.7°F | Resp 16 | Ht 65.0 in | Wt 213.0 lb

## 2014-07-05 DIAGNOSIS — M5410 Radiculopathy, site unspecified: Secondary | ICD-10-CM

## 2014-07-05 DIAGNOSIS — G2581 Restless legs syndrome: Secondary | ICD-10-CM

## 2014-07-05 DIAGNOSIS — K2100 Gastro-esophageal reflux disease with esophagitis, without bleeding: Secondary | ICD-10-CM

## 2014-07-05 DIAGNOSIS — Z0001 Encounter for general adult medical examination with abnormal findings: Secondary | ICD-10-CM

## 2014-07-05 DIAGNOSIS — M797 Fibromyalgia: Secondary | ICD-10-CM

## 2014-07-05 DIAGNOSIS — E079 Disorder of thyroid, unspecified: Secondary | ICD-10-CM

## 2014-07-05 DIAGNOSIS — C801 Malignant (primary) neoplasm, unspecified: Secondary | ICD-10-CM

## 2014-07-05 DIAGNOSIS — R6889 Other general symptoms and signs: Secondary | ICD-10-CM

## 2014-07-05 DIAGNOSIS — F3181 Bipolar II disorder: Secondary | ICD-10-CM

## 2014-07-05 DIAGNOSIS — E669 Obesity, unspecified: Secondary | ICD-10-CM | POA: Insufficient documentation

## 2014-07-05 DIAGNOSIS — E559 Vitamin D deficiency, unspecified: Secondary | ICD-10-CM

## 2014-07-05 DIAGNOSIS — Z1379 Encounter for other screening for genetic and chromosomal anomalies: Secondary | ICD-10-CM

## 2014-07-05 DIAGNOSIS — F329 Major depressive disorder, single episode, unspecified: Secondary | ICD-10-CM

## 2014-07-05 DIAGNOSIS — J449 Chronic obstructive pulmonary disease, unspecified: Secondary | ICD-10-CM

## 2014-07-05 DIAGNOSIS — Z79899 Other long term (current) drug therapy: Secondary | ICD-10-CM

## 2014-07-05 DIAGNOSIS — E785 Hyperlipidemia, unspecified: Secondary | ICD-10-CM

## 2014-07-05 DIAGNOSIS — F32A Depression, unspecified: Secondary | ICD-10-CM

## 2014-07-05 DIAGNOSIS — I700 Atherosclerosis of aorta: Secondary | ICD-10-CM

## 2014-07-05 DIAGNOSIS — R7303 Prediabetes: Secondary | ICD-10-CM

## 2014-07-05 DIAGNOSIS — F988 Other specified behavioral and emotional disorders with onset usually occurring in childhood and adolescence: Secondary | ICD-10-CM

## 2014-07-05 DIAGNOSIS — I10 Essential (primary) hypertension: Secondary | ICD-10-CM

## 2014-07-05 DIAGNOSIS — E2839 Other primary ovarian failure: Secondary | ICD-10-CM

## 2014-07-05 DIAGNOSIS — R7309 Other abnormal glucose: Secondary | ICD-10-CM

## 2014-07-05 LAB — CBC WITH DIFFERENTIAL/PLATELET
Basophils Absolute: 0 10*3/uL (ref 0.0–0.1)
Basophils Relative: 0 % (ref 0–1)
Eosinophils Absolute: 0.2 10*3/uL (ref 0.0–0.7)
Eosinophils Relative: 2 % (ref 0–5)
HCT: 43 % (ref 36.0–46.0)
Hemoglobin: 14.1 g/dL (ref 12.0–15.0)
Lymphocytes Relative: 36 % (ref 12–46)
Lymphs Abs: 3.9 10*3/uL (ref 0.7–4.0)
MCH: 29.6 pg (ref 26.0–34.0)
MCHC: 32.8 g/dL (ref 30.0–36.0)
MCV: 90.1 fL (ref 78.0–100.0)
MPV: 9.9 fL (ref 8.6–12.4)
Monocytes Absolute: 0.7 10*3/uL (ref 0.1–1.0)
Monocytes Relative: 7 % (ref 3–12)
Neutro Abs: 5.9 10*3/uL (ref 1.7–7.7)
Neutrophils Relative %: 55 % (ref 43–77)
Platelets: 330 10*3/uL (ref 150–400)
RBC: 4.77 MIL/uL (ref 3.87–5.11)
RDW: 14.2 % (ref 11.5–15.5)
WBC: 10.7 10*3/uL — ABNORMAL HIGH (ref 4.0–10.5)

## 2014-07-05 MED ORDER — AMPHETAMINE-DEXTROAMPHETAMINE 20 MG PO TABS: 20.0000 mg | ORAL_TABLET | Freq: Two times a day (BID) | ORAL | Status: DC

## 2014-07-05 MED ORDER — ALBUTEROL SULFATE HFA 108 (90 BASE) MCG/ACT IN AERS: 2.0000 | INHALATION_SPRAY | RESPIRATORY_TRACT | Status: DC | PRN

## 2014-07-05 MED ORDER — GABAPENTIN 300 MG PO CAPS: 300.0000 mg | ORAL_CAPSULE | Freq: Three times a day (TID) | ORAL | Status: DC

## 2014-07-05 MED ORDER — BENZONATATE 100 MG PO CAPS: 200.0000 mg | ORAL_CAPSULE | Freq: Three times a day (TID) | ORAL | Status: DC | PRN

## 2014-07-05 MED ORDER — FLUVOXAMINE MALEATE 100 MG PO TABS: 100.0000 mg | ORAL_TABLET | Freq: Every day | ORAL | Status: DC

## 2014-07-05 MED ORDER — LEVOTHYROXINE SODIUM 75 MCG PO TABS: 75.0000 ug | ORAL_TABLET | Freq: Every day | ORAL | Status: DC

## 2014-07-05 MED ORDER — MONTELUKAST SODIUM 10 MG PO TABS: 10.0000 mg | ORAL_TABLET | Freq: Every day | ORAL | Status: DC

## 2014-07-05 NOTE — Patient Instructions (Addendum)
See psych doctor, call your insurance and figure out who is in your network.  Discuss with Dr. Berenice Primas if needs pain management Start on the luvox     Bad carbs also include fruit juice, alcohol, and sweet tea. These are empty calories that do not signal to your brain that you are full.   Please remember the good carbs are still carbs which convert into sugar. So please measure them out no more than 1/2-1 cup of rice, oatmeal, pasta, and beans  Veggies are however free foods! Pile them on.   Not all fruit is created equal. Please see the list below, the fruit at the bottom is higher in sugars than the fruit at the top. Please avoid all dried fruits.     Before you even begin to attack a weight-loss plan, it pays to remember this: You are not fat. You have fat. Losing weight isn't about blame or shame; it's simply another achievement to accomplish. Dieting is like any other skill-you have to buckle down and work at it. As long as you act in a smart, reasonable way, you'll ultimately get where you want to be. Here are some weight loss pearls for you.  1. It's Not a Diet. It's a Lifestyle Thinking of a diet as something you're on and suffering through only for the short term doesn't work. To shed weight and keep it off, you need to make permanent changes to the way you eat. It's OK to indulge occasionally, of course, but if you cut calories temporarily and then revert to your old way of eating, you'll gain back the weight quicker than you can say yo-yo. Use it to lose it. Research shows that one of the best predictors of long-term weight loss is how many pounds you drop in the first month. For that reason, nutritionists often suggest being stricter for the first two weeks of your new eating strategy to build momentum. Cut out added sugar and alcohol and avoid unrefined carbs. After that, figure out how you can reincorporate them in a way that's healthy and maintainable.  2. There's a Right Way to  Exercise Working out burns calories and fat and boosts your metabolism by building muscle. But those trying to lose weight are notorious for overestimating the number of calories they burn and underestimating the amount they take in. Unfortunately, your system is biologically programmed to hold on to extra pounds and that means when you start exercising, your body senses the deficit and ramps up its hunger signals. If you're not diligent, you'll eat everything you burn and then some. Use it to lose it. Cardio gets all the exercise glory, but strength and interval training are the real heroes. They help you build lean muscle, which in turn increases your metabolism and calorie-burning ability 3. Don't Overreact to Mild Hunger Some people have a hard time losing weight because of hunger anxiety. To them, being hungry is bad-something to be avoided at all costs-so they carry snacks with them and eat when they don't need to. Others eat because they're stressed out or bored. While you never want to get to the point of being ravenous (that's when bingeing is likely to happen), a hunger pang, a craving, or the fact that it's 3:00 p.m. should not send you racing for the vending machine or obsessing about the energy bar in your purse. Ideally, you should put off eating until your stomach is growling and it's difficult to concentrate.  Use it to lose  it. When you feel the urge to eat, use the HALT method. Ask yourself, Am I really hungry? Or am I angry or anxious, lonely or bored, or tired? If you're still not certain, try the apple test. If you're truly hungry, an apple should seem delicious; if it doesn't, something else is going on. Or you can try drinking water and making yourself busy, if you are still hungry try a healthy snack.  4. Not All Calories Are Created Equal The mechanics of weight loss are pretty simple: Take in fewer calories than you use for energy. But the kind of food you eat makes all the  difference. Processed food that's high in saturated fat and refined starch or sugar can cause inflammation that disrupts the hormone signals that tell your brain you're full. The result: You eat a lot more.  Use it to lose it. Clean up your diet. Swap in whole, unprocessed foods, including vegetables, lean protein, and healthy fats that will fill you up and give you the biggest nutritional bang for your calorie buck. In a few weeks, as your brain starts receiving regular hunger and fullness signals once again, you'll notice that you feel less hungry overall and naturally start cutting back on the amount you eat.  5. Protein, Produce, and Plant-Based Fats Are Your Weight-Loss Trinity Here's why eating the three Ps regularly will help you drop pounds. Protein fills you up. You need it to build lean muscle, which keeps your metabolism humming so that you can torch more fat. People in a weight-loss program who ate double the recommended daily allowance for protein (about 110 grams for a 150-pound woman) lost 70 percent of their weight from fat, while people who ate the RDA lost only about 40 percent, one study found. Produce is packed with filling fiber. "It's very difficult to consume too many calories if you're eating a lot of vegetables. Example: Three cups of broccoli is a lot of food, yet only 93 calories. (Fruit is another story. It can be easy to overeat and can contain a lot of calories from sugar, so be sure to monitor your intake.) Plant-based fats like olive oil and those in avocados and nuts are healthy and extra satiating.  Use it to lose it. Aim to incorporate each of the three Ps into every meal and snack. People who eat protein throughout the day are able to keep weight off, according to a study in the [place] [employer] of Clinical Nutrition. In addition to meat, poultry and seafood, good sources are beans, lentils, eggs, tofu, and yogurt. As for fat, keep portion sizes in check by measuring out  salad dressing, oil, and nut butters (shoot for one to two tablespoons). Finally, eat veggies or a little fruit at every meal. People who did that consumed 308 fewer calories but didn't feel any hungrier than when they didn't eat more produce.  7. How You Eat Is As Important As What You Eat In order for your brain to register that you're full, you need to focus on what you're eating. Sit down whenever you eat, preferably at a table. Turn off the TV or computer, put down your phone, and look at your food. Smell it. Chew slowly, and don't put another bite on your fork until you swallow. When women ate lunch this attentively, they consumed 30 percent less when snacking later than those who listened to an audiobook at lunchtime, according to a study in the [place] of Nutrition. 8. Weighing Yourself  Really Works The scale provides the best evidence about whether your efforts are paying off. Seeing the numbers tick up or down or stagnate is motivation to keep going-or to rethink your approach. A 2015 study at [facility] found that daily weigh-ins helped people lose more weight, keep it off, and maintain that loss, even after two years. Use it to lose it. Step on the scale at the same time every day for the best results. If your weight shoots up several pounds from one weigh-in to the next, don't freak out. Eating a lot of salt the night before or having your period is the likely culprit. The number should return to normal in a day or two. It's a steady climb that you need to do something about. 9. Too Much Stress and Too Little Sleep Are Your Enemies When you're tired and frazzled, your body cranks up the production of cortisol, the stress hormone that can cause carb cravings. Not getting enough sleep also boosts your levels of ghrelin, a hormone associated with hunger, while suppressing leptin, a hormone that signals fullness and satiety. People on a diet who slept only five and a half hours a  night for two weeks lost 55 percent less fat and were hungrier than those who slept eight and a half hours, according to a study in the [place]. Use it to lose it. Prioritize sleep, aiming for seven hours or more a night, which research shows helps lower stress. And make sure you're getting quality zzz's. If a snoring spouse or a fidgety cat wakes you up frequently throughout the night, you may end up getting the equivalent of just four hours of sleep, according to a study from [facility]. Keep pets out of the bedroom, and use a white-noise app to drown out snoring. 10. You Will Hit a plateau-And You Can Bust Through It As you slim down, your body releases much less leptin, the fullness hormone.  If you're not strength training, start right now. Building muscle can raise your metabolism to help you overcome a plateau. To keep your body challenged and burning calories, incorporate new moves and more intense intervals into your workouts or add another sweat session to your weekly routine. Alternatively, cut an extra 100 calories or so a day from your diet. Now that you've lost weight, your body simply doesn't need as much fuel.

## 2014-07-05 NOTE — Progress Notes (Signed)
Complete Physical Assessment and Plan: 1. Essential hypertension - continue medications, DASH diet, exercise and monitor at home. Call if greater than 130/80.  - CBC with Differential/Platelet - BASIC METABOLIC PANEL WITH GFR - TSH - Hepatic function panel - Microalbumin / creatinine urine ratio - EKG 12-Lead - Urinalysis, Routine w reflex microscopic  2. Chronic obstructive pulmonary disease, unspecified COPD, unspecified chronic bronchitis type Q smoking 2011, no need for ABX at this time and patient agrees.  - DG Chest 2 View; Future - albuterol (VENTOLIN HFA) 108 (90 BASE) MCG/ACT inhaler; Inhale 2 puffs into the lungs every 4 (four) hours as needed for wheezing or shortness of breath.  Dispense: 1 Inhaler; Refill: 3 - montelukast (SINGULAIR) 10 MG tablet; Take 1 tablet (10 mg total) by mouth daily.  Dispense: 30 tablet; Refill: 2 - benzonatate (TESSALON PERLES) 100 MG capsule; Take 2 capsules (200 mg total) by mouth 3 (three) times daily as needed for cough (Max: 600mg  per day (6 capsules per day)).  Dispense: 120 capsule; Refill: 0  3. Gastroesophageal reflux disease with esophagitis Continue PPI/H2 blocker, diet discussed  4. Prediabetes Discussed general issues about diabetes pathophysiology and management., Educational material distributed., Suggested low cholesterol diet., Encouraged aerobic exercise., Discussed foot care., Reminded to get yearly retinal exam. - Hemoglobin A1c - Insulin, fasting - LOW EXTREMITY NEUR EXAM DOCUM  5. Thyroid disease Hypothyroidism-check TSH level, continue medications the same, reminded to take on an empty stomach 30-57mins before food.  - levothyroxine (SYNTHROID, LEVOTHROID) 75 MCG tablet; Take 1 tablet (75 mcg total) by mouth daily before breakfast.  Dispense: 90 tablet; Refill: 0  6. Radiculopathy, unspecified spinal region Continue ortho follow up - gabapentin (NEURONTIN) 300 MG capsule; Take 1 capsule (300 mg total) by mouth 3 (three)  times daily.  Dispense: 90 capsule; Refill: 2  7. Fibromyalgia Continue follow up with ortho, declines pain management at this time Wants to try neurontin due to cost of lyrica.  - gabapentin (NEURONTIN) 300 MG capsule; Take 1 capsule (300 mg total) by mouth 3 (three) times daily.  Dispense: 90 capsule; Refill: 2 - amphetamine-dextroamphetamine (ADDERALL) 20 MG tablet; Take 1 tablet (20 mg total) by mouth 2 (two) times daily.  Dispense: 60 tablet; Refill: 0  8. Hyperlipidemia -continue medications, check lipids, decrease fatty foods, increase activity. - Lipid panel  9. Bipolar 2 disorder, major depressive episode ? Biopolar, + pressured speech but no pure manic episodes, strongly encouraged to go to psych for complete evaluation. No SI/HI - fluvoxaMINE (LUVOX) 100 MG tablet; Take 1 tablet (100 mg total) by mouth at bedtime.  Dispense: 90 tablet; Refill: 0  10. Restless leg syndrome Continue klonopin - Magnesium  11. Cancer Bladder cancer, continue follow up with Dr. Junious Silk.   12. Estrogen deficiency - DG Bone Density; Future  13. Obesity Obesity with co morbidities- long discussion about weight loss, diet, and exercise  14. Vitamin D deficiency - Vit D  25 hydroxy (rtn osteoporosis monitoring)  15. Thoracic aorta atherosclerosis Control blood pressure, cholesterol, glucose, increase exercise.   16. ADD (attention deficit disorder) - amphetamine-dextroamphetamine (ADDERALL) 20 MG tablet; Take 1 tablet (20 mg total) by mouth 2 (two) times daily.  Dispense: 60 tablet; Refill: 0  17. Depression Depression- continue medications for now, add back luvox but STRONGLY suggested to patient to follow up with psych, she has OCD/anxiety and with recent bladder cancer diagnosis has progressed, explained that she needs more time than we are able to provide here at a  visit and it is in her best interest to go to psych.  No SI/HI  stress management techniques discussed, increase water,  good sleep hygiene discussed, increase exercise, and increase veggies.  - fluvoxaMINE (LUVOX) 100 MG tablet; Take 1 tablet (100 mg total) by mouth at bedtime.  Dispense: 90 tablet; Refill: 0   HPI 54 y.o. female  presents for a complete physical. Her blood pressure has been controlled at home, today their BP is BP: 122/80 mmHg She does not workout. She denies chest pain, shortness of breath, dizziness.  She is not on cholesterol medication and denies myalgias. Her cholesterol is at goal. The cholesterol last visit was:  Lab Results  Component Value Date   CHOL 174 01/14/2014   HDL 49 01/14/2014   LDLCALC 94 01/14/2014   TRIG 157* 01/14/2014   CHOLHDL 3.6 01/14/2014  She has not been working on diet and exercise for prediabetes, and denies hyperglycemia, nausea, polydipsia and polyuria. Last A1C in the office was:  Lab Results  Component Value Date   HGBA1C 5.7* 01/14/2014  Patient is on Vitamin D supplement.   Lab Results  Component Value Date   VD25OH 74 09/30/2013  She is on thyroid medication. Her medication was not changed last visit. Patient denies diarrhea, heat / cold intolerance and palpitations.  Lab Results  Component Value Date   TSH 3.584 02/17/2014  She has fibromyalgia and RLS- she states the klonopin helps her sleep and helps her mood, her adderall helps with energy/brain fog, the lyrica has helped her pain.  Her back has been hurting, she fell and broke her sacrum.  She had bladder tumor resection and stent placement with Dr. Junious Silk, also had negative genetic testing with her Ob/GYN.  History of 30 pack year smoking history (Q 2011). Has history of COPD from smoking, has been worse with recent allergies with wheezing, coughing. Denies fever or chills.  She is on adderall for ADD/depression which helps.  She has bipolar/depression/OCD tendencies- she was switched from lexapro/brintellix to luvox but has not been taking it, she states that this is not helping her. She  states that with her bladder cancer she is unable to concentrate, her body hurts all over, she will lay in bed and obsess over her pains and thinks she has cancer. She is on klonopin and lyrica for fibromyalgia and anxiety. Denies SI/HI.  She follows with Dr. Berenice Primas for her chronic shoulder pain, follows with Dr. Lynann Bologna for her back pain. She did follow up with Dr. Trudie Reed but had a negtiave RA work up. She is working on disability and is not on working at this time.  BMI is Body mass index is 35.44 kg/(m^2)., she is working on diet and exercise. Wt Readings from Last 3 Encounters:  07/05/14 213 lb (96.616 kg)  02/17/14 214 lb (97.07 kg)  01/14/14 215 lb (97.523 kg)     Current Medications:  Current Outpatient Prescriptions on File Prior to Visit  Medication Sig Dispense Refill  . amphetamine-dextroamphetamine (ADDERALL) 20 MG tablet Take 1 tablet (20 mg total) by mouth 2 (two) times daily. 60 tablet 0  . cholecalciferol (VITAMIN D) 1000 UNITS tablet Take 7,000 Units by mouth at bedtime.     . clonazePAM (KLONOPIN) 2 MG tablet Take 1 tablet (2 mg total) by mouth 2 (two) times daily. 60 tablet 0  . ferrous sulfate 325 (65 FE) MG tablet Take 325 mg by mouth at bedtime.    . fluvoxaMINE (LUVOX) 100 MG tablet  Take 1 tablet (100 mg total) by mouth at bedtime. 30 tablet 0  . levothyroxine (SYNTHROID, LEVOTHROID) 75 MCG tablet Take 1 tablet (75 mcg total) by mouth daily before breakfast. 90 tablet 0  . Magnesium 250 MG TABS Take 250 mg by mouth at bedtime.    Marland Kitchen oxybutynin (DITROPAN) 5 MG tablet Take 1 tablet (5 mg total) by mouth every 8 (eight) hours as needed for bladder spasms. 30 tablet 0  . oxyCODONE-acetaminophen (PERCOCET/ROXICET) 5-325 MG per tablet Take 1-2 tablets by mouth every 6 (six) hours as needed for severe pain. 40 tablet 0  . pregabalin (LYRICA) 75 MG capsule Take 1 capsule (75 mg total) by mouth 2 (two) times daily. 60 capsule 2   No current facility-administered medications on  file prior to visit.   Health Maintenance:   Immunization History  Administered Date(s) Administered  . Influenza Split 01/14/2014  . Pneumococcal Polysaccharide-23 09/02/2013  . Tdap 06/19/2012   Tetanus: 2015 Pneumovax: 2015 Prevnar 13: Flu vaccine: 01/2014 Zostavax: N/A Pap: 2015  MGM: 02/2014 DEXA: N/A- broke sacrum while falling, last menses 2006, former smoker.  Colonoscopy: 06/2011 7 polyps Dr. Deatra Ina EGD: N/A  Patient Care Team: Unk Pinto, MD as PCP - General (Internal Medicine) Ricke Hey, MD as Attending Physician (Internal Medicine) Festus Aloe, MD as Consulting Physician (Urology) Gavin Pound, MD as Consulting Physician (Rheumatology) Inda Castle, MD as Consulting Physician (Gastroenterology)   Allergies:  Allergies  Allergen Reactions  . Sulfa Antibiotics Hives and Itching   Medical History:  Past Medical History  Diagnosis Date  . Fibromyalgia   . PONV (postoperative nausea and vomiting)     used a scop patch last surg-did well  . Hyperlipidemia   . COPD (chronic obstructive pulmonary disease)   . Prediabetes   . Restless leg syndrome   . Hypertension     normal now, only took medications for 1 month- thinks it was from pain.  Marland Kitchen Hypothyroidism   . Hemorrhoid   . Anxiety   . Depression   . ADD (attention deficit disorder)   . Bladder neoplasm   . History of melanoma excision     leg  . Iron deficiency anemia   . Cancer 2015    Bladder cancer   Surgical History:  Past Surgical History  Procedure Laterality Date  . Ankle fusion Right 2011  . Knee arthroscopy with medial menisectomy Right 05/22/2012    Procedure: KNEE ARTHROSCOPY WITH MEDIAL MENISECTOMY and Medial Plica excision;  Surgeon: Alta Corning, MD;  Location: [place];  Service: Orthopedics;  Laterality: Right;  . Anterior cervical decomp/discectomy fusion N/A 09/01/2013    Procedure: Anterior cervical decompression fusion, cervical 5-6 with  instrumentation and allograft;  Surgeon: Sinclair Ship, MD;  Location: [place];  Service: Orthopedics;  Laterality: N/A;  Anterior cervical decompression fusion, cervical 5-6 with instrumentation and allograft  . Shoulder arthroscopy with subacromial decompression Left 11/10/2013    Procedure: SHOULDER ARTHROSCOPY WITH SUBACROMIAL DECOMPRESSION WITH ROTATOR CUFF REPAIR AND BICEPS TENODESIS;  Surgeon: Alta Corning, MD;  Location: [place];  Service: Orthopedics;  Laterality: Left;  . Closed reduction and pinning left fifth  proximal phalangeal fx  10-09-2005  . D & c hysteroscopy with novasure endometrial ablation  07-16-2006  . Shoulder arthroscopy w/ subacromial decompression and distal clavicle excision Left 11-27-2011    W/  DEBRIDEMENT  . Cataract extraction w/ intraocular lens implant Bilateral   . Tonsillectomy  age 51  .  Laparoscopic cholecystectomy  2005  . Tubal ligation  1980  . Transurethral resection of bladder tumor N/A 01/11/2014    Procedure: TRANSURETHRAL RESECTION OF BLADDER TUMOR (TURBT);  Surgeon: Festus Aloe, MD;  Location: [facility];  Service: Urology;  Laterality: N/A;  . Cystoscopy w/ retrogrades Left 01/11/2014    Procedure: CYSTOSCOPY WITH RETROGRADE PYELOGRAM;  Surgeon: Festus Aloe, MD;  Location: [facility];  Service: Urology;  Laterality: Left;  . Cystoscopy with stent placement Left 01/11/2014    Procedure: CYSTOSCOPY WITH STENT PLACEMENT;  Surgeon: Festus Aloe, MD;  Location: [facility];  Service: Urology;  Laterality: Left;   Family History:  Family History  Problem Relation Age of Onset  . Cancer Father     Lung and stomach   . Heart disease Father   . Hypertension Father   . Stroke Father   . Heart failure Paternal Grandfather   . Cancer Paternal Grandfather     colon cancer  . Mental retardation Son   . Ovarian cancer Mother 41  . Breast cancer Mother     dx in her late  45s-50s  . Thyroid cancer Mother   . Ovarian cancer Sister 13  . Cancer Brother     abdominal tumor  . Ovarian cancer Maternal Aunt   . Breast cancer Maternal Aunt   . Breast cancer Maternal Uncle   . Breast cancer Paternal Aunt   . Ovarian cancer Sister 1  . Ovarian cancer Sister 49  . Cancer Brother     abdominal tumor  . Breast cancer Paternal Aunt   . Breast cancer Paternal Aunt   . Breast cancer Paternal Aunt   . Breast cancer Cousin   . Breast cancer Cousin    Social History:  History  Substance Use Topics  . Smoking status: Former Smoker -- 1.00 packs/day for 30 years    Types: Cigarettes    Quit date: 11/25/2011  . Smokeless tobacco: Never Used  . Alcohol Use: No  Review of Systems  Constitutional: Negative.   HENT: Positive for congestion. Negative for ear discharge, ear pain, hearing loss, nosebleeds, sore throat and tinnitus.   Eyes: Negative.   Respiratory: Positive for cough and wheezing. Negative for hemoptysis, sputum production, shortness of breath and stridor.   Cardiovascular: Positive for leg swelling. Negative for chest pain, palpitations, orthopnea, claudication and PND.  Gastrointestinal: Positive for abdominal pain. Negative for heartburn, nausea, vomiting, diarrhea, constipation, blood in stool and melena.  Genitourinary: Negative.   Musculoskeletal: Positive for myalgias, back pain, joint pain and neck pain. Negative for falls.  Skin: Negative.   Neurological: Negative.  Negative for headaches.  Endo/Heme/Allergies: Negative.   Psychiatric/Behavioral: Positive for depression. Negative for suicidal ideas, hallucinations, memory loss and substance abuse. The patient is nervous/anxious and has insomnia.    Physical Exam: Estimated body mass index is 35.44 kg/(m^2) as calculated from the following:   Height as of this encounter: 5\' 5"  (1.651 m).   Weight as of this encounter: 213 lb (96.616 kg). BP 122/80 mmHg  Pulse 60  Temp(Src) 97.7 F (36.5 C)   Resp 16  Ht 5\' 5"  (1.651 m)  Wt 213 lb (96.616 kg)  BMI 35.44 kg/m2 General Appearance: Well nourished, in no apparent distress. Eyes: PERRLA, EOMs, conjunctiva no swelling or erythema, normal fundi and vessels. Sinuses: No Frontal/maxillary tenderness ENT/Mouth: Ext aud canals clear, normal light reflex with TMs without erythema, bulging.  Good dentition. No erythema, swelling, or exudate  on post pharynx. Tonsils not swollen or erythematous. Hearing normal.  Neck: Supple, thyroid normal. No bruits Respiratory: Respiratory effort normal, BS equal bilaterally, course expiratory breath sounds without rales, rhonchi, wheezing or stridor. RA O2 98% Cardio: RRR without murmurs, rubs or gallops. Brisk peripheral pulses.  Chest: symmetric, with normal excursions and percussion. Breasts: Symmetric, without lumps, nipple discharge, retractions. Abdomen: Soft, obese, +BS. Non tender, no guarding, rebound, hernias, masses, or organomegaly. .  Lymphatics: Non tender without lymphadenopathy.  Genitourinary: defer Musculoskeletal: Full ROM all peripheral extremities 4/5 strength, and antalgic gait with cane. Left ankle with swelling, sensation intact.  Skin: Warm, dry without rashes, lesions, ecchymosis.  Neuro: Cranial nerves intact, reflexes equal bilaterally. Normal muscle tone, no cerebellar symptoms.  Psych: Awake and oriented X 3, normal affect, Insight and Judgment appropriate.   EKG: WNL no changes.   Vicie Mutters 9:15 AM

## 2014-07-06 ENCOUNTER — Other Ambulatory Visit: Payer: Self-pay | Admitting: Physician Assistant

## 2014-07-06 LAB — LIPID PANEL
Cholesterol: 196 mg/dL (ref 0–200)
HDL: 40 mg/dL — ABNORMAL LOW (ref >=46)
LDL Cholesterol: 126 mg/dL — ABNORMAL HIGH (ref 0–99)
Total CHOL/HDL Ratio: 4.9 Ratio
Triglycerides: 152 mg/dL — ABNORMAL HIGH (ref <150)
VLDL: 30 mg/dL (ref 0–40)

## 2014-07-06 LAB — URINALYSIS, ROUTINE W REFLEX MICROSCOPIC
Bilirubin Urine: NEGATIVE
Glucose, UA: NEGATIVE mg/dL
Hgb urine dipstick: NEGATIVE
Ketones, ur: NEGATIVE mg/dL
Leukocytes, UA: NEGATIVE
Nitrite: NEGATIVE
Protein, ur: NEGATIVE mg/dL
Specific Gravity, Urine: <1.005 — ABNORMAL LOW (ref 1.005–1.030)
Urobilinogen, UA: 0.2 mg/dL (ref 0.0–1.0)
pH: 7 (ref 5.0–8.0)

## 2014-07-06 LAB — HEPATIC FUNCTION PANEL
ALT: 39 U/L — ABNORMAL HIGH (ref 0–35)
AST: 33 U/L (ref 0–37)
Albumin: 3.9 g/dL (ref 3.5–5.2)
Alkaline Phosphatase: 95 U/L (ref 39–117)
Bilirubin, Direct: 0.1 mg/dL (ref 0.0–0.3)
Indirect Bilirubin: 0.3 mg/dL (ref 0.2–1.2)
Total Bilirubin: 0.4 mg/dL (ref 0.2–1.2)
Total Protein: 6.5 g/dL (ref 6.0–8.3)

## 2014-07-06 LAB — BASIC METABOLIC PANEL WITH GFR
BUN: 14 mg/dL (ref 6–23)
CO2: 27 mEq/L (ref 19–32)
Calcium: 9.7 mg/dL (ref 8.4–10.5)
Chloride: 99 mEq/L (ref 96–112)
Creat: 0.59 mg/dL (ref 0.50–1.10)
GFR, Est African American: >89 mL/min
GFR, Est Non African American: >89 mL/min
Glucose, Bld: 85 mg/dL (ref 70–99)
Potassium: 4.4 mEq/L (ref 3.5–5.3)
Sodium: 139 mEq/L (ref 135–145)

## 2014-07-06 LAB — MICROALBUMIN / CREATININE URINE RATIO
Creatinine, Urine: 25.6 mg/dL
Microalb, Ur: <0.2 mg/dL (ref <2.0)

## 2014-07-06 LAB — INSULIN, FASTING: Insulin fasting, serum: 4.5 u[IU]/mL (ref 2.0–19.6)

## 2014-07-06 LAB — HEMOGLOBIN A1C
Hgb A1c MFr Bld: 6 % — ABNORMAL HIGH (ref <5.7)
Mean Plasma Glucose: 126 mg/dL — ABNORMAL HIGH (ref <117)

## 2014-07-06 LAB — VITAMIN D 25 HYDROXY (VIT D DEFICIENCY, FRACTURES): Vit D, 25-Hydroxy: 63 ng/mL (ref 30–100)

## 2014-07-06 LAB — MAGNESIUM: Magnesium: 1.9 mg/dL (ref 1.5–2.5)

## 2014-07-06 LAB — TSH: TSH: 1.923 u[IU]/mL (ref 0.350–4.500)

## 2014-07-06 MED ORDER — ALBUTEROL SULFATE 108 (90 BASE) MCG/ACT IN AEPB: 2.0000 | INHALATION_SPRAY | Freq: Four times a day (QID) | RESPIRATORY_TRACT | Status: DC | PRN

## 2014-07-11 NOTE — Progress Notes (Signed)
[facility] has order and scheduling, spoke with [place], 07-11-14

## 2014-07-15 ENCOUNTER — Ambulatory Visit (INDEPENDENT_AMBULATORY_CARE_PROVIDER_SITE_OTHER): Payer: BLUE CROSS/BLUE SHIELD | Admitting: Internal Medicine

## 2014-07-15 ENCOUNTER — Encounter: Payer: Self-pay | Admitting: Internal Medicine

## 2014-07-15 VITALS — BP 138/74 | HR 80 | Temp 97.7°F | Resp 16 | Ht 65.0 in | Wt 213.0 lb

## 2014-07-15 DIAGNOSIS — N6082 Other benign mammary dysplasias of left breast: Secondary | ICD-10-CM

## 2014-07-15 DIAGNOSIS — D485 Neoplasm of uncertain behavior of skin: Secondary | ICD-10-CM

## 2014-07-15 DIAGNOSIS — I10 Essential (primary) hypertension: Secondary | ICD-10-CM

## 2014-07-15 DIAGNOSIS — R1030 Lower abdominal pain, unspecified: Secondary | ICD-10-CM

## 2014-07-18 ENCOUNTER — Encounter: Payer: Self-pay | Admitting: Internal Medicine

## 2014-07-18 DIAGNOSIS — Z872 Personal history of diseases of the skin and subcutaneous tissue: Secondary | ICD-10-CM

## 2014-07-18 HISTORY — DX: Personal history of diseases of the skin and subcutaneous tissue: Z87.2

## 2014-07-18 NOTE — Progress Notes (Signed)
Subjective:    Patient ID: [name], female    DOB: [birth year], 54 y.o.   MRN: [id number]  HPI Very nice 54 yo MWF with hx/o HTN, HLD, COPD, MO/PreDM, Bladder Cancer , strong (+) FHx ovarian Ca in 2 sisters who presents with c/o low midline to left pelvic pain and also with c/o infected sebaceous cyst of the left upper chest. Medication Sig  . albuterol (VENTOLIN HFA) 108 (90 BASE) MCG/ACT inhaler Inhale 2 puffs into the lungs every 4 (four) hours as needed for wheezing or shortness of breath.  . Albuterol Sulfate (PROAIR RESPICLICK) 353 (90 BASE) MCG/ACT AEPB Inhale 2 Act into the lungs every 6 (six) hours as needed (for shortness of breath).  Marland Kitchen amphetamine-dextroamphetamine (ADDERALL) 20 MG tablet Take 1 tablet (20 mg total) by mouth 2 (two) times daily.  . benzonatate (TESSALON PERLES) 100 MG capsule Take 2 capsules (200 mg total) by mouth 3 (three) times daily as needed for cough (Max: 600mg  per day (6 capsules per day)).  . cholecalciferol (VITAMIN D) 1000 UNITS tablet Take 7,000 Units by mouth at bedtime.   . clonazePAM (KLONOPIN) 2 MG tablet Take 1 tablet (2 mg total) by mouth 2 (two) times daily.  . ferrous sulfate 325 (65 FE) MG tablet Take 325 mg by mouth at bedtime.  . fluvoxaMINE (LUVOX) 100 MG tablet TAKE ONE TABLET BY MOUTH AT BEDTIME  . gabapentin (NEURONTIN) 300 MG capsule Take 1 capsule (300 mg total) by mouth 3 (three) times daily.  Marland Kitchen levothyroxine (SYNTHROID, LEVOTHROID) 75 MCG tablet Take 1 tablet (75 mcg total) by mouth daily before breakfast.  . Magnesium 250 MG TABS Take 250 mg by mouth at bedtime.  . montelukast (SINGULAIR) 10 MG tablet Take 1 tablet (10 mg total) by mouth daily.  Marland Kitchen oxybutynin (DITROPAN) 5 MG tablet Take 1 tablet (5 mg total) by mouth every 8 (eight) hours as needed for bladder spasms.  Marland Kitchen oxyCODONE-acetaminophen (PERCOCET/ROXICET) 5-325 MG per tablet Take 1-2 tablets by mouth every 6 (six) hours as needed for severe pain.  . pregabalin (LYRICA) 75  MG capsule Take 1 capsule (75 mg total) by mouth 2 (two) times daily.   Allergies  Allergen Reactions  . Sulfa Antibiotics Hives and Itching   Past Medical History  Diagnosis Date  . Fibromyalgia   . PONV (postoperative nausea and vomiting)     used a scop patch last surg-did well  . Hyperlipidemia   . COPD (chronic obstructive pulmonary disease)   . Prediabetes   . Restless leg syndrome   . Hypertension     normal now, only took medications for 1 month- thinks it was from pain.  Marland Kitchen Hypothyroidism   . Hemorrhoid   . Anxiety   . Depression   . ADD (attention deficit disorder)   . Bladder neoplasm   . History of melanoma excision     leg  . Iron deficiency anemia   . Cancer 2015    Bladder cancer   Past Surgical History  Procedure Laterality Date  . Ankle fusion Right 2011  . Knee arthroscopy with medial menisectomy Right 05/22/2012    Procedure: KNEE ARTHROSCOPY WITH MEDIAL MENISECTOMY and Medial Plica excision;  Surgeon: Alta Corning, MD;  Location: [place];  Service: Orthopedics;  Laterality: Right;  . Anterior cervical decomp/discectomy fusion N/A 09/01/2013    Procedure: Anterior cervical decompression fusion, cervical 5-6 with instrumentation and allograft;  Surgeon: Sinclair Ship, MD;  Location: [place];  Service: Orthopedics;  Laterality: N/A;  Anterior cervical decompression fusion, cervical 5-6 with instrumentation and allograft  . Shoulder arthroscopy with subacromial decompression Left 11/10/2013    Procedure: SHOULDER ARTHROSCOPY WITH SUBACROMIAL DECOMPRESSION WITH ROTATOR CUFF REPAIR AND BICEPS TENODESIS;  Surgeon: Alta Corning, MD;  Location: [place];  Service: Orthopedics;  Laterality: Left;  . Closed reduction and pinning left fifth  proximal phalangeal fx  10-09-2005  . D & c hysteroscopy with novasure endometrial ablation  07-16-2006  . Shoulder arthroscopy w/ subacromial decompression and distal clavicle excision Left  11-27-2011    W/  DEBRIDEMENT  . Cataract extraction w/ intraocular lens implant Bilateral   . Tonsillectomy  age 35  . Laparoscopic cholecystectomy  2005  . Tubal ligation  1980  . Transurethral resection of bladder tumor N/A 01/11/2014    Procedure: TRANSURETHRAL RESECTION OF BLADDER TUMOR (TURBT);  Surgeon: Festus Aloe, MD;  Location: [facility];  Service: Urology;  Laterality: N/A;  . Cystoscopy w/ retrogrades Left 01/11/2014    Procedure: CYSTOSCOPY WITH RETROGRADE PYELOGRAM;  Surgeon: Festus Aloe, MD;  Location: [facility];  Service: Urology;  Laterality: Left;  . Cystoscopy with stent placement Left 01/11/2014    Procedure: CYSTOSCOPY WITH STENT PLACEMENT;  Surgeon: Festus Aloe, MD;  Location: [facility];  Service: Urology;  Laterality: Left;   Review of Systems 10 point systems review negative except as above.    Objective:   Physical Exam BP 138/74 mmHg  Pulse 80  Temp(Src) 97.7 F (36.5 C)  Resp 16  Ht 5\' 5"  (1.651 m)  Wt 213 lb (96.616 kg)  BMI 35.44 kg/m2  Obese - in no distress.  HEENT - Eac's patent. TM's Nl. EOM's full. PERRLA. NasoOroPharynx clear. Neck - supple. Nl Thyroid. Carotids 2+ & No bruits, nodes, JVD Chest - Clear equal BS w/o Rales, rhonchi, wheezes. Cor - Nl HS. RRR w/o sig MGR. PP 1(+). No edema. Abd - Soft & obese with tenderness over the low midline suprapubic area w/o guarding or RB. BS Nl. MS- FROM w/o deformities. Muscle power, tone and bulk Nl. Gait Nl. Neuro - No obvious Cr N abnormalities. Sensory, motor and Cerebellar functions appear Nl w/o focal abnormalities. Psyche - Mental status normal & appropriate.  No delusions, ideations or obvious mood abnormalities.  Skin there is a tender palpable SQ mass at about 11 o'clock at the para sternal margin of the left breast.  Procedure: After informed consent and aseptic prep and local anesthesia with 2 ml of Marcaine 0.5% w/epi , then a  12 mm transverse horizontal incision was made with a #10 scalpel. With sharp dissection and with traction a typical thick walled sebaceous cyst measuring ~12 x 16 mm was isolated and delivered intact. The wound was closed with #1 vertical mattress suture of 3-0 nylon and the wound was sealed with 'new skin'.     Assessment & Plan:   1. Essential hypertension   2. Sebaceous cyst of breast, left  - Excision   3. Suprapubic abdominal pain, unspecified laterality  - US Transvaginal Non-OB; Future - US Pelvis Complete; Future

## 2014-07-28 ENCOUNTER — Ambulatory Visit (HOSPITAL_COMMUNITY)
Admission: RE | Admit: 2014-07-28 | Discharge: 2014-07-28 | Disposition: A | Discharge status: Home / Self Care | Payer: BLUE CROSS/BLUE SHIELD | Source: Ambulatory Visit | Attending: Physician Assistant | Admitting: Physician Assistant

## 2014-07-28 ENCOUNTER — Ambulatory Visit (HOSPITAL_COMMUNITY)
Admission: RE | Admit: 2014-07-28 | Discharge: 2014-07-28 | Disposition: A | Discharge status: Home / Self Care | Payer: BLUE CROSS/BLUE SHIELD | Source: Ambulatory Visit | Attending: Internal Medicine | Admitting: Internal Medicine

## 2014-07-28 DIAGNOSIS — R0602 Shortness of breath: Secondary | ICD-10-CM | POA: Insufficient documentation

## 2014-07-28 DIAGNOSIS — D259 Leiomyoma of uterus, unspecified: Secondary | ICD-10-CM | POA: Insufficient documentation

## 2014-07-28 DIAGNOSIS — R062 Wheezing: Secondary | ICD-10-CM | POA: Diagnosis not present

## 2014-07-28 DIAGNOSIS — J449 Chronic obstructive pulmonary disease, unspecified: Secondary | ICD-10-CM | POA: Diagnosis not present

## 2014-07-28 DIAGNOSIS — R109 Unspecified abdominal pain: Secondary | ICD-10-CM | POA: Insufficient documentation

## 2014-07-28 DIAGNOSIS — N6082 Other benign mammary dysplasias of left breast: Secondary | ICD-10-CM

## 2014-07-28 DIAGNOSIS — R1030 Lower abdominal pain, unspecified: Secondary | ICD-10-CM

## 2014-07-29 ENCOUNTER — Other Ambulatory Visit: Payer: Self-pay | Admitting: Physician Assistant

## 2014-07-29 DIAGNOSIS — M797 Fibromyalgia: Secondary | ICD-10-CM

## 2014-07-29 DIAGNOSIS — F988 Other specified behavioral and emotional disorders with onset usually occurring in childhood and adolescence: Secondary | ICD-10-CM

## 2014-07-29 MED ORDER — AMPHETAMINE-DEXTROAMPHETAMINE 20 MG PO TABS: 20.0000 mg | ORAL_TABLET | Freq: Two times a day (BID) | ORAL | Status: DC

## 2014-08-01 ENCOUNTER — Other Ambulatory Visit: Payer: Self-pay

## 2014-08-01 DIAGNOSIS — M25559 Pain in unspecified hip: Secondary | ICD-10-CM

## 2014-08-01 DIAGNOSIS — D259 Leiomyoma of uterus, unspecified: Secondary | ICD-10-CM

## 2014-08-04 ENCOUNTER — Other Ambulatory Visit: Payer: Self-pay | Admitting: Obstetrics and Gynecology

## 2014-08-05 ENCOUNTER — Other Ambulatory Visit: Payer: Self-pay | Admitting: Physician Assistant

## 2014-08-05 DIAGNOSIS — J449 Chronic obstructive pulmonary disease, unspecified: Secondary | ICD-10-CM

## 2014-08-05 MED ORDER — BENZONATATE 100 MG PO CAPS: 200.0000 mg | ORAL_CAPSULE | Freq: Three times a day (TID) | ORAL | Status: DC | PRN

## 2014-08-05 NOTE — Addendum Note (Signed)
Addended by: Vicie Mutters R on: 08/05/2014 10:45 AM   Modules accepted: Orders

## 2014-08-08 ENCOUNTER — Other Ambulatory Visit: Payer: Self-pay | Admitting: Obstetrics and Gynecology

## 2014-08-08 DIAGNOSIS — Z803 Family history of malignant neoplasm of breast: Secondary | ICD-10-CM

## 2014-08-08 DIAGNOSIS — R922 Inconclusive mammogram: Secondary | ICD-10-CM

## 2014-08-08 DIAGNOSIS — R9230 Dense breasts, unspecified: Secondary | ICD-10-CM

## 2014-08-08 LAB — CYTOLOGY - PAP

## 2014-08-24 ENCOUNTER — Encounter (HOSPITAL_BASED_OUTPATIENT_CLINIC_OR_DEPARTMENT_OTHER): Payer: Self-pay | Admitting: *Deleted

## 2014-08-24 ENCOUNTER — Other Ambulatory Visit: Payer: Self-pay

## 2014-08-24 NOTE — Progress Notes (Signed)
Pt instructed npo pmn 5/22 x singulair, synthroid, klonopin, lyrica w sip of water.  To [facility] 5/23 @ 0600.  ekg , cxr in chart.  To [facility] Thurs or Friday before 2 pm for cbc.  Needs istat on arrival.  [facility] guidelines reviewed. Pt verbalized her understanding.

## 2014-08-24 NOTE — H&P (Signed)
[name]  DICTATION # [phone] CSN# [id number]   Margarette Asal, MD 08/24/2014 12:40 PM

## 2014-08-26 LAB — CBC
HCT: 44.5 % (ref 36.0–46.0)
Hemoglobin: 14.6 g/dL (ref 12.0–15.0)
MCH: 30.2 pg (ref 26.0–34.0)
MCHC: 32.8 g/dL (ref 30.0–36.0)
MCV: 92.1 fL (ref 78.0–100.0)
Platelets: 341 10*3/uL (ref 150–400)
RBC: 4.83 MIL/uL (ref 3.87–5.11)
RDW: 14.3 % (ref 11.5–15.5)
WBC: 19.1 10*3/uL — ABNORMAL HIGH (ref 4.0–10.5)

## 2014-08-27 NOTE — H&P (Signed)
NAMEMarland Padilla  AUNDREA, HORACE             ACCOUNT NO.:  [id number]  MEDICAL RECORD NO.:  [id number]  LOCATION:                                 FACILITY:  PHYSICIAN:  Ralene Bathe. Matthew Saras, M.D.    DATE OF BIRTH:  DATE OF ADMISSION:  08/29/2014 DATE OF DISCHARGE:                             HISTORY & PHYSICAL   CHIEF COMPLAINT:  Stress urinary incontinence, back pain, and lower abdominal pain, strong family history of ovarian cancer, BRCA negative.  HISTORY OF PRESENT ILLNESS:  This patient was seen in our office in April 2016.  I had actually seen her years ago for laser ablation of cervical dysplasia with subsequent normal Paps.  She has had evaluation by Dr. Junious Silk at [facility] both for her SUI and transurethral resection of early bladder cancer in September of 2015.  As noted above, she has 3 sisters who had ovarian cancer and also significant family history of breast cancer, although she herself was tested and is BRCA negative.  Presents now for LAVH-BSO along with midurethral sling. Review of the urodynamics by Dr. Junious Silk was consistent with SUI, she was brought back into my office for simple cystometric's last week, had no PVR, at a volume of 200 mL.  The catheter was removed.  She was asked to cough and had prompt leakage, which corrected with elevation of the UV angle.  This procedure including specific risks related to bleeding, infection, adjacent organ injury, transfusion, possible need to complete the surgery by open technique along with her expected recovery time reviewed.  The current information on the use of polypropylene mesh for midurethral sling was discussed with her.  Complications such as infection, over-correction that may require removal of the mesh or mesh erosion, and how we treat those problems reviewed with her also.  ALLERGIES:  SULFA.  SURGICAL HISTORY:  She had a tubal in 1983, ankle fusion in 2011, neck fusion in 2015, left shoulder surgery in  2014 and 2015, and transurethral resection of bladder cancer in 2015.  REVIEW OF SYSTEMS:  Significant for history of thyroid disease and bladder cancer as noted.  FAMILY HISTORY:  Otherwise remarkable for history of breast and ovarian cancer as stated above and a history of thyroid disease.  CURRENT MEDICATIONS:  Lyrica, Klonopin, magnesium supplements along with vitamin D, levothyroxine, and Adderall.  SOCIAL HISTORY:  Denies drug or tobacco use, she does drink alcohol socially.  Dr. Melford Aase is her medical doctor.  Last Pap in April 2016 was normal.  PHYSICAL EXAMINATION:  VITAL SIGNS:  Temperature 98.2, blood pressure 122/80. HEENT:  Unremarkable. NECK:  Supple without masses. LUNGS:  Clear. CARDIOVASCULAR:  Regular rate and rhythm without murmurs, rubs, or gallops noted. BREASTS:  Without masses. ABDOMEN:  Soft, flat, nontender. GU:  Vulva, vagina, and cervix normal.  Uterus mid position, mobile. Adnexae negative. EXTREMITIES:  Unremarkable. NEUROLOGIC:  Unremarkable.  IMPRESSION: 1. Stress urinary incontinence. 2. Strong family history of ovarian and breast cancer, the patient is     BRCA negative, pelvic discomfort as noted.  PLAN:  LAVH-BSO, Solyx midurethral sling.  Procedure and risks reviewed as above.     Deshan Hemmelgarn M. Matthew Saras, M.D.  RMH/MEDQ  D:  08/24/2014  T:  08/25/2014  Job:  [id number]

## 2014-08-28 NOTE — Anesthesia Preprocedure Evaluation (Addendum)
Anesthesia Evaluation  Patient identified by MRN, date of birth, ID band Patient awake    Reviewed: Allergy & Precautions, H&P , NPO status , Patient's Chart, lab work & pertinent test results  History of Anesthesia Complications (+) PONV and history of anesthetic complications  Airway Mallampati: II  TM Distance: >3 FB Neck ROM: Full    Dental  (+) Caps, Dental Advisory Given,  All upper front are capped:   Pulmonary shortness of breath and with exertion, COPD COPD inhaler, former smoker,  breath sounds clear to auscultation  Pulmonary exam normal       Cardiovascular hypertension, Pt. on medications negative cardio ROS Normal cardiovascular examRhythm:Regular Rate:Normal     Neuro/Psych PSYCHIATRIC DISORDERS Anxiety Depression Bipolar Disorder Cervical fusion negative neurological ROS     GI/Hepatic Neg liver ROS, GERD-  Medicated and Controlled,  Endo/Other  Hypothyroidism prediabetes  Renal/GU negative Renal ROS     Musculoskeletal  (+) Fibromyalgia -, narcotic dependent  Abdominal (+) + obese,   Peds  Hematology negative hematology ROS (+) anemia ,   Anesthesia Other Findings   Reproductive/Obstetrics negative OB ROS                           Anesthesia Physical Anesthesia Plan  ASA: III  Anesthesia Plan: General   Post-op Pain Management:    Induction: Intravenous  Airway Management Planned: Oral ETT  Additional Equipment:   Intra-op Plan:   Post-operative Plan: Extubation in OR  Informed Consent:   Plan Discussed with: Surgeon  Anesthesia Plan Comments:         Anesthesia Quick Evaluation

## 2014-08-29 ENCOUNTER — Inpatient Hospital Stay (HOSPITAL_BASED_OUTPATIENT_CLINIC_OR_DEPARTMENT_OTHER)
Admission: AD | Admit: 2014-08-29 | Discharge: 2014-09-06 | DRG: 742 | Disposition: A | Discharge status: Home / Self Care | Payer: BLUE CROSS/BLUE SHIELD | Source: Ambulatory Visit | Attending: Obstetrics and Gynecology | Admitting: Obstetrics and Gynecology

## 2014-08-29 ENCOUNTER — Ambulatory Visit (HOSPITAL_BASED_OUTPATIENT_CLINIC_OR_DEPARTMENT_OTHER): Payer: BLUE CROSS/BLUE SHIELD | Admitting: Anesthesiology

## 2014-08-29 ENCOUNTER — Encounter (HOSPITAL_BASED_OUTPATIENT_CLINIC_OR_DEPARTMENT_OTHER): Payer: Self-pay | Admitting: *Deleted

## 2014-08-29 ENCOUNTER — Ambulatory Visit (HOSPITAL_COMMUNITY): Payer: BLUE CROSS/BLUE SHIELD | Admitting: Anesthesiology

## 2014-08-29 ENCOUNTER — Other Ambulatory Visit: Payer: Self-pay

## 2014-08-29 ENCOUNTER — Encounter (HOSPITAL_COMMUNITY)
Admission: AD | Disposition: A | Discharge status: Home / Self Care | Payer: Self-pay | Source: Ambulatory Visit | Attending: Obstetrics and Gynecology

## 2014-08-29 DIAGNOSIS — I10 Essential (primary) hypertension: Secondary | ICD-10-CM | POA: Diagnosis present

## 2014-08-29 DIAGNOSIS — D259 Leiomyoma of uterus, unspecified: Principal | ICD-10-CM | POA: Diagnosis present

## 2014-08-29 DIAGNOSIS — R000 Tachycardia, unspecified: Secondary | ICD-10-CM | POA: Diagnosis not present

## 2014-08-29 DIAGNOSIS — J449 Chronic obstructive pulmonary disease, unspecified: Secondary | ICD-10-CM | POA: Diagnosis present

## 2014-08-29 DIAGNOSIS — E669 Obesity, unspecified: Secondary | ICD-10-CM | POA: Diagnosis present

## 2014-08-29 DIAGNOSIS — M797 Fibromyalgia: Secondary | ICD-10-CM | POA: Diagnosis present

## 2014-08-29 DIAGNOSIS — N393 Stress incontinence (female) (male): Secondary | ICD-10-CM | POA: Diagnosis present

## 2014-08-29 DIAGNOSIS — Z8249 Family history of ischemic heart disease and other diseases of the circulatory system: Secondary | ICD-10-CM

## 2014-08-29 DIAGNOSIS — Z803 Family history of malignant neoplasm of breast: Secondary | ICD-10-CM

## 2014-08-29 DIAGNOSIS — Z882 Allergy status to sulfonamides status: Secondary | ICD-10-CM

## 2014-08-29 DIAGNOSIS — Z85820 Personal history of malignant melanoma of skin: Secondary | ICD-10-CM

## 2014-08-29 DIAGNOSIS — Z8041 Family history of malignant neoplasm of ovary: Secondary | ICD-10-CM | POA: Diagnosis not present

## 2014-08-29 DIAGNOSIS — Z79899 Other long term (current) drug therapy: Secondary | ICD-10-CM

## 2014-08-29 DIAGNOSIS — Z981 Arthrodesis status: Secondary | ICD-10-CM | POA: Diagnosis not present

## 2014-08-29 DIAGNOSIS — K219 Gastro-esophageal reflux disease without esophagitis: Secondary | ICD-10-CM | POA: Diagnosis present

## 2014-08-29 DIAGNOSIS — K567 Ileus, unspecified: Secondary | ICD-10-CM | POA: Diagnosis not present

## 2014-08-29 DIAGNOSIS — Z8543 Personal history of malignant neoplasm of ovary: Secondary | ICD-10-CM

## 2014-08-29 DIAGNOSIS — Z823 Family history of stroke: Secondary | ICD-10-CM

## 2014-08-29 DIAGNOSIS — Z808 Family history of malignant neoplasm of other organs or systems: Secondary | ICD-10-CM

## 2014-08-29 DIAGNOSIS — G2581 Restless legs syndrome: Secondary | ICD-10-CM | POA: Diagnosis present

## 2014-08-29 DIAGNOSIS — Z800 Family history of malignant neoplasm of digestive organs: Secondary | ICD-10-CM | POA: Diagnosis not present

## 2014-08-29 DIAGNOSIS — K9189 Other postprocedural complications and disorders of digestive system: Secondary | ICD-10-CM

## 2014-08-29 DIAGNOSIS — I9789 Other postprocedural complications and disorders of the circulatory system, not elsewhere classified: Secondary | ICD-10-CM | POA: Diagnosis not present

## 2014-08-29 DIAGNOSIS — E039 Hypothyroidism, unspecified: Secondary | ICD-10-CM | POA: Diagnosis present

## 2014-08-29 DIAGNOSIS — I9581 Postprocedural hypotension: Secondary | ICD-10-CM | POA: Diagnosis not present

## 2014-08-29 DIAGNOSIS — Z8551 Personal history of malignant neoplasm of bladder: Secondary | ICD-10-CM | POA: Diagnosis not present

## 2014-08-29 DIAGNOSIS — N99820 Postprocedural hemorrhage and hematoma of a genitourinary system organ or structure following a genitourinary system procedure: Secondary | ICD-10-CM | POA: Diagnosis not present

## 2014-08-29 DIAGNOSIS — Y836 Removal of other organ (partial) (total) as the cause of abnormal reaction of the patient, or of later complication, without mention of misadventure at the time of the procedure: Secondary | ICD-10-CM | POA: Diagnosis not present

## 2014-08-29 DIAGNOSIS — Z6836 Body mass index (BMI) 36.0-36.9, adult: Secondary | ICD-10-CM

## 2014-08-29 DIAGNOSIS — E785 Hyperlipidemia, unspecified: Secondary | ICD-10-CM | POA: Diagnosis present

## 2014-08-29 HISTORY — DX: Personal history of diseases of the skin and subcutaneous tissue: Z87.2

## 2014-08-29 HISTORY — PX: SALPINGOOPHORECTOMY: SHX82

## 2014-08-29 HISTORY — PX: PUBOVAGINAL SLING: SHX1035

## 2014-08-29 HISTORY — PX: LAPAROSCOPIC ASSISTED VAGINAL HYSTERECTOMY: SHX5398

## 2014-08-29 HISTORY — DX: Reserved for inherently not codable concepts without codable children: IMO0001

## 2014-08-29 HISTORY — DX: Presence of spectacles and contact lenses: Z97.3

## 2014-08-29 HISTORY — PX: LAPAROTOMY: SHX154

## 2014-08-29 HISTORY — DX: Personal history of other diseases of the respiratory system: Z87.09

## 2014-08-29 HISTORY — DX: Benign neoplasm of connective and other soft tissue, unspecified: D21.9

## 2014-08-29 HISTORY — DX: Stress incontinence (female) (male): N39.3

## 2014-08-29 LAB — HEMOGLOBIN AND HEMATOCRIT, BLOOD
HCT: 28.3 % — ABNORMAL LOW (ref 36.0–46.0)
Hemoglobin: 9.3 g/dL — ABNORMAL LOW (ref 12.0–15.0)

## 2014-08-29 LAB — CBC
HCT: 31 % — ABNORMAL LOW (ref 36.0–46.0)
HCT: 29.1 % — ABNORMAL LOW (ref 36.0–46.0)
HCT: 27.4 % — ABNORMAL LOW (ref 36.0–46.0)
Hemoglobin: 10.1 g/dL — ABNORMAL LOW (ref 12.0–15.0)
Hemoglobin: 9.4 g/dL — ABNORMAL LOW (ref 12.0–15.0)
Hemoglobin: 9.1 g/dL — ABNORMAL LOW (ref 12.0–15.0)
MCH: 30.1 pg (ref 26.0–34.0)
MCH: 30 pg (ref 26.0–34.0)
MCH: 29.5 pg (ref 26.0–34.0)
MCHC: 32.6 g/dL (ref 30.0–36.0)
MCHC: 32.3 g/dL (ref 30.0–36.0)
MCHC: 33.2 g/dL (ref 30.0–36.0)
MCV: 92.5 fL (ref 78.0–100.0)
MCV: 93 fL (ref 78.0–100.0)
MCV: 89 fL (ref 78.0–100.0)
Platelets: 306 K/uL (ref 150–400)
Platelets: 302 10*3/uL (ref 150–400)
Platelets: 174 10*3/uL (ref 150–400)
RBC: 3.35 MIL/uL — ABNORMAL LOW (ref 3.87–5.11)
RBC: 3.13 MIL/uL — ABNORMAL LOW (ref 3.87–5.11)
RBC: 3.08 MIL/uL — ABNORMAL LOW (ref 3.87–5.11)
RDW: 14.4 % (ref 11.5–15.5)
RDW: 14.4 % (ref 11.5–15.5)
RDW: 15.1 % (ref 11.5–15.5)
WBC: 32.7 K/uL — ABNORMAL HIGH (ref 4.0–10.5)
WBC: 32 10*3/uL — ABNORMAL HIGH (ref 4.0–10.5)
WBC: 31 10*3/uL — ABNORMAL HIGH (ref 4.0–10.5)

## 2014-08-29 LAB — POCT I-STAT 4, (NA,K, GLUC, HGB,HCT)
Glucose, Bld: 100 mg/dL — ABNORMAL HIGH (ref 65–99)
HCT: 43 % (ref 36.0–46.0)
Hemoglobin: 14.6 g/dL (ref 12.0–15.0)
Potassium: 3.9 mmol/L (ref 3.5–5.1)
Sodium: 138 mmol/L (ref 135–145)

## 2014-08-29 LAB — GLUCOSE, CAPILLARY: Glucose-Capillary: 158 mg/dL — ABNORMAL HIGH (ref 65–99)

## 2014-08-29 LAB — ABO/RH: ABO/RH(D): A POS

## 2014-08-29 LAB — PREPARE RBC (CROSSMATCH)

## 2014-08-29 SURGERY — HYSTERECTOMY, VAGINAL, LAPAROSCOPY-ASSISTED
Anesthesia: General | Site: Vagina

## 2014-08-29 SURGERY — LAPAROTOMY, EXPLORATORY
Anesthesia: General | Site: Abdomen

## 2014-08-29 MED ORDER — SODIUM CHLORIDE 0.9 % IV SOLN
Freq: Once | INTRAVENOUS | Status: AC
  Administered 2014-08-29: 16:00:00 via INTRAVENOUS
  Filled 2014-08-29: qty 1000

## 2014-08-29 MED ORDER — ROCURONIUM BROMIDE 100 MG/10ML IV SOLN
INTRAVENOUS | Status: AC
  Filled 2014-08-29: qty 1

## 2014-08-29 MED ORDER — OXYCODONE HCL 5 MG PO TABS
5.0000 mg | ORAL_TABLET | Freq: Once | ORAL | Status: DC | PRN
  Filled 2014-08-29: qty 1

## 2014-08-29 MED ORDER — ALBUMIN HUMAN 5 % IV SOLN
12.5000 g | Freq: Once | INTRAVENOUS | Status: AC
  Administered 2014-08-29: 12.5 g via INTRAVENOUS
  Filled 2014-08-29: qty 250

## 2014-08-29 MED ORDER — OXYCODONE-ACETAMINOPHEN 5-325 MG PO TABS
1.0000 | ORAL_TABLET | ORAL | Status: DC | PRN
  Administered 2014-08-31: 2 via ORAL

## 2014-08-29 MED ORDER — DEXTROSE IN LACTATED RINGERS 5 % IV SOLN
INTRAVENOUS | Status: DC
  Administered 2014-08-29 – 2014-09-01 (×6): via INTRAVENOUS

## 2014-08-29 MED ORDER — OXYCODONE HCL 5 MG PO TABS: 5.0000 mg | ORAL_TABLET | Freq: Once | ORAL | Status: DC | PRN

## 2014-08-29 MED ORDER — HYDROMORPHONE HCL 1 MG/ML IJ SOLN
0.2500 mg | INTRAMUSCULAR | Status: DC | PRN
  Filled 2014-08-29: qty 1

## 2014-08-29 MED ORDER — ALBUTEROL SULFATE HFA 108 (90 BASE) MCG/ACT IN AERS
2.0000 | INHALATION_SPRAY | RESPIRATORY_TRACT | Status: DC | PRN
  Filled 2014-08-29: qty 6.7

## 2014-08-29 MED ORDER — GLYCOPYRROLATE 0.2 MG/ML IJ SOLN
INTRAMUSCULAR | Status: DC | PRN
  Administered 2014-08-29: 0.4 mg via INTRAVENOUS

## 2014-08-29 MED ORDER — PROPOFOL 10 MG/ML IV BOLUS
INTRAVENOUS | Status: DC | PRN
  Administered 2014-08-29: 80 mg via INTRAVENOUS

## 2014-08-29 MED ORDER — BUPIVACAINE LIPOSOME 1.3 % IJ SUSP
20.0000 mL | Freq: Once | INTRAMUSCULAR | Status: AC
  Administered 2014-08-29: 20 mL
  Filled 2014-08-29: qty 20

## 2014-08-29 MED ORDER — MIDAZOLAM HCL 5 MG/5ML IJ SOLN
INTRAMUSCULAR | Status: DC | PRN
  Administered 2014-08-29: 2 mg via INTRAVENOUS

## 2014-08-29 MED ORDER — MORPHINE SULFATE 4 MG/ML IJ SOLN
INTRAMUSCULAR | Status: AC
  Filled 2014-08-29: qty 1

## 2014-08-29 MED ORDER — ACETAMINOPHEN 10 MG/ML IV SOLN
INTRAVENOUS | Status: DC | PRN
  Administered 2014-08-29: 1000 mg via INTRAVENOUS

## 2014-08-29 MED ORDER — LACTATED RINGERS IV SOLN
INTRAVENOUS | Status: DC
  Administered 2014-08-29 (×2): via INTRAVENOUS
  Filled 2014-08-29: qty 1000

## 2014-08-29 MED ORDER — MENTHOL 3 MG MT LOZG
1.0000 | LOZENGE | OROMUCOSAL | Status: DC | PRN
  Filled 2014-08-29: qty 9

## 2014-08-29 MED ORDER — FENTANYL CITRATE (PF) 100 MCG/2ML IJ SOLN
INTRAMUSCULAR | Status: DC | PRN
  Administered 2014-08-29 (×8): 50 ug via INTRAVENOUS

## 2014-08-29 MED ORDER — HYDROMORPHONE HCL 1 MG/ML IJ SOLN
INTRAMUSCULAR | Status: AC
  Filled 2014-08-29: qty 1

## 2014-08-29 MED ORDER — DIPHENHYDRAMINE HCL 50 MG/ML IJ SOLN
12.5000 mg | Freq: Four times a day (QID) | INTRAMUSCULAR | Status: DC | PRN
  Filled 2014-08-29: qty 0.25

## 2014-08-29 MED ORDER — LIDOCAINE HCL (CARDIAC) 20 MG/ML IV SOLN
INTRAVENOUS | Status: AC
  Filled 2014-08-29: qty 5

## 2014-08-29 MED ORDER — DEXAMETHASONE SODIUM PHOSPHATE 4 MG/ML IJ SOLN
INTRAMUSCULAR | Status: DC | PRN
  Administered 2014-08-29: 10 mg via INTRAVENOUS

## 2014-08-29 MED ORDER — EPHEDRINE SULFATE 50 MG/ML IJ SOLN
INTRAMUSCULAR | Status: DC | PRN
  Administered 2014-08-29: 5 mg via INTRAVENOUS
  Administered 2014-08-29: 10 mg via INTRAVENOUS

## 2014-08-29 MED ORDER — ONDANSETRON HCL 4 MG PO TABS
4.0000 mg | ORAL_TABLET | Freq: Four times a day (QID) | ORAL | Status: DC | PRN
  Administered 2014-09-02: 4 mg via ORAL
  Filled 2014-08-29 (×2): qty 1

## 2014-08-29 MED ORDER — BUPIVACAINE HCL 0.25 % IJ SOLN
INTRAMUSCULAR | Status: DC | PRN
  Administered 2014-08-29: 10 mL

## 2014-08-29 MED ORDER — LIDOCAINE HCL (CARDIAC) 20 MG/ML IV SOLN
INTRAVENOUS | Status: DC | PRN
  Administered 2014-08-29: 80 mg via INTRAVENOUS

## 2014-08-29 MED ORDER — ONDANSETRON HCL 4 MG/2ML IJ SOLN
INTRAMUSCULAR | Status: AC
  Filled 2014-08-29: qty 2

## 2014-08-29 MED ORDER — BUTORPHANOL TARTRATE 1 MG/ML IJ SOLN
1.0000 mg | INTRAMUSCULAR | Status: DC | PRN
  Filled 2014-08-29: qty 2

## 2014-08-29 MED ORDER — LACTATED RINGERS IV SOLN
INTRAVENOUS | Status: DC
  Administered 2014-08-29 (×2): via INTRAVENOUS
  Filled 2014-08-29: qty 1000

## 2014-08-29 MED ORDER — MIDAZOLAM HCL 2 MG/2ML IJ SOLN
INTRAMUSCULAR | Status: AC
  Filled 2014-08-29: qty 2

## 2014-08-29 MED ORDER — SODIUM CHLORIDE 0.9 % IJ SOLN
INTRAMUSCULAR | Status: AC
  Filled 2014-08-29: qty 20

## 2014-08-29 MED ORDER — FENTANYL CITRATE (PF) 100 MCG/2ML IJ SOLN
INTRAMUSCULAR | Status: AC
  Filled 2014-08-29: qty 2

## 2014-08-29 MED ORDER — SCOPOLAMINE 1 MG/3DAYS TD PT72
1.0000 | MEDICATED_PATCH | TRANSDERMAL | Status: DC
  Administered 2014-08-29: 1.5 mg via TRANSDERMAL
  Filled 2014-08-29: qty 1

## 2014-08-29 MED ORDER — DIPHENHYDRAMINE HCL 12.5 MG/5ML PO ELIX
12.5000 mg | ORAL_SOLUTION | Freq: Four times a day (QID) | ORAL | Status: DC | PRN
  Filled 2014-08-29: qty 5

## 2014-08-29 MED ORDER — GLYCOPYRROLATE 0.2 MG/ML IJ SOLN
INTRAMUSCULAR | Status: DC | PRN
  Administered 2014-08-29: 0.6 mg via INTRAVENOUS

## 2014-08-29 MED ORDER — 0.9 % SODIUM CHLORIDE (POUR BTL) OPTIME
TOPICAL | Status: DC | PRN
  Administered 2014-08-29: 3000 mL

## 2014-08-29 MED ORDER — LIDOCAINE HCL (PF) 2 % IJ SOLN
INTRAMUSCULAR | Status: DC | PRN
  Administered 2014-08-29: 30 mg via INTRADERMAL

## 2014-08-29 MED ORDER — OXYCODONE HCL 5 MG/5ML PO SOLN
5.0000 mg | Freq: Once | ORAL | Status: DC | PRN
  Filled 2014-08-29: qty 5

## 2014-08-29 MED ORDER — HYDROMORPHONE HCL 1 MG/ML IJ SOLN
0.2500 mg | INTRAMUSCULAR | Status: DC | PRN
  Administered 2014-08-29 (×3): 0.25 mg via INTRAVENOUS
  Administered 2014-08-29 (×2): 0.5 mg via INTRAVENOUS
  Administered 2014-08-29: 0.25 mg via INTRAVENOUS
  Filled 2014-08-29: qty 1

## 2014-08-29 MED ORDER — DEXTROSE IN LACTATED RINGERS 5 % IV SOLN
INTRAVENOUS | Status: DC
Start: 2014-08-29 — End: 2014-09-06
  Administered 2014-08-31 (×2): via INTRAVENOUS
  Filled 2014-08-29: qty 1000

## 2014-08-29 MED ORDER — ROCURONIUM BROMIDE 100 MG/10ML IV SOLN
INTRAVENOUS | Status: DC | PRN
  Administered 2014-08-29: 30 mg via INTRAVENOUS
  Administered 2014-08-29 (×3): 10 mg via INTRAVENOUS

## 2014-08-29 MED ORDER — GABAPENTIN 300 MG PO CAPS
300.0000 mg | ORAL_CAPSULE | Freq: Three times a day (TID) | ORAL | Status: DC
  Administered 2014-08-29 – 2014-08-31 (×5): 300 mg via ORAL
  Filled 2014-08-29 (×11): qty 1

## 2014-08-29 MED ORDER — LACTATED RINGERS IV SOLN
INTRAVENOUS | Status: DC
  Administered 2014-08-29 (×3): via INTRAVENOUS
  Filled 2014-08-29: qty 1000

## 2014-08-29 MED ORDER — STERILE WATER FOR IRRIGATION IR SOLN
Status: DC | PRN
Start: 2014-08-29 — End: 2014-08-29
  Administered 2014-08-29: 500 mL

## 2014-08-29 MED ORDER — ONDANSETRON HCL 4 MG/2ML IJ SOLN
4.0000 mg | Freq: Four times a day (QID) | INTRAMUSCULAR | Status: DC | PRN
  Filled 2014-08-29: qty 2

## 2014-08-29 MED ORDER — CLONAZEPAM 1 MG PO TABS
2.0000 mg | ORAL_TABLET | Freq: Two times a day (BID) | ORAL | Status: DC
  Administered 2014-08-29 – 2014-08-30 (×3): 2 mg via ORAL
  Filled 2014-08-29 (×3): qty 2
  Filled 2014-08-29: qty 1

## 2014-08-29 MED ORDER — NEOSTIGMINE METHYLSULFATE 10 MG/10ML IV SOLN
INTRAVENOUS | Status: DC | PRN
  Administered 2014-08-29: 3 mg via INTRAVENOUS

## 2014-08-29 MED ORDER — DEXAMETHASONE SODIUM PHOSPHATE 10 MG/ML IJ SOLN
INTRAMUSCULAR | Status: DC | PRN
  Administered 2014-08-29: 10 mg via INTRAVENOUS

## 2014-08-29 MED ORDER — PROMETHAZINE HCL 25 MG/ML IJ SOLN
6.2500 mg | INTRAMUSCULAR | Status: DC | PRN
  Filled 2014-08-29: qty 1

## 2014-08-29 MED ORDER — FLUVOXAMINE MALEATE 100 MG PO TABS
100.0000 mg | ORAL_TABLET | Freq: Every day | ORAL | Status: DC
  Administered 2014-08-29 – 2014-09-01 (×4): 100 mg via ORAL
  Filled 2014-08-29 (×10): qty 1

## 2014-08-29 MED ORDER — DEXTROSE 5 % IV SOLN
2.0000 g | INTRAVENOUS | Status: AC
  Administered 2014-08-29: 2 g via INTRAVENOUS
  Filled 2014-08-29: qty 2

## 2014-08-29 MED ORDER — KETOROLAC TROMETHAMINE 30 MG/ML IJ SOLN
30.0000 mg | Freq: Four times a day (QID) | INTRAMUSCULAR | Status: DC
  Administered 2014-08-30 – 2014-08-31 (×6): 30 mg via INTRAVENOUS
  Filled 2014-08-29 (×13): qty 1

## 2014-08-29 MED ORDER — ONDANSETRON HCL 4 MG PO TABS
4.0000 mg | ORAL_TABLET | Freq: Four times a day (QID) | ORAL | Status: DC | PRN
  Filled 2014-08-29: qty 1

## 2014-08-29 MED ORDER — HYDROMORPHONE HCL 1 MG/ML IJ SOLN
0.2500 mg | INTRAMUSCULAR | Status: DC | PRN
  Administered 2014-08-29 (×4): 0.5 mg via INTRAVENOUS

## 2014-08-29 MED ORDER — LEVOTHYROXINE SODIUM 75 MCG PO TABS
75.0000 ug | ORAL_TABLET | Freq: Every day | ORAL | Status: DC
  Administered 2014-08-30 – 2014-09-03 (×6): 75 ug via ORAL
  Filled 2014-08-29 (×11): qty 1

## 2014-08-29 MED ORDER — MORPHINE SULFATE (PF) 1 MG/ML IV SOLN
INTRAVENOUS | Status: DC
  Administered 2014-08-29 (×2): via INTRAVENOUS
  Administered 2014-08-30: 5.61 mg via INTRAVENOUS
  Administered 2014-08-31: 01:00:00 via INTRAVENOUS
  Administered 2014-08-31: 6 mg via INTRAVENOUS
  Administered 2014-08-31: 0 mg via INTRAVENOUS
  Filled 2014-08-29 (×5): qty 25

## 2014-08-29 MED ORDER — CEFOTETAN DISODIUM-DEXTROSE 2-2.08 GM-% IV SOLR
INTRAVENOUS | Status: AC
  Filled 2014-08-29: qty 50

## 2014-08-29 MED ORDER — HYDROMORPHONE HCL 1 MG/ML IJ SOLN: 0.2500 mg | INTRAMUSCULAR | Status: DC | PRN

## 2014-08-29 MED ORDER — MORPHINE SULFATE 2 MG/ML IJ SOLN
1.0000 mg | Freq: Once | INTRAMUSCULAR | Status: DC
  Filled 2014-08-29: qty 1

## 2014-08-29 MED ORDER — KETOROLAC TROMETHAMINE 30 MG/ML IJ SOLN: 30.0000 mg | Freq: Once | INTRAMUSCULAR | Status: DC

## 2014-08-29 MED ORDER — ALBUTEROL SULFATE (2.5 MG/3ML) 0.083% IN NEBU
2.5000 mg | INHALATION_SOLUTION | RESPIRATORY_TRACT | Status: DC | PRN
  Administered 2014-09-05: 2.5 mg via RESPIRATORY_TRACT
  Filled 2014-08-29: qty 3

## 2014-08-29 MED ORDER — PHENYLEPHRINE HCL 10 MG/ML IJ SOLN
INTRAMUSCULAR | Status: AC
  Filled 2014-08-29: qty 1

## 2014-08-29 MED ORDER — SODIUM CHLORIDE 0.9 % IJ SOLN
9.0000 mL | INTRAMUSCULAR | Status: DC | PRN
  Filled 2014-08-29: qty 9

## 2014-08-29 MED ORDER — FENTANYL CITRATE (PF) 250 MCG/5ML IJ SOLN
INTRAMUSCULAR | Status: AC
  Filled 2014-08-29: qty 5

## 2014-08-29 MED ORDER — DEXTROSE 5 % IV SOLN: 2.0000 g | INTRAVENOUS | Status: DC | PRN

## 2014-08-29 MED ORDER — FENTANYL CITRATE (PF) 100 MCG/2ML IJ SOLN
INTRAMUSCULAR | Status: AC
  Filled 2014-08-29: qty 6

## 2014-08-29 MED ORDER — FENTANYL CITRATE (PF) 100 MCG/2ML IJ SOLN
INTRAMUSCULAR | Status: DC | PRN
Start: 2014-08-29 — End: 2014-08-29
  Administered 2014-08-29: 50 ug via INTRAVENOUS
  Administered 2014-08-29: 100 ug via INTRAVENOUS
  Administered 2014-08-29: 50 ug via INTRAVENOUS

## 2014-08-29 MED ORDER — MORPHINE SULFATE (PF) 1 MG/ML IV SOLN
INTRAVENOUS | Status: DC
  Filled 2014-08-29: qty 25

## 2014-08-29 MED ORDER — DEXTROSE 5 % IV SOLN
2.0000 g | INTRAVENOUS | Status: DC | PRN
  Administered 2014-08-29: 2 g via INTRAVENOUS

## 2014-08-29 MED ORDER — KETOROLAC TROMETHAMINE 30 MG/ML IJ SOLN
30.0000 mg | Freq: Four times a day (QID) | INTRAMUSCULAR | Status: DC
  Filled 2014-08-29: qty 1

## 2014-08-29 MED ORDER — OXYCODONE-ACETAMINOPHEN 5-325 MG PO TABS
1.0000 | ORAL_TABLET | ORAL | Status: DC | PRN
  Filled 2014-08-29: qty 1
  Filled 2014-08-29 (×2): qty 2

## 2014-08-29 MED ORDER — PROPOFOL 10 MG/ML IV BOLUS
INTRAVENOUS | Status: AC
  Filled 2014-08-29: qty 20

## 2014-08-29 MED ORDER — DEXAMETHASONE SODIUM PHOSPHATE 10 MG/ML IJ SOLN
INTRAMUSCULAR | Status: AC
  Filled 2014-08-29: qty 1

## 2014-08-29 MED ORDER — SUCCINYLCHOLINE CHLORIDE 20 MG/ML IJ SOLN
INTRAMUSCULAR | Status: DC | PRN
  Administered 2014-08-29: 100 mg via INTRAVENOUS

## 2014-08-29 MED ORDER — SODIUM CHLORIDE 0.9 % IJ SOLN
INTRAMUSCULAR | Status: AC
  Filled 2014-08-29: qty 10

## 2014-08-29 MED ORDER — IBUPROFEN 800 MG PO TABS
800.0000 mg | ORAL_TABLET | Freq: Three times a day (TID) | ORAL | Status: DC | PRN
  Administered 2014-08-31 – 2014-09-06 (×6): 800 mg via ORAL
  Filled 2014-08-29 (×7): qty 1

## 2014-08-29 MED ORDER — PROPOFOL 10 MG/ML IV BOLUS
INTRAVENOUS | Status: DC | PRN
  Administered 2014-08-29: 200 mg via INTRAVENOUS

## 2014-08-29 MED ORDER — NALOXONE HCL 0.4 MG/ML IJ SOLN
0.4000 mg | INTRAMUSCULAR | Status: DC | PRN
  Filled 2014-08-29: qty 1

## 2014-08-29 MED ORDER — PROMETHAZINE HCL 25 MG/ML IJ SOLN: 6.2500 mg | INTRAMUSCULAR | Status: DC | PRN

## 2014-08-29 MED ORDER — AMPHETAMINE-DEXTROAMPHETAMINE 10 MG PO TABS
20.0000 mg | ORAL_TABLET | Freq: Two times a day (BID) | ORAL | Status: DC
  Administered 2014-08-30: 20 mg via ORAL
  Filled 2014-08-29 (×4): qty 2
  Filled 2014-08-29: qty 1
  Filled 2014-08-29 (×4): qty 2

## 2014-08-29 MED ORDER — FLUORESCEIN SODIUM 10 % IJ SOLN
INTRAMUSCULAR | Status: DC | PRN
  Administered 2014-08-29: 500 mg via INTRAVENOUS

## 2014-08-29 MED ORDER — SUCCINYLCHOLINE CHLORIDE 20 MG/ML IJ SOLN
INTRAMUSCULAR | Status: DC | PRN
  Administered 2014-08-29: 120 mg via INTRAVENOUS

## 2014-08-29 MED ORDER — KETOROLAC TROMETHAMINE 30 MG/ML IJ SOLN
INTRAMUSCULAR | Status: DC | PRN
  Administered 2014-08-29: 30 mg via INTRAVENOUS

## 2014-08-29 MED ORDER — SCOPOLAMINE 1 MG/3DAYS TD PT72
MEDICATED_PATCH | TRANSDERMAL | Status: AC
  Filled 2014-08-29: qty 1

## 2014-08-29 MED ORDER — MONTELUKAST SODIUM 10 MG PO TABS
10.0000 mg | ORAL_TABLET | Freq: Every day | ORAL | Status: DC
  Administered 2014-08-31 – 2014-09-06 (×5): 10 mg via ORAL
  Filled 2014-08-29 (×9): qty 1

## 2014-08-29 MED ORDER — PREGABALIN 75 MG PO CAPS
75.0000 mg | ORAL_CAPSULE | Freq: Two times a day (BID) | ORAL | Status: DC
  Administered 2014-08-29 – 2014-08-31 (×3): 75 mg via ORAL
  Filled 2014-08-29 (×5): qty 1

## 2014-08-29 MED ORDER — PHENYLEPHRINE HCL 10 MG/ML IJ SOLN
10.0000 mg | INTRAVENOUS | Status: DC | PRN
  Administered 2014-08-29: 50 ug/min via INTRAVENOUS

## 2014-08-29 MED ORDER — ONDANSETRON HCL 4 MG/2ML IJ SOLN
4.0000 mg | Freq: Four times a day (QID) | INTRAMUSCULAR | Status: DC | PRN
  Administered 2014-09-01 – 2014-09-05 (×13): 4 mg via INTRAVENOUS
  Filled 2014-08-29 (×13): qty 2

## 2014-08-29 MED ORDER — OXYBUTYNIN CHLORIDE 5 MG PO TABS
5.0000 mg | ORAL_TABLET | Freq: Three times a day (TID) | ORAL | Status: DC | PRN
  Filled 2014-08-29: qty 1

## 2014-08-29 MED ORDER — KETOROLAC TROMETHAMINE 30 MG/ML IJ SOLN
30.0000 mg | Freq: Once | INTRAMUSCULAR | Status: DC
  Filled 2014-08-29: qty 1

## 2014-08-29 MED ORDER — LIDOCAINE-EPINEPHRINE (PF) 1 %-1:200000 IJ SOLN
INTRAMUSCULAR | Status: DC | PRN
  Administered 2014-08-29: 20 mL

## 2014-08-29 MED ORDER — ROCURONIUM BROMIDE 100 MG/10ML IV SOLN
INTRAVENOUS | Status: DC | PRN
  Administered 2014-08-29: 10 mg via INTRAVENOUS
  Administered 2014-08-29: 20 mg via INTRAVENOUS

## 2014-08-29 SURGICAL SUPPLY — 88 items
BAG URINE DRAINAGE (UROLOGICAL SUPPLIES) ×4 IMPLANT
BLADE SURG 10 STRL SS (BLADE) ×4 IMPLANT
BLADE SURG 11 STRL SS (BLADE) ×4 IMPLANT
BLADE SURG 15 STRL LF DISP TIS (BLADE) ×3 IMPLANT
BLADE SURG 15 STRL SS (BLADE) ×4
CANISTER SUCT 3000ML (MISCELLANEOUS) ×4 IMPLANT
CATH FOLEY 2WAY SLVR  5CC 14FR (CATHETERS) ×1
CATH FOLEY 2WAY SLVR 5CC 14FR (CATHETERS) ×3 IMPLANT
CATH ROBINSON RED A/P 16FR (CATHETERS) ×4 IMPLANT
CLEANER CAUTERY TIP 5X5 PAD (MISCELLANEOUS) ×3 IMPLANT
COVER BACK TABLE 60X90IN (DRAPES) ×8 IMPLANT
COVER MAYO STAND STRL (DRAPES) ×8 IMPLANT
DECANTER SPIKE VIAL GLASS SM (MISCELLANEOUS) ×4 IMPLANT
DRAPE LG THREE QUARTER DISP (DRAPES) ×8 IMPLANT
DRAPE UNDERBUTTOCKS STRL (DRAPE) ×4 IMPLANT
DRSG OPSITE POSTOP 3X4 (GAUZE/BANDAGES/DRESSINGS) IMPLANT
DRSG TEGADERM 2-3/8X2-3/4 SM (GAUZE/BANDAGES/DRESSINGS) ×1 IMPLANT
ELECT LIGASURE LONG (ELECTRODE) ×1 IMPLANT
ELECT LIGASURE SHORT 9 REUSE (ELECTRODE) ×4 IMPLANT
ELECT REM PT RETURN 9FT ADLT (ELECTROSURGICAL) ×4
ELECTRODE REM PT RTRN 9FT ADLT (ELECTROSURGICAL) ×3 IMPLANT
GLOVE BIO SURGEON STRL SZ 6.5 (GLOVE) ×5 IMPLANT
GLOVE BIO SURGEON STRL SZ7 (GLOVE) ×12 IMPLANT
GLOVE BIO SURGEON STRL SZ7.5 (GLOVE) ×1 IMPLANT
GLOVE INDICATOR 6.5 STRL GRN (GLOVE) ×1 IMPLANT
GLOVE INDICATOR 7.5 STRL GRN (GLOVE) ×1 IMPLANT
GLOVE SURG SS PI 7.5 STRL IVOR (GLOVE) ×2 IMPLANT
GOWN STRL REUS W/ TWL LRG LVL3 (GOWN DISPOSABLE) ×12 IMPLANT
GOWN STRL REUS W/TWL LRG LVL3 (GOWN DISPOSABLE) ×16
HOLDER FOLEY CATH W/STRAP (MISCELLANEOUS) ×4 IMPLANT
LEGGING LITHOTOMY PAIR STRL (DRAPES) ×4 IMPLANT
LIQUID BAND (GAUZE/BANDAGES/DRESSINGS) ×7 IMPLANT
NDL HYPO 25X1 1.5 SAFETY (NEEDLE) ×3 IMPLANT
NDL INSUFFLATION 14GA 120MM (NEEDLE) ×3 IMPLANT
NDL INSUFFLATION 14GA 150MM (NEEDLE) IMPLANT
NDL SPNL 22GX3.5 QUINCKE BK (NEEDLE) IMPLANT
NEEDLE HYPO 25X1 1.5 SAFETY (NEEDLE) ×4 IMPLANT
NEEDLE INSUFFLATION 14GA 120MM (NEEDLE) ×4 IMPLANT
NEEDLE INSUFFLATION 14GA 150MM (NEEDLE) ×4 IMPLANT
NEEDLE SPNL 22GX3.5 QUINCKE BK (NEEDLE) IMPLANT
NS IRRIG 500ML POUR BTL (IV SOLUTION) ×4 IMPLANT
PACK BASIN DAY SURGERY FS (CUSTOM PROCEDURE TRAY) ×4 IMPLANT
PACKING VAGINAL (PACKING) ×1 IMPLANT
PAD CLEANER CAUTERY TIP 5X5 (MISCELLANEOUS) ×1
PAD OB MATERNITY 4.3X12.25 (PERSONAL CARE ITEMS) ×4 IMPLANT
PADDING ION DISPOSABLE (MISCELLANEOUS) ×4 IMPLANT
PENCIL BUTTON HOLSTER BLD 10FT (ELECTRODE) ×4 IMPLANT
PLUG CATH AND CAP STER (CATHETERS) IMPLANT
SEALER TISSUE G2 CVD JAW 45CM (ENDOMECHANICALS) ×4 IMPLANT
SET IRRIG TUBING LAPAROSCOPIC (IRRIGATION / IRRIGATOR) IMPLANT
SHEET LAVH (DRAPES) ×4 IMPLANT
SLING SOLYX SYSTEM SIS EA (Sling) ×1 IMPLANT
SOLUTION ANTI FOG 6CC (MISCELLANEOUS) ×4 IMPLANT
SOLUTION ELECTROLUBE (MISCELLANEOUS) IMPLANT
SPONGE GAUZE 2X2 8PLY STRL LF (GAUZE/BANDAGES/DRESSINGS) ×1 IMPLANT
SPONGE GAUZE 4X4 12PLY STER LF (GAUZE/BANDAGES/DRESSINGS) IMPLANT
SPONGE LAP 4X18 X RAY DECT (DISPOSABLE) ×4 IMPLANT
STRIP CLOSURE SKIN 1/2X4 (GAUZE/BANDAGES/DRESSINGS) ×4 IMPLANT
STRIP CLOSURE SKIN 1/4X3 (GAUZE/BANDAGES/DRESSINGS) IMPLANT
SUT MON AB 2-0 CT1 36 (SUTURE) ×9 IMPLANT
SUT MON AB 2-0 SH 27 (SUTURE) ×4
SUT MON AB 2-0 SH27 (SUTURE) ×3 IMPLANT
SUT VIC AB 0 CT1 18XCR BRD8 (SUTURE) ×9 IMPLANT
SUT VIC AB 0 CT1 36 (SUTURE) IMPLANT
SUT VIC AB 0 CT1 8-18 (SUTURE)
SUT VIC AB 2-0 CT1 27 (SUTURE)
SUT VIC AB 2-0 CT1 TAPERPNT 27 (SUTURE) IMPLANT
SUT VIC AB 2-0 SH 27 (SUTURE) ×4
SUT VIC AB 2-0 SH 27XBRD (SUTURE) ×3 IMPLANT
SUT VIC AB 2-0 UR6 27 (SUTURE) ×8 IMPLANT
SUT VICRYL 0 TIES 12 18 (SUTURE) ×4 IMPLANT
SUT VICRYL 0 UR6 27IN ABS (SUTURE) IMPLANT
SUT VICRYL 4-0 PS2 18IN ABS (SUTURE) ×3 IMPLANT
SYR BULB IRRIGATION 50ML (SYRINGE) ×4 IMPLANT
SYR CONTROL 10ML LL (SYRINGE) ×4 IMPLANT
SYRINGE 10CC LL (SYRINGE) ×8 IMPLANT
TOWEL OR 17X24 6PK STRL BLUE (TOWEL DISPOSABLE) ×8 IMPLANT
TRAY DSU PREP LF (CUSTOM PROCEDURE TRAY) ×4 IMPLANT
TROCAR 5M 150ML BLDLS (TROCAR) ×1 IMPLANT
TROCAR DILATING TIP 12MM 150MM (ENDOMECHANICALS) ×1 IMPLANT
TROCAR OPTI TIP 5M 100M (ENDOMECHANICALS) ×4 IMPLANT
TROCAR XCEL DIL TIP R 11M (ENDOMECHANICALS) ×4 IMPLANT
TUBE CONNECTING 12X1/4 (SUCTIONS) ×8 IMPLANT
TUBING INSUFFLATION 10FT LAP (TUBING) ×4 IMPLANT
WARMER LAPAROSCOPE (MISCELLANEOUS) ×4 IMPLANT
WATER STERILE IRR 3000ML UROMA (IV SOLUTION) ×4 IMPLANT
WATER STERILE IRR 500ML POUR (IV SOLUTION) ×4 IMPLANT
YANKAUER SUCT BULB TIP NO VENT (SUCTIONS) ×4 IMPLANT

## 2014-08-29 SURGICAL SUPPLY — 25 items
BLADE EXTENDED COATED 6.5IN (ELECTRODE) ×1 IMPLANT
DRAPE LAPAROSCOPIC ABDOMINAL (DRAPES) ×1 IMPLANT
DRAPE LG THREE QUARTER DISP (DRAPES) ×2 IMPLANT
DRSG OPSITE POSTOP 4X8 (GAUZE/BANDAGES/DRESSINGS) ×1 IMPLANT
DRSG TEGADERM 2-3/8X2-3/4 SM (GAUZE/BANDAGES/DRESSINGS) ×1 IMPLANT
GAUZE SPONGE 2X2 8PLY STRL LF (GAUZE/BANDAGES/DRESSINGS) IMPLANT
GAUZE SPONGE 4X4 16PLY XRAY LF (GAUZE/BANDAGES/DRESSINGS) ×1 IMPLANT
GLOVE BIO SURGEON STRL SZ7 (GLOVE) ×2 IMPLANT
GOWN STRL REUS W/TWL XL LVL3 (GOWN DISPOSABLE) ×4 IMPLANT
KIT BASIN OR (CUSTOM PROCEDURE TRAY) ×1 IMPLANT
PACK GENERAL/GYN (CUSTOM PROCEDURE TRAY) ×1 IMPLANT
SPONGE GAUZE 2X2 STER 10/PKG (GAUZE/BANDAGES/DRESSINGS) ×1
SPONGE LAP 18X18 X RAY DECT (DISPOSABLE) ×3 IMPLANT
SUCTION POOLE TIP (SUCTIONS) ×1 IMPLANT
SUT MNCRL AB 4-0 PS2 18 (SUTURE) ×1 IMPLANT
SUT PDS AB 0 CT1 36 (SUTURE) ×2 IMPLANT
SUT VIC AB 2-0 CT1 18 (SUTURE) ×1 IMPLANT
SUT VIC AB 3-0 CT1 27 (SUTURE) ×2
SUT VIC AB 3-0 CT1 TAPERPNT 27 (SUTURE) IMPLANT
SUT VIC AB 3-0 CTX 36 (SUTURE) ×2 IMPLANT
SUT VICRYL 2 0 18  UND BR (SUTURE) ×1
SUT VICRYL 2 0 18 UND BR (SUTURE) IMPLANT
SYRINGE 20CC LL (MISCELLANEOUS) ×1 IMPLANT
TOWEL OR 17X26 10 PK STRL BLUE (TOWEL DISPOSABLE) ×1 IMPLANT
TOWEL OR NON WOVEN STRL DISP B (DISPOSABLE) ×2 IMPLANT

## 2014-08-29 NOTE — Transfer of Care (Signed)
Immediate Anesthesia Transfer of Care Note  Patient: [name]  Procedure(s) Performed: Procedure(s): EXPLORATORY LAPAROTOMY WITH LIGATION OF BLEEDER (N/A)  Patient Location: PACU  Anesthesia Type:General  Level of Consciousness:  sedated, patient cooperative and responds to stimulation  Airway & Oxygen Therapy:Patient Spontanous Breathing and Patient connected to face mask oxgen  Post-op Assessment:  Report given to PACU RN and Post -op Vital signs reviewed and stable  Post vital signs:  Reviewed and stable  Last Vitals:  Filed Vitals:   08/29/14 1627  BP:   Pulse: 106  Temp:   Resp: 19    Complications: No apparent anesthesia complications

## 2014-08-29 NOTE — Op Note (Signed)
Preoperative diagnosis: Pelvic pain, strong family history of ovarian cancer, stress urinary incontinence  Postoperative diagnosis: Same  Procedure: LAVH, BSO, Solix midureteral sling, cystoscopy  Surgeon: Matthew Saras  Asst.: Julien Girt  Anesthesia: Gen.  EBL: 300 cc  Procedure and findings:  The patient taken the operating room after an adequate level of general anesthesia was obtained the patient's legs in stirrups the abdomen perineum and vagina were prepped and draped in the usual fashion. EUA was carried out the uterus was midposition, mobile, adnexa negative. Appropriate timeouts taken at that point and the bladder was drained.  Attention directed to the abdomen the subumbilical area was infiltrated with quarter percent Marcaine plain, small incision was made in the varies needle was introduced. Even with the longer varies needle difficulty reaching the peritoneum. Decision made to proceed with open laparoscopy. [employer] retractors were used to identify the fascia which was incised and extended for a short. The peritoneum was identified entered sharply without difficulty explore with the surgeon's finger which was noted to be clear the sleeve of the trocar was then placed intraperitoneal, pneumoperitoneum was then easily created. The patient in Trendelenburg the uterus anteflexed the. Pelvic findings revealed a small uterine fibroid otherwise normal pelvic findings. A 5 mm trocar was inserted 3 finger breaths above the symphysis and inserted under direct visualization. Atraumatic grasping forcep was then used to grasp the distal right tube which is placed on traction toward the midline, the Enseal device was then used to coagulate and divide the right IP ligament close to the tube and ovary down to and including the round ligament on that side with excellent hemostasis. Course of the ureter was inspected noted be well below. On the opposite side the same was repeated. Once this was finished the  vaginal portion the procedure started  Legs were extended weighted speculum was positioned cervix grasped with a tenaculum cervical vaginal mucosa was incised with the Bovie posterior culdotomy performed without difficulty. The bladder was advanced superiorly with sharp and blunt dissection until the anterior peritoneal reflection could be identified this was entered sharply and a retractor then used to gently elevate the bladder out of field. Using the LigaSure device the uterosacral ligament, cardinal ligament uterine vasculature and upper broad ligament pedicles were coagulated and divided. The fundus of the uterus was then delivered posteriorly remaining pedicles were clamped first free tie followed by suture ligature of Vicryl. Thus the uterus both tubes and ovaries were removed and sent to pathology. Vaginal clot cuff was closed from 3 to 9:00 with a running locked 2-0 Vicryl suture. Prior to closure sponge, needle, history counts reported as correct 2. Vaginal cuff was then closed right to left with interrupted 2-0 Vicryl sutures. Attention directed to the mid urethral sling the bladder was drained, Allis clamps were then used to grasp the mid urethral mucosa this was injected with 1% Xylocaine with epi. A vertical incision was made Allis clamps used to retract the mucosa and with Metzenbaum scissors and the surgeon's fingertip the mid urethral area was opened until the posterior portion of the interpubic ramus could be palpated on each side. The Solix sling was then positioned first on the right to the midway point at a 45 angle behind the inferior portion of the pubic ramus. This was released reloaded and similarly at a 45 angle posterior to the pubic ramus on the left was inserted was not released immediately until appropriate tensioning was noted, once this was satisfactory this was released. Previously she been given  for seen of the cystoscopy was carried out revealing the be the bladder intact, both  ureteral jets could be noted. Cystoscopy was completed bladder was drained with a Foley catheter. Mucosa closed with interrupted 2-0 Vicryl sutures. This was hemostatic.  At this point repeat laparoscopy was carried out the Nezhat suction irrigator was used to first irrigate and suction under reduced pressure to relook at the operative site which was hemostatic. Inserts removed, gas allowed to escape, the fascia closed with a 2-0 Vicryl on a UR. The skin at the umbilicus and closed with 4-0 Monocryl subcuticular Dermabond on the lower incision. She tolerated this well went to recovery room in good condition.  Dictated with AubreyD.

## 2014-08-29 NOTE — Consult Note (Signed)
Called for acute hypotension and tachycardia at approximately 14:15. Arrived at pt's bedside at approximately 1417. HR 116. BP 82/56. Pt s/p Lap assisted hysterectomy with 300cc's blood loss documented in OR. EKG Sinus tachycardia.  Post op CBC Hbg 10.4 down from 14. Abdomen soft but mildly distended.  Albumin x 2 bottles of 250cc's ordered. Type and cross sent. Additional 18g PIV placed in left leg saphenous vein. Spoke with surgeon directly. Plan to continue with IVF and re-check CBC in 1 hr. BP  94/55, HR 105. Will continue to monitor.  Rodman Comp, MD

## 2014-08-29 NOTE — Progress Notes (Signed)
Morphine PCA wasted. None given. 25 ml total wasted into medical device. Witnessed by Romie Jumper, RN.

## 2014-08-29 NOTE — Addendum Note (Signed)
Addendum  created 08/29/14 1516 by Suzette Battiest, MD   Modules edited: Clinical Notes   Clinical Notes:  File: [id number]; Pend: [id number]

## 2014-08-29 NOTE — Anesthesia Postprocedure Evaluation (Signed)
  Anesthesia Post-op Note  Patient: [name]  Procedure(s) Performed: Procedure(s) (LRB): LAPAROSCOPIC ASSISTED VAGINAL HYSTERECTOMY (N/A) SALPINGO OOPHORECTOMY (Bilateral) SOLYX SINGLE INCISION SLING (N/A)  Patient Location: PACU  Anesthesia Type: General  Level of Consciousness: awake and alert   Airway and Oxygen Therapy: Patient Spontanous Breathing  Post-op Pain: mild  Post-op Assessment: Post-op Vital signs reviewed, Patient's Cardiovascular Status Stable, Respiratory Function Stable, Patent Airway and No signs of Nausea or vomiting  Last Vitals:  Filed Vitals:   08/29/14 1100  BP: 90/50  Pulse: 96  Temp:   Resp: 10    Post-op Vital Signs: stable   Complications: No apparent anesthesia complications

## 2014-08-29 NOTE — Anesthesia Procedure Notes (Signed)
Procedure Name: Intubation Date/Time: 08/29/2014 4:35 PM Performed by: Lajuana Carry E Pre-anesthesia Checklist: Patient identified, Emergency Drugs available, Suction available and Patient being monitored Patient Re-evaluated:Patient Re-evaluated prior to inductionOxygen Delivery Method: Circle System Utilized Preoxygenation: Pre-oxygenation with 100% oxygen Intubation Type: IV induction Ventilation: Mask ventilation without difficulty Laryngoscope Size: Miller and 3 Grade View: Grade I Tube type: Oral Tube size: 7.0 mm Number of attempts: 1 Airway Equipment and Method: Stylet Placement Confirmation: ETT inserted through vocal cords under direct vision,  positive ETCO2 and breath sounds checked- equal and bilateral Secured at: 21 cm Tube secured with: Tape Dental Injury: Teeth and Oropharynx as per pre-operative assessment

## 2014-08-29 NOTE — Op Note (Signed)
Preoperative diagnosis:Postoperative intra-abdominal bleeding  Postoperative diagnosis: Same  Procedure:420 laparotomy with suturing of vaginal cuff bleeder  Surgeon: Matthew Saras  Anesthesia; Gen.  Blood products received, 2 unit transfusionintraoperatively Drains: Foley catheter   EBL: 2000 2500 cc an old blood in the  Abdomen. Intraoperative hemoglobin was 6.5 during the first unit of transfusion..  Procedure and findings  This patient underwent LAVH BSO with mid urethral sling this morning. In PACU, she had some hemodynamic instability hemoglobin preop was 14 at 2 PM was10.4, one hour later was 9 but she was complaining more of abdominal pain decision made to reexplore at that point.  Patient taken the operating room was prepped and draped after appropriate timeouts were taken. The Foley catheter was arty positioned draining clear urine. Penicillus is was made 2 finger breaths above the symphysis carried out of the fascia which was incised and extended transversely. Rectus muscle divided in the midline, peritoneum entered superiorly and extended in a vertical fashion. Large amount of old blood clot was evacuated. O'Connor-O'Sullivan retractor was positioned after removing clot andblood withasump sucker, bowel Speck superiorly out of the field and bladder blade plus upper blades were positioned for better visualization.The pelvis was irrigated briefly and then packed, to assist identification the vaginal cuff was sponge stick was placed in the vagina the cyst and using gentle elevation can identify thevaginal cuff suture line from left sidewall down through vaginal cuff on the left side was fine looking from the right side down fairly quickly I could see a active arterial pumper from just above the right angle of the cuff.. A right angle clamp was used to grasp this this was then tied and then oversew with an 0 Vicryl suture. The parapelvic pelvis is irrigated with saline and was observed over time I  did not see any further bleeding from any sites and felt that this was the area the active bleeder. Prior to closure sponge, needle, history precast reported as correct 2.   Rectus muscles reapproximated midline with 3-0 Vicryl suture. 0 PDS was then used from laterally to midline on either side to close the fascia subcutaneous tissue was irrigated noted be hemostatic was reapproximated with a 3-0 Vicrylsuture. Diluted Exparel was injected into the subcutaneous space and incision under the skin for postoperative analgesia. Closed with a 4-0 Monocryl subcuticular closure. She received 2 units intraoperatively and was stable went to recovery room in good condition  Dictated with [employer]  StrausstownD.

## 2014-08-29 NOTE — Anesthesia Procedure Notes (Signed)
Procedure Name: Intubation Date/Time: 08/29/2014 7:42 AM Performed by: Denna Haggard D Pre-anesthesia Checklist: Patient identified, Emergency Drugs available, Suction available and Patient being monitored Patient Re-evaluated:Patient Re-evaluated prior to inductionOxygen Delivery Method: Circle System Utilized Preoxygenation: Pre-oxygenation with 100% oxygen Intubation Type: IV induction Ventilation: Mask ventilation without difficulty Laryngoscope Size: Mac and 3 Grade View: Grade I Tube type: Oral Tube size: 7.0 mm Number of attempts: 1 Airway Equipment and Method: Stylet and Oral airway Placement Confirmation: ETT inserted through vocal cords under direct vision,  positive ETCO2 and breath sounds checked- equal and bilateral Secured at: 22 cm Tube secured with: Tape Dental Injury: Teeth and Oropharynx as per pre-operative assessment

## 2014-08-29 NOTE — Progress Notes (Signed)
The patient was re-examined with no change in status 

## 2014-08-29 NOTE — Transfer of Care (Signed)
Immediate Anesthesia Transfer of Care Note  Patient: [name]  Procedure(s) Performed: Procedure(s) (LRB): LAPAROSCOPIC ASSISTED VAGINAL HYSTERECTOMY (N/A) SALPINGO OOPHORECTOMY (Bilateral) SOLYX SINGLE INCISION SLING (N/A)  Patient Location: PACU  Anesthesia Type: General  Level of Consciousness: awake, oriented, sedated and patient cooperative  Airway & Oxygen Therapy: Patient Spontanous Breathing and Patient connected to face mask oxygen  Post-op Assessment: Report given to PACU RN and Post -op Vital signs reviewed and stable  Post vital signs: Reviewed and stable  Complications: No apparent anesthesia complications

## 2014-08-29 NOTE — Anesthesia Preprocedure Evaluation (Signed)
Anesthesia Evaluation  Patient identified by MRN, date of birth, ID band Patient awake    Reviewed: Allergy & Precautions, H&P , NPO status , Patient's Chart, lab work & pertinent test results  History of Anesthesia Complications (+) PONV and history of anesthetic complications  Airway Mallampati: II  TM Distance: >3 FB Neck ROM: Full    Dental  (+) Caps, Dental Advisory Given,  All upper front are capped:   Pulmonary shortness of breath and with exertion, COPD COPD inhaler, former smoker,  breath sounds clear to auscultation  Pulmonary exam normal       Cardiovascular hypertension, Pt. on medications negative cardio ROS Normal cardiovascular examRhythm:Regular Rate:Normal     Neuro/Psych PSYCHIATRIC DISORDERS Anxiety Depression Bipolar Disorder Cervical fusion    GI/Hepatic Neg liver ROS, GERD-  Medicated and Controlled,  Endo/Other  Hypothyroidism prediabetes  Renal/GU negative Renal ROS     Musculoskeletal  (+) Fibromyalgia -, narcotic dependent  Abdominal (+) + obese,   Peds  Hematology  (+) anemia , Acute blood loss anemia.   Anesthesia Other Findings   Reproductive/Obstetrics negative OB ROS                             Lab Results  Component Value Date   WBC 32.0* 08/29/2014   HGB 9.4* 08/29/2014   HCT 29.1* 08/29/2014   MCV 93.0 08/29/2014   PLT 302 08/29/2014   Lab Results  Component Value Date   CREATININE 0.59 07/05/2014   BUN 14 07/05/2014   NA 138 08/29/2014   K 3.9 08/29/2014   CL 99 07/05/2014   CO2 27 07/05/2014    Anesthesia Physical  Anesthesia Plan  ASA: IV and emergent  Anesthesia Plan: General   Post-op Pain Management:    Induction: Intravenous  Airway Management Planned: Oral ETT  Additional Equipment:   Intra-op Plan:   Post-operative Plan: Extubation in OR  Informed Consent: I have reviewed the patients History and Physical, chart,  labs and discussed the procedure including the risks, benefits and alternatives for the proposed anesthesia with the patient or authorized representative who has indicated his/her understanding and acceptance.   Dental advisory given  Plan Discussed with: CRNA  Anesthesia Plan Comments:         Anesthesia Quick Evaluation

## 2014-08-30 ENCOUNTER — Encounter (HOSPITAL_BASED_OUTPATIENT_CLINIC_OR_DEPARTMENT_OTHER): Payer: Self-pay | Admitting: Obstetrics and Gynecology

## 2014-08-30 LAB — CBC
HCT: 23.9 % — ABNORMAL LOW (ref 36.0–46.0)
Hemoglobin: 7.8 g/dL — ABNORMAL LOW (ref 12.0–15.0)
MCH: 28.9 pg (ref 26.0–34.0)
MCHC: 32.6 g/dL (ref 30.0–36.0)
MCV: 88.5 fL (ref 78.0–100.0)
Platelets: 149 10*3/uL — ABNORMAL LOW (ref 150–400)
RBC: 2.7 MIL/uL — ABNORMAL LOW (ref 3.87–5.11)
RDW: 15.7 % — ABNORMAL HIGH (ref 11.5–15.5)
WBC: 29.5 10*3/uL — ABNORMAL HIGH (ref 4.0–10.5)

## 2014-08-30 LAB — POCT I-STAT 4, (NA,K, GLUC, HGB,HCT)
Glucose, Bld: 172 mg/dL — ABNORMAL HIGH (ref 65–99)
HCT: 19 % — ABNORMAL LOW (ref 36.0–46.0)
Hemoglobin: 6.5 g/dL — CL (ref 12.0–15.0)
Potassium: 4.8 mmol/L (ref 3.5–5.1)
Sodium: 138 mmol/L (ref 135–145)

## 2014-08-30 NOTE — Progress Notes (Signed)
POS 1  S/  Resting, comfortable O//BP 136/54 mmHg  Pulse 95  Temp(Src) 97.6 F (36.4 C) (Oral)  Resp 20  Ht 5\' 4"  (1.626 m)  Wt 213 lb (96.616 kg)  BMI 36.54 kg/m2  SpO2 98% CBC    Component Value Date/Time   WBC 29.5* 08/30/2014 0525   RBC 2.70* 08/30/2014 0525   HGB 7.8* 08/30/2014 0525   HCT 23.9* 08/30/2014 0525   PLT 149* 08/30/2014 0525   MCV 88.5 08/30/2014 0525   MCH 28.9 08/30/2014 0525   MCHC 32.6 08/30/2014 0525   RDW 15.7* 08/30/2014 0525   LYMPHSABS 3.9 07/05/2014 1042   MONOABS 0.7 07/05/2014 1042   EOSABS 0.2 07/05/2014 1042   BASOSABS 0.0 07/05/2014 1042    abd soft + BS, dressing dry, bruising @ umbilicus  A+P// stable, incr diet, voiding trial today, CBC in am, ambulate

## 2014-08-30 NOTE — Progress Notes (Signed)
Called dr Matthew Saras to notifying pt has voided and bladder was scanned afterwards. Left message to call back

## 2014-08-31 LAB — CBC
HCT: 18.9 % — ABNORMAL LOW (ref 36.0–46.0)
Hemoglobin: 6.3 g/dL — CL (ref 12.0–15.0)
MCH: 30.3 pg (ref 26.0–34.0)
MCHC: 33.3 g/dL (ref 30.0–36.0)
MCV: 90.9 fL (ref 78.0–100.0)
Platelets: 123 10*3/uL — ABNORMAL LOW (ref 150–400)
RBC: 2.08 MIL/uL — ABNORMAL LOW (ref 3.87–5.11)
RDW: 15.9 % — ABNORMAL HIGH (ref 11.5–15.5)
WBC: 23.9 10*3/uL — ABNORMAL HIGH (ref 4.0–10.5)

## 2014-08-31 LAB — HEMOGLOBIN AND HEMATOCRIT, BLOOD
HCT: 26.2 % — ABNORMAL LOW (ref 36.0–46.0)
Hemoglobin: 8.6 g/dL — ABNORMAL LOW (ref 12.0–15.0)

## 2014-08-31 LAB — PREPARE RBC (CROSSMATCH)

## 2014-08-31 MED ORDER — SODIUM CHLORIDE 0.9 % IV SOLN
Freq: Once | INTRAVENOUS | Status: AC
  Administered 2014-08-31: 10:00:00 via INTRAVENOUS

## 2014-08-31 MED ORDER — ACETAMINOPHEN 325 MG PO TABS
650.0000 mg | ORAL_TABLET | Freq: Once | ORAL | Status: AC
  Administered 2014-08-31: 650 mg via ORAL
  Filled 2014-08-31: qty 2

## 2014-08-31 MED ORDER — BISACODYL 10 MG RE SUPP
10.0000 mg | Freq: Every day | RECTAL | Status: DC | PRN
  Administered 2014-09-01 – 2014-09-02 (×2): 10 mg via RECTAL
  Filled 2014-08-31 (×3): qty 1

## 2014-08-31 MED ORDER — CLONAZEPAM 0.5 MG PO TABS
0.2500 mg | ORAL_TABLET | Freq: Two times a day (BID) | ORAL | Status: DC
  Administered 2014-08-31 – 2014-09-05 (×7): 0.25 mg via ORAL
  Filled 2014-08-31 (×10): qty 1

## 2014-08-31 NOTE — Progress Notes (Signed)
POD2  S/  Sleepy  O/BP 130/62 mmHg  Pulse 94  Temp(Src) 98.5 F (36.9 C) (Oral)  Resp 16  Ht 5\' 4"  (1.626 m)  Wt 213 lb (96.616 kg)  BMI 36.54 kg/m2  SpO2 99%  Lungs clear Abd + BS, Bandage C/D, some bruising CBC    Component Value Date/Time   WBC 23.9* 08/31/2014 0545   RBC 2.08* 08/31/2014 0545   HGB 6.3* 08/31/2014 0545   HCT 18.9* 08/31/2014 0545   PLT 123* 08/31/2014 0545   MCV 90.9 08/31/2014 0545   MCH 30.3 08/31/2014 0545   MCHC 33.3 08/31/2014 0545   RDW 15.9* 08/31/2014 0545   LYMPHSABS 3.9 07/05/2014 1042   MONOABS 0.7 07/05/2014 1042   EOSABS 0.2 07/05/2014 1042   BASOSABS 0.0 07/05/2014 1042    A+P/ post op anemia, discussed PRBC's, also she is oversedated from [place] and her reg PO meds, will alter to correct , and up to ambulate after transfusion

## 2014-08-31 NOTE — Progress Notes (Signed)
Received call from lab reporting critical value of 6.3 for Hb.  Called on call Dr. Julien Girt and reviewed lab.  Discussed pt VS and UOP, pt stable at this time.  Plan for Dr. Matthew Saras to address on rounds this morning. Will cont to monitor pt status.   Petra Kuba, RN

## 2014-08-31 NOTE — Progress Notes (Signed)
Much less sedated after MS pump off, second unit PRBC hanging, she is hungry , will incr diet and ambulate after second unit in

## 2014-08-31 NOTE — Anesthesia Postprocedure Evaluation (Signed)
  Anesthesia Post-op Note  Patient: [name]  Procedure(s) Performed: Procedure(s): EXPLORATORY LAPAROTOMY WITH LIGATION OF BLEEDER (N/A)  Patient Location: PACU  Anesthesia Type:General  Level of Consciousness: awake, alert  and oriented  Airway and Oxygen Therapy: Patient Spontanous Breathing  Post-op Pain: mild  Post-op Assessment: Post-op Vital signs reviewed and Patient's Cardiovascular Status Stable  Post-op Vital Signs: Reviewed  Last Vitals:  Filed Vitals:   08/31/14 0738  BP:   Pulse:   Temp:   Resp: 16    Complications: No apparent anesthesia complications

## 2014-09-01 ENCOUNTER — Inpatient Hospital Stay (HOSPITAL_COMMUNITY): Payer: BLUE CROSS/BLUE SHIELD

## 2014-09-01 LAB — TYPE AND SCREEN
ABO/RH(D): A POS
Antibody Screen: NEGATIVE
Unit division: 0
Unit division: 0
Unit division: 0
Unit division: 0

## 2014-09-01 NOTE — Progress Notes (Signed)
3 Days Post-Op Procedure(s) (LRB): EXPLORATORY LAPAROTOMY WITH LIGATION OF BLEEDER (N/A)/  S/P LAVH, midurethral sling  Subjective: Patient reports abd bloating, no flatus yet, is tol PO liquids + toast  Objective: I have reviewed patient's vital signs, labs and radiology results. BP 116/68 mmHg  Pulse 80  Temp(Src) 98.5 F (36.9 C) (Oral)  Resp 20  Ht 5\' 4"  (1.626 m)  Wt 213 lb (96.616 kg)  BMI 36.54 kg/m2  SpO2 98% CBC    Component Value Date/Time   WBC 23.9* 08/31/2014 0545   RBC 2.08* 08/31/2014 0545   HGB 8.6* 08/31/2014 2045   HCT 26.2* 08/31/2014 2045   PLT 123* 08/31/2014 0545   MCV 90.9 08/31/2014 0545   MCH 30.3 08/31/2014 0545   MCHC 33.3 08/31/2014 0545   RDW 15.9* 08/31/2014 0545   LYMPHSABS 3.9 07/05/2014 1042   MONOABS 0.7 07/05/2014 1042   EOSABS 0.2 07/05/2014 1042   BASOSABS 0.0 07/05/2014 1042   abd  Film this am c/w ileus   Resp: clear to auscultation bilaterally GI: hypoactive BS, distended but nontender, Inc C/D with bruising, no cellulitis  Assessment: s/p Procedure(s): EXPLORATORY LAPAROTOMY WITH LIGATION OF BLEEDER (N/A): CBC better after 2 u PRBC, sedation improved, have discont or reduced meds that may cause sedation   Post op ileus Plan: Encourage ambulation Clear liquids, PT consult to assist ambulation, dulcolax supp  LOS: 3 days    [name] M 09/01/2014, 12:55 PM

## 2014-09-01 NOTE — Progress Notes (Signed)
Pt vomited moderate amount green emesis. Abd firm with hypoactive BS. MD notified, new orders given and followed.

## 2014-09-02 LAB — COMPREHENSIVE METABOLIC PANEL
ALT: 28 U/L (ref 14–54)
AST: 19 U/L (ref 15–41)
Albumin: 2.7 g/dL — ABNORMAL LOW (ref 3.5–5.0)
Alkaline Phosphatase: 64 U/L (ref 38–126)
Anion gap: 7 (ref 5–15)
BUN: 13 mg/dL (ref 6–20)
CO2: 37 mmol/L — ABNORMAL HIGH (ref 22–32)
Calcium: 8.3 mg/dL — ABNORMAL LOW (ref 8.9–10.3)
Chloride: 96 mmol/L — ABNORMAL LOW (ref 101–111)
Creatinine, Ser: 0.44 mg/dL (ref 0.44–1.00)
GFR calc Af Amer: >60 mL/min (ref >60)
GFR calc non Af Amer: >60 mL/min (ref >60)
Glucose, Bld: 120 mg/dL — ABNORMAL HIGH (ref 65–99)
Potassium: 4.3 mmol/L (ref 3.5–5.1)
Sodium: 140 mmol/L (ref 135–145)
Total Bilirubin: 0.6 mg/dL (ref 0.3–1.2)
Total Protein: 5.4 g/dL — ABNORMAL LOW (ref 6.5–8.1)

## 2014-09-02 LAB — CBC
HCT: 25.1 % — ABNORMAL LOW (ref 36.0–46.0)
Hemoglobin: 8.1 g/dL — ABNORMAL LOW (ref 12.0–15.0)
MCH: 30 pg (ref 26.0–34.0)
MCHC: 32.3 g/dL (ref 30.0–36.0)
MCV: 93 fL (ref 78.0–100.0)
Platelets: 178 10*3/uL (ref 150–400)
RBC: 2.7 MIL/uL — ABNORMAL LOW (ref 3.87–5.11)
RDW: 14.6 % (ref 11.5–15.5)
WBC: 19.2 10*3/uL — ABNORMAL HIGH (ref 4.0–10.5)

## 2014-09-02 NOTE — Progress Notes (Signed)
4 Days Post-Op Procedure(s) (LRB): EXPLORATORY LAPAROTOMY WITH LIGATION OF BLEEDER (N/A), S/P LAVH, MUS  Subjective: Patient reports + flatus.  tol liq, but had nausea last pm, PT has not been by yet  Objective: I have reviewed patient's vital signs, intake and output, medications and labs. BP 139/70 mmHg  Pulse 69  Temp(Src) 98.4 F (36.9 C) (Oral)  Resp 18  Ht 5\' 4"  (1.626 m)  Wt 213 lb (96.616 kg)  BMI 36.54 kg/m2  SpO2 100%   General: alert GI: decr distension, hypoactive BS   abd film>>Findings consistent with a generalized ileus. A distal colonic obstruction is felt less likely.  Assessment: s/p Procedure(s): EXPLORATORY LAPAROTOMY WITH LIGATION OF BLEEDER (N/A): ILEUS  Plan: Encourage ambulation, will call PT about assist w/ ambulation, dulcolax supp today, check labs  LOS: 4 days    Alanny Rivers M 09/02/2014, 7:14 AM

## 2014-09-02 NOTE — Care Management Note (Signed)
Case Management Note  Patient Details  Name: [name] MRN: [id number] Date of Birth: 06/26/1960  Subjective/Objective:      54 yo female admitted with pelvic pain              Action/Plan: Patient is from home. No follow up recommendations per PT eval.  Expected Discharge Date:  08/31/14               Expected Discharge Plan:  Home/Self Care  In-House Referral:     Discharge planning Services  CM Consult  Post Acute Care Choice:  NA Choice offered to:  NA  DME Arranged:    DME Agency:     HH Arranged:    [place] Agency:     Status of Service:  Completed, signed off  Medicare Important Message Given:    Date Medicare IM Given:    Medicare IM give by:    Date Additional Medicare IM Given:    Additional Medicare Important Message give by:     If discussed at [place] of Stay Meetings, dates discussed:    Additional Comments:  Scot Dock, RN 09/02/2014, 3:43 PM

## 2014-09-02 NOTE — Evaluation (Signed)
Physical Therapy Evaluation Patient Details Name: [name] MRN: [id number] DOB: 02-24-1961 Today's Date: 09/02/2014   History of Present Illness  EXPLORATORY LAPAROTOMY WITH LIGATION OF BLEEDER (  Clinical Impression  Patient c/o nausea bur able to ambulated 40' x 2. Patient will benefit from PT to address problems listed in note below. Encouraged patient to ambulate with nursing also.    Follow Up Recommendations No PT follow up    Equipment Recommendations  None recommended by PT    Recommendations for Other Services       Precautions / Restrictions Precautions Precaution Comments: woozy Restrictions Weight Bearing Restrictions: No      Mobility  Bed Mobility Overal bed mobility: Needs Assistance Bed Mobility: Supine to Sit     Supine to sit: Min assist;HOB elevated     General bed mobility comments: support for trunk into upright, cues to roll first  Transfers Overall transfer level: Needs assistance Equipment used: Rolling walker (2 wheeled) Transfers: Sit to/from Stand Sit to Stand: Min assist         General transfer comment: cues for safety  Ambulation/Gait Ambulation/Gait assistance: Min assist;+2 safety/equipment Ambulation Distance (Feet): 40 Feet (x 2) Assistive device: Rolling walker (2 wheeled)       General Gait Details: gait is slow, c/o dizziness. BP 141/58, pre, then 113/58. no further c/o dizziness once up and moving.  Stairs            Wheelchair Mobility    Modified Rankin (Stroke Patients Only)       Balance                                             Pertinent Vitals/Pain Pain Assessment: 0-10 Pain Score: 6  Pain Location: abdomen Pain Descriptors / Indicators: Aching;Cramping Pain Intervention(s): Monitored during session;Premedicated before session    Home Living Family/patient expects to be discharged to:: Private residence   Available Help at Discharge: Family Type of Home:  House Home Access: Stairs to enter Entrance Stairs-Rails: Psychiatric nurse of Steps: 6 Home Layout: One level Home Equipment: Environmental consultant - 2 wheels;Walker - standard;Cane - single point      Prior Function Level of Independence: Independent with assistive device(s)               Hand Dominance        Extremity/Trunk Assessment   Upper Extremity Assessment: Overall WFL for tasks assessed           Lower Extremity Assessment: Overall WFL for tasks assessed      Cervical / Trunk Assessment: Normal  Communication   Communication: No difficulties  Cognition Arousal/Alertness: Awake/alert Behavior During Therapy: Anxious Overall Cognitive Status: Within Functional Limits for tasks assessed                      General Comments      Exercises        Assessment/Plan    PT Assessment Patient needs continued PT services  PT Diagnosis Difficulty walking;Acute pain   PT Problem List Decreased strength;Decreased activity tolerance;Decreased mobility;Pain  PT Treatment Interventions DME instruction;Gait training;Functional mobility training;Therapeutic activities;Patient/family education   PT Goals (Current goals can be found in the Care Plan section) Acute Rehab PT Goals Patient Stated Goal: to not have pain and nausea, move around PT Goal Formulation: With patient/family Time For Goal Achievement:  09/16/14 Potential to Achieve Goals: Good    Frequency Min 3X/week   Barriers to discharge        Co-evaluation               End of Session   Activity Tolerance: Patient tolerated treatment well Patient left: in chair;with call bell/phone within reach;with family/visitor present Nurse Communication: Mobility status         Time: 1155-2080 PT Time Calculation (min) (ACUTE ONLY): 21 min   Charges:   PT Evaluation $Initial PT Evaluation Tier I: 1 Procedure     PT G CodesClaretha Cooper 09/02/2014, 10:35  AM Tresa Endo PT [phone]

## 2014-09-02 NOTE — Plan of Care (Signed)
Problem: Phase III Progression Outcomes Goal: Activity at appropriate level-compared to baseline (UP IN CHAIR FOR HEMODIALYSIS)  Outcome: Completed/Met Date Met:  09/02/14 Patient ambulated in hallway with front wheel walker,tolerated ambulation well.

## 2014-09-03 LAB — CBC
HCT: 25.5 % — ABNORMAL LOW (ref 36.0–46.0)
Hemoglobin: 8.3 g/dL — ABNORMAL LOW (ref 12.0–15.0)
MCH: 29.3 pg (ref 26.0–34.0)
MCHC: 32.5 g/dL (ref 30.0–36.0)
MCV: 90.1 fL (ref 78.0–100.0)
Platelets: 253 10*3/uL (ref 150–400)
RBC: 2.83 MIL/uL — ABNORMAL LOW (ref 3.87–5.11)
RDW: 13.9 % (ref 11.5–15.5)
WBC: 15.3 10*3/uL — ABNORMAL HIGH (ref 4.0–10.5)

## 2014-09-03 MED ORDER — OXYCODONE-ACETAMINOPHEN 5-325 MG PO TABS: 1.0000 | ORAL_TABLET | ORAL | Status: DC | PRN

## 2014-09-03 MED ORDER — MAGNESIUM HYDROXIDE 400 MG/5ML PO SUSP
30.0000 mL | Freq: Every day | ORAL | Status: AC
  Administered 2014-09-03 – 2014-09-04 (×2): 30 mL via ORAL
  Filled 2014-09-03 (×2): qty 30

## 2014-09-03 MED ORDER — ONDANSETRON HCL 8 MG PO TABS: 8.0000 mg | ORAL_TABLET | Freq: Three times a day (TID) | ORAL | Status: DC | PRN

## 2014-09-03 MED ORDER — IBUPROFEN 800 MG PO TABS: 800.0000 mg | ORAL_TABLET | Freq: Three times a day (TID) | ORAL | Status: DC | PRN

## 2014-09-03 NOTE — Progress Notes (Signed)
Vital signs taken.  131/49 BP, 72 HR, 98% room air, 18 R, 98.0 Temp.

## 2014-09-03 NOTE — Progress Notes (Signed)
Notified physician of CBC results.  Pt reports " I am not ready to go home today."  Physician orders cancel discharge for today.  Reported to physician that pt reports continued nausea.   Physician reports to plan on discharge tomorrow, 5/29.

## 2014-09-03 NOTE — Progress Notes (Signed)
5 Days Post-Op Procedure(s) (LRB): EXPLORATORY LAPAROTOMY WITH LIGATION OF BLEEDER (N/A)  Subjective: Patient reports tolerating PO, + flatus and + BM.    Objective: I have reviewed patient's vital signs, medications and labs.  General: alert GI: soft, non-tender; bowel sounds normal; no masses,  no organomegaly, decr distension, ++ BS  Assessment: s/p Procedure(s): EXPLORATORY LAPAROTOMY WITH LIGATION OF BLEEDER (N/A): stable  Plan: Discharge home  LOS: 5 days    [name] M 09/03/2014, 8:37 AM

## 2014-09-03 NOTE — Progress Notes (Signed)
Called to pt room.  Pt coming out of bathroom and bright red blood draining out of abdomen.  Daughter at bedside and assisting pt back to bed.  4x4 gauze applied.  Tried to walk pt and pt reports "I can't do this anymore " and reports dizziness.  Put back to bed.  No additional bleeding on gauze noted.  Notified physician.  Physician orders CBC and reports pt to get up and move around and drink fluids.  Pt lying in bed, reports dizziness, and will not ambulate.  Physician reports blood coming from bruising up under skin, reports change dressing PRN, and reports bleeding will seal off.  Reported this information to pt and pt daughter.  Physician orders STAT CBC.

## 2014-09-03 NOTE — Progress Notes (Signed)
Reviewed exam with pt this am, she and daughter seemed ready for D/c..I have attempted to contact this patient by phone with the following results: {call  By RN later stated pt still with nausea and nlt feeling strong enough to go home, repeat CBC stable  Will give laxative, ambulate and push PO fluids, plan D/C am

## 2014-09-03 NOTE — Discharge Summary (Signed)
Physician Discharge Summary  Patient ID: [name] MRN: [id number] DOB/AGE: 55-Dec-1962 55 y.o.  Admit date: 08/29/2014 Discharge date: 09/03/2014  Admission Diagnoses: 1. Pelvic pain 2. Stress urinary incontinence 3. History of ovarian cancer  Discharge Diagnoses: Same plus LAVH BSO, mid urethral sling, postoperative bleeding requiring exploration with subsequent transfusion Active Problems:   Pelvic pain in female   Discharged Condition: good  Hospital Course: This patient was admitted for LAVH BSO, mid urethral sling. Surgery was carried out and she developed some increased abdominal pain in the recovery room with a drop in her hemoglobin, decision made to take her back to the operating room the day of surgery where laparotomy was performed, there was a vaginal cuff bleeder that was oversewed and she did receive 2 units of blood intraoperatively into the following day. For the first 2 postoperative days, she had some sedation related to some of her medicines which were cut way back, she received 2 additional units on postop day #1. Her sedation gradually improved but she developed ileus, this is confirmed by upright abdominal film. She was kept to liquids, PT consult was obtained to facilitate ambulation with Dulcolax suppositories and had slow improvement of her GI function. The day of discharge she was afebrile hemoglobin was 8.1, her abdominal exam was much improved she had positive flatus and small bowel movement and was tolerating small feedings.   Consults: Physical therapy  Significant Diagnostic Studies: labs:  CBC    Component Value Date/Time   WBC 19.2* 09/02/2014 0755   RBC 2.70* 09/02/2014 0755   HGB 8.1* 09/02/2014 0755   HCT 25.1* 09/02/2014 0755   PLT 178 09/02/2014 0755   MCV 93.0 09/02/2014 0755   MCH 30.0 09/02/2014 0755   MCHC 32.3 09/02/2014 0755   RDW 14.6 09/02/2014 0755   LYMPHSABS 3.9 07/05/2014 1042   MONOABS 0.7 07/05/2014 1042   EOSABS 0.2  07/05/2014 1042   BASOSABS 0.0 07/05/2014 1042      Treatments: surgery: LAVH BSO, mid urethral sling, follow-up laparotomy with suturing of vaginal cuff  Discharge Exam: Blood pressure 145/61, pulse 71, temperature 98.4 F (36.9 C), temperature source Oral, resp. rate 20, height 5\' 4"  (1.626 m), weight 213 lb (96.616 kg), SpO2 93 %. Patient was alert, lungs clear the day of discharge. She had some incisional bruising but no evidence of cellulitis positive bowel sounds, abdomen was soft  Disposition: 01-Home or Self Care     Medication List    TAKE these medications        albuterol 108 (90 BASE) MCG/ACT inhaler  Commonly known as:  VENTOLIN HFA  Inhale 2 puffs into the lungs every 4 (four) hours as needed for wheezing or shortness of breath.     amphetamine-dextroamphetamine 20 MG tablet  Commonly known as:  ADDERALL  Take 1 tablet (20 mg total) by mouth 2 (two) times daily.     benzonatate 100 MG capsule  Commonly known as:  TESSALON PERLES  Take 2 capsules (200 mg total) by mouth 3 (three) times daily as needed for cough (Max: 600mg  per day (6 capsules per day)).     cholecalciferol 1000 UNITS tablet  Commonly known as:  VITAMIN D  Take 7,000 Units by mouth at bedtime.     clonazePAM 2 MG tablet  Commonly known as:  KLONOPIN  Take 1 tablet (2 mg total) by mouth 2 (two) times daily.     ferrous sulfate 325 (65 FE) MG tablet  Take 325 mg by  mouth at bedtime.     fluticasone 50 MCG/ACT nasal spray  Commonly known as:  FLONASE  Place 1 spray into both nostrils daily.     fluvoxaMINE 100 MG tablet  Commonly known as:  LUVOX  TAKE ONE TABLET BY MOUTH AT BEDTIME     gabapentin 300 MG capsule  Commonly known as:  NEURONTIN  Take 1 capsule (300 mg total) by mouth 3 (three) times daily.     ibuprofen 800 MG tablet  Commonly known as:  ADVIL,MOTRIN  Take 1 tablet (800 mg total) by mouth every 8 (eight) hours as needed (mild pain).     levofloxacin 500 MG tablet   Commonly known as:  LEVAQUIN  Take 500 mg by mouth daily. Starting 08/19/14 for 7 days.     levothyroxine 75 MCG tablet  Commonly known as:  SYNTHROID, LEVOTHROID  Take 1 tablet (75 mcg total) by mouth daily before breakfast.     Magnesium 250 MG Tabs  Take 250 mg by mouth at bedtime.     montelukast 10 MG tablet  Commonly known as:  SINGULAIR  Take 1 tablet (10 mg total) by mouth daily.     ondansetron 8 MG tablet  Commonly known as:  ZOFRAN  Take 1 tablet (8 mg total) by mouth every 8 (eight) hours as needed for nausea or vomiting.     oxybutynin 5 MG tablet  Commonly known as:  DITROPAN  Take 1 tablet (5 mg total) by mouth every 8 (eight) hours as needed for bladder spasms.     oxyCODONE-acetaminophen 5-325 MG per tablet  Commonly known as:  PERCOCET/ROXICET  Take 1-2 tablets by mouth every 4 (four) hours as needed for severe pain (moderate to severe pain (when tolerating fluids)).     pregabalin 75 MG capsule  Commonly known as:  LYRICA  Take 1 capsule (75 mg total) by mouth 2 (two) times daily.           Follow-up Information    Follow up with Margarette Asal, MD.   Specialty:  Obstetrics and Gynecology   Why:  office will call   Contact information:   [address] [place] [address] [phone]       Signed: Margarette Asal 09/03/2014, 8:55 AM

## 2014-09-04 ENCOUNTER — Inpatient Hospital Stay (HOSPITAL_COMMUNITY): Payer: BLUE CROSS/BLUE SHIELD

## 2014-09-04 LAB — CBC
HCT: 26.8 % — ABNORMAL LOW (ref 36.0–46.0)
Hemoglobin: 8.9 g/dL — ABNORMAL LOW (ref 12.0–15.0)
MCH: 30.2 pg (ref 26.0–34.0)
MCHC: 33.2 g/dL (ref 30.0–36.0)
MCV: 90.8 fL (ref 78.0–100.0)
Platelets: 300 10*3/uL (ref 150–400)
RBC: 2.95 MIL/uL — ABNORMAL LOW (ref 3.87–5.11)
RDW: 14.2 % (ref 11.5–15.5)
WBC: 15.9 10*3/uL — ABNORMAL HIGH (ref 4.0–10.5)

## 2014-09-04 LAB — COMPREHENSIVE METABOLIC PANEL
ALT: 22 U/L (ref 14–54)
AST: 16 U/L (ref 15–41)
Albumin: 2.7 g/dL — ABNORMAL LOW (ref 3.5–5.0)
Alkaline Phosphatase: 64 U/L (ref 38–126)
Anion gap: 7 (ref 5–15)
BUN: 17 mg/dL (ref 6–20)
CO2: 31 mmol/L (ref 22–32)
Calcium: 8.5 mg/dL — ABNORMAL LOW (ref 8.9–10.3)
Chloride: 99 mmol/L — ABNORMAL LOW (ref 101–111)
Creatinine, Ser: 0.4 mg/dL — ABNORMAL LOW (ref 0.44–1.00)
GFR calc Af Amer: >60 mL/min (ref >60)
GFR calc non Af Amer: >60 mL/min (ref >60)
Glucose, Bld: 119 mg/dL — ABNORMAL HIGH (ref 65–99)
Potassium: 3.7 mmol/L (ref 3.5–5.1)
Sodium: 137 mmol/L (ref 135–145)
Total Bilirubin: 1.1 mg/dL (ref 0.3–1.2)
Total Protein: 5.5 g/dL — ABNORMAL LOW (ref 6.5–8.1)

## 2014-09-04 LAB — LIPASE, BLOOD: Lipase: 33 U/L (ref 22–51)

## 2014-09-04 LAB — AMYLASE: Amylase: 27 U/L — ABNORMAL LOW (ref 28–100)

## 2014-09-04 MED ORDER — FLEET ENEMA 7-19 GM/118ML RE ENEM
1.0000 | ENEMA | Freq: Once | RECTAL | Status: AC
  Administered 2014-09-04: 1 via RECTAL
  Filled 2014-09-04: qty 1

## 2014-09-04 MED ORDER — DEXTROSE IN LACTATED RINGERS 5 % IV SOLN
INTRAVENOUS | Status: DC
  Administered 2014-09-04 – 2014-09-06 (×6): via INTRAVENOUS

## 2014-09-04 MED ORDER — MAGNESIUM HYDROXIDE 400 MG/5ML PO SUSP: 30.0000 mL | Freq: Every day | ORAL | Status: DC

## 2014-09-04 MED ORDER — FUROSEMIDE 10 MG/ML IJ SOLN
20.0000 mg | Freq: Once | INTRAMUSCULAR | Status: AC
  Administered 2014-09-04: 20 mg via INTRAVENOUS
  Filled 2014-09-04: qty 2

## 2014-09-04 MED ORDER — SENNA 8.6 MG PO TABS
2.0000 | ORAL_TABLET | Freq: Once | ORAL | Status: AC
  Administered 2014-09-04: 17.2 mg via ORAL
  Filled 2014-09-04: qty 2

## 2014-09-04 NOTE — Progress Notes (Signed)
Notified physician that patient reports she is "miserable, can't explain."  Pt given zofran twice last night.  Pt reports zofran does not help.  Pt reports she got up to chair and walked to bathroom last night.  No bowel movement yesterday or last night per patient.  Pt reports she cannot eat or drink anything.  Physician reports he will see her upon arrival to floor.

## 2014-09-04 NOTE — Consult Note (Signed)
Referring Provider: Dr. Janine Ores Primary Care Physician:  Alesia Richards, MD Primary Gastroenterologist:  None (unassigned). Had colonoscopy by Dr. Harrell Lark in [place].  Reason for Consultation:  Abdominal pain,? Ileus.  HPI: [name] is a 54 y.o. female who is approximately a week status post laparoscopic assisted vaginal hysterectomy who has had upper abdominal pain, burning and "gripping" in character, with nausea but no vomiting ever since the surgery. She has not had a good bowel movement since the surgery; at baseline, she uses magnesium supplements daily to help maintain defecation but does not consider herself chronically constipated.  She does not have prior problems of this type, specifically, no antecedent upper or lower GI tract symptoms such as anorexia, weight loss, reflux symptoms, dysphagia, abdominal pain, nausea, change in bowel habits, constipation, diarrhea.  3 days ago, she had radiographic evidence of a generalized ileus with large and small bowel gas. She seemed to be getting better, and her potassium level was in the normal range, but then today she was again uncomfortable, prompting this consultation. Her KUB today, which I reviewed, shows resolution of the intestinal gas, but she does seem to have quite a bit of stool throughout the colon, especially the proximal colon.  She states that she had colonoscopy recently by Dr. Ferdinand Lango in [place], and that it showed 2 polyps.     Past Medical History  Diagnosis Date  . Fibromyalgia   . PONV (postoperative nausea and vomiting)     used a scop patch last surg-did well  . Hyperlipidemia   . COPD (chronic obstructive pulmonary disease)   . Prediabetes   . Restless leg syndrome   . Hypertension     normal now, only took medications for 1 month- thinks it was from pain.  Marland Kitchen Hypothyroidism   . Hemorrhoid   . Anxiety   . Depression   . ADD (attention deficit disorder)   . Bladder neoplasm   .  History of melanoma excision     leg  . Iron deficiency anemia   . Cancer 2015    Bladder cancer  . Shortness of breath dyspnea     with exertion  . H/O bronchitis     treated within the last week w steroids, antibx tessalon pearls.   . SUI (stress urinary incontinence, female)   . Fibroids   . Wears glasses   . H/O sebaceous cyst 07/18/14    left breast     Past Surgical History  Procedure Laterality Date  . Ankle fusion Right 2011  . Knee arthroscopy with medial menisectomy Right 05/22/2012    Procedure: KNEE ARTHROSCOPY WITH MEDIAL MENISECTOMY and Medial Plica excision;  Surgeon: Alta Corning, MD;  Location: [place];  Service: Orthopedics;  Laterality: Right;  . Anterior cervical decomp/discectomy fusion N/A 09/01/2013    Procedure: Anterior cervical decompression fusion, cervical 5-6 with instrumentation and allograft;  Surgeon: Sinclair Ship, MD;  Location: [place];  Service: Orthopedics;  Laterality: N/A;  Anterior cervical decompression fusion, cervical 5-6 with instrumentation and allograft  . Shoulder arthroscopy with subacromial decompression Left 11/10/2013    Procedure: SHOULDER ARTHROSCOPY WITH SUBACROMIAL DECOMPRESSION WITH ROTATOR CUFF REPAIR AND BICEPS TENODESIS;  Surgeon: Alta Corning, MD;  Location: [place];  Service: Orthopedics;  Laterality: Left;  . Closed reduction and pinning left fifth  proximal phalangeal fx  10-09-2005  . D & c hysteroscopy with novasure endometrial ablation  07-16-2006  . Shoulder arthroscopy w/  subacromial decompression and distal clavicle excision Left 11-27-2011    W/  DEBRIDEMENT  . Cataract extraction w/ intraocular lens implant Bilateral   . Tonsillectomy  age 50  . Laparoscopic cholecystectomy  2005  . Tubal ligation  1980  . Transurethral resection of bladder tumor N/A 01/11/2014    Procedure: TRANSURETHRAL RESECTION OF BLADDER TUMOR (TURBT);  Surgeon: Festus Aloe, MD;  Location: [facility];  Service: Urology;  Laterality: N/A;  . Cystoscopy w/ retrogrades Left 01/11/2014    Procedure: CYSTOSCOPY WITH RETROGRADE PYELOGRAM;  Surgeon: Festus Aloe, MD;  Location: [facility];  Service: Urology;  Laterality: Left;  . Cystoscopy with stent placement Left 01/11/2014    Procedure: CYSTOSCOPY WITH STENT PLACEMENT;  Surgeon: Festus Aloe, MD;  Location: [facility];  Service: Urology;  Laterality: Left;  . Laparoscopic assisted vaginal hysterectomy N/A 08/29/2014    Procedure: LAPAROSCOPIC ASSISTED VAGINAL HYSTERECTOMY;  Surgeon: Molli Posey, MD;  Location: [facility];  Service: Gynecology;  Laterality: N/A;  . Salpingoophorectomy Bilateral 08/29/2014    Procedure: SALPINGO OOPHORECTOMY;  Surgeon: Molli Posey, MD;  Location: [facility];  Service: Gynecology;  Laterality: Bilateral;  . Pubovaginal sling N/A 08/29/2014    Procedure: Okey Dupre SINGLE INCISION SLING;  Surgeon: Molli Posey, MD;  Location: [facility];  Service: Gynecology;  Laterality: N/A;  . Laparotomy N/A 08/29/2014    Procedure: EXPLORATORY LAPAROTOMY WITH LIGATION OF BLEEDER;  Surgeon: Molli Posey, MD;  Location: [facility];  Service: Gynecology;  Laterality: N/A;    Prior to Admission medications   Medication Sig Start Date End Date Taking? Authorizing Provider  albuterol (VENTOLIN HFA) 108 (90 BASE) MCG/ACT inhaler Inhale 2 puffs into the lungs every 4 (four) hours as needed for wheezing or shortness of breath. 07/05/14  Yes Vicie Mutters, PA-C  amphetamine-dextroamphetamine (ADDERALL) 20 MG tablet Take 1 tablet (20 mg total) by mouth 2 (two) times daily. 07/29/14  Yes Vicie Mutters, PA-C  benzonatate (TESSALON PERLES) 100 MG capsule Take 2 capsules (200 mg total) by mouth 3 (three) times daily as needed for cough (Max: 600mg  per day (6 capsules per day)). 08/05/14  Yes Vicie Mutters, PA-C  cholecalciferol (VITAMIN  D) 1000 UNITS tablet Take 7,000 Units by mouth at bedtime.    Yes Historical Provider, MD  clonazePAM (KLONOPIN) 2 MG tablet Take 1 tablet (2 mg total) by mouth 2 (two) times daily. 06/21/14  Yes Unk Pinto, MD  ferrous sulfate 325 (65 FE) MG tablet Take 325 mg by mouth at bedtime.   Yes Historical Provider, MD  fluticasone (FLONASE) 50 MCG/ACT nasal spray Place 1 spray into both nostrils daily. 08/19/14  Yes Historical Provider, MD  fluvoxaMINE (LUVOX) 100 MG tablet TAKE ONE TABLET BY MOUTH AT BEDTIME Patient taking differently: Take 100 mg by mouth at bedtime. 07/06/14  Yes Unk Pinto, MD  gabapentin (NEURONTIN) 300 MG capsule Take 1 capsule (300 mg total) by mouth 3 (three) times daily. 07/05/14  Yes Vicie Mutters, PA-C  levothyroxine (SYNTHROID, LEVOTHROID) 75 MCG tablet Take 1 tablet (75 mcg total) by mouth daily before breakfast. 07/05/14  Yes Vicie Mutters, PA-C  Magnesium 250 MG TABS Take 250 mg by mouth at bedtime.   Yes Historical Provider, MD  montelukast (SINGULAIR) 10 MG tablet Take 1 tablet (10 mg total) by mouth daily. 07/05/14 07/05/15 Yes Vicie Mutters, PA-C  oxybutynin (DITROPAN) 5 MG tablet Take 1 tablet (5 mg total) by mouth every 8 (eight) hours as  needed for bladder spasms. 01/11/14  Yes Festus Aloe, MD  oxyCODONE-acetaminophen (PERCOCET/ROXICET) 5-325 MG per tablet Take 1-2 tablets by mouth every 6 (six) hours as needed for severe pain. 11/10/13  Yes Gary Fleet, PA-C  pregabalin (LYRICA) 75 MG capsule Take 1 capsule (75 mg total) by mouth 2 (two) times daily. 06/21/14 06/21/15 Yes Vicie Mutters, PA-C  ibuprofen (ADVIL,MOTRIN) 800 MG tablet Take 1 tablet (800 mg total) by mouth every 8 (eight) hours as needed (mild pain). 09/03/14   Molli Posey, MD  levofloxacin (LEVAQUIN) 500 MG tablet Take 500 mg by mouth daily. Starting 08/19/14 for 7 days. 08/19/14   Historical Provider, MD  ondansetron (ZOFRAN) 8 MG tablet Take 1 tablet (8 mg total) by mouth every 8 (eight) hours  as needed for nausea or vomiting. 09/03/14   Molli Posey, MD  oxyCODONE-acetaminophen (PERCOCET/ROXICET) 5-325 MG per tablet Take 1-2 tablets by mouth every 4 (four) hours as needed for severe pain (moderate to severe pain (when tolerating fluids)). 09/03/14   Molli Posey, MD    Current Facility-Administered Medications  Medication Dose Route Frequency Provider Last Rate Last Dose  . albuterol (PROVENTIL) (2.5 MG/3ML) 0.083% nebulizer solution 2.5 mg  2.5 mg Nebulization Q4H PRN Molli Posey, MD      . amphetamine-dextroamphetamine (ADDERALL) tablet 20 mg  20 mg Oral BID Molli Posey, MD   20 mg at 08/30/14 2239  . bisacodyl (DULCOLAX) suppository 10 mg  10 mg Rectal Daily PRN Molli Posey, MD   10 mg at 09/02/14 1413  . clonazePAM (KLONOPIN) tablet 0.25 mg  0.25 mg Oral BID Molli Posey, MD   0.25 mg at 09/03/14 0912  . dextrose 5 % in lactated ringers infusion   Intravenous Continuous Molli Posey, MD 10 mL/hr at 09/01/14 1535    . dextrose 5 % in lactated ringers infusion   Intravenous Continuous Molli Posey, MD 125 mL/hr at 09/04/14 1015    . fluvoxaMINE (LUVOX) tablet 100 mg  100 mg Oral QHS Molli Posey, MD   100 mg at 09/01/14 2132  . ibuprofen (ADVIL,MOTRIN) tablet 800 mg  800 mg Oral Q8H PRN Molli Posey, MD   800 mg at 09/02/14 2334  . levothyroxine (SYNTHROID, LEVOTHROID) tablet 75 mcg  75 mcg Oral QAC breakfast Molli Posey, MD   75 mcg at 09/03/14 0912  . montelukast (SINGULAIR) tablet 10 mg  10 mg Oral Daily Molli Posey, MD   10 mg at 09/03/14 1537  . ondansetron (ZOFRAN) tablet 4 mg  4 mg Oral Q6H PRN Molli Posey, MD   4 mg at 09/02/14 0826   Or  . ondansetron [facility]) injection 4 mg  4 mg Intravenous Q6H PRN Molli Posey, MD   4 mg at 09/04/14 1034  . ondansetron (ZOFRAN) tablet 4 mg  4 mg Oral Q6H PRN Molli Posey, MD      . oxybutynin [facility]) tablet 5 mg  5 mg Oral Q8H PRN Molli Posey, MD      . oxyCODONE-acetaminophen  (PERCOCET/ROXICET) 5-325 MG per tablet 1-2 tablet  1-2 tablet Oral Q4H PRN Molli Posey, MD      . oxyCODONE-acetaminophen (PERCOCET/ROXICET) 5-325 MG per tablet 1-2 tablet  1-2 tablet Oral Q4H PRN Molli Posey, MD   2 tablet at 08/31/14 1939    Allergies as of 08/04/2014 - Review Complete 07/18/2014  Allergen Reaction Noted  . Sulfa antibiotics Hives and Itching 11/25/2011    Family History  Problem Relation Age of Onset  . Cancer Father  Lung and stomach   . Heart disease Father   . Hypertension Father   . Stroke Father   . Heart failure Paternal Grandfather   . Cancer Paternal Grandfather     colon cancer  . Mental retardation Son   . Ovarian cancer Mother 94  . Breast cancer Mother     dx in her late 15s-50s  . Thyroid cancer Mother   . Ovarian cancer Sister 28  . Cancer Brother     abdominal tumor  . Ovarian cancer Maternal Aunt   . Breast cancer Maternal Aunt   . Breast cancer Maternal Uncle   . Breast cancer Paternal Aunt   . Ovarian cancer Sister 65  . Ovarian cancer Sister 65  . Cancer Brother     abdominal tumor  . Breast cancer Paternal Aunt   . Breast cancer Paternal Aunt   . Breast cancer Paternal Aunt   . Breast cancer Cousin   . Breast cancer Cousin     History   Social History  . Marital Status: Married    Spouse Name: N/A  . Number of Children: 2  . Years of Education: N/A   Occupational History  . logistic specialist    Social History Main Topics  . Smoking status: Former Smoker -- 1.00 packs/day for 30 years    Types: Cigarettes    Quit date: 06/24/2009  . Smokeless tobacco: Never Used  . Alcohol Use: No  . Drug Use: No  . Sexual Activity: Not on file   Other Topics Concern  . Not on file   Social History Narrative    Review of Systems:  Tendency toward constipation, as described in history of present illness. Please see history of present illness for additional pertinent review of systems.  Physical Exam: Vital signs  in last 24 hours: Temp:  [98 F (36.7 C)-98.4 F (36.9 C)] 98.4 F (36.9 C) (05/29 8341) Pulse Rate:  [63-72] 63 (05/29 0614) Resp:  [18] 18 (05/29 0614) BP: (131-153)/(49-72) 150/59 mmHg (05/29 0614) SpO2:  [92 %-98 %] 94 % (05/29 0614) Last BM Date: 09/02/14 (per pt) General:   Alert,  Well-developed, well-nourished, pleasant and cooperative in NAD, somewhat overweight body habitus Head:  Normocephalic and atraumatic. Eyes:  Sclera clear, no icterus.   Conjunctiva pale. Mouth:   No ulcerations or lesions.  Oropharynx pink & moist. Neck:   No masses or thyromegaly. Lungs:  Clear throughout to auscultation.   No wheezes, crackles, or rhonchi. No evident respiratory distress. Heart:   Regular rate and rhythm; no murmurs, clicks, rubs,  or gallops. Abdomen: Obese, Soft, nontender, nontympanitic, and nondistended, no succussion splash. No masses, hepatosplenomegaly or ventral hernias noted. Normal bowel sounds, without bruits, guarding, or rebound.   Rectal:  Very generous amount of mushy brown stool present.   Msk:   Symmetrical without gross deformities. Extremities:   Without clubbing, cyanosis, or edema. Neurologic:  Alert and coherent;  grossly normal neurologically. Skin:  Intact without significant lesions or rashes. Cervical Nodes:  No significant cervical adenopathy. Psych:   Alert and cooperative. Normal mood and affect.  Intake/Output from previous day: 05/28 0701 - 05/29 0700 In: 120 [P.O.:120] Out: 250 [Urine:250] Intake/Output this shift:    Lab Results:  Recent Labs  09/02/14 0755 09/03/14 1211 09/04/14 0923  WBC 19.2* 15.3* 15.9*  HGB 8.1* 8.3* 8.9*  HCT 25.1* 25.5* 26.8*  PLT 178 253 300   BMET  Recent Labs  09/02/14 0755 09/04/14 0923  NA  140 137  K 4.3 3.7  CL 96* 99*  CO2 37* 31  GLUCOSE 120* 119*  BUN 13 17  CREATININE 0.44 0.40*  CALCIUM 8.3* 8.5*   LFT  Recent Labs  06-Sep-2014 0923  PROT 5.5*  ALBUMIN 2.7*  AST 16  ALT 22  ALKPHOS  64  BILITOT 1.1   PT/INR No results for input(s): LABPROT, INR in the last 72 hours.  Studies/Results: Dg Abd 2 Views  Sep 06, 2014   CLINICAL DATA:  Severe central abdominal pain 6 days. Hysterectomy and bladder sling 6 days ago. Large body habitus.  EXAM: ABDOMEN - 2 VIEW  COMPARISON:  09/01/2014  FINDINGS: There are surgical clips over the right upper quadrant. Bowel gas pattern is nonobstructive as there has been interval resolution of the previously noted air-filled large and small bowel loops. There is no free peritoneal air. There are minimal degenerative changes of the spine, hips and sacroiliac joints. Multiple pelvic phleboliths are unchanged.  IMPRESSION: Nonobstructive bowel gas pattern.   Electronically Signed   By: Marin Olp M.D.   On: 2014/09/06 09:37    Impression: By exam and by x-ray, her ileus has resolved. I believe her abdominal discomfort at this time may be related to fecal obstipation, and in fact, the patient herself feels that that is the case.   Plan: Begin with Fleet enemas from below, in view of the large amount of stool on rectal exam. Agree with milk of magnesia as ordered by Dr. Matthew Saras. I will add Senokot at bedtime. Reassess tomorrow, consider repeat films if not improved.   LOS: 6 days   Idona Stach V  09-06-2014, 11:07 AM   Pager [phone] If no answer or after 5 PM call [phone]

## 2014-09-04 NOTE — Progress Notes (Signed)
[name]  Alert but reports nauseated no pain , refuse pain meds Zofran given,

## 2014-09-04 NOTE — Progress Notes (Signed)
POD 6  S/  tol some PO but c/o nausea and abd bloating  O//BP 150/59 mmHg  Pulse 63  Temp(Src) 98.4 F (36.9 C) (Oral)  Resp 18  Ht 5\' 4"  (1.626 m)  Wt 213 lb (96.616 kg)  BMI 36.54 kg/m2  SpO2 94%  Lungs clear Abd bruising noted , abd some distension but soft, hypoactive BS Inc C/D  A+P// post op ileus, will rpt abd film, check labs, repeat MOM X 1 and consult GI to eval

## 2014-09-05 MED ORDER — FLEET ENEMA 7-19 GM/118ML RE ENEM
1.0000 | ENEMA | Freq: Once | RECTAL | Status: AC
  Administered 2014-09-05: 1 via RECTAL
  Filled 2014-09-05: qty 1

## 2014-09-05 MED ORDER — SUCRALFATE 1 GM/10ML PO SUSP
1.0000 g | Freq: Three times a day (TID) | ORAL | Status: DC
  Administered 2014-09-05 – 2014-09-06 (×4): 1 g via ORAL
  Filled 2014-09-05 (×7): qty 10

## 2014-09-05 MED ORDER — PROMETHAZINE HCL 25 MG/ML IJ SOLN
25.0000 mg | Freq: Once | INTRAMUSCULAR | Status: AC
  Administered 2014-09-05: 25 mg via INTRAVENOUS
  Filled 2014-09-05: qty 1

## 2014-09-05 MED ORDER — PROMETHAZINE HCL 25 MG PO TABS
25.0000 mg | ORAL_TABLET | Freq: Four times a day (QID) | ORAL | Status: DC | PRN
  Administered 2014-09-05: 25 mg via ORAL
  Filled 2014-09-05: qty 1

## 2014-09-05 MED ORDER — POLYETHYLENE GLYCOL 3350 17 G PO PACK
17.0000 g | PACK | Freq: Four times a day (QID) | ORAL | Status: DC
  Administered 2014-09-05 – 2014-09-06 (×4): 17 g via ORAL
  Filled 2014-09-05 (×7): qty 1

## 2014-09-05 MED ORDER — MAGNESIUM HYDROXIDE 400 MG/5ML PO SUSP
30.0000 mL | Freq: Every day | ORAL | Status: DC
  Administered 2014-09-05: 30 mL via ORAL
  Filled 2014-09-05: qty 30

## 2014-09-05 MED ORDER — FAMOTIDINE IN NACL 20-0.9 MG/50ML-% IV SOLN
20.0000 mg | Freq: Two times a day (BID) | INTRAVENOUS | Status: DC
  Administered 2014-09-05 – 2014-09-06 (×3): 20 mg via INTRAVENOUS
  Filled 2014-09-05 (×3): qty 50

## 2014-09-05 MED ORDER — MAGNESIUM HYDROXIDE 400 MG/5ML PO SUSP
60.0000 mL | Freq: Every day | ORAL | Status: AC
  Administered 2014-09-06: 60 mL via ORAL
  Filled 2014-09-05: qty 60

## 2014-09-05 NOTE — Progress Notes (Signed)
PT Cancellation Note  Patient Details Name: [name] MRN: [id number] DOB: 08-27-1960   Cancelled Treatment:    Reason Eval/Treat Not Completed: Attempted PT tx session-pt declined to participate due to still having nausea "If I get up Im gonna throw up." Offered to check back later today, pt stated "there's no need." Will continue to follow and work with pt as able. Thanks.    Weston Anna, MPT Pager: [phone]

## 2014-09-05 NOTE — Progress Notes (Signed)
By the patient's report, she has had modest results from her laxatives, none from yesterday's enemas.  She feels very nauseated, continuously, and is made worse by eating even small amounts of food.  Exam:  The patient is in no acute distress but appears slightly uncomfortable. She is anicteric. The abdomen is obese but nondistended, bowel sounds are normal, no tympany, guarding, mass effect, or tenderness.  No new labs or x-rays for review.  Impression:  1. Fecal obstipation 2. Nausea, which could be the result of bile reflux related to upper tract dysmotility as a consequence of her constipation.  Plan:  1. Continue laxation 2. Trial of sucralfate suspension to bind bile salts, to see if this might help her nausea.  Cleotis Nipper, M.D. Pager [phone] If no answer or after 5 PM call [phone]

## 2014-09-05 NOTE — Progress Notes (Signed)
POD 6 S// had results after enema but still c/o nausea  O//BP 138/60 mmHg  Pulse 50  Temp(Src) 98.9 F (37.2 C) (Oral)  Resp 19  Ht 5\' 4"  (1.626 m)  Wt 213 lb (96.616 kg)  BMI 36.54 kg/m2  SpO2 99%  Lungs clr Abd softer, incr BS  Labs and X ray reviewd,  A//  Post op ileus, gradual improvement  P//repeat MOM, enema, change to Phenergan for nausea , GI to see today

## 2014-09-06 MED ORDER — SIMETHICONE 80 MG PO CHEW
80.0000 mg | CHEWABLE_TABLET | Freq: Four times a day (QID) | ORAL | Status: DC
  Administered 2014-09-06: 80 mg via ORAL
  Filled 2014-09-06 (×4): qty 1

## 2014-09-06 MED ORDER — SUCRALFATE 1 GM/10ML PO SUSP: 1.0000 g | Freq: Three times a day (TID) | ORAL | Status: DC

## 2014-09-06 MED ORDER — PROMETHAZINE HCL 25 MG PO TABS: 25.0000 mg | ORAL_TABLET | Freq: Four times a day (QID) | ORAL | Status: DC | PRN

## 2014-09-06 MED ORDER — SIMETHICONE 80 MG PO CHEW: 80.0000 mg | CHEWABLE_TABLET | Freq: Four times a day (QID) | ORAL | Status: DC

## 2014-09-06 NOTE — Progress Notes (Signed)
POD 7  S/ had some results yest after enema, smalll BM, still c/ o upper abd bloating and gas pain  O// BP 119/50 mmHg  Pulse 51  Temp(Src) 98.9 F (37.2 C) (Oral)  Resp 18  Ht 5\' 4"  (1.626 m)  Wt 213 lb (96.616 kg)  BMI 36.54 kg/m2  SpO2 96%  Lungs clear Abd increased BS, soft Inc, min oozing, no evidence of cellulitis  A+P//post op ileus, slow improvement, will incr ambulation, MOM today, poss D/C later today if improved

## 2014-09-06 NOTE — Progress Notes (Signed)
Feels better, looks better.  Nausea has resolved and the patient feels like she can eat now.  She did have 2 BM's last night, by her report, but they were "small."  Exam:  NAD, abd obese, mod tympany in area of transverse colon but not elsewhere, active BS's.  IMPR:  Improving overall  PLAN:  Agree w/ dischg (pt feels she's ready) assuming she tolerates breakfast.  Cleotis Nipper, M.D. Pager [phone] If no answer or after 5 PM call [phone]

## 2014-09-06 NOTE — Discharge Summary (Signed)
  Sherrill Raring  DICTATION # [phone] CSN# [id number]   Margarette Asal, MD 09/06/2014 1:16 PM

## 2014-09-07 NOTE — H&P (Signed)
NAMEMarland Kitchen  [name], [name]             ACCOUNT NO.:  [id number]  MEDICAL RECORD NO.:  [id number]  LOCATION:  [id number]                         FACILITY:  [facility]  PHYSICIAN:  Ralene Bathe. Matthew Saras, M.D.DATE OF BIRTH:  April 21, 1960  DATE OF ADMISSION:  08/29/2014 DATE OF DISCHARGE:  09/06/2014                             HISTORY & PHYSICAL   DISCHARGE DIAGNOSES: 1. Pelvic pain. 2. Family history of ovarian cancer. 3. Stress urinary incontinence. 4. Laparoscopic-assisted vaginal hysterectomy and bilateral salpingo-     oophorectomy with midurethral sling this admission. 5. Postoperative intraabdominal bleeding, requiring a laparotomy with     suturing of vaginal cuff. 6. Postoperative blood transfusion. 7. Postoperative ileus.  [place] STAY:  The patient was admitted for LAVH-BSO with midurethral sling, which was carried out without difficulty.  In the recovery room, she experienced some hemodynamic instability.  Decision was made to reoperate for presumed intraabdominal bleeding with a drop in her hemoglobin.  That same afternoon, she underwent laparotomy with intraoperative transfusion of 2 units of blood and suturing of a bleeder just right and above the vaginal cuff.  On the first postoperative day, hemoglobin was in the mid 6 range.  Decision was made to proceed with another 2 units of blood transfusion.  Her bleeding stabilized, but she developed postoperative ileus characterized by bloating, decreased bowel sounds and verified by upright abdominal x-ray.  She received conservative therapy, IV fluids, and bowel rest.  Over the course of several days when her ileus failed to resolve, GI was consulted. Abdominal films were repeated that showed improvement in the gas pattern, no evidence of bowel obstruction or free intraabdominal air. Slowly, her GI function returned with both milk of magnesia and Dulcolax suppositories plus fleets enema.  On the day of discharge, she was  afebrile.  Her incision was clean and dry except for some skin bruising.  She had active bowel sounds and was ready for discharge.  CONSULTS:  Ronald Lobo, M.D.  LABORATORY DATA:  CMET on Aug 29, 2014, was normal except for glucose of 150.  Last CBC on Sep 04, 2014; WBC 15.9, hemoglobin 8.9, platelets 300,000.  Surgical pathology returned normal.  Abdominal x-rays initial consistent with ileus, followup showed improvement in the bowel gas pattern.  DISPOSITION:  The patient is discharged to resume her normal medications, new medications include Carafate, Pepcid p.r.n., simethicone p.r.n., Phenergan p.o. 25 mg 1 p.o. q.4-6 hours p.r.n. nausea.  She was advised to not drive for 2 weeks.  No heavy lifting, exercise, or sex for 1 month.  She should report persistent nausea, vomiting or obstipation, redness or drainage from the incision, fever over 101, or increased vaginal bleeding.  Symptoms of UTI reviewed with her also, which she should report.  FOLLOWUP:  Follow up is scheduled in our office within 1 week.  CONDITION:  Good.  ACTIVITY:  Greater increase the importance of ambulation and taking daily either milk of magnesia or MiraLax to avoid further obstipation, discussed with her.     Randall Colden M. Matthew Saras, M.D.     RMH/MEDQ  D:  09/06/2014  T:  09/07/2014  Job:  [id number]

## 2014-10-02 ENCOUNTER — Other Ambulatory Visit: Payer: Self-pay | Admitting: Physician Assistant

## 2014-10-02 DIAGNOSIS — M797 Fibromyalgia: Secondary | ICD-10-CM

## 2014-10-02 DIAGNOSIS — F988 Other specified behavioral and emotional disorders with onset usually occurring in childhood and adolescence: Secondary | ICD-10-CM

## 2014-10-03 MED ORDER — AMPHETAMINE-DEXTROAMPHETAMINE 20 MG PO TABS: 20.0000 mg | ORAL_TABLET | Freq: Two times a day (BID) | ORAL | Status: DC

## 2014-10-03 NOTE — Addendum Note (Signed)
Addended by: Vicie Mutters R on: 10/03/2014 08:14 AM   Modules accepted: Orders

## 2014-10-11 ENCOUNTER — Ambulatory Visit (INDEPENDENT_AMBULATORY_CARE_PROVIDER_SITE_OTHER): Payer: BLUE CROSS/BLUE SHIELD | Admitting: Physician Assistant

## 2014-10-11 ENCOUNTER — Encounter: Payer: Self-pay | Admitting: Physician Assistant

## 2014-10-11 VITALS — BP 122/72 | HR 84 | Temp 97.7°F | Resp 16 | Ht 65.0 in | Wt 208.0 lb

## 2014-10-11 DIAGNOSIS — R7309 Other abnormal glucose: Secondary | ICD-10-CM

## 2014-10-11 DIAGNOSIS — E079 Disorder of thyroid, unspecified: Secondary | ICD-10-CM

## 2014-10-11 DIAGNOSIS — E785 Hyperlipidemia, unspecified: Secondary | ICD-10-CM

## 2014-10-11 DIAGNOSIS — R7303 Prediabetes: Secondary | ICD-10-CM

## 2014-10-11 DIAGNOSIS — E559 Vitamin D deficiency, unspecified: Secondary | ICD-10-CM

## 2014-10-11 DIAGNOSIS — M797 Fibromyalgia: Secondary | ICD-10-CM

## 2014-10-11 DIAGNOSIS — Z79899 Other long term (current) drug therapy: Secondary | ICD-10-CM

## 2014-10-11 DIAGNOSIS — E669 Obesity, unspecified: Secondary | ICD-10-CM

## 2014-10-11 DIAGNOSIS — I10 Essential (primary) hypertension: Secondary | ICD-10-CM

## 2014-10-11 DIAGNOSIS — M5410 Radiculopathy, site unspecified: Secondary | ICD-10-CM

## 2014-10-11 LAB — BASIC METABOLIC PANEL WITH GFR
BUN: 16 mg/dL (ref 6–23)
CO2: 28 mEq/L (ref 19–32)
Calcium: 9.3 mg/dL (ref 8.4–10.5)
Chloride: 105 mEq/L (ref 96–112)
Creat: 0.73 mg/dL (ref 0.50–1.10)
GFR, Est African American: >89 mL/min
GFR, Est Non African American: >89 mL/min
Glucose, Bld: 99 mg/dL (ref 70–99)
Potassium: 4.3 mEq/L (ref 3.5–5.3)
Sodium: 139 mEq/L (ref 135–145)

## 2014-10-11 LAB — CBC WITH DIFFERENTIAL/PLATELET
Basophils Absolute: 0 10*3/uL (ref 0.0–0.1)
Basophils Relative: 0 % (ref 0–1)
Eosinophils Absolute: 0.3 10*3/uL (ref 0.0–0.7)
Eosinophils Relative: 3 % (ref 0–5)
HCT: 37.4 % (ref 36.0–46.0)
Hemoglobin: 12.2 g/dL (ref 12.0–15.0)
Lymphocytes Relative: 27 % (ref 12–46)
Lymphs Abs: 2.5 10*3/uL (ref 0.7–4.0)
MCH: 28.5 pg (ref 26.0–34.0)
MCHC: 32.6 g/dL (ref 30.0–36.0)
MCV: 87.4 fL (ref 78.0–100.0)
MPV: 9.6 fL (ref 8.6–12.4)
Monocytes Absolute: 0.5 10*3/uL (ref 0.1–1.0)
Monocytes Relative: 5 % (ref 3–12)
Neutro Abs: 6.1 10*3/uL (ref 1.7–7.7)
Neutrophils Relative %: 65 % (ref 43–77)
Platelets: 326 10*3/uL (ref 150–400)
RBC: 4.28 MIL/uL (ref 3.87–5.11)
RDW: 15.9 % — ABNORMAL HIGH (ref 11.5–15.5)
WBC: 9.4 10*3/uL (ref 4.0–10.5)

## 2014-10-11 LAB — HEPATIC FUNCTION PANEL
ALT: 48 U/L — ABNORMAL HIGH (ref 0–35)
AST: 41 U/L — ABNORMAL HIGH (ref 0–37)
Albumin: 4.1 g/dL (ref 3.5–5.2)
Alkaline Phosphatase: 90 U/L (ref 39–117)
Bilirubin, Direct: 0.1 mg/dL (ref 0.0–0.3)
Indirect Bilirubin: 0.3 mg/dL (ref 0.2–1.2)
Total Bilirubin: 0.4 mg/dL (ref 0.2–1.2)
Total Protein: 6.6 g/dL (ref 6.0–8.3)

## 2014-10-11 LAB — LIPID PANEL
Cholesterol: 186 mg/dL (ref 0–200)
HDL: 49 mg/dL (ref >=46)
LDL Cholesterol: 115 mg/dL — ABNORMAL HIGH (ref 0–99)
Total CHOL/HDL Ratio: 3.8 Ratio
Triglycerides: 111 mg/dL (ref <150)
VLDL: 22 mg/dL (ref 0–40)

## 2014-10-11 LAB — HEMOGLOBIN A1C
Hgb A1c MFr Bld: 5.8 % — ABNORMAL HIGH (ref <5.7)
Mean Plasma Glucose: 120 mg/dL — ABNORMAL HIGH (ref <117)

## 2014-10-11 LAB — MAGNESIUM: Magnesium: 2.1 mg/dL (ref 1.5–2.5)

## 2014-10-11 LAB — TSH: TSH: 3.228 u[IU]/mL (ref 0.350–4.500)

## 2014-10-11 MED ORDER — GABAPENTIN 300 MG PO CAPS: 300.0000 mg | ORAL_CAPSULE | Freq: Three times a day (TID) | ORAL | Status: DC

## 2014-10-11 MED ORDER — ESCITALOPRAM OXALATE 20 MG PO TABS: 20.0000 mg | ORAL_TABLET | Freq: Every day | ORAL | Status: DC

## 2014-10-11 MED ORDER — TRIAMTERENE-HCTZ 37.5-25 MG PO TABS: 1.0000 | ORAL_TABLET | Freq: Every day | ORAL | Status: DC

## 2014-10-11 NOTE — Progress Notes (Signed)
Assessment and Plan:  1. Hypertension -Continue medication, added maxide to help with swelling, take QOD, monitor blood pressure at home. Continue DASH diet.  Reminder to go to the ER if any CP, SOB, nausea, dizziness, severe HA, changes vision/speech, left arm numbness and tingling and jaw pain.  2. Cholesterol -Continue diet and exercise. Check cholesterol.   3. Prediabetes  -Continue diet and exercise. Check A1C  4. Vitamin D Def - check level and continue medications.   5. Hypothyroidism -check TSH level, continue medications the same, reminded to take on an empty stomach 30-3mins before food.   6. Obesity with co morbidities - long discussion about weight loss, diet, and exercise  7. Post op anemia Recheck labs  8. Anxiety/FM/OCD/Estrogen def Given counselors numbers, will switch to lexapro 20mg , follow up PRN  Continue diet and meds as discussed. Further disposition pending results of labs. Over 30 minutes of exam, counseling, chart review, and critical decision making was performed  Medications Discontinued During This Encounter  Medication Reason  . benzonatate (TESSALON PERLES) 100 MG capsule   . fluticasone (FLONASE) 50 MCG/ACT nasal spray   . levofloxacin (LEVAQUIN) 500 MG tablet   . Magnesium 250 MG TABS Dose change  . montelukast (SINGULAIR) 10 MG tablet   . ondansetron (ZOFRAN) 8 MG tablet   . oxybutynin (DITROPAN) 5 MG tablet   . simethicone (MYLICON) 80 MG chewable tablet   . sucralfate (CARAFATE) 1 GM/10ML suspension   . promethazine (PHENERGAN) 25 MG tablet   . pregabalin (LYRICA) 75 MG capsule   . fluvoxaMINE (LUVOX) 100 MG tablet   . gabapentin (NEURONTIN) 300 MG capsule Reorder    HPI 54 y.o. female  presents for 3 month follow up on hypertension, cholesterol, prediabetes, and vitamin D deficiency.   Her blood pressure has been controlled at home, today their BP is BP: 122/72 mmHg  She does not workout. She denies chest pain, shortness of breath,  dizziness.  She is not on cholesterol medication and denies myalgias. Her cholesterol is not at goal. The cholesterol last visit was:   Lab Results  Component Value Date   CHOL 196 07/05/2014   HDL 40* 07/05/2014   LDLCALC 126* 07/05/2014   TRIG 152* 07/05/2014   CHOLHDL 4.9 07/05/2014    She has been working on diet and exercise for prediabetes, and denies paresthesia of the feet, polydipsia, polyuria and visual disturbances. Last A1C in the office was:  Lab Results  Component Value Date   HGBA1C 6.0* 07/05/2014   Patient is on Vitamin D supplement.   Lab Results  Component Value Date   VD25OH 74 07/05/2014     She is on thyroid medication. Her medication was not changed last visit but she has not been taking it since she has been out of the hospital due nause. Lab Results  Component Value Date   TSH 1.923 07/05/2014   She has a history of bladder cancer and after continuing AB pain, had recent LAVH and BSO with subsequent anemia and illeus. She also had some worsening swelling and was put on maxide in the hospital which is helping. She also admits to having worsening hot flashes and anger since having the BSO.  Lab Results  Component Value Date   WBC 15.9* 09/04/2014   HGB 8.9* 09/04/2014   HCT 26.8* 09/04/2014   MCV 90.8 09/04/2014   PLT 300 09/04/2014   BMI is Body mass index is 34.61 kg/(m^2)., she is working on diet and exercise.  Wt Readings from Last 3 Encounters:  10/11/14 208 lb (94.348 kg)  08/29/14 213 lb (96.616 kg)  07/15/14 213 lb (96.616 kg)    Current Medications:  Current Outpatient Prescriptions on File Prior to Visit  Medication Sig Dispense Refill  . albuterol (VENTOLIN HFA) 108 (90 BASE) MCG/ACT inhaler Inhale 2 puffs into the lungs every 4 (four) hours as needed for wheezing or shortness of breath. 1 Inhaler 3  . amphetamine-dextroamphetamine (ADDERALL) 20 MG tablet Take 1 tablet (20 mg total) by mouth 2 (two) times daily. 60 tablet 0  .  cholecalciferol (VITAMIN D) 1000 UNITS tablet Take 7,000 Units by mouth at bedtime.     . clonazePAM (KLONOPIN) 2 MG tablet Take 1 tablet (2 mg total) by mouth 2 (two) times daily. 60 tablet 0  . ferrous sulfate 325 (65 FE) MG tablet Take 325 mg by mouth at bedtime.    . fluvoxaMINE (LUVOX) 100 MG tablet TAKE ONE TABLET BY MOUTH AT BEDTIME (Patient taking differently: Take 100 mg by mouth at bedtime.) 90 tablet 1  . gabapentin (NEURONTIN) 300 MG capsule Take 1 capsule (300 mg total) by mouth 3 (three) times daily. 90 capsule 2  . ibuprofen (ADVIL,MOTRIN) 800 MG tablet Take 1 tablet (800 mg total) by mouth every 8 (eight) hours as needed (mild pain). 30 tablet 0  . levothyroxine (SYNTHROID, LEVOTHROID) 75 MCG tablet Take 1 tablet (75 mcg total) by mouth daily before breakfast. 90 tablet 0  . oxyCODONE-acetaminophen (PERCOCET/ROXICET) 5-325 MG per tablet Take 1-2 tablets by mouth every 4 (four) hours as needed for severe pain (moderate to severe pain (when tolerating fluids)). 20 tablet 0  . pregabalin (LYRICA) 75 MG capsule Take 1 capsule (75 mg total) by mouth 2 (two) times daily. 60 capsule 2  . promethazine (PHENERGAN) 25 MG tablet Take 1 tablet (25 mg total) by mouth every 6 (six) hours as needed for nausea. 30 tablet 1  . sucralfate (CARAFATE) 1 GM/10ML suspension Take 10 mLs (1 g total) by mouth 4 (four) times daily -  with meals and at bedtime. 420 mL 0   No current facility-administered medications on file prior to visit.   Medical History:  Past Medical History  Diagnosis Date  . Fibromyalgia   . PONV (postoperative nausea and vomiting)     used a scop patch last surg-did well  . Hyperlipidemia   . COPD (chronic obstructive pulmonary disease)   . Prediabetes   . Restless leg syndrome   . Hypertension     normal now, only took medications for 1 month- thinks it was from pain.  Marland Kitchen Hypothyroidism   . Hemorrhoid   . Anxiety   . Depression   . ADD (attention deficit disorder)   .  Bladder neoplasm   . History of melanoma excision     leg  . Iron deficiency anemia   . Cancer 2015    Bladder cancer  . Shortness of breath dyspnea     with exertion  . H/O bronchitis     treated within the last week w steroids, antibx tessalon pearls.   . SUI (stress urinary incontinence, female)   . Fibroids   . Wears glasses   . H/O sebaceous cyst 07/18/14    left breast    Allergies:  Allergies  Allergen Reactions  . Sulfa Antibiotics Hives and Itching     Review of Systems:  Review of Systems  Constitutional: Positive for malaise/fatigue. Negative for fever, chills, weight loss and  diaphoresis.  HENT: Negative.   Respiratory: Negative.   Cardiovascular: Negative.   Gastrointestinal: Negative.   Genitourinary: Negative.   Musculoskeletal: Positive for myalgias, back pain and joint pain. Negative for falls and neck pain.  Skin: Negative.   Neurological: Positive for dizziness. Negative for tingling, tremors, sensory change, speech change, focal weakness, seizures, loss of consciousness and weakness.  Psychiatric/Behavioral: Positive for depression. Negative for suicidal ideas, hallucinations, memory loss and substance abuse. The patient is nervous/anxious. The patient does not have insomnia.     Family history- Review and unchanged Social history- Review and unchanged Physical Exam: BP 122/72 mmHg  Pulse 84  Temp(Src) 97.7 F (36.5 C)  Resp 16  Ht 5\' 5"  (1.651 m)  Wt 208 lb (94.348 kg)  BMI 34.61 kg/m2 Wt Readings from Last 3 Encounters:  10/11/14 208 lb (94.348 kg)  08/29/14 213 lb (96.616 kg)  07/15/14 213 lb (96.616 kg)   General Appearance: Well nourished, in no apparent distress. Eyes: PERRLA, EOMs, conjunctiva no swelling or erythema Sinuses: No Frontal/maxillary tenderness ENT/Mouth: Ext aud canals clear, TMs without erythema, bulging. No erythema, swelling, or exudate on post pharynx.  Tonsils not swollen or erythematous. Hearing normal.  Neck:  Supple, thyroid normal.  Respiratory: Respiratory effort normal, BS equal bilaterally without rales, rhonchi, wheezing or stridor.  Cardio: RRR with no MRGs. Brisk peripheral pulses without edema.  Abdomen: Soft, + BS,  Non tender, no guarding, rebound, hernias, masses. Lymphatics: Non tender without lymphadenopathy.  Musculoskeletal: Full ROM, 5/5 strength, Normal gait Skin: Warm, dry without rashes, lesions, ecchymosis.  Neuro: Cranial nerves intact. Normal muscle tone, no cerebellar symptoms. Psych: Awake and oriented X 3, normal affect, Insight and Judgment appropriate.    Vicie Mutters, PA-C 8:56 AM [facility]

## 2014-10-11 NOTE — Patient Instructions (Addendum)
The gabapentin is the same as lyrica, other than it can take 1-3 hours to work.   Stop the luvox and start lexapro 20mg  daily for anxiety/mood  Counseling services  [facility] Hours: Monday-Thursday 830-8pm  Friday 830AM-7PM Address: [place] [phone].  Address: [place], [place] [address] [place] [facility] [phone] office [email or web address] [address] [place], [place], [place] [address]  Rema Fendt, therapist  Toy Cookey, MA, clinical psychologist  Cognitive-Behavior Therapy; Mood Disorders; Anxiety Disorders; adult and child ADHD; Family Therapy; Stress Management; personal growth, and Marital Therapy.    Terrance Mass Ph.D., clinical psychologist Cognitive-Behavior Therapy; Mood Disorders; Anxiety Disorders; Stress     Management  [facility] [address], [place], [place] [address] [phone]   [facility] [place], psychotherapist [address] [place], [place] [address] [phone]  Karin Golden Ph.D., clinical psychologist [phone] office [place], [place] [address] Cognitive Behavior Therapy, Depression, Bipolar, Anxiety, Grief and Loss

## 2014-10-12 LAB — VITAMIN D 25 HYDROXY (VIT D DEFICIENCY, FRACTURES): Vit D, 25-Hydroxy: 69 ng/mL (ref 30–100)

## 2014-11-10 DIAGNOSIS — R7989 Other specified abnormal findings of blood chemistry: Secondary | ICD-10-CM | POA: Diagnosis not present

## 2014-11-10 DIAGNOSIS — Z79899 Other long term (current) drug therapy: Secondary | ICD-10-CM | POA: Diagnosis not present

## 2014-11-11 ENCOUNTER — Other Ambulatory Visit (INDEPENDENT_AMBULATORY_CARE_PROVIDER_SITE_OTHER): Payer: BLUE CROSS/BLUE SHIELD

## 2014-11-11 DIAGNOSIS — R945 Abnormal results of liver function studies: Secondary | ICD-10-CM

## 2014-11-11 DIAGNOSIS — R7989 Other specified abnormal findings of blood chemistry: Secondary | ICD-10-CM

## 2014-11-11 DIAGNOSIS — Z79899 Other long term (current) drug therapy: Secondary | ICD-10-CM

## 2014-11-11 DIAGNOSIS — M797 Fibromyalgia: Secondary | ICD-10-CM

## 2014-11-11 DIAGNOSIS — F988 Other specified behavioral and emotional disorders with onset usually occurring in childhood and adolescence: Secondary | ICD-10-CM

## 2014-11-11 LAB — CBC WITH DIFFERENTIAL/PLATELET
Basophils Absolute: 0 10*3/uL (ref 0.0–0.1)
Basophils Relative: 0 % (ref 0–1)
Eosinophils Absolute: 0.2 10*3/uL (ref 0.0–0.7)
Eosinophils Relative: 2 % (ref 0–5)
HCT: 40.8 % (ref 36.0–46.0)
Hemoglobin: 13.5 g/dL (ref 12.0–15.0)
Lymphocytes Relative: 36 % (ref 12–46)
Lymphs Abs: 3.2 10*3/uL (ref 0.7–4.0)
MCH: 28.9 pg (ref 26.0–34.0)
MCHC: 33.1 g/dL (ref 30.0–36.0)
MCV: 87.4 fL (ref 78.0–100.0)
MPV: 9.4 fL (ref 8.6–12.4)
Monocytes Absolute: 0.7 10*3/uL (ref 0.1–1.0)
Monocytes Relative: 8 % (ref 3–12)
Neutro Abs: 4.8 10*3/uL (ref 1.7–7.7)
Neutrophils Relative %: 54 % (ref 43–77)
Platelets: 330 10*3/uL (ref 150–400)
RBC: 4.67 MIL/uL (ref 3.87–5.11)
RDW: 15.6 % — ABNORMAL HIGH (ref 11.5–15.5)
WBC: 8.8 10*3/uL (ref 4.0–10.5)

## 2014-11-11 LAB — BASIC METABOLIC PANEL WITH GFR
BUN: 17 mg/dL (ref 7–25)
CO2: 28 mmol/L (ref 20–31)
Calcium: 9.7 mg/dL (ref 8.6–10.4)
Chloride: 103 mmol/L (ref 98–110)
Creat: 0.74 mg/dL (ref 0.50–1.05)
GFR, Est African American: >89 mL/min (ref >=60)
GFR, Est Non African American: >89 mL/min (ref >=60)
Glucose, Bld: 92 mg/dL (ref 65–99)
Potassium: 4.7 mmol/L (ref 3.5–5.3)
Sodium: 141 mmol/L (ref 135–146)

## 2014-11-11 LAB — HEPATIC FUNCTION PANEL
ALT: 55 U/L — ABNORMAL HIGH (ref 6–29)
AST: 43 U/L — ABNORMAL HIGH (ref 10–35)
Albumin: 4 g/dL (ref 3.6–5.1)
Alkaline Phosphatase: 94 U/L (ref 33–130)
Bilirubin, Direct: 0.1 mg/dL (ref <=0.2)
Indirect Bilirubin: 0.3 mg/dL (ref 0.2–1.2)
Total Bilirubin: 0.4 mg/dL (ref 0.2–1.2)
Total Protein: 6.8 g/dL (ref 6.1–8.1)

## 2014-11-11 MED ORDER — BENZONATATE 100 MG PO CAPS: 100.0000 mg | ORAL_CAPSULE | Freq: Four times a day (QID) | ORAL | Status: DC | PRN

## 2014-11-11 MED ORDER — AMPHETAMINE-DEXTROAMPHETAMINE 20 MG PO TABS: 20.0000 mg | ORAL_TABLET | Freq: Two times a day (BID) | ORAL | Status: DC

## 2014-11-23 ENCOUNTER — Other Ambulatory Visit: Payer: Self-pay | Admitting: Physician Assistant

## 2014-12-19 ENCOUNTER — Other Ambulatory Visit: Payer: Self-pay | Admitting: Physician Assistant

## 2014-12-19 DIAGNOSIS — F988 Other specified behavioral and emotional disorders with onset usually occurring in childhood and adolescence: Secondary | ICD-10-CM

## 2014-12-19 DIAGNOSIS — M797 Fibromyalgia: Secondary | ICD-10-CM

## 2014-12-19 MED ORDER — AMPHETAMINE-DEXTROAMPHETAMINE 20 MG PO TABS: 20.0000 mg | ORAL_TABLET | Freq: Two times a day (BID) | ORAL | Status: DC

## 2014-12-20 ENCOUNTER — Encounter (HOSPITAL_BASED_OUTPATIENT_CLINIC_OR_DEPARTMENT_OTHER): Payer: Self-pay | Admitting: *Deleted

## 2014-12-21 ENCOUNTER — Encounter (HOSPITAL_BASED_OUTPATIENT_CLINIC_OR_DEPARTMENT_OTHER): Payer: Self-pay | Admitting: *Deleted

## 2014-12-21 ENCOUNTER — Ambulatory Visit (HOSPITAL_BASED_OUTPATIENT_CLINIC_OR_DEPARTMENT_OTHER): Payer: BLUE CROSS/BLUE SHIELD | Admitting: Anesthesiology

## 2014-12-21 ENCOUNTER — Encounter (HOSPITAL_BASED_OUTPATIENT_CLINIC_OR_DEPARTMENT_OTHER)
Admission: RE | Disposition: A | Discharge status: Home / Self Care | Payer: Self-pay | Source: Ambulatory Visit | Attending: Orthopedic Surgery

## 2014-12-21 ENCOUNTER — Ambulatory Visit (HOSPITAL_COMMUNITY): Payer: BLUE CROSS/BLUE SHIELD

## 2014-12-21 ENCOUNTER — Other Ambulatory Visit: Payer: Self-pay | Admitting: Orthopedic Surgery

## 2014-12-21 ENCOUNTER — Ambulatory Visit (HOSPITAL_BASED_OUTPATIENT_CLINIC_OR_DEPARTMENT_OTHER)
Admission: RE | Admit: 2014-12-21 | Discharge: 2014-12-22 | Disposition: A | Discharge status: Home / Self Care | Payer: BLUE CROSS/BLUE SHIELD | Source: Ambulatory Visit | Attending: Orthopedic Surgery | Admitting: Orthopedic Surgery

## 2014-12-21 DIAGNOSIS — F419 Anxiety disorder, unspecified: Secondary | ICD-10-CM | POA: Insufficient documentation

## 2014-12-21 DIAGNOSIS — Z79891 Long term (current) use of opiate analgesic: Secondary | ICD-10-CM | POA: Insufficient documentation

## 2014-12-21 DIAGNOSIS — F329 Major depressive disorder, single episode, unspecified: Secondary | ICD-10-CM | POA: Diagnosis not present

## 2014-12-21 DIAGNOSIS — S43431A Superior glenoid labrum lesion of right shoulder, initial encounter: Secondary | ICD-10-CM | POA: Insufficient documentation

## 2014-12-21 DIAGNOSIS — E785 Hyperlipidemia, unspecified: Secondary | ICD-10-CM | POA: Diagnosis not present

## 2014-12-21 DIAGNOSIS — Z79899 Other long term (current) drug therapy: Secondary | ICD-10-CM | POA: Insufficient documentation

## 2014-12-21 DIAGNOSIS — M797 Fibromyalgia: Secondary | ICD-10-CM | POA: Insufficient documentation

## 2014-12-21 DIAGNOSIS — J449 Chronic obstructive pulmonary disease, unspecified: Secondary | ICD-10-CM | POA: Diagnosis not present

## 2014-12-21 DIAGNOSIS — F988 Other specified behavioral and emotional disorders with onset usually occurring in childhood and adolescence: Secondary | ICD-10-CM | POA: Insufficient documentation

## 2014-12-21 DIAGNOSIS — Z87891 Personal history of nicotine dependence: Secondary | ICD-10-CM | POA: Insufficient documentation

## 2014-12-21 DIAGNOSIS — X58XXXA Exposure to other specified factors, initial encounter: Secondary | ICD-10-CM | POA: Diagnosis not present

## 2014-12-21 DIAGNOSIS — R7309 Other abnormal glucose: Secondary | ICD-10-CM | POA: Insufficient documentation

## 2014-12-21 DIAGNOSIS — I10 Essential (primary) hypertension: Secondary | ICD-10-CM | POA: Diagnosis not present

## 2014-12-21 DIAGNOSIS — E039 Hypothyroidism, unspecified: Secondary | ICD-10-CM | POA: Diagnosis not present

## 2014-12-21 DIAGNOSIS — Z791 Long term (current) use of non-steroidal anti-inflammatories (NSAID): Secondary | ICD-10-CM | POA: Insufficient documentation

## 2014-12-21 DIAGNOSIS — Z8551 Personal history of malignant neoplasm of bladder: Secondary | ICD-10-CM | POA: Insufficient documentation

## 2014-12-21 DIAGNOSIS — M7541 Impingement syndrome of right shoulder: Secondary | ICD-10-CM | POA: Diagnosis not present

## 2014-12-21 DIAGNOSIS — R0902 Hypoxemia: Secondary | ICD-10-CM

## 2014-12-21 DIAGNOSIS — G2581 Restless legs syndrome: Secondary | ICD-10-CM | POA: Insufficient documentation

## 2014-12-21 HISTORY — PX: SHOULDER ARTHROSCOPY WITH DISTAL CLAVICLE RESECTION: SHX5675

## 2014-12-21 HISTORY — PX: SHOULDER ARTHROSCOPY WITH BICEPSTENOTOMY: SHX6204

## 2014-12-21 HISTORY — PX: SHOULDER ARTHROSCOPY WITH SUBACROMIAL DECOMPRESSION: SHX5684

## 2014-12-21 HISTORY — DX: Impingement syndrome of unspecified shoulder: M75.40

## 2014-12-21 HISTORY — DX: Other specified joint disorders, unspecified shoulder: M25.819

## 2014-12-21 LAB — POCT HEMOGLOBIN-HEMACUE: Hemoglobin: 14.7 g/dL (ref 12.0–15.0)

## 2014-12-21 SURGERY — SHOULDER ARTHROSCOPY WITH SUBACROMIAL DECOMPRESSION
Anesthesia: General | Site: Shoulder | Laterality: Right

## 2014-12-21 MED ORDER — ONDANSETRON HCL 4 MG/2ML IJ SOLN
INTRAMUSCULAR | Status: DC | PRN
  Administered 2014-12-21: 4 mg via INTRAVENOUS

## 2014-12-21 MED ORDER — FENTANYL CITRATE (PF) 100 MCG/2ML IJ SOLN
50.0000 ug | INTRAMUSCULAR | Status: AC | PRN
  Administered 2014-12-21: 50 ug via INTRAVENOUS
  Administered 2014-12-21: 100 ug via INTRAVENOUS
  Administered 2014-12-21: 50 ug via INTRAVENOUS

## 2014-12-21 MED ORDER — ALBUTEROL SULFATE (2.5 MG/3ML) 0.083% IN NEBU
INHALATION_SOLUTION | RESPIRATORY_TRACT | Status: AC
  Filled 2014-12-21: qty 3

## 2014-12-21 MED ORDER — SCOPOLAMINE 1 MG/3DAYS TD PT72
1.0000 | MEDICATED_PATCH | Freq: Once | TRANSDERMAL | Status: AC | PRN
  Administered 2014-12-21: 1 via TRANSDERMAL

## 2014-12-21 MED ORDER — LIDOCAINE HCL 4 % MT SOLN
OROMUCOSAL | Status: DC | PRN
  Administered 2014-12-21: 3 mL via TOPICAL

## 2014-12-21 MED ORDER — CHLORHEXIDINE GLUCONATE 4 % EX LIQD: 60.0000 mL | Freq: Once | CUTANEOUS | Status: DC

## 2014-12-21 MED ORDER — LIDOCAINE-EPINEPHRINE (PF) 1.5 %-1:200000 IJ SOLN
INTRAMUSCULAR | Status: DC | PRN
  Administered 2014-12-21: 10 mL via PERINEURAL

## 2014-12-21 MED ORDER — KETOROLAC TROMETHAMINE 30 MG/ML IJ SOLN
INTRAMUSCULAR | Status: AC
  Filled 2014-12-21: qty 1

## 2014-12-21 MED ORDER — DEXAMETHASONE SODIUM PHOSPHATE 4 MG/ML IJ SOLN
INTRAMUSCULAR | Status: DC | PRN
  Administered 2014-12-21: 10 mg via INTRAVENOUS

## 2014-12-21 MED ORDER — ALBUTEROL SULFATE HFA 108 (90 BASE) MCG/ACT IN AERS: 2.0000 | INHALATION_SPRAY | RESPIRATORY_TRACT | Status: DC | PRN

## 2014-12-21 MED ORDER — OXYCODONE-ACETAMINOPHEN 5-325 MG PO TABS: 1.0000 | ORAL_TABLET | Freq: Four times a day (QID) | ORAL | Status: DC | PRN

## 2014-12-21 MED ORDER — GLYCOPYRROLATE 0.2 MG/ML IJ SOLN: 0.2000 mg | Freq: Once | INTRAMUSCULAR | Status: DC | PRN

## 2014-12-21 MED ORDER — ALBUTEROL SULFATE HFA 108 (90 BASE) MCG/ACT IN AERS
INHALATION_SPRAY | RESPIRATORY_TRACT | Status: AC
  Filled 2014-12-21: qty 6.7

## 2014-12-21 MED ORDER — HYDROMORPHONE HCL 1 MG/ML IJ SOLN
0.2500 mg | INTRAMUSCULAR | Status: DC | PRN
  Administered 2014-12-21 (×4): 0.5 mg via INTRAVENOUS

## 2014-12-21 MED ORDER — FUROSEMIDE 40 MG PO TABS: 40.0000 mg | ORAL_TABLET | Freq: Every day | ORAL | Status: DC

## 2014-12-21 MED ORDER — ALBUTEROL SULFATE (2.5 MG/3ML) 0.083% IN NEBU
2.5000 mg | INHALATION_SOLUTION | Freq: Four times a day (QID) | RESPIRATORY_TRACT | Status: DC | PRN
  Administered 2014-12-21: 2.5 mg via RESPIRATORY_TRACT

## 2014-12-21 MED ORDER — HYDROMORPHONE HCL 1 MG/ML IJ SOLN
INTRAMUSCULAR | Status: AC
  Filled 2014-12-21: qty 1

## 2014-12-21 MED ORDER — SUCCINYLCHOLINE CHLORIDE 20 MG/ML IJ SOLN
INTRAMUSCULAR | Status: DC | PRN
  Administered 2014-12-21: 100 mg via INTRAVENOUS

## 2014-12-21 MED ORDER — TRIAMTERENE-HCTZ 37.5-25 MG PO TABS: 1.0000 | ORAL_TABLET | Freq: Every day | ORAL | Status: DC

## 2014-12-21 MED ORDER — ONDANSETRON HCL 4 MG/2ML IJ SOLN: 4.0000 mg | Freq: Four times a day (QID) | INTRAMUSCULAR | Status: DC | PRN

## 2014-12-21 MED ORDER — CEFAZOLIN SODIUM-DEXTROSE 2-3 GM-% IV SOLR
INTRAVENOUS | Status: AC
  Filled 2014-12-21: qty 50

## 2014-12-21 MED ORDER — SCOPOLAMINE 1 MG/3DAYS TD PT72
MEDICATED_PATCH | TRANSDERMAL | Status: AC
  Filled 2014-12-21: qty 1

## 2014-12-21 MED ORDER — BUPIVACAINE HCL (PF) 0.5 % IJ SOLN
INTRAMUSCULAR | Status: DC | PRN
  Administered 2014-12-21: 20 mL via PERINEURAL

## 2014-12-21 MED ORDER — MIDAZOLAM HCL 2 MG/2ML IJ SOLN
1.0000 mg | INTRAMUSCULAR | Status: DC | PRN
  Administered 2014-12-21: 1 mg via INTRAVENOUS

## 2014-12-21 MED ORDER — HYDROMORPHONE HCL 1 MG/ML IJ SOLN
0.5000 mg | INTRAMUSCULAR | Status: DC | PRN
  Administered 2014-12-21: 0.5 mg via INTRAVENOUS

## 2014-12-21 MED ORDER — ONDANSETRON HCL 4 MG PO TABS: 4.0000 mg | ORAL_TABLET | Freq: Four times a day (QID) | ORAL | Status: DC | PRN

## 2014-12-21 MED ORDER — PROPOFOL 10 MG/ML IV BOLUS
INTRAVENOUS | Status: DC | PRN
  Administered 2014-12-21: 160 mg via INTRAVENOUS

## 2014-12-21 MED ORDER — CEFAZOLIN SODIUM-DEXTROSE 2-3 GM-% IV SOLR: 2.0000 g | INTRAVENOUS | Status: DC

## 2014-12-21 MED ORDER — KETOROLAC TROMETHAMINE 30 MG/ML IJ SOLN
30.0000 mg | Freq: Once | INTRAMUSCULAR | Status: AC
  Administered 2014-12-21: 30 mg via INTRAVENOUS

## 2014-12-21 MED ORDER — LACTATED RINGERS IV SOLN
INTRAVENOUS | Status: DC
Start: 2014-12-21 — End: 2014-12-21

## 2014-12-21 MED ORDER — FENTANYL CITRATE (PF) 100 MCG/2ML IJ SOLN
INTRAMUSCULAR | Status: AC
  Filled 2014-12-21: qty 4

## 2014-12-21 MED ORDER — CLONAZEPAM 2 MG PO TABS
2.0000 mg | ORAL_TABLET | Freq: Two times a day (BID) | ORAL | Status: DC
  Administered 2014-12-21: 2 mg via ORAL

## 2014-12-21 MED ORDER — LIDOCAINE HCL (CARDIAC) 20 MG/ML IV SOLN
INTRAVENOUS | Status: DC | PRN
  Administered 2014-12-21: 60 mg via INTRAVENOUS

## 2014-12-21 MED ORDER — MIDAZOLAM HCL 2 MG/2ML IJ SOLN
INTRAMUSCULAR | Status: AC
  Filled 2014-12-21: qty 2

## 2014-12-21 MED ORDER — OXYCODONE-ACETAMINOPHEN 5-325 MG PO TABS
1.0000 | ORAL_TABLET | ORAL | Status: DC | PRN
  Administered 2014-12-21: 1 via ORAL
  Administered 2014-12-21 – 2014-12-22 (×2): 2 via ORAL
  Administered 2014-12-22: 1 via ORAL
  Filled 2014-12-21: qty 2
  Filled 2014-12-21: qty 1
  Filled 2014-12-21: qty 2
  Filled 2014-12-21: qty 1

## 2014-12-21 MED ORDER — LACTATED RINGERS IV SOLN
INTRAVENOUS | Status: DC
  Administered 2014-12-21: 10 mL/h via INTRAVENOUS

## 2014-12-21 MED ORDER — CEFAZOLIN SODIUM-DEXTROSE 2-3 GM-% IV SOLR
2.0000 g | INTRAVENOUS | Status: AC
  Administered 2014-12-21: 2 g via INTRAVENOUS

## 2014-12-21 MED ORDER — PROMETHAZINE HCL 25 MG/ML IJ SOLN: 6.2500 mg | INTRAMUSCULAR | Status: DC | PRN

## 2014-12-21 MED ORDER — HYDROMORPHONE HCL 1 MG/ML IJ SOLN: 0.5000 mg | INTRAMUSCULAR | Status: DC | PRN

## 2014-12-21 MED ORDER — FENTANYL CITRATE (PF) 100 MCG/2ML IJ SOLN
INTRAMUSCULAR | Status: AC
  Filled 2014-12-21: qty 2

## 2014-12-21 SURGICAL SUPPLY — 80 items
APL SKNCLS STERI-STRIP NONHPOA (GAUZE/BANDAGES/DRESSINGS)
BENZOIN TINCTURE PRP APPL 2/3 (GAUZE/BANDAGES/DRESSINGS) IMPLANT
BLADE 4.2CUDA (BLADE) IMPLANT
BLADE SURG 15 STRL LF DISP TIS (BLADE) IMPLANT
BLADE SURG 15 STRL SS (BLADE)
BLADE VORTEX 6.0 (BLADE) ×2 IMPLANT
CANNULA 5.75X71 LONG (CANNULA) IMPLANT
CANNULA TWIST IN 8.25X7CM (CANNULA) IMPLANT
CUTTER MENISCUS  4.2MM (BLADE) ×1
CUTTER MENISCUS 4.2MM (BLADE) ×1 IMPLANT
DECANTER SPIKE VIAL GLASS SM (MISCELLANEOUS) IMPLANT
DRAPE INCISE IOBAN 66X45 STRL (DRAPES) ×2 IMPLANT
DRAPE STERI 35X30 U-POUCH (DRAPES) ×2 IMPLANT
DRAPE SURG 17X23 STRL (DRAPES) ×2 IMPLANT
DRAPE U-SHAPE 47X51 STRL (DRAPES) ×2 IMPLANT
DRAPE U-SHAPE 76X120 STRL (DRAPES) ×4 IMPLANT
DRSG EMULSION OIL 3X3 NADH (GAUZE/BANDAGES/DRESSINGS) ×2 IMPLANT
DRSG PAD ABDOMINAL 8X10 ST (GAUZE/BANDAGES/DRESSINGS) ×4 IMPLANT
DURAPREP 26ML APPLICATOR (WOUND CARE) ×2 IMPLANT
ELECT REM PT RETURN 9FT ADLT (ELECTROSURGICAL) ×2
ELECTRODE REM PT RTRN 9FT ADLT (ELECTROSURGICAL) ×1 IMPLANT
GAUZE SPONGE 4X4 12PLY STRL (GAUZE/BANDAGES/DRESSINGS) ×2 IMPLANT
GLOVE BIO SURGEON STRL SZ 6.5 (GLOVE) ×1 IMPLANT
GLOVE BIOGEL PI IND STRL 7.0 (GLOVE) IMPLANT
GLOVE BIOGEL PI IND STRL 8 (GLOVE) ×2 IMPLANT
GLOVE BIOGEL PI INDICATOR 7.0 (GLOVE) ×2
GLOVE BIOGEL PI INDICATOR 8 (GLOVE) ×2
GLOVE ECLIPSE 7.5 STRL STRAW (GLOVE) ×4 IMPLANT
GOWN STRL REUS W/ TWL LRG LVL3 (GOWN DISPOSABLE) ×1 IMPLANT
GOWN STRL REUS W/ TWL XL LVL3 (GOWN DISPOSABLE) ×1 IMPLANT
GOWN STRL REUS W/TWL LRG LVL3 (GOWN DISPOSABLE) ×2
GOWN STRL REUS W/TWL XL LVL3 (GOWN DISPOSABLE) ×4 IMPLANT
IV NS IRRIG 3000ML ARTHROMATIC (IV SOLUTION) ×4 IMPLANT
MANIFOLD NEPTUNE II (INSTRUMENTS) ×2 IMPLANT
NDL 1/2 CIR CATGUT .05X1.09 (NEEDLE) IMPLANT
NDL HYPO 18GX1.5 BLUNT FILL (NEEDLE) ×1 IMPLANT
NDL SCORPION MULTI FIRE (NEEDLE) IMPLANT
NDL SUT 6 .5 CRC .975X.05 MAYO (NEEDLE) IMPLANT
NEEDLE 1/2 CIR CATGUT .05X1.09 (NEEDLE) IMPLANT
NEEDLE HYPO 18GX1.5 BLUNT FILL (NEEDLE) ×2 IMPLANT
NEEDLE MAYO TAPER (NEEDLE)
NEEDLE SCORPION MULTI FIRE (NEEDLE) IMPLANT
NS IRRIG 1000ML POUR BTL (IV SOLUTION) IMPLANT
PACK ARTHROSCOPY DSU (CUSTOM PROCEDURE TRAY) ×2 IMPLANT
PACK BASIN DAY SURGERY FS (CUSTOM PROCEDURE TRAY) ×2 IMPLANT
PASSER SUT SWANSON 36MM LOOP (INSTRUMENTS) IMPLANT
PENCIL BUTTON HOLSTER BLD 10FT (ELECTRODE) IMPLANT
SET IRRIG Y TYPE TUR BLADDER L (SET/KITS/TRAYS/PACK) ×2 IMPLANT
SLEEVE SCD COMPRESS KNEE MED (MISCELLANEOUS) ×2 IMPLANT
SLING ARM IMMOBILIZER LRG (SOFTGOODS) IMPLANT
SLING ARM LRG ADULT FOAM STRAP (SOFTGOODS) IMPLANT
SLING ARM MED ADULT FOAM STRAP (SOFTGOODS) IMPLANT
SLING ARM XL FOAM STRAP (SOFTGOODS) IMPLANT
SPONGE LAP 4X18 X RAY DECT (DISPOSABLE) IMPLANT
STRIP CLOSURE SKIN 1/2X4 (GAUZE/BANDAGES/DRESSINGS) IMPLANT
SUCTION FRAZIER TIP 10 FR DISP (SUCTIONS) IMPLANT
SUT ETHIBOND 2 OS 4 DA (SUTURE) IMPLANT
SUT ETHILON 4 0 PS 2 18 (SUTURE) IMPLANT
SUT FIBERWIRE #2 38 T-5 BLUE (SUTURE)
SUT MNCRL AB 3-0 PS2 18 (SUTURE) IMPLANT
SUT PDS AB 0 CT 36 (SUTURE) IMPLANT
SUT PDS AB 1 CT  36 (SUTURE)
SUT PDS AB 1 CT 36 (SUTURE) IMPLANT
SUT TICRON 1 T 12 (SUTURE) IMPLANT
SUT TIGER TAPE 7 IN WHITE (SUTURE) IMPLANT
SUT VIC AB 0 CT1 27 (SUTURE)
SUT VIC AB 0 CT1 27XBRD ANBCTR (SUTURE) IMPLANT
SUT VIC AB 1 CT1 27 (SUTURE)
SUT VIC AB 1 CT1 27XBRD ANBCTR (SUTURE) IMPLANT
SUT VIC AB 2-0 SH 27 (SUTURE)
SUT VIC AB 2-0 SH 27XBRD (SUTURE) IMPLANT
SUTURE FIBERWR #2 38 T-5 BLUE (SUTURE) IMPLANT
SYR 5ML LL (SYRINGE) ×2 IMPLANT
TAPE FIBER 2MM 7IN #2 BLUE (SUTURE) IMPLANT
TOWEL OR 17X24 6PK STRL BLUE (TOWEL DISPOSABLE) ×2 IMPLANT
TOWEL OR NON WOVEN STRL DISP B (DISPOSABLE) ×2 IMPLANT
TUBE CONNECTING 20X1/4 (TUBING) IMPLANT
WAND STAR VAC 90 (SURGICAL WAND) ×2 IMPLANT
WATER STERILE IRR 1000ML POUR (IV SOLUTION) ×2 IMPLANT
YANKAUER SUCT BULB TIP NO VENT (SUCTIONS) IMPLANT

## 2014-12-21 NOTE — Progress Notes (Signed)
Asked to evaluate patient for inability to maintain oxygen saturation above 90%. Patient had right sided ISB with probable R phrenic nerve blunting. Patient was wheezing, given albuterol neb. Wheezing diminished but patient c/o still feeling tight. SOB if speaking, not in respiratory distress. Will obtain CXR, and have her admitted for overnight observation with [place] oxygen.

## 2014-12-21 NOTE — Transfer of Care (Signed)
Immediate Anesthesia Transfer of Care Note  Patient: [name]  Procedure(s) Performed: Procedure(s): SHOULDER ARTHROSCOPY WITH SUBACROMIAL DECOMPRESSION (Right) SHOULDER ARTHROSCOPY WITH DISTAL CLAVICLE RESECTION (Right) SHOULDER ARTHROSCOPY WITH BICEPSTENOTOMY (Right)  Patient Location: PACU  Anesthesia Type:GA combined with regional for post-op pain  Level of Consciousness: awake, alert , oriented and patient cooperative  Airway & Oxygen Therapy: Patient Spontanous Breathing and Patient connected to face mask oxygen  Post-op Assessment: Report given to RN and Post -op Vital signs reviewed and stable  Post vital signs: Reviewed and stable  Last Vitals:  Filed Vitals:   12/21/14 0953  BP: 131/59  Pulse: 57  Temp: 36.6 C  Resp: 10    Complications: No apparent anesthesia complications

## 2014-12-21 NOTE — Progress Notes (Signed)
Assisted Dr. Rose with right, ultrasound guided, supraclavicular block. Side rails up, monitors on throughout procedure. See vital signs in flow sheet. Tolerated Procedure well. 

## 2014-12-21 NOTE — Addendum Note (Signed)
Addendum  created 12/21/14 1647 by Myrtie Soman, MD   Modules edited: Clinical Notes   Clinical Notes:  File: [id number]

## 2014-12-21 NOTE — Brief Op Note (Signed)
12/21/2014  12:20 PM  PATIENT:  [name]  54 y.o. female  PRE-OPERATIVE DIAGNOSIS:  right shoulder impingement, a-c joint arthritis  POST-OPERATIVE DIAGNOSIS:  right shoulder impingement acromioclavicular arthritis  PROCEDURE:  Procedure(s): SHOULDER ARTHROSCOPY WITH SUBACROMIAL DECOMPRESSION (Right) SHOULDER ARTHROSCOPY WITH DISTAL CLAVICLE RESECTION (Right) SHOULDER ARTHROSCOPY WITH BICEPSTENOTOMY (Right)  SURGEON:  Surgeon(s) and Role:    * Dorna Leitz, MD - Primary  PHYSICIAN ASSISTANT:   ASSISTANTS: bethune   ANESTHESIA:   general  EBL:  Total I/O In: 900 [I.V.:900] Out: -   BLOOD ADMINISTERED:none  DRAINS: none   LOCAL MEDICATIONS USED:  MARCAINE     SPECIMEN:  No Specimen  DISPOSITION OF SPECIMEN:  N/A  COUNTS:  YES  TOURNIQUET:  * No tourniquets in log *  DICTATION: .Other Dictation: Dictation Number [id number]  PLAN OF CARE: Admit to inpatient   PATIENT DISPOSITION:  PACU - hemodynamically stable.   Delay start of Pharmacological VTE agent (>24hrs) due to surgical blood loss or risk of bleeding: no

## 2014-12-21 NOTE — Anesthesia Postprocedure Evaluation (Signed)
  Anesthesia Post-op Note  Patient: [name]  Procedure(s) Performed: Procedure(s) (LRB): SHOULDER ARTHROSCOPY WITH SUBACROMIAL DECOMPRESSION (Right) SHOULDER ARTHROSCOPY WITH DISTAL CLAVICLE RESECTION (Right) SHOULDER ARTHROSCOPY WITH BICEPSTENOTOMY (Right)  Patient Location: PACU  Anesthesia Type: GA combined with regional for post-op pain  Level of Consciousness: awake and alert   Airway and Oxygen Therapy: Patient Spontanous Breathing  Post-op Pain: mild  Post-op Assessment: Post-op Vital signs reviewed, Patient's Cardiovascular Status Stable, Respiratory Function Stable, Patent Airway and No signs of Nausea or vomiting  Last Vitals:  Filed Vitals:   12/21/14 1300  BP:   Pulse: 63  Temp:   Resp: 20    Post-op Vital Signs: stable   Complications: No apparent anesthesia complications

## 2014-12-21 NOTE — Anesthesia Preprocedure Evaluation (Signed)
Anesthesia Evaluation  Patient identified by MRN, date of birth, ID band Patient awake    Reviewed: Allergy & Precautions, NPO status , Patient's Chart, lab work & pertinent test results  History of Anesthesia Complications (+) PONV  Airway Mallampati: II  TM Distance: >3 FB Neck ROM: Full    Dental no notable dental hx.    Pulmonary shortness of breath and with exertion, COPD,  COPD inhaler, former smoker,    Pulmonary exam normal breath sounds clear to auscultation       Cardiovascular Normal cardiovascular exam Rhythm:Regular Rate:Normal     Neuro/Psych negative neurological ROS  negative psych ROS   GI/Hepatic negative GI ROS, Neg liver ROS,   Endo/Other  negative endocrine ROS  Renal/GU negative Renal ROS  negative genitourinary   Musculoskeletal negative musculoskeletal ROS (+)   Abdominal   Peds negative pediatric ROS (+)  Hematology negative hematology ROS (+)   Anesthesia Other Findings   Reproductive/Obstetrics negative OB ROS                             Anesthesia Physical Anesthesia Plan  ASA: II  Anesthesia Plan: General   Post-op Pain Management: GA combined w/ Regional for post-op pain   Induction: Intravenous  Airway Management Planned: Oral ETT  Additional Equipment:   Intra-op Plan:   Post-operative Plan: Extubation in OR  Informed Consent: I have reviewed the patients History and Physical, chart, labs and discussed the procedure including the risks, benefits and alternatives for the proposed anesthesia with the patient or authorized representative who has indicated his/her understanding and acceptance.   Dental advisory given  Plan Discussed with: CRNA and Surgeon  Anesthesia Plan Comments:         Anesthesia Quick Evaluation

## 2014-12-21 NOTE — Discharge Instructions (Signed)
Discharge Instructions after Arthroscopic Shoulder Surgery ° ° °A sling has been provided for you. You may remove the sling after 72 hours. The sling may be worn for your protection, if you are in a crowd.  °Use ice on the shoulder intermittently over the first 48 hours after surgery.  °Pain medication has been prescribed for you.  °Use your medication liberally over the first 48 hours, and then begin to taper your use. You may take Extra Strength Tylenol or Tylenol only in place of the pain pills. DO NOT take ANY nonsteroidal anti-inflammatory pain medications: Advil, Motrin, Ibuprofen, Aleve, Naproxen, or Naprosyn.  °You may remove your dressing after two days.  °You may shower 5 days after surgery. The incision CANNOT get wet prior to 5 days. Simply allow the water to wash over the site and then pat dry. Do not rub the incision. Make sure your axilla (armpit) is completely dry after showering.  °Take one aspirin a day for 2 weeks after surgery, unless you have an aspirin sensitivity/allergy or asthma.  °Three to 5 times each day you should perform assisted overhead reaching and external rotation (outward turning) exercises with the operative arm. Both exercises should be done with the non-operative arm used as the "therapist arm" while the operative arm remains relaxed. Ten of each exercise should be done three to five times each day. ° ° ° °Overhead reach is helping to lift your stiff arm up as high as it will go. To stretch your overhead reach, lie flat on your back, relax, and grasp the wrist of the tight shoulder with your opposite hand. Using the power in your opposite arm, bring the stiff arm up as far as it is comfortable. Start holding it for ten seconds and then work up to where you can hold it for a count of 30. Breathe slowly and deeply while the arm is moved. Repeat this stretch ten times, trying to help the arm up a little higher each time.  ° ° ° ° ° °External rotation is turning the arm out to  the side while your elbow stays close to your body. External rotation is best stretched while you are lying on your back. Hold a cane, yardstick, broom handle, or dowel in both hands. Bend both elbows to a right angle. Use steady, gentle force from your normal arm to rotate the hand of the stiff shoulder out away from your body. Continue the rotation as far as it will go comfortably, holding it there for a count of 10. Repeat this exercise ten times.  ° ° ° °Please call [phone] during normal business hours or [phone] after hours for any problems. Including the following: ° °- excessive redness of the incisions °- drainage for more than 4 days °- fever of more than 101.5 F ° °*Please note that pain medications will not be refilled after hours or on weekends. ° ° °Regional Anesthesia Blocks ° °1. Numbness or the inability to move the "blocked" extremity may last from 3-48 hours after placement. The length of time depends on the medication injected and your individual response to the medication. If the numbness is not going away after 48 hours, call your surgeon. ° °2. The extremity that is blocked will need to be protected until the numbness is gone and the  Strength has returned. Because you cannot feel it, you will need to take extra care to avoid injury. Because it may be weak, you may have difficulty moving it or using   it. You may not know what position it is in without looking at it while the block is in effect. ° °3. For blocks in the legs and feet, returning to weight bearing and walking needs to be done carefully. You will need to wait until the numbness is entirely gone and the strength has returned. You should be able to move your leg and foot normally before you try and bear weight or walk. You will need someone to be with you when you first try to ensure you do not fall and possibly risk injury. ° °4. Bruising and tenderness at the needle site are common side effects and will resolve in a few  days. ° °5. Persistent numbness or new problems with movement should be communicated to the surgeon or the [facility] ([phone])/ [facility] ([phone]). ° ° °Post Anesthesia Home Care Instructions ° °Activity: °Get plenty of rest for the remainder of the day. A responsible adult should stay with you for 24 hours following the procedure.  °For the next 24 hours, DO NOT: °-Drive a car °-Operate machinery °-Drink alcoholic beverages °-Take any medication unless instructed by your physician °-Make any legal decisions or sign important papers. ° °Meals: °Start with liquid foods such as gelatin or soup. Progress to regular foods as tolerated. Avoid greasy, spicy, heavy foods. If nausea and/or vomiting occur, drink only clear liquids until the nausea and/or vomiting subsides. Call your physician if vomiting continues. ° °Special Instructions/Symptoms: °Your throat may feel dry or sore from the anesthesia or the breathing tube placed in your throat during surgery. If this causes discomfort, gargle with warm salt water. The discomfort should disappear within 24 hours. ° °If you had a scopolamine patch placed behind your ear for the management of post- operative nausea and/or vomiting: ° °1. The medication in the patch is effective for 72 hours, after which it should be removed.  Wrap patch in a tissue and discard in the trash. Wash hands thoroughly with soap and water. °2. You may remove the patch earlier than 72 hours if you experience unpleasant side effects which may include dry mouth, dizziness or visual disturbances. °3. Avoid touching the patch. Wash your hands with soap and water after contact with the patch. °  ° °

## 2014-12-21 NOTE — H&P (Signed)
PREOPERATIVE H&P  Chief Complaint: r shoulder pain  HPI: [name] is a 54 y.o. female who presents for evaluation of r shoulder pain. It has been present for greater than 6 months and has been worsening. She has failed conservative measures. Pain is rated as moderate.  Past Medical History  Diagnosis Date  . Fibromyalgia   . PONV (postoperative nausea and vomiting)     used a scop patch last surg-did well  . Hyperlipidemia   . COPD (chronic obstructive pulmonary disease)   . Prediabetes   . Restless leg syndrome   . Hypertension     normal now, only took medications for 1 month- thinks it was from pain.  Marland Kitchen Hypothyroidism   . Hemorrhoid   . Anxiety   . Depression   . ADD (attention deficit disorder)   . Bladder neoplasm   . History of melanoma excision     leg  . Iron deficiency anemia   . Cancer 2015    Bladder cancer  . Shortness of breath dyspnea     with exertion  . H/O bronchitis     treated within the last week w steroids, antibx tessalon pearls.   . SUI (stress urinary incontinence, female)   . Fibroids   . Wears glasses   . H/O sebaceous cyst 07/18/14    left breast   . Shoulder impingement     right   Past Surgical History  Procedure Laterality Date  . Ankle fusion Right 2011  . Knee arthroscopy with medial menisectomy Right 05/22/2012    Procedure: KNEE ARTHROSCOPY WITH MEDIAL MENISECTOMY and Medial Plica excision;  Surgeon: Alta Corning, MD;  Location: [place];  Service: Orthopedics;  Laterality: Right;  . Anterior cervical decomp/discectomy fusion N/A 09/01/2013    Procedure: Anterior cervical decompression fusion, cervical 5-6 with instrumentation and allograft;  Surgeon: Sinclair Ship, MD;  Location: [place];  Service: Orthopedics;  Laterality: N/A;  Anterior cervical decompression fusion, cervical 5-6 with instrumentation and allograft  . Shoulder arthroscopy with subacromial decompression Left 11/10/2013    Procedure: SHOULDER  ARTHROSCOPY WITH SUBACROMIAL DECOMPRESSION WITH ROTATOR CUFF REPAIR AND BICEPS TENODESIS;  Surgeon: Alta Corning, MD;  Location: [place];  Service: Orthopedics;  Laterality: Left;  . Closed reduction and pinning left fifth  proximal phalangeal fx  10-09-2005  . D & c hysteroscopy with novasure endometrial ablation  07-16-2006  . Shoulder arthroscopy w/ subacromial decompression and distal clavicle excision Left 11-27-2011    W/  DEBRIDEMENT  . Cataract extraction w/ intraocular lens implant Bilateral   . Tonsillectomy  age 36  . Laparoscopic cholecystectomy  2005  . Tubal ligation  1980  . Transurethral resection of bladder tumor N/A 01/11/2014    Procedure: TRANSURETHRAL RESECTION OF BLADDER TUMOR (TURBT);  Surgeon: Festus Aloe, MD;  Location: [facility];  Service: Urology;  Laterality: N/A;  . Cystoscopy w/ retrogrades Left 01/11/2014    Procedure: CYSTOSCOPY WITH RETROGRADE PYELOGRAM;  Surgeon: Festus Aloe, MD;  Location: [facility];  Service: Urology;  Laterality: Left;  . Cystoscopy with stent placement Left 01/11/2014    Procedure: CYSTOSCOPY WITH STENT PLACEMENT;  Surgeon: Festus Aloe, MD;  Location: [facility];  Service: Urology;  Laterality: Left;  . Laparoscopic assisted vaginal hysterectomy N/A 08/29/2014    Procedure: LAPAROSCOPIC ASSISTED VAGINAL HYSTERECTOMY;  Surgeon: Molli Posey, MD;  Location: [facility];  Service: Gynecology;  Laterality: N/A;  . Salpingoophorectomy  Bilateral 08/29/2014    Procedure: SALPINGO OOPHORECTOMY;  Surgeon: Molli Posey, MD;  Location: [facility];  Service: Gynecology;  Laterality: Bilateral;  . Pubovaginal sling N/A 08/29/2014    Procedure: Okey Dupre SINGLE INCISION SLING;  Surgeon: Molli Posey, MD;  Location[facility];  Service: Gynecology;  Laterality: N/A;  . Laparotomy N/A 08/29/2014    Procedure: EXPLORATORY  LAPAROTOMY WITH LIGATION OF BLEEDER;  Surgeon: Molli Posey, MD;  Location: [facility];  Service: Gynecology;  Laterality: N/A;   Social History   Social History  . Marital Status: Married    Spouse Name: N/A  . Number of Children: 2  . Years of Education: N/A   Occupational History  . logistic specialist    Social History Main Topics  . Smoking status: Former Smoker -- 1.00 packs/day for 30 years    Types: Cigarettes    Quit date: 06/24/2009  . Smokeless tobacco: Never Used  . Alcohol Use: No  . Drug Use: No  . Sexual Activity: Not Asked   Other Topics Concern  . None   Social History Narrative   Family History  Problem Relation Age of Onset  . Cancer Father     Lung and stomach   . Heart disease Father   . Hypertension Father   . Stroke Father   . Heart failure Paternal Grandfather   . Cancer Paternal Grandfather     colon cancer  . Mental retardation Son   . Ovarian cancer Mother 80  . Breast cancer Mother     dx in her late 87s-50s  . Thyroid cancer Mother   . Ovarian cancer Sister 25  . Cancer Brother     abdominal tumor  . Ovarian cancer Maternal Aunt   . Breast cancer Maternal Aunt   . Breast cancer Maternal Uncle   . Breast cancer Paternal Aunt   . Ovarian cancer Sister 74  . Ovarian cancer Sister 71  . Cancer Brother     abdominal tumor  . Breast cancer Paternal Aunt   . Breast cancer Paternal Aunt   . Breast cancer Paternal Aunt   . Breast cancer Cousin   . Breast cancer Cousin    Allergies  Allergen Reactions  . Sulfa Antibiotics Hives and Itching   Prior to Admission medications   Medication Sig Start Date End Date Taking? Authorizing Provider  albuterol (VENTOLIN HFA) 108 (90 BASE) MCG/ACT inhaler Inhale 2 puffs into the lungs every 4 (four) hours as needed for wheezing or shortness of breath. 07/05/14  Yes Vicie Mutters, PA-C  amphetamine-dextroamphetamine (ADDERALL) 20 MG tablet Take 1 tablet (20 mg total) by mouth 2 (two) times daily.  12/19/14  Yes Vicie Mutters, PA-C  benzonatate (TESSALON PERLES) 100 MG capsule Take 1 capsule (100 mg total) by mouth every 6 (six) hours as needed for cough. 11/11/14  Yes Vicie Mutters, PA-C  cholecalciferol (VITAMIN D) 1000 UNITS tablet Take 7,000 Units by mouth at bedtime.    Yes Historical Provider, MD  clonazePAM (KLONOPIN) 2 MG tablet Take 1 tablet (2 mg total) by mouth 2 (two) times daily. 06/21/14  Yes Unk Pinto, MD  escitalopram (LEXAPRO) 20 MG tablet Take 1 tablet (20 mg total) by mouth daily. 10/11/14 10/11/15 Yes Vicie Mutters, PA-C  furosemide (LASIX) 40 MG tablet TAKE ONE-HALF TO ONE TABLET BY MOUTH ONCE DAILY 11/23/14  Yes Unk Pinto, MD  gabapentin (NEURONTIN) 300 MG capsule Take 1 capsule (300 mg total) by mouth 3 (three) times daily.  10/11/14  Yes Vicie Mutters, PA-C  ibuprofen (ADVIL,MOTRIN) 800 MG tablet Take 1 tablet (800 mg total) by mouth every 8 (eight) hours as needed (mild pain). 09/03/14  Yes Molli Posey, MD  levothyroxine (SYNTHROID, LEVOTHROID) 75 MCG tablet Take 1 tablet (75 mcg total) by mouth daily before breakfast. 07/05/14  Yes Vicie Mutters, PA-C  MAGNESIUM PO Take 450 mg by mouth daily.   Yes Historical Provider, MD  oxyCODONE-acetaminophen (PERCOCET/ROXICET) 5-325 MG per tablet Take 1-2 tablets by mouth every 4 (four) hours as needed for severe pain (moderate to severe pain (when tolerating fluids)). 09/03/14  Yes Molli Posey, MD  triamterene-hydrochlorothiazide (MAXZIDE-25) 37.5-25 MG per tablet Take 1 tablet by mouth daily. 10/11/14  Yes Vicie Mutters, PA-C     Positive ROS: none  All other systems have been reviewed and were otherwise negative with the exception of those mentioned in the HPI and as above.  Physical Exam: Filed Vitals:   12/21/14 0953  BP: 131/59  Pulse: 57  Temp: 97.9 F (36.6 C)  Resp: 10    General: Alert, no acute distress Cardiovascular: No pedal edema Respiratory: No cyanosis, no use of accessory musculature GI:  No organomegaly, abdomen is soft and non-tender Skin: No lesions in the area of chief complaint Neurologic: Sensation intact distally Psychiatric: Patient is competent for consent with normal mood and affect Lymphatic: No axillary or cervical lymphadenopathy  MUSCULOSKELETAL: r shoulder: painful rom// no instability//+impingement  MRI-RC inflammation withouit tear Assessment/Plan: right shoulder impingement, a-c joint arthritis Plan for Procedure(s): SHOULDER ARTHROSCOPY WITH SUBACROMIAL DECOMPRESSION  The risks benefits and alternatives were discussed with the patient including but not limited to the risks of nonoperative treatment, versus surgical intervention including infection, bleeding, nerve injury, malunion, nonunion, hardware prominence, hardware failure, need for hardware removal, blood clots, cardiopulmonary complications, morbidity, mortality, among others, and they were willing to proceed.  Predicted outcome is good, although there will be at least a six to nine month expected recovery.  Genesis Paget L, MD 12/21/2014 11:00 AM

## 2014-12-21 NOTE — Anesthesia Procedure Notes (Addendum)
Anesthesia Regional Block:  Interscalene brachial plexus block  Pre-Anesthetic Checklist: ,, timeout performed, Correct Patient, Correct Site, Correct Laterality, Correct Procedure, Correct Position, site marked, Risks and benefits discussed,  Surgical consent,  Pre-op evaluation,  At surgeon's request and post-op pain management  Laterality: Right  Prep: chloraprep       Needles:  Injection technique: Single-shot  Needle Type: Echogenic Stimulator Needle     Needle Length: 9cm 9 cm Needle Gauge: 21 and 21 G    Additional Needles:  Procedures: ultrasound guided (picture in chart) Interscalene brachial plexus block Narrative:  Injection made incrementally with aspirations every 5 mL.  Performed by: Personally  Anesthesiologist: ROSE, Iona Beard  Additional Notes: Patient tolerated the procedure well without complications   Procedure Name: Intubation Date/Time: 12/21/2014 11:15 AM Performed by: Maryella Shivers Pre-anesthesia Checklist: Patient identified, Emergency Drugs available, Suction available and Patient being monitored Patient Re-evaluated:Patient Re-evaluated prior to inductionOxygen Delivery Method: Circle System Utilized Preoxygenation: Pre-oxygenation with 100% oxygen Intubation Type: IV induction Ventilation: Mask ventilation without difficulty Laryngoscope Size: Mac and 3 Grade View: Grade I Tube type: Oral Tube size: 7.0 mm Number of attempts: 1 Airway Equipment and Method: Stylet and Oral airway Placement Confirmation: ETT inserted through vocal cords under direct vision,  positive ETCO2 and breath sounds checked- equal and bilateral Secured at: 20 cm Tube secured with: Tape Dental Injury: Teeth and Oropharynx as per pre-operative assessment

## 2014-12-22 ENCOUNTER — Encounter (HOSPITAL_BASED_OUTPATIENT_CLINIC_OR_DEPARTMENT_OTHER): Payer: Self-pay | Admitting: Orthopedic Surgery

## 2014-12-22 DIAGNOSIS — M7541 Impingement syndrome of right shoulder: Secondary | ICD-10-CM | POA: Diagnosis not present

## 2014-12-22 NOTE — Op Note (Signed)
NAME:  Lori, Padilla             ACCOUNT NO.:  [id number]  MEDICAL RECORD NO.:  [id number]  LOCATION:                               FACILITY:  [facility]  PHYSICIAN:  Alta Corning, M.D.   DATE OF BIRTH:  1960-05-30  DATE OF PROCEDURE:  12/21/2014 DATE OF DISCHARGE:  12/22/2014                              OPERATIVE REPORT   PREOPERATIVE DIAGNOSES:  Impingement with acromioclavicular joint arthritis and suspected rotator cuff inflammation.  POSTOPERATIVE DIAGNOSES:  Impingement with acromioclavicular joint arthritis and suspected rotator cuff inflammation, superior labral tear anterior to posterior with impending biceps tendon rupture.  PRINCIPAL PROCEDURES: 1. Arthroscopic subacromial decompression of lateral and posterior     compartment. 2. Arthroscopic distal clavicle resection over 20 mm from an anterior     compartment. 3. Biceps tendon tenotomy. 4. Debridement of superior labral tear anterior to posterior.  SURGEON:  Alta Corning, M.D.  ASSISTANT:  Gary Fleet, P.A.  ANESTHESIA:  General.  BRIEF HISTORY:  [name] is a 54 year old female with long history of significant complaints of right shoulder pain.  She had been treated conservatively for prolonged period of time and after failure of all conservative care, she was taken to the operating room for right shoulder arthroscopy.  Preoperative MRI showed that she had no rotator cuff tear, but impingement and acromioclavicular joint arthritis and she had clinically responded to injection therapy, but had recurrence of symptoms in the right shoulder.  She was brought to the operating room for surgical intervention.  DESCRIPTION OF PROCEDURE:  The patient was brought to the operating room.  After adequate anesthesia was obtained with general anesthetic, the patient was placed supine on the operating table.  The right shoulder was prepped and draped in usual sterile fashion.  Following this, routine arthroscopic  examination of the shoulder revealed that there was biceps tendon, which was severely frayed and superior labral tear anterior to posterior.  We debrided this, and became very clear that the biceps tendon was essentially having an impending rupture and at this point, the biceps tendon was released with a suction shaver.  We then need a biter anything, which showed how close it was towards impending rupture.  With the arm straight and the biceps tendon stayed within the shoulder joints, so, it did not look like it pulled down and we felt that it was probably reasonable just do a tenotomy given her age and the fact that the biceps tendon did not pull free.  At this point, attention was turned towards the rotator cuff on the undersurface where there was noted to have some fray of the supraspinatus, but certainly not anything of any high-grade significance.  At this point, attention was turned out of the glenohumeral joint and into the subacromial space and once that was completed, the CA ligament was released and the subacromial spur was identified.  An aggressive acromioplasty was performed in an anterolateral direction from the undersurface of the acromion.  Once this was completed, attention was turned to the distal clavicle, which was resected over 20 mm from the anterior portal.  Once this was done, attention was turned to the rotator cuff where an aggressive bursectomy was  performed and the rotator cuff from the top side looked pristine.  At this point, the shoulder was copiously and thoroughly lavaged, and suctioned dry.  The portals were closed with bandages.  A 20 mL of 0.25% Marcaine was instilled in the shoulder for postoperative anesthesia.  A sterile compressive dressing was applied. The patient was taken to the recovery room, she was noted to be in satisfactory condition.  Estimated blood loss for the procedure was minimal.     Alta Corning, M.D.     Corliss Skains  D:   12/21/2014  T:  12/22/2014  Job:  [id number]

## 2015-01-13 ENCOUNTER — Encounter: Payer: Self-pay | Admitting: Physician Assistant

## 2015-01-13 ENCOUNTER — Ambulatory Visit (INDEPENDENT_AMBULATORY_CARE_PROVIDER_SITE_OTHER): Payer: BLUE CROSS/BLUE SHIELD | Admitting: Physician Assistant

## 2015-01-13 VITALS — BP 128/80 | HR 80 | Temp 97.2°F | Resp 14 | Ht 65.0 in | Wt 207.0 lb

## 2015-01-13 DIAGNOSIS — M797 Fibromyalgia: Secondary | ICD-10-CM

## 2015-01-13 DIAGNOSIS — E785 Hyperlipidemia, unspecified: Secondary | ICD-10-CM | POA: Diagnosis not present

## 2015-01-13 DIAGNOSIS — F3181 Bipolar II disorder: Secondary | ICD-10-CM

## 2015-01-13 DIAGNOSIS — R7303 Prediabetes: Secondary | ICD-10-CM

## 2015-01-13 DIAGNOSIS — E559 Vitamin D deficiency, unspecified: Secondary | ICD-10-CM

## 2015-01-13 DIAGNOSIS — J449 Chronic obstructive pulmonary disease, unspecified: Secondary | ICD-10-CM

## 2015-01-13 DIAGNOSIS — F988 Other specified behavioral and emotional disorders with onset usually occurring in childhood and adolescence: Secondary | ICD-10-CM

## 2015-01-13 DIAGNOSIS — I10 Essential (primary) hypertension: Secondary | ICD-10-CM | POA: Diagnosis not present

## 2015-01-13 DIAGNOSIS — I700 Atherosclerosis of aorta: Secondary | ICD-10-CM

## 2015-01-13 DIAGNOSIS — Z79899 Other long term (current) drug therapy: Secondary | ICD-10-CM

## 2015-01-13 DIAGNOSIS — Z23 Encounter for immunization: Secondary | ICD-10-CM

## 2015-01-13 DIAGNOSIS — F909 Attention-deficit hyperactivity disorder, unspecified type: Secondary | ICD-10-CM

## 2015-01-13 DIAGNOSIS — E079 Disorder of thyroid, unspecified: Secondary | ICD-10-CM | POA: Diagnosis not present

## 2015-01-13 DIAGNOSIS — E669 Obesity, unspecified: Secondary | ICD-10-CM

## 2015-01-13 DIAGNOSIS — C801 Malignant (primary) neoplasm, unspecified: Secondary | ICD-10-CM

## 2015-01-13 LAB — HEMOGLOBIN A1C
Hgb A1c MFr Bld: 6 % — ABNORMAL HIGH (ref <5.7)
Mean Plasma Glucose: 126 mg/dL — ABNORMAL HIGH (ref <117)

## 2015-01-13 LAB — CBC WITH DIFFERENTIAL/PLATELET
Basophils Absolute: 0 10*3/uL (ref 0.0–0.1)
Basophils Relative: 0 % (ref 0–1)
Eosinophils Absolute: 0.2 10*3/uL (ref 0.0–0.7)
Eosinophils Relative: 2 % (ref 0–5)
HCT: 43.3 % (ref 36.0–46.0)
Hemoglobin: 14.6 g/dL (ref 12.0–15.0)
Lymphocytes Relative: 37 % (ref 12–46)
Lymphs Abs: 3.3 10*3/uL (ref 0.7–4.0)
MCH: 29.4 pg (ref 26.0–34.0)
MCHC: 33.7 g/dL (ref 30.0–36.0)
MCV: 87.1 fL (ref 78.0–100.0)
MPV: 9.8 fL (ref 8.6–12.4)
Monocytes Absolute: 0.6 10*3/uL (ref 0.1–1.0)
Monocytes Relative: 7 % (ref 3–12)
Neutro Abs: 4.9 10*3/uL (ref 1.7–7.7)
Neutrophils Relative %: 54 % (ref 43–77)
Platelets: 342 10*3/uL (ref 150–400)
RBC: 4.97 MIL/uL (ref 3.87–5.11)
RDW: 14.1 % (ref 11.5–15.5)
WBC: 9 10*3/uL (ref 4.0–10.5)

## 2015-01-13 MED ORDER — BENZONATATE 100 MG PO CAPS: 100.0000 mg | ORAL_CAPSULE | Freq: Four times a day (QID) | ORAL | Status: DC | PRN

## 2015-01-13 MED ORDER — AMPHETAMINE-DEXTROAMPHETAMINE 20 MG PO TABS: 20.0000 mg | ORAL_TABLET | Freq: Two times a day (BID) | ORAL | Status: DC

## 2015-01-13 MED ORDER — LEVOTHYROXINE SODIUM 75 MCG PO TABS: 75.0000 ug | ORAL_TABLET | Freq: Every day | ORAL | Status: DC

## 2015-01-13 MED ORDER — ALBUTEROL SULFATE HFA 108 (90 BASE) MCG/ACT IN AERS: 2.0000 | INHALATION_SPRAY | RESPIRATORY_TRACT | Status: DC | PRN

## 2015-01-13 MED ORDER — ESCITALOPRAM OXALATE 20 MG PO TABS: 20.0000 mg | ORAL_TABLET | Freq: Every day | ORAL | Status: DC

## 2015-01-13 NOTE — Progress Notes (Signed)
Assessment and Plan:  1. Hypertension -Continue medication, added maxide to help with swelling, take QOD, monitor blood pressure at home. Continue DASH diet.  Reminder to go to the ER if any CP, SOB, nausea, dizziness, severe HA, changes vision/speech, left arm numbness and tingling and jaw pain.  2. Cholesterol -Continue diet and exercise. Check cholesterol.   3. Prediabetes  -Continue diet and exercise. Check A1C  4. Vitamin D Def - check level and continue medications.   5. Hypothyroidism -check TSH level, continue medications the same, reminded to take on an empty stomach 30-75mins before food.   6. Obesity with co morbidities - long discussion about weight loss, diet, and exercise  7. OSA Probable sleep apnea- get sleep study, weight loss advised.   8. Chronic pain May try to add topamax/switch cymbalta, may need referral pain management.   Continue diet and meds as discussed. Further disposition pending results of labs. Over 30 minutes of exam, counseling, chart review, and critical decision making was performed   HPI 54 y.o. female  presents for 3 month follow up on hypertension, cholesterol, prediabetes, and vitamin D deficiency.   Her blood pressure has been controlled at home, she was started on maxide after her TAH for swelling and she takes it PRN, today their BP is BP: 128/80 mmHg  She does not workout. She denies chest pain, shortness of breath, dizziness.  She is not on cholesterol medication and denies myalgias. Her cholesterol is not at goal. The cholesterol last visit was:   Lab Results  Component Value Date   CHOL 186 10/11/2014   HDL 49 10/11/2014   LDLCALC 115* 10/11/2014   TRIG 111 10/11/2014   CHOLHDL 3.8 10/11/2014    She has been working on diet and exercise for prediabetes, and denies paresthesia of the feet, polydipsia, polyuria and visual disturbances. Last A1C in the office was:  Lab Results  Component Value Date   HGBA1C 5.8* 10/11/2014    Patient is on Vitamin D supplement.   Lab Results  Component Value Date   VD25OH 69 10/11/2014     She is on thyroid medication. Her medication was not changed last visit but she has not been taking it since she has been out of the hospital due nause. Lab Results  Component Value Date   TSH 3.228 10/11/2014   She has a history of bladder cancer and after continuing AB pain, had recent LAVH and BSO. Last visit she was switched from luvox to lexapro for anxiety, depression and hot flashes.  She recently had right shoulder arthroscopy with subacromial decompression with Dr. Berenice Primas in Sept, still doing PT.  She states that her O2 drops during surgeries, denies snoring but states that she will occ wake her self up and she does have COPD. Suggest sleep study.   She has chronic pain, following with Dr. Lynann Bologna and is scheduled to have cervical decompression fusion level 3 on the 19th of this month.   BMI is Body mass index is 34.45 kg/(m^2)., she is working on diet and exercise. Wt Readings from Last 3 Encounters:  01/13/15 207 lb (93.895 kg)  12/21/14 209 lb (94.802 kg)  10/11/14 208 lb (94.348 kg)    Current Medications:  Current Outpatient Prescriptions on File Prior to Visit  Medication Sig Dispense Refill  . albuterol (VENTOLIN HFA) 108 (90 BASE) MCG/ACT inhaler Inhale 2 puffs into the lungs every 4 (four) hours as needed for wheezing or shortness of breath. 1 Inhaler 3  .  amphetamine-dextroamphetamine (ADDERALL) 20 MG tablet Take 1 tablet (20 mg total) by mouth 2 (two) times daily. 60 tablet 0  . benzonatate (TESSALON PERLES) 100 MG capsule Take 1 capsule (100 mg total) by mouth every 6 (six) hours as needed for cough. 60 capsule 0  . cholecalciferol (VITAMIN D) 1000 UNITS tablet Take 7,000 Units by mouth at bedtime.     . clonazePAM (KLONOPIN) 2 MG tablet Take 1 tablet (2 mg total) by mouth 2 (two) times daily. 60 tablet 0  . escitalopram (LEXAPRO) 20 MG tablet Take 1 tablet (20  mg total) by mouth daily. 30 tablet 2  . furosemide (LASIX) 40 MG tablet TAKE ONE-HALF TO ONE TABLET BY MOUTH ONCE DAILY 30 tablet 5  . gabapentin (NEURONTIN) 300 MG capsule Take 1 capsule (300 mg total) by mouth 3 (three) times daily. 90 capsule 2  . ibuprofen (ADVIL,MOTRIN) 800 MG tablet Take 1 tablet (800 mg total) by mouth every 8 (eight) hours as needed (mild pain). 30 tablet 0  . levothyroxine (SYNTHROID, LEVOTHROID) 75 MCG tablet Take 1 tablet (75 mcg total) by mouth daily before breakfast. 90 tablet 0  . MAGNESIUM PO Take 450 mg by mouth daily.    Marland Kitchen oxyCODONE-acetaminophen (PERCOCET/ROXICET) 5-325 MG per tablet Take 1-2 tablets by mouth every 6 (six) hours as needed for severe pain (moderate to severe pain (when tolerating fluids)). 40 tablet 0  . triamterene-hydrochlorothiazide (MAXZIDE-25) 37.5-25 MG per tablet Take 1 tablet by mouth daily. 90 tablet 0   No current facility-administered medications on file prior to visit.   Medical History:  Past Medical History  Diagnosis Date  . Fibromyalgia   . PONV (postoperative nausea and vomiting)     used a scop patch last surg-did well  . Hyperlipidemia   . COPD (chronic obstructive pulmonary disease) ([place])   . Prediabetes   . Restless leg syndrome   . Hypertension     normal now, only took medications for 1 month- thinks it was from pain.  Marland Kitchen Hypothyroidism   . Hemorrhoid   . Anxiety   . Depression   . ADD (attention deficit disorder)   . Bladder neoplasm   . History of melanoma excision     leg  . Iron deficiency anemia   . Cancer [facility]) 2015    Bladder cancer  . Shortness of breath dyspnea     with exertion  . H/O bronchitis     treated within the last week w steroids, antibx tessalon pearls.   . SUI (stress urinary incontinence, female)   . Fibroids   . Wears glasses   . H/O sebaceous cyst 07/18/14    left breast   . Shoulder impingement     right   Allergies:  Allergies  Allergen Reactions  . Sulfa Antibiotics  Hives and Itching     Review of Systems:  Review of Systems  Constitutional: Positive for malaise/fatigue. Negative for fever, chills, weight loss and diaphoresis.  HENT: Negative.   Respiratory: Negative.   Cardiovascular: Negative.   Gastrointestinal: Negative.   Genitourinary: Negative.   Musculoskeletal: Positive for myalgias, back pain and joint pain. Negative for falls and neck pain.  Skin: Negative.   Neurological: Positive for dizziness. Negative for tingling, tremors, sensory change, speech change, focal weakness, seizures, loss of consciousness and weakness.  Psychiatric/Behavioral: Positive for depression. Negative for suicidal ideas, hallucinations, memory loss and substance abuse. The patient is nervous/anxious. The patient does not have insomnia.     Family history-  Review and unchanged Social history- Review and unchanged Physical Exam: BP 128/80 mmHg  Pulse 80  Temp(Src) 97.2 F (36.2 C) (Temporal)  Resp 14  Ht 5\' 5"  (1.651 m)  Wt 207 lb (93.895 kg)  BMI 34.45 kg/m2  SpO2 99% Wt Readings from Last 3 Encounters:  01/13/15 207 lb (93.895 kg)  12/21/14 209 lb (94.802 kg)  10/11/14 208 lb (94.348 kg)   General Appearance: Well nourished, in no apparent distress. Eyes: PERRLA, EOMs, conjunctiva no swelling or erythema Sinuses: No Frontal/maxillary tenderness ENT/Mouth: Ext aud canals clear, TMs without erythema, bulging. No erythema, swelling, or exudate on post pharynx.  Tonsils not swollen or erythematous. Hearing normal.  Neck: Supple, thyroid normal.  Respiratory: Respiratory effort normal, BS equal bilaterally without rales, rhonchi, wheezing or stridor.  Cardio: RRR with no MRGs. Brisk peripheral pulses without edema.  Abdomen: Soft, + BS,  Non tender, no guarding, rebound, hernias, masses. Lymphatics: Non tender without lymphadenopathy.  Musculoskeletal: Full ROM, 5/5 strength, antalgic gait Skin: Warm, dry without rashes, lesions, ecchymosis.  Neuro:  Cranial nerves intact. Normal muscle tone, no cerebellar symptoms. Psych: Awake and oriented X 3, normal affect, Insight and Judgment appropriate.    Vicie Mutters, PA-C 8:46 AM [facility]

## 2015-01-13 NOTE — Patient Instructions (Addendum)
Ask Dr. Lynann Bologna if you can get sleep study after cervical fusion.   I think it is possible that you have sleep apnea. It can cause interrupted sleep, headaches, frequent awakenings, fatigue, dry mouth, fast/slow heart beats, memory issues, anxiety/depression, swelling, numbness tingling hands/feet, weight gain, shortness of breath, and the list goes on. Sleep apnea needs to be ruled out because if it is left untreated it does eventually lead to abnormal heart beats, lung failure or heart failure as well as increasing the risk of heart attack and stroke. There are masks you can wear OR a mouth piece that I can give you information about. Often times though people feel MUCH better after getting treatment.   Sleep Apnea  Sleep apnea is a sleep disorder characterized by abnormal pauses in breathing while you sleep. When your breathing pauses, the level of oxygen in your blood decreases. This causes you to move out of deep sleep and into light sleep. As a result, your quality of sleep is poor, and the system that carries your blood throughout your body (cardiovascular system) experiences stress. If sleep apnea remains untreated, the following conditions can develop:  High blood pressure (hypertension).  Coronary artery disease.  Inability to achieve or maintain an erection (impotence).  Impairment of your thought process (cognitive dysfunction). There are three types of sleep apnea: 1. Obstructive sleep apnea--Pauses in breathing during sleep because of a blocked airway. 2. Central sleep apnea--Pauses in breathing during sleep because the area of the brain that controls your breathing does not send the correct signals to the muscles that control breathing. 3. Mixed sleep apnea--A combination of both obstructive and central sleep apnea.  RISK FACTORS The following risk factors can increase your risk of developing sleep apnea:  Being overweight.  Smoking.  Having narrow passages in your nose and  throat.  Being of older age.  Being female.  Alcohol use.  Sedative and tranquilizer use.  Ethnicity. Among individuals younger than 35 years, African Americans are at increased risk of sleep apnea. SYMPTOMS   Difficulty staying asleep.  Daytime sleepiness and fatigue.  Loss of energy.  Irritability.  Loud, heavy snoring.  Morning headaches.  Trouble concentrating.  Forgetfulness.  Decreased interest in sex. DIAGNOSIS  In order to diagnose sleep apnea, your caregiver will perform a physical examination. Your caregiver may suggest that you take a home sleep test. Your caregiver may also recommend that you spend the night in a sleep lab. In the sleep lab, several monitors record information about your heart, lungs, and brain while you sleep. Your leg and arm movements and blood oxygen level are also recorded. TREATMENT The following actions may help to resolve mild sleep apnea:  Sleeping on your side.   Using a decongestant if you have nasal congestion.   Avoiding the use of depressants, including alcohol, sedatives, and narcotics.   Losing weight and modifying your diet if you are overweight. There also are devices and treatments to help open your airway:  Oral appliances. These are custom-made mouthpieces that shift your lower jaw forward and slightly open your bite. This opens your airway.  Devices that create positive airway pressure. This positive pressure "splints" your airway open to help you breathe better during sleep. The following devices create positive airway pressure:  Continuous positive airway pressure (CPAP) device. The CPAP device creates a continuous level of air pressure with an air pump. The air is delivered to your airway through a mask while you sleep. This continuous pressure keeps  your airway open.  Nasal expiratory positive airway pressure (EPAP) device. The EPAP device creates positive air pressure as you exhale. The device consists of  single-use valves, which are inserted into each nostril and held in place by adhesive. The valves create very little resistance when you inhale but create much more resistance when you exhale. That increased resistance creates the positive airway pressure. This positive pressure while you exhale keeps your airway open, making it easier to breath when you inhale again.  Bilevel positive airway pressure (BPAP) device. The BPAP device is used mainly in patients with central sleep apnea. This device is similar to the CPAP device because it also uses an air pump to deliver continuous air pressure through a mask. However, with the BPAP machine, the pressure is set at two different levels. The pressure when you exhale is lower than the pressure when you inhale.  Surgery. Typically, surgery is only done if you cannot comply with less invasive treatments or if the less invasive treatments do not improve your condition. Surgery involves removing excess tissue in your airway to create a wider passage way. Document Released: 03/15/2002 Document Revised: 07/20/2012 Document Reviewed: 08/01/2011 [facility] Patient Information 2015 [place], [place]. This information is not intended to replace advice given to you by your health care provider. Make sure you discuss any questions you have with your health care provider.  Can call Dr. Toy Cookey to get evaluated for dental sleep appliance. # [phone]  OR you can try Dr. Clearence Ped in [facility] # [phone]  We will send notes. Call and get price quote on both.

## 2015-01-14 LAB — LIPID PANEL
Cholesterol: 195 mg/dL (ref 125–200)
HDL: 46 mg/dL (ref >=46)
LDL Cholesterol: 126 mg/dL (ref <130)
Total CHOL/HDL Ratio: 4.2 Ratio (ref <=5.0)
Triglycerides: 114 mg/dL (ref <150)
VLDL: 23 mg/dL (ref <30)

## 2015-01-14 LAB — BASIC METABOLIC PANEL WITH GFR
BUN: 20 mg/dL (ref 7–25)
CO2: 28 mmol/L (ref 20–31)
Calcium: 9.6 mg/dL (ref 8.6–10.4)
Chloride: 105 mmol/L (ref 98–110)
Creat: 0.82 mg/dL (ref 0.50–1.05)
GFR, Est African American: >89 mL/min (ref >=60)
GFR, Est Non African American: 81 mL/min (ref >=60)
Glucose, Bld: 75 mg/dL (ref 65–99)
Potassium: 4.5 mmol/L (ref 3.5–5.3)
Sodium: 140 mmol/L (ref 135–146)

## 2015-01-14 LAB — HEPATIC FUNCTION PANEL
ALT: 31 U/L — ABNORMAL HIGH (ref 6–29)
AST: 25 U/L (ref 10–35)
Albumin: 4.1 g/dL (ref 3.6–5.1)
Alkaline Phosphatase: 87 U/L (ref 33–130)
Bilirubin, Direct: <0.1 mg/dL (ref <=0.2)
Total Bilirubin: 0.3 mg/dL (ref 0.2–1.2)
Total Protein: 6.9 g/dL (ref 6.1–8.1)

## 2015-01-14 LAB — TSH: TSH: 3.03 u[IU]/mL (ref 0.350–4.500)

## 2015-01-14 LAB — INSULIN, FASTING: Insulin fasting, serum: 6.6 u[IU]/mL (ref 2.0–19.6)

## 2015-01-14 LAB — MAGNESIUM: Magnesium: 2.3 mg/dL (ref 1.5–2.5)

## 2015-01-14 LAB — VITAMIN D 25 HYDROXY (VIT D DEFICIENCY, FRACTURES): Vit D, 25-Hydroxy: 77 ng/mL (ref 30–100)

## 2015-01-17 ENCOUNTER — Encounter (HOSPITAL_COMMUNITY): Payer: Self-pay

## 2015-01-17 ENCOUNTER — Encounter (HOSPITAL_COMMUNITY)
Admission: RE | Admit: 2015-01-17 | Discharge: 2015-01-17 | Disposition: A | Discharge status: Home / Self Care | Payer: BLUE CROSS/BLUE SHIELD | Source: Ambulatory Visit | Attending: Orthopedic Surgery | Admitting: Orthopedic Surgery

## 2015-01-17 DIAGNOSIS — M5410 Radiculopathy, site unspecified: Secondary | ICD-10-CM | POA: Insufficient documentation

## 2015-01-17 DIAGNOSIS — Z01812 Encounter for preprocedural laboratory examination: Secondary | ICD-10-CM | POA: Diagnosis not present

## 2015-01-17 LAB — URINALYSIS, ROUTINE W REFLEX MICROSCOPIC
Bilirubin Urine: NEGATIVE
Glucose, UA: NEGATIVE mg/dL
Hgb urine dipstick: NEGATIVE
Ketones, ur: NEGATIVE mg/dL
Leukocytes, UA: NEGATIVE
Nitrite: NEGATIVE
Protein, ur: NEGATIVE mg/dL
Specific Gravity, Urine: 1.016 (ref 1.005–1.030)
Urobilinogen, UA: 0.2 mg/dL (ref 0.0–1.0)
pH: 7 (ref 5.0–8.0)

## 2015-01-17 LAB — COMPREHENSIVE METABOLIC PANEL
ALT: 34 U/L (ref 14–54)
AST: 31 U/L (ref 15–41)
Albumin: 4.1 g/dL (ref 3.5–5.0)
Alkaline Phosphatase: 101 U/L (ref 38–126)
Anion gap: 11 (ref 5–15)
BUN: 15 mg/dL (ref 6–20)
CO2: 25 mmol/L (ref 22–32)
Calcium: 9.4 mg/dL (ref 8.9–10.3)
Chloride: 101 mmol/L (ref 101–111)
Creatinine, Ser: 0.75 mg/dL (ref 0.44–1.00)
GFR calc Af Amer: >60 mL/min (ref >60)
GFR calc non Af Amer: >60 mL/min (ref >60)
Glucose, Bld: 86 mg/dL (ref 65–99)
Potassium: 3.9 mmol/L (ref 3.5–5.1)
Sodium: 137 mmol/L (ref 135–145)
Total Bilirubin: 0.8 mg/dL (ref 0.3–1.2)
Total Protein: 7.4 g/dL (ref 6.5–8.1)

## 2015-01-17 LAB — CBC WITH DIFFERENTIAL/PLATELET
Basophils Absolute: 0 10*3/uL (ref 0.0–0.1)
Basophils Relative: 0 %
Eosinophils Absolute: 0.2 10*3/uL (ref 0.0–0.7)
Eosinophils Relative: 2 %
HCT: 46.1 % — ABNORMAL HIGH (ref 36.0–46.0)
Hemoglobin: 15.7 g/dL — ABNORMAL HIGH (ref 12.0–15.0)
Lymphocytes Relative: 31 %
Lymphs Abs: 3.3 10*3/uL (ref 0.7–4.0)
MCH: 29.7 pg (ref 26.0–34.0)
MCHC: 34.1 g/dL (ref 30.0–36.0)
MCV: 87.3 fL (ref 78.0–100.0)
Monocytes Absolute: 0.7 10*3/uL (ref 0.1–1.0)
Monocytes Relative: 6 %
Neutro Abs: 6.4 10*3/uL (ref 1.7–7.7)
Neutrophils Relative %: 61 %
Platelets: 305 10*3/uL (ref 150–400)
RBC: 5.28 MIL/uL — ABNORMAL HIGH (ref 3.87–5.11)
RDW: 13.8 % (ref 11.5–15.5)
WBC: 10.7 10*3/uL — ABNORMAL HIGH (ref 4.0–10.5)

## 2015-01-17 LAB — PROTIME-INR
INR: 1.03 (ref 0.00–1.49)
Prothrombin Time: 13.7 seconds (ref 11.6–15.2)

## 2015-01-17 LAB — SURGICAL PCR SCREEN
MRSA, PCR: NEGATIVE
Staphylococcus aureus: NEGATIVE

## 2015-01-17 LAB — APTT: aPTT: 31 seconds (ref 24–37)

## 2015-01-17 NOTE — Pre-Procedure Instructions (Signed)
    Weslyn Holsonback  01/17/2015      [facility] - [place], [place] - [address] [place] [address] Phone: [phone] Fax: [phone]    Your procedure is scheduled on 01-25-2015  Wednesday    Report to [facility] Admitting at 6:30 A.M.   Call this number if you have problems the morning of surgery:  [phone]   Remember:  Do not eat food or drink liquids after midnight.   Take these medicines the morning of surgery with A SIP OF WATER albuterol inhaler if needed,adderall,tessalon perles if needed,clonazepam,lexapro,gabapentin(neurontin),levethyroid(synthroid)   Do not wear jewelry, make-up or nail polish.  Do not wear lotions, powders, or perfumes.  You may not wear deodorant.  Do not shave 48 hours prior to surgery.     Do not bring valuables to the hospital.  [facility] is not responsible for any belongings or valuables.  Contacts, dentures or bridgework may not be worn into surgery.  Leave your suitcase in the car.  After surgery it may be brought to your room.  For patients admitted to the hospital, discharge time will be determined by your treatment team.     Special instructions:  See attached Sheet for instructions on CHG shower  Please read over the following fact sheets that you were given. Pain Booklet and Surgical Site Infection Prevention

## 2015-01-23 ENCOUNTER — Other Ambulatory Visit: Payer: Self-pay | Admitting: Orthopedic Surgery

## 2015-01-23 DIAGNOSIS — M5412 Radiculopathy, cervical region: Secondary | ICD-10-CM

## 2015-01-24 ENCOUNTER — Ambulatory Visit
Admission: RE | Admit: 2015-01-24 | Discharge: 2015-01-24 | Disposition: A | Discharge status: Home / Self Care | Payer: BLUE CROSS/BLUE SHIELD | Source: Ambulatory Visit | Attending: Orthopedic Surgery | Admitting: Orthopedic Surgery

## 2015-01-24 DIAGNOSIS — M5412 Radiculopathy, cervical region: Secondary | ICD-10-CM

## 2015-01-24 NOTE — H&P (Signed)
PREOPERATIVE H&P  Chief Complaint: r ARM PAIN  HPI: [name] is a 54 y.o. female who presents with ongoing pain in the RIGHT ARM  MRI reveals foraminal stenosis on the R at C7/T1. We did previously discuss the option of extending her fusion from C5/6 down to C7/T1. I recently had a telephone communication with the patient with regards to not proceeding with a fusion procedure, and instead proceeding with a posterior decompression only. The patient did elect to proceed with a decompression only, specifically, a right C7-T1 foraminotomy.  Patient has failed multiple forms of conservative care and continues to have pain (see office notes for additional details regarding the patient's full course of treatment)  Past Medical History  Diagnosis Date  . Fibromyalgia   . Hyperlipidemia   . COPD (chronic obstructive pulmonary disease) ([place])   . Prediabetes   . Restless leg syndrome   . Hypertension     normal now, only took medications for 1 month- thinks it was from pain.  Marland Kitchen Hypothyroidism   . Hemorrhoid   . Anxiety   . Depression   . ADD (attention deficit disorder)   . Bladder neoplasm   . History of melanoma excision     leg  . Iron deficiency anemia   . Cancer [facility]) 2015    Bladder cancer  . Shortness of breath dyspnea     with exertion  . H/O bronchitis     treated within the last week w steroids, antibx tessalon pearls.   . SUI (stress urinary incontinence, female)   . Fibroids   . Wears glasses   . H/O sebaceous cyst 07/18/14    left breast   . Shoulder impingement     right  . PONV (postoperative nausea and vomiting)     used a scop patch last surg-did well   Past Surgical History  Procedure Laterality Date  . Ankle fusion Right 2011  . Knee arthroscopy with medial menisectomy Right 05/22/2012    Procedure: KNEE ARTHROSCOPY WITH MEDIAL MENISECTOMY and Medial Plica excision;  Surgeon: Alta Corning, MD;  Location: [facility];  Service:  Orthopedics;  Laterality: Right;  . Anterior cervical decomp/discectomy fusion N/A 09/01/2013    Procedure: Anterior cervical decompression fusion, cervical 5-6 with instrumentation and allograft;  Surgeon: Sinclair Ship, MD;  Location: [facility];  Service: Orthopedics;  Laterality: N/A;  Anterior cervical decompression fusion, cervical 5-6 with instrumentation and allograft  . Shoulder arthroscopy with subacromial decompression Left 11/10/2013    Procedure: SHOULDER ARTHROSCOPY WITH SUBACROMIAL DECOMPRESSION WITH ROTATOR CUFF REPAIR AND BICEPS TENODESIS;  Surgeon: Alta Corning, MD;  Location: [facility];  Service: Orthopedics;  Laterality: Left;  . Closed reduction and pinning left fifth  proximal phalangeal fx  10-09-2005  . D & c hysteroscopy with novasure endometrial ablation  07-16-2006  . Shoulder arthroscopy w/ subacromial decompression and distal clavicle excision Left 11-27-2011    W/  DEBRIDEMENT  . Cataract extraction w/ intraocular lens implant Bilateral   . Tonsillectomy  age 39  . Laparoscopic cholecystectomy  2005  . Tubal ligation  1980  . Transurethral resection of bladder tumor N/A 01/11/2014    Procedure: TRANSURETHRAL RESECTION OF BLADDER TUMOR (TURBT);  Surgeon: Festus Aloe, MD;  Location: [facility];  Service: Urology;  Laterality: N/A;  . Cystoscopy w/ retrogrades Left 01/11/2014    Procedure: CYSTOSCOPY WITH RETROGRADE PYELOGRAM;  Surgeon: Festus Aloe, MD;  Location: [facility];  Service: Urology;  Laterality: Left;  . Cystoscopy with stent placement Left 01/11/2014    Procedure: CYSTOSCOPY WITH STENT PLACEMENT;  Surgeon: Festus Aloe, MD;  Location: [facility];  Service: Urology;  Laterality: Left;  . Laparoscopic assisted vaginal hysterectomy N/A 08/29/2014    Procedure: LAPAROSCOPIC ASSISTED VAGINAL HYSTERECTOMY;  Surgeon: Molli Posey, MD;  Location: [facility];  Service:  Gynecology;  Laterality: N/A;  . Salpingoophorectomy Bilateral 08/29/2014    Procedure: SALPINGO OOPHORECTOMY;  Surgeon: Molli Posey, MD;  Location: [facility];  Service: Gynecology;  Laterality: Bilateral;  . Pubovaginal sling N/A 08/29/2014    Procedure: Okey Dupre SINGLE INCISION SLING;  Surgeon: Molli Posey, MD;  Location: [facility];  Service: Gynecology;  Laterality: N/A;  . Laparotomy N/A 08/29/2014    Procedure: EXPLORATORY LAPAROTOMY WITH LIGATION OF BLEEDER;  Surgeon: Molli Posey, MD;  Location: [facility];  Service: Gynecology;  Laterality: N/A;  . Shoulder arthroscopy with subacromial decompression Right 12/21/2014    Procedure: SHOULDER ARTHROSCOPY WITH SUBACROMIAL DECOMPRESSION;  Surgeon: Dorna Leitz, MD;  Location: [place];  Service: Orthopedics;  Laterality: Right;  . Shoulder arthroscopy with distal clavicle resection Right 12/21/2014    Procedure: SHOULDER ARTHROSCOPY WITH DISTAL CLAVICLE RESECTION;  Surgeon: Dorna Leitz, MD;  Location: [place];  Service: Orthopedics;  Laterality: Right;  . Shoulder arthroscopy with bicepstenotomy Right 12/21/2014    Procedure: SHOULDER ARTHROSCOPY WITH BICEPSTENOTOMY;  Surgeon: Dorna Leitz, MD;  Location: [place];  Service: Orthopedics;  Laterality: Right;  . Abdominal hysterectomy     Social History   Social History  . Marital Status: Married    Spouse Name: N/A  . Number of Children: 2  . Years of Education: N/A   Occupational History  . logistic specialist    Social History Main Topics  . Smoking status: Former Smoker -- 1.00 packs/day for 30 years    Types: Cigarettes    Quit date: 06/24/2009  . Smokeless tobacco: Never Used  . Alcohol Use: No  . Drug Use: No  . Sexual Activity: Not on file   Other Topics Concern  . Not on file   Social History Narrative   Family History  Problem Relation Age of Onset  . Cancer Father     Lung and  stomach   . Heart disease Father   . Hypertension Father   . Stroke Father   . Heart failure Paternal Grandfather   . Cancer Paternal Grandfather     colon cancer  . Mental retardation Son   . Ovarian cancer Mother 36  . Breast cancer Mother     dx in her late 71s-50s  . Thyroid cancer Mother   . Ovarian cancer Sister 57  . Cancer Brother     abdominal tumor  . Ovarian cancer Maternal Aunt   . Breast cancer Maternal Aunt   . Breast cancer Maternal Uncle   . Breast cancer Paternal Aunt   . Ovarian cancer Sister 73  . Ovarian cancer Sister 15  . Cancer Brother     abdominal tumor  . Breast cancer Paternal Aunt   . Breast cancer Paternal Aunt   . Breast cancer Paternal Aunt   . Breast cancer Cousin   . Breast cancer Cousin    Allergies  Allergen Reactions  . Sulfa Antibiotics Hives and Itching   Prior to Admission medications   Medication Sig Start Date End Date Taking? Authorizing Provider  albuterol (VENTOLIN HFA) 108 (90 BASE) MCG/ACT inhaler Inhale 2 puffs into the lungs every 4 (four) hours as needed for wheezing or shortness of breath. 01/13/15   Vicie Mutters, PA-C  amphetamine-dextroamphetamine (ADDERALL) 20 MG tablet Take 1 tablet (20 mg total) by mouth 2 (two) times daily. 01/13/15   Vicie Mutters, PA-C  amphetamine-dextroamphetamine (ADDERALL) 20 MG tablet Take 1 tablet (20 mg total) by mouth 2 (two) times daily. Patient not taking: Reported on 01/16/2015 01/13/15   Vicie Mutters, PA-C  benzonatate (TESSALON PERLES) 100 MG capsule Take 1 capsule (100 mg total) by mouth every 6 (six) hours as needed for cough. 01/13/15   Vicie Mutters, PA-C  cholecalciferol (VITAMIN D) 1000 UNITS tablet Take 7,000 Units by mouth at bedtime.     Historical Provider, MD  clonazePAM (KLONOPIN) 2 MG tablet Take 1 tablet (2 mg total) by mouth 2 (two) times daily. 06/21/14   Unk Pinto, MD  escitalopram (LEXAPRO) 20 MG tablet Take 1 tablet (20 mg total) by mouth daily. 01/13/15 01/13/16   Vicie Mutters, PA-C  furosemide (LASIX) 20 MG tablet Take 20-40 mg by mouth daily as needed for fluid.    Historical Provider, MD  furosemide (LASIX) 40 MG tablet TAKE ONE-HALF TO ONE TABLET BY MOUTH ONCE DAILY Patient not taking: Reported on 01/16/2015 11/23/14   Unk Pinto, MD  gabapentin (NEURONTIN) 300 MG capsule Take 1 capsule (300 mg total) by mouth 3 (three) times daily. 10/11/14   Vicie Mutters, PA-C  ibuprofen (ADVIL,MOTRIN) 800 MG tablet Take 1 tablet (800 mg total) by mouth every 8 (eight) hours as needed (mild pain). 09/03/14   Molli Posey, MD  levothyroxine (SYNTHROID, LEVOTHROID) 75 MCG tablet Take 1 tablet (75 mcg total) by mouth daily before breakfast. 01/13/15   Vicie Mutters, PA-C  MAGNESIUM PO Take 450 mg by mouth daily.    Historical Provider, MD  oxyCODONE-acetaminophen (PERCOCET/ROXICET) 5-325 MG per tablet Take 1-2 tablets by mouth every 6 (six) hours as needed for severe pain (moderate to severe pain (when tolerating fluids)). Patient not taking: Reported on 01/16/2015 12/21/14   Gary Fleet, PA-C  triamterene-hydrochlorothiazide (MAXZIDE-25) 37.5-25 MG per tablet Take 1 tablet by mouth daily. 10/11/14   Vicie Mutters, PA-C     All other systems have been reviewed and were otherwise negative with the exception of those mentioned in the HPI and as above.  Physical Exam: There were no vitals filed for this visit.  General: Alert, no acute distress Cardiovascular: No pedal edema Respiratory: No cyanosis, no use of accessory musculature Skin: No lesions in the area of chief complaint Neurologic: Sensation intact distally Psychiatric: Patient is competent for consent with normal mood and affect Lymphatic: No axillary or cervical lymphadenopathy  MUSCULOSKELETAL: + spurling sign on the right  Assessment/Plan: Radiculopathy (R C8) Plan for Procedure(s): POSTERIOR CERVICAL LAMINOTOMY AND FORAMINOTOMY ON RIGHT (c7-t1)   Sinclair Ship,  MD 01/24/2015 8:20 AM

## 2015-01-25 ENCOUNTER — Encounter (HOSPITAL_COMMUNITY)
Admission: RE | Disposition: A | Discharge status: Home / Self Care | Payer: Self-pay | Source: Ambulatory Visit | Attending: Orthopedic Surgery

## 2015-01-25 ENCOUNTER — Encounter (HOSPITAL_COMMUNITY): Payer: Self-pay | Admitting: Anesthesiology

## 2015-01-25 ENCOUNTER — Ambulatory Visit (HOSPITAL_COMMUNITY): Payer: BLUE CROSS/BLUE SHIELD | Admitting: Anesthesiology

## 2015-01-25 ENCOUNTER — Ambulatory Visit (HOSPITAL_COMMUNITY)
Admission: RE | Admit: 2015-01-25 | Discharge: 2015-01-25 | Disposition: A | Discharge status: Home / Self Care | Payer: BLUE CROSS/BLUE SHIELD | Source: Ambulatory Visit | Attending: Orthopedic Surgery | Admitting: Orthopedic Surgery

## 2015-01-25 ENCOUNTER — Ambulatory Visit (HOSPITAL_COMMUNITY): Payer: BLUE CROSS/BLUE SHIELD

## 2015-01-25 DIAGNOSIS — Z419 Encounter for procedure for purposes other than remedying health state, unspecified: Secondary | ICD-10-CM

## 2015-01-25 DIAGNOSIS — M5412 Radiculopathy, cervical region: Secondary | ICD-10-CM | POA: Insufficient documentation

## 2015-01-25 DIAGNOSIS — E039 Hypothyroidism, unspecified: Secondary | ICD-10-CM | POA: Diagnosis not present

## 2015-01-25 DIAGNOSIS — Z87891 Personal history of nicotine dependence: Secondary | ICD-10-CM | POA: Diagnosis not present

## 2015-01-25 DIAGNOSIS — J449 Chronic obstructive pulmonary disease, unspecified: Secondary | ICD-10-CM | POA: Diagnosis not present

## 2015-01-25 DIAGNOSIS — I10 Essential (primary) hypertension: Secondary | ICD-10-CM | POA: Diagnosis not present

## 2015-01-25 DIAGNOSIS — M4803 Spinal stenosis, cervicothoracic region: Secondary | ICD-10-CM | POA: Insufficient documentation

## 2015-01-25 DIAGNOSIS — E785 Hyperlipidemia, unspecified: Secondary | ICD-10-CM | POA: Insufficient documentation

## 2015-01-25 HISTORY — PX: POSTERIOR CERVICAL FUSION/FORAMINOTOMY: SHX5038

## 2015-01-25 SURGERY — POSTERIOR CERVICAL FUSION/FORAMINOTOMY LEVEL 3
Anesthesia: General | Site: Neck

## 2015-01-25 MED ORDER — GLYCOPYRROLATE 0.2 MG/ML IJ SOLN
INTRAMUSCULAR | Status: AC
  Filled 2015-01-25: qty 1

## 2015-01-25 MED ORDER — PROPOFOL 10 MG/ML IV BOLUS
INTRAVENOUS | Status: DC | PRN
  Administered 2015-01-25: 140 mg via INTRAVENOUS

## 2015-01-25 MED ORDER — ONDANSETRON HCL 4 MG/2ML IJ SOLN
INTRAMUSCULAR | Status: AC
  Filled 2015-01-25: qty 2

## 2015-01-25 MED ORDER — BACITRACIN ZINC 500 UNIT/GM EX OINT
TOPICAL_OINTMENT | CUTANEOUS | Status: DC | PRN
  Administered 2015-01-25: 1 via TOPICAL

## 2015-01-25 MED ORDER — THROMBIN 20000 UNITS EX SOLR
CUTANEOUS | Status: DC | PRN
  Administered 2015-01-25: 20 mL via TOPICAL

## 2015-01-25 MED ORDER — CEFAZOLIN SODIUM-DEXTROSE 2-3 GM-% IV SOLR
INTRAVENOUS | Status: AC
  Filled 2015-01-25: qty 50

## 2015-01-25 MED ORDER — METOCLOPRAMIDE HCL 5 MG/ML IJ SOLN: 10.0000 mg | Freq: Once | INTRAMUSCULAR | Status: DC | PRN

## 2015-01-25 MED ORDER — MIDAZOLAM HCL 5 MG/5ML IJ SOLN
INTRAMUSCULAR | Status: DC | PRN
  Administered 2015-01-25: 2 mg via INTRAVENOUS

## 2015-01-25 MED ORDER — BACITRACIN ZINC 500 UNIT/GM EX OINT
TOPICAL_OINTMENT | CUTANEOUS | Status: AC
  Filled 2015-01-25: qty 28.35

## 2015-01-25 MED ORDER — HYDROMORPHONE HCL 1 MG/ML IJ SOLN
INTRAMUSCULAR | Status: AC
  Filled 2015-01-25: qty 1

## 2015-01-25 MED ORDER — FENTANYL CITRATE (PF) 100 MCG/2ML IJ SOLN
INTRAMUSCULAR | Status: DC | PRN
  Administered 2015-01-25: 100 ug via INTRAVENOUS
  Administered 2015-01-25 (×3): 50 ug via INTRAVENOUS

## 2015-01-25 MED ORDER — PROPOFOL 10 MG/ML IV BOLUS
INTRAVENOUS | Status: AC
  Filled 2015-01-25: qty 20

## 2015-01-25 MED ORDER — SODIUM CHLORIDE 0.9 % IJ SOLN
INTRAMUSCULAR | Status: AC
  Filled 2015-01-25: qty 10

## 2015-01-25 MED ORDER — PHENYLEPHRINE HCL 10 MG/ML IJ SOLN
INTRAMUSCULAR | Status: DC | PRN
  Administered 2015-01-25: 40 ug via INTRAVENOUS

## 2015-01-25 MED ORDER — OXYCODONE-ACETAMINOPHEN 5-325 MG PO TABS
ORAL_TABLET | ORAL | Status: AC
  Administered 2015-01-25: 1
  Filled 2015-01-25: qty 1

## 2015-01-25 MED ORDER — LIDOCAINE HCL (CARDIAC) 20 MG/ML IV SOLN
INTRAVENOUS | Status: DC | PRN
  Administered 2015-01-25: 60 mg via INTRAVENOUS

## 2015-01-25 MED ORDER — GLYCOPYRROLATE 0.2 MG/ML IJ SOLN
INTRAMUSCULAR | Status: DC | PRN
  Administered 2015-01-25: 0.6 mg via INTRAVENOUS

## 2015-01-25 MED ORDER — PHENYLEPHRINE 40 MCG/ML (10ML) SYRINGE FOR IV PUSH (FOR BLOOD PRESSURE SUPPORT)
PREFILLED_SYRINGE | INTRAVENOUS | Status: AC
  Filled 2015-01-25: qty 10

## 2015-01-25 MED ORDER — BUPIVACAINE-EPINEPHRINE (PF) 0.25% -1:200000 IJ SOLN
INTRAMUSCULAR | Status: AC
  Filled 2015-01-25: qty 30

## 2015-01-25 MED ORDER — MINERAL OIL LIGHT 100 % EX OIL
TOPICAL_OIL | CUTANEOUS | Status: AC
  Filled 2015-01-25: qty 25

## 2015-01-25 MED ORDER — BUPIVACAINE-EPINEPHRINE 0.25% -1:200000 IJ SOLN
INTRAMUSCULAR | Status: DC | PRN
  Administered 2015-01-25: 11 mL
  Administered 2015-01-25: 4 mL

## 2015-01-25 MED ORDER — SCOPOLAMINE 1 MG/3DAYS TD PT72
MEDICATED_PATCH | TRANSDERMAL | Status: DC | PRN
  Administered 2015-01-25: 1 via TRANSDERMAL

## 2015-01-25 MED ORDER — DEXAMETHASONE SODIUM PHOSPHATE 4 MG/ML IJ SOLN
INTRAMUSCULAR | Status: DC | PRN
  Administered 2015-01-25: 4 mg via INTRAVENOUS

## 2015-01-25 MED ORDER — MINERAL OIL LIGHT 100 % EX OIL
TOPICAL_OIL | CUTANEOUS | Status: DC | PRN
  Administered 2015-01-25: 1 via TOPICAL

## 2015-01-25 MED ORDER — ROCURONIUM BROMIDE 50 MG/5ML IV SOLN
INTRAVENOUS | Status: AC
  Filled 2015-01-25: qty 1

## 2015-01-25 MED ORDER — POVIDONE-IODINE 7.5 % EX SOLN: Freq: Once | CUTANEOUS | Status: DC

## 2015-01-25 MED ORDER — VECURONIUM BROMIDE 10 MG IV SOLR
INTRAVENOUS | Status: DC | PRN
  Administered 2015-01-25: 2 mg via INTRAVENOUS
  Administered 2015-01-25: 3 mg via INTRAVENOUS

## 2015-01-25 MED ORDER — ONDANSETRON HCL 4 MG/2ML IJ SOLN
INTRAMUSCULAR | Status: DC | PRN
  Administered 2015-01-25: 4 mg via INTRAVENOUS

## 2015-01-25 MED ORDER — FENTANYL CITRATE (PF) 250 MCG/5ML IJ SOLN
INTRAMUSCULAR | Status: AC
  Filled 2015-01-25: qty 5

## 2015-01-25 MED ORDER — NEOSTIGMINE METHYLSULFATE 10 MG/10ML IV SOLN
INTRAVENOUS | Status: DC | PRN
  Administered 2015-01-25: 5 mg via INTRAVENOUS

## 2015-01-25 MED ORDER — SUCCINYLCHOLINE CHLORIDE 20 MG/ML IJ SOLN
INTRAMUSCULAR | Status: AC
  Filled 2015-01-25: qty 1

## 2015-01-25 MED ORDER — METHYLPREDNISOLONE ACETATE 40 MG/ML IJ SUSP
INTRAMUSCULAR | Status: AC
  Filled 2015-01-25: qty 1

## 2015-01-25 MED ORDER — MIDAZOLAM HCL 2 MG/2ML IJ SOLN
INTRAMUSCULAR | Status: AC
  Filled 2015-01-25: qty 4

## 2015-01-25 MED ORDER — HYDROMORPHONE HCL 1 MG/ML IJ SOLN
0.2500 mg | INTRAMUSCULAR | Status: DC | PRN
  Administered 2015-01-25 (×2): 0.5 mg via INTRAVENOUS
  Administered 2015-01-25: 0.25 mg via INTRAVENOUS

## 2015-01-25 MED ORDER — PROMETHAZINE HCL 25 MG/ML IJ SOLN
INTRAMUSCULAR | Status: AC
  Filled 2015-01-25: qty 1

## 2015-01-25 MED ORDER — PROMETHAZINE HCL 25 MG/ML IJ SOLN
6.2500 mg | INTRAMUSCULAR | Status: DC | PRN
  Administered 2015-01-25: 6.25 mg via INTRAVENOUS

## 2015-01-25 MED ORDER — LIDOCAINE HCL (CARDIAC) 20 MG/ML IV SOLN
INTRAVENOUS | Status: AC
  Filled 2015-01-25: qty 5

## 2015-01-25 MED ORDER — 0.9 % SODIUM CHLORIDE (POUR BTL) OPTIME
TOPICAL | Status: DC | PRN
  Administered 2015-01-25: 1000 mL

## 2015-01-25 MED ORDER — HYDROMORPHONE HCL 1 MG/ML IJ SOLN: 0.2500 mg | INTRAMUSCULAR | Status: DC | PRN

## 2015-01-25 MED ORDER — LACTATED RINGERS IV SOLN
INTRAVENOUS | Status: DC | PRN
  Administered 2015-01-25 (×2): via INTRAVENOUS

## 2015-01-25 MED ORDER — THROMBIN 20000 UNITS EX SOLR
CUTANEOUS | Status: AC
  Filled 2015-01-25: qty 40000

## 2015-01-25 MED ORDER — DEXAMETHASONE SODIUM PHOSPHATE 4 MG/ML IJ SOLN
INTRAMUSCULAR | Status: AC
  Filled 2015-01-25: qty 1

## 2015-01-25 MED ORDER — EPHEDRINE SULFATE 50 MG/ML IJ SOLN
INTRAMUSCULAR | Status: AC
  Filled 2015-01-25: qty 1

## 2015-01-25 MED ORDER — CEFAZOLIN SODIUM-DEXTROSE 2-3 GM-% IV SOLR
2.0000 g | INTRAVENOUS | Status: AC
  Administered 2015-01-25: 2 g via INTRAVENOUS

## 2015-01-25 MED ORDER — HEMOSTATIC AGENTS (NO CHARGE) OPTIME
TOPICAL | Status: DC | PRN
  Administered 2015-01-25: 1 via TOPICAL

## 2015-01-25 MED ORDER — METHYLPREDNISOLONE ACETATE 40 MG/ML IJ SUSP
INTRAMUSCULAR | Status: DC | PRN
  Administered 2015-01-25: 40 mg

## 2015-01-25 MED ORDER — ROCURONIUM BROMIDE 100 MG/10ML IV SOLN
INTRAVENOUS | Status: DC | PRN
  Administered 2015-01-25: 50 mg via INTRAVENOUS

## 2015-01-25 SURGICAL SUPPLY — 67 items
APL SKNCLS STERI-STRIP NONHPOA (GAUZE/BANDAGES/DRESSINGS) ×1
BENZOIN TINCTURE PRP APPL 2/3 (GAUZE/BANDAGES/DRESSINGS) ×3 IMPLANT
BLADE CLIPPER SURG NEURO (BLADE) IMPLANT
BUR NEURO DRILL SOFT 3.0X3.8M (BURR) ×2 IMPLANT
BUR PRESCISION 1.7 ELITE (BURR) ×2 IMPLANT
CONT SPEC STER OR (MISCELLANEOUS) ×2 IMPLANT
CORDS BIPOLAR (ELECTRODE) ×2 IMPLANT
COVER MAYO STAND STRL (DRAPES) ×1 IMPLANT
COVER SURGICAL LIGHT HANDLE (MISCELLANEOUS) ×2 IMPLANT
DRAIN CHANNEL 15F RND FF W/TCR (WOUND CARE) IMPLANT
DRAPE C-ARM 42X72 X-RAY (DRAPES) ×2 IMPLANT
DRAPE INCISE IOBAN 66X45 STRL (DRAPES) ×2 IMPLANT
DRAPE PED LAPAROTOMY (DRAPES) ×2 IMPLANT
DRAPE POUCH INSTRU U-SHP 10X18 (DRAPES) ×2 IMPLANT
DRAPE PROXIMA HALF (DRAPES) ×7 IMPLANT
DRAPE SURG 17X23 STRL (DRAPES) ×13 IMPLANT
DRAPE TABLE COVER HEAVY DUTY (DRAPES) ×1 IMPLANT
DRSG MEPILEX BORDER 4X8 (GAUZE/BANDAGES/DRESSINGS) ×1 IMPLANT
DURAPREP 26ML APPLICATOR (WOUND CARE) ×2 IMPLANT
ELECT CAUTERY BLADE 6.4 (BLADE) ×2 IMPLANT
ELECT REM PT RETURN 9FT ADLT (ELECTROSURGICAL) ×2
ELECTRODE REM PT RTRN 9FT ADLT (ELECTROSURGICAL) ×1 IMPLANT
EVACUATOR SILICONE 100CC (DRAIN) IMPLANT
GAUZE SPONGE 4X4 12PLY STRL (GAUZE/BANDAGES/DRESSINGS) ×2 IMPLANT
GAUZE SPONGE 4X4 16PLY XRAY LF (GAUZE/BANDAGES/DRESSINGS) ×8 IMPLANT
GLOVE BIO SURGEON STRL SZ7 (GLOVE) ×2 IMPLANT
GLOVE BIO SURGEON STRL SZ8 (GLOVE) ×2 IMPLANT
GLOVE BIOGEL PI IND STRL 8 (GLOVE) ×1 IMPLANT
GLOVE BIOGEL PI INDICATOR 8 (GLOVE) ×1
GOWN STRL REUS W/ TWL LRG LVL3 (GOWN DISPOSABLE) ×4 IMPLANT
GOWN STRL REUS W/ TWL XL LVL3 (GOWN DISPOSABLE) ×1 IMPLANT
GOWN STRL REUS W/TWL LRG LVL3 (GOWN DISPOSABLE) ×8
GOWN STRL REUS W/TWL XL LVL3 (GOWN DISPOSABLE) ×2
IV CATH 14GX2 1/4 (CATHETERS) ×2 IMPLANT
KIT BASIN OR (CUSTOM PROCEDURE TRAY) ×2 IMPLANT
KIT ROOM TURNOVER OR (KITS) ×2 IMPLANT
MARKER SKIN DUAL TIP RULER LAB (MISCELLANEOUS) ×1 IMPLANT
NDL 18GX1X1/2 (RX/OR ONLY) (NEEDLE) IMPLANT
NDL HYPO 25GX1X1/2 BEV (NEEDLE) ×1 IMPLANT
NDL SPNL 20GX3.5 QUINCKE YW (NEEDLE) IMPLANT
NEEDLE 18GX1X1/2 (RX/OR ONLY) (NEEDLE) ×2 IMPLANT
NEEDLE HYPO 25GX1X1/2 BEV (NEEDLE) ×2 IMPLANT
NEEDLE SPNL 20GX3.5 QUINCKE YW (NEEDLE) ×2 IMPLANT
NS IRRIG 1000ML POUR BTL (IV SOLUTION) ×2 IMPLANT
PACK LAMINECTOMY ORTHO (CUSTOM PROCEDURE TRAY) ×2 IMPLANT
PAD ARMBOARD 7.5X6 YLW CONV (MISCELLANEOUS) ×3 IMPLANT
PATTIES SURGICAL .5 X.5 (GAUZE/BANDAGES/DRESSINGS) ×2 IMPLANT
PIN MAYFIELD SKULL DISP (PIN) ×2 IMPLANT
SPONGE INTESTINAL PEANUT (DISPOSABLE) ×1 IMPLANT
SPONGE SURGIFOAM ABS GEL 100 (HEMOSTASIS) ×2 IMPLANT
STRIP CLOSURE SKIN 1/2X4 (GAUZE/BANDAGES/DRESSINGS) ×1 IMPLANT
SURGIFLO W/THROMBIN 8M KIT (HEMOSTASIS) ×1 IMPLANT
SUT MNCRL AB 4-0 PS2 18 (SUTURE) ×2 IMPLANT
SUT VIC AB 0 CT1 18XCR BRD 8 (SUTURE) ×1 IMPLANT
SUT VIC AB 0 CT1 8-18 (SUTURE) ×2
SUT VIC AB 1 CT1 18XCR BRD 8 (SUTURE) ×2 IMPLANT
SUT VIC AB 1 CT1 8-18 (SUTURE) ×4
SUT VIC AB 2-0 CT2 18 VCP726D (SUTURE) ×2 IMPLANT
SYR BULB IRRIGATION 50ML (SYRINGE) ×2 IMPLANT
SYR CONTROL 10ML LL (SYRINGE) ×2 IMPLANT
SYR TB 1ML LUER SLIP (SYRINGE) ×1 IMPLANT
TAPE CLOTH 4X10 WHT NS (GAUZE/BANDAGES/DRESSINGS) ×2 IMPLANT
TOWEL OR 17X24 6PK STRL BLUE (TOWEL DISPOSABLE) ×2 IMPLANT
TOWEL OR 17X26 10 PK STRL BLUE (TOWEL DISPOSABLE) ×2 IMPLANT
TRAY FOLEY CATH 16FRSI W/METER (SET/KITS/TRAYS/PACK) ×2 IMPLANT
WATER STERILE IRR 1000ML POUR (IV SOLUTION) ×1 IMPLANT
YANKAUER SUCT BULB TIP NO VENT (SUCTIONS) ×2 IMPLANT

## 2015-01-25 NOTE — Progress Notes (Signed)
Dilaudid 0.25 mg iv wasted and witness by Veneta Penton, RN

## 2015-01-25 NOTE — Anesthesia Preprocedure Evaluation (Addendum)
Anesthesia Evaluation  Patient identified by MRN, date of birth, ID band Patient awake    Reviewed: Allergy & Precautions, NPO status , Patient's Chart, lab work & pertinent test results  History of Anesthesia Complications (+) PONV  Airway Mallampati: II  TM Distance: >3 FB Neck ROM: Full    Dental   Pulmonary shortness of breath, COPD, former smoker,    breath sounds clear to auscultation       Cardiovascular hypertension, + Peripheral Vascular Disease   Rhythm:Regular Rate:Normal     Neuro/Psych    GI/Hepatic Neg liver ROS, GERD  ,  Endo/Other  Hypothyroidism   Renal/GU negative Renal ROS     Musculoskeletal  (+) Fibromyalgia -  Abdominal   Peds  Hematology   Anesthesia Other Findings   Reproductive/Obstetrics                            Anesthesia Physical Anesthesia Plan  ASA: III  Anesthesia Plan: General   Post-op Pain Management:    Induction: Intravenous  Airway Management Planned: Oral ETT  Additional Equipment:   Intra-op Plan:   Post-operative Plan: Extubation in OR  Informed Consent: I have reviewed the patients History and Physical, chart, labs and discussed the procedure including the risks, benefits and alternatives for the proposed anesthesia with the patient or authorized representative who has indicated his/her understanding and acceptance.   Dental advisory given  Plan Discussed with: CRNA and Anesthesiologist  Anesthesia Plan Comments:         Anesthesia Quick Evaluation

## 2015-01-25 NOTE — Transfer of Care (Signed)
Immediate Anesthesia Transfer of Care Note  Patient: Katherleen Folkes  Procedure(s) Performed: Procedure(s) with comments: POSTERIOR CERVICAL DECOMPRESSSION CERVICAL 7-THORACIC 1 (N/A) - Posterior cervical decompression, cervical 5-6, cervical 6-7, cervical 7-thoracic 1   Patient Location: PACU  Anesthesia Type:General  Level of Consciousness: awake, oriented and patient cooperative  Airway & Oxygen Therapy: Patient Spontanous Breathing and Patient connected to nasal cannula oxygen  Post-op Assessment: Report given to RN and Post -op Vital signs reviewed and stable  Post vital signs: Reviewed  Last Vitals:  Filed Vitals:   01/25/15 0639  BP: 114/50  Pulse: 81  Temp: 36.4 C  Resp: 18    Complications: No apparent anesthesia complications

## 2015-01-25 NOTE — Anesthesia Postprocedure Evaluation (Signed)
  Anesthesia Post-op Note  Patient: [name]  Procedure(s) Performed: Procedure(s) with comments: POSTERIOR CERVICAL DECOMPRESSSION CERVICAL 7-THORACIC 1 (N/A) - Posterior cervical decompression, cervical 5-6, cervical 6-7, cervical 7-thoracic 1   Patient Location: PACU  Anesthesia Type:General  Level of Consciousness: awake  Airway and Oxygen Therapy: Patient Spontanous Breathing  Post-op Pain: mild  Post-op Assessment: Post-op Vital signs reviewed              Post-op Vital Signs: Reviewed  Last Vitals:  Filed Vitals:   01/25/15 1115  BP: 135/60  Pulse: 72  Temp: 36.4 C  Resp: 10    Complications: No apparent anesthesia complications

## 2015-01-25 NOTE — Op Note (Signed)
NAME:  [name], [name] NO.:  [id number]  MEDICAL RECORD NO.:  [id number]  LOCATION:  [facility]                         FACILITY:  [facility]  PHYSICIAN:  Phylliss Bob, MD      DATE OF BIRTH:  May 05, 1960  DATE OF PROCEDURE:  01/25/2015                              OPERATIVE REPORT   PREOPERATIVE DIAGNOSES: 1. Right-sided C8 radiculopathy. 2. Right-sided C7-T1 neural foraminal stenosis.  POSTOPERATIVE DIAGNOSES: 1. Right-sided C8 radiculopathy. 2. Right-sided C7-T1 neural foraminal stenosis.  PROCEDURE:  Right-sided C7-T1 laminotomy with partial facetectomy and foraminotomy with decompression of the exiting right C8 nerve.  SURGEON:  Phylliss Bob, MD  ASSISTANT:  Pricilla Holm, PA-C.  ANESTHESIA:  General endotracheal anesthesia.  COMPLICATIONS:  None.  DISPOSITION:  Stable.  ESTIMATED BLOOD LOSS:  Minimal.  INDICATIONS FOR SURGERY:  Briefly, [name] is a very pleasant 54- year-old female who is status post a previous C5-6 ACDF.  She did very well from that surgery, however, more recently, had been complaining of pain extending into the medial aspect of her arm and forearm and into her ulna and 2 fingers.  An MRI did reveal a disk protrusion resulting in neural foraminal stenosis on the right at the C7-T1 level.  We did discuss options going forward.  We specifically discussed the option of extending her fusion down to the T1 level in addition to a foraminotomy. We did also discuss the option of performing simply decompressive procedure, and not proceeding with a fusion.  We did ultimately together elected to proceed with a decompression only.  The patient did fully understand the risks and limitations of the procedure, including ongoing regeneration of the operative segment which may include additional surgical intervention.  OPERATIVE DETAILS:  On January 25, 2015, the patient was brought to surgery and general endotracheal anesthesia was  administered.  The patient was intubated in the supine position.  A [place] head holder was placed by me.  The patient was then rolled prone onto a hospital bed.  Prior to turning the patient, there were gel rolls placed under the chest.  The head was appropriately positioned and slightly flexed in order to optimize the exposure of the facet.  The patient's arms were secured to her sides, and all bony prominences were meticulously padded. The patient's shoulders were taped to the inferior aspect of the bed. The neck was then prepped and draped in the usual sterile fashion.  I then made a midline incision overlying the C7-T1 intervertebral space. I then made a curvilinear incision just to the right of the midline overlying approximately the C7-T1 region.  The paraspinal musculature was bluntly swept laterally.  The facet joint at the C7-T1 level was identified.  A self-retaining McCullough retractor was placed.  I then performed C2 lateral and subsequently obtained lateral fluoroscopic images in addition to AP fluoroscopic images to confirm the appropriate operative level.  Once confirmed, I did use a high-speed bur to remove the medial and inferior aspect of the inferior articular process of C7. I then meticulously used the #1 followed by the #2 Kerrison to complete the partial facetectomy by removing the superior articular process of C7.  In doing so, I was  able to expose the exiting C8 nerve in the region of the neural foramen.  I then used a nerve hook to confirm that there was a complete neuroforaminal decompression performed.  I was able to palpate the T1 pedicle to the lateral border of the pedicle, which did confirm a full and complete decompression of the neural foramen.  I then copiously irrigated the wound.  A 40 mg of Depo-Medrol was introduced about the region of the C8 nerve.  Bleeding was controlled. I then closed the wound in layers using #1 Vicryl followed by 2-0 Vicryl,  followed by 3-0 Monocryl.  Benzoin and Steri-Strips were applied followed by sterile dressing.  All instrument counts were correct at the termination of the procedure.  Of note, Pricilla Holm was my assistant throughout surgery, and did aid in retraction, suctioning, and closure from start to finish.     Phylliss Bob, MD     MD/MEDQ  D:  01/25/2015  T:  01/25/2015  Job:  [id number]  cc:   Unk Pinto, M.D.

## 2015-01-25 NOTE — Progress Notes (Signed)
Short stay bed requested, none currently available. Charge nurse advised she would contact when something becomes available.

## 2015-01-25 NOTE — Anesthesia Procedure Notes (Signed)

## 2015-01-26 ENCOUNTER — Encounter (HOSPITAL_COMMUNITY): Payer: Self-pay | Admitting: Orthopedic Surgery

## 2015-03-31 ENCOUNTER — Other Ambulatory Visit: Payer: Self-pay | Admitting: Physician Assistant

## 2015-04-04 NOTE — Telephone Encounter (Signed)
Rx called into [facility] in [place].

## 2015-04-09 HISTORY — PX: ABDOMINAL HYSTERECTOMY: SHX81

## 2015-05-06 ENCOUNTER — Other Ambulatory Visit: Payer: Self-pay | Admitting: Physician Assistant

## 2015-05-06 DIAGNOSIS — M797 Fibromyalgia: Secondary | ICD-10-CM

## 2015-05-06 DIAGNOSIS — F988 Other specified behavioral and emotional disorders with onset usually occurring in childhood and adolescence: Secondary | ICD-10-CM

## 2015-05-06 DIAGNOSIS — I10 Essential (primary) hypertension: Secondary | ICD-10-CM

## 2015-05-06 MED ORDER — TRIAMTERENE-HCTZ 37.5-25 MG PO TABS: 1.0000 | ORAL_TABLET | Freq: Every day | ORAL | Status: DC

## 2015-05-06 MED ORDER — AMPHETAMINE-DEXTROAMPHETAMINE 20 MG PO TABS: ORAL_TABLET | ORAL | Status: DC

## 2015-05-10 ENCOUNTER — Ambulatory Visit (INDEPENDENT_AMBULATORY_CARE_PROVIDER_SITE_OTHER): Payer: BLUE CROSS/BLUE SHIELD | Admitting: Internal Medicine

## 2015-05-10 ENCOUNTER — Encounter: Payer: Self-pay | Admitting: Internal Medicine

## 2015-05-10 VITALS — BP 126/74 | HR 84 | Temp 97.7°F | Resp 16 | Ht 65.0 in | Wt 202.8 lb

## 2015-05-10 DIAGNOSIS — M797 Fibromyalgia: Secondary | ICD-10-CM

## 2015-05-10 DIAGNOSIS — E039 Hypothyroidism, unspecified: Secondary | ICD-10-CM | POA: Diagnosis not present

## 2015-05-10 DIAGNOSIS — E559 Vitamin D deficiency, unspecified: Secondary | ICD-10-CM | POA: Diagnosis not present

## 2015-05-10 DIAGNOSIS — R7303 Prediabetes: Secondary | ICD-10-CM

## 2015-05-10 DIAGNOSIS — E785 Hyperlipidemia, unspecified: Secondary | ICD-10-CM

## 2015-05-10 DIAGNOSIS — E669 Obesity, unspecified: Secondary | ICD-10-CM

## 2015-05-10 DIAGNOSIS — I10 Essential (primary) hypertension: Secondary | ICD-10-CM | POA: Diagnosis not present

## 2015-05-10 DIAGNOSIS — G629 Polyneuropathy, unspecified: Secondary | ICD-10-CM

## 2015-05-10 DIAGNOSIS — Z79899 Other long term (current) drug therapy: Secondary | ICD-10-CM

## 2015-05-10 DIAGNOSIS — G608 Other hereditary and idiopathic neuropathies: Secondary | ICD-10-CM

## 2015-05-10 LAB — HEMOGLOBIN A1C
Hgb A1c MFr Bld: 5.9 % — ABNORMAL HIGH (ref <5.7)
Mean Plasma Glucose: 123 mg/dL — ABNORMAL HIGH (ref <117)

## 2015-05-10 LAB — BASIC METABOLIC PANEL WITH GFR
BUN: 17 mg/dL (ref 7–25)
CO2: 28 mmol/L (ref 20–31)
Calcium: 9.8 mg/dL (ref 8.6–10.4)
Chloride: 104 mmol/L (ref 98–110)
Creat: 0.8 mg/dL (ref 0.50–1.05)
GFR, Est African American: >89 mL/min (ref >=60)
GFR, Est Non African American: 83 mL/min (ref >=60)
Glucose, Bld: 89 mg/dL (ref 65–99)
Potassium: 4.1 mmol/L (ref 3.5–5.3)
Sodium: 141 mmol/L (ref 135–146)

## 2015-05-10 LAB — LIPID PANEL
Cholesterol: 181 mg/dL (ref 125–200)
HDL: 51 mg/dL (ref >=46)
LDL Cholesterol: 114 mg/dL (ref <130)
Total CHOL/HDL Ratio: 3.5 Ratio (ref <=5.0)
Triglycerides: 80 mg/dL (ref <150)
VLDL: 16 mg/dL (ref <30)

## 2015-05-10 LAB — CBC WITH DIFFERENTIAL/PLATELET
Basophils Absolute: 0 10*3/uL (ref 0.0–0.1)
Basophils Relative: 0 % (ref 0–1)
Eosinophils Absolute: 0.1 10*3/uL (ref 0.0–0.7)
Eosinophils Relative: 1 % (ref 0–5)
HCT: 45.4 % (ref 36.0–46.0)
Hemoglobin: 15.2 g/dL — ABNORMAL HIGH (ref 12.0–15.0)
Lymphocytes Relative: 33 % (ref 12–46)
Lymphs Abs: 3.6 10*3/uL (ref 0.7–4.0)
MCH: 29.9 pg (ref 26.0–34.0)
MCHC: 33.5 g/dL (ref 30.0–36.0)
MCV: 89.2 fL (ref 78.0–100.0)
MPV: 9.9 fL (ref 8.6–12.4)
Monocytes Absolute: 0.5 10*3/uL (ref 0.1–1.0)
Monocytes Relative: 5 % (ref 3–12)
Neutro Abs: 6.6 10*3/uL (ref 1.7–7.7)
Neutrophils Relative %: 61 % (ref 43–77)
Platelets: 319 10*3/uL (ref 150–400)
RBC: 5.09 MIL/uL (ref 3.87–5.11)
RDW: 13.3 % (ref 11.5–15.5)
WBC: 10.8 10*3/uL — ABNORMAL HIGH (ref 4.0–10.5)

## 2015-05-10 LAB — HEPATIC FUNCTION PANEL
ALT: 29 U/L (ref 6–29)
AST: 27 U/L (ref 10–35)
Albumin: 4.4 g/dL (ref 3.6–5.1)
Alkaline Phosphatase: 94 U/L (ref 33–130)
Bilirubin, Direct: 0.1 mg/dL (ref <=0.2)
Indirect Bilirubin: 0.4 mg/dL (ref 0.2–1.2)
Total Bilirubin: 0.5 mg/dL (ref 0.2–1.2)
Total Protein: 6.9 g/dL (ref 6.1–8.1)

## 2015-05-10 LAB — MAGNESIUM: Magnesium: 2.1 mg/dL (ref 1.5–2.5)

## 2015-05-10 MED ORDER — GABAPENTIN 600 MG PO TABS: ORAL_TABLET | ORAL | Status: DC

## 2015-05-10 NOTE — Progress Notes (Deleted)
Patient ID: [name], female   DOB: 08-26-1960, 55 y.o.   MRN: [id number]

## 2015-05-10 NOTE — Progress Notes (Signed)
Subjective:    Patient ID: [name], female    DOB: 1960-08-09, 55 y.o.   MRN: [id number]  HPI  This very nice 55 yo MWF with hx/o HTN, HLD, COPD, MO/PreDM, and Bladder Cancer who had C5-C6 ACDF in May 2015 by Dr Eda Keys and more recently a Posterior C5-T1 Decompression in Oct 2016. She presents with numbness and tingling paresthesias of her hands & feet - worse over the last 2 months , but relates actually th toe numbness predates about 2 years. She apparently had either PVCV's or EMG studies by Dr Mina Marble. She relates she was advised to see her PCP to further evaluate her sx's. Also she's c/o increasing pain in the neck & LB area.   Medication Sig  . albuterol ( HFA inhaler Inhale 2 puffs into the lungs every 4 (four) hours as needed for wheezing or shortness of breath.  . ADDERALL 20 MG  Take 1/2 to 1 tablet 1 or 2 x day only if needed for ADD  . VITAMIN D 1000 UNITS Take 7,000 Units by mouth at bedtime.   . clonazePAM  2 MG  TAKE ONE TABLET BY MOUTH TWICE DAILY  . escitalopram  20 MG  Take 1 tablet (20 mg total) by mouth daily.  . furosemide  20 MG  Take 20-40 mg by mouth daily as needed for fluid.  Marland Kitchen levothyroxine  75 MCG  Take 1 tablet (75 mcg total) by mouth daily before breakfast.  . MAGNESIUM  Take 450 mg by mouth daily.  Marland Kitchen triamterene-hctz (MAXZIDE-25) 37.5-25  Take 1 tablet by mouth daily.  Marland Kitchen gabapentin  300 MG  Take 1 capsule (300 mg total) by mouth 3 (three) times daily.  . Furosemide  40 MG  Patient not taking: Reported on 01/16/2015  . PERCOCET/ROXICET 5-325 MG  Patient not taking: Reported on 01/16/2015   Allergies  Allergen Reactions  . Sulfa Antibiotics Hives and Itching   Past Medical History  Diagnosis Date  . Fibromyalgia   . Hyperlipidemia   . COPD (chronic obstructive pulmonary disease) ([place])   . Prediabetes   . Restless leg syndrome   . Hypertension     normal now, only took medications for 1 month- thinks it was from pain.  Marland Kitchen Hypothyroidism   .  Hemorrhoid   . Anxiety   . Depression   . ADD (attention deficit disorder)   . Bladder neoplasm   . History of melanoma excision     leg  . Iron deficiency anemia   . Cancer [facility]) 2015    Bladder cancer  . Shortness of breath dyspnea     with exertion  . H/O bronchitis     treated within the last week w steroids, antibx tessalon pearls.   . SUI (stress urinary incontinence, female)   . Fibroids   . Wears glasses   . H/O sebaceous cyst 07/18/14    left breast   . Shoulder impingement     right  . PONV (postoperative nausea and vomiting)     used a scop patch last surg-did well   Past Surgical History  Procedure Laterality Date  . Ankle fusion Right 2011  . Knee arthroscopy with medial menisectomy Right 05/22/2012    Procedure: KNEE ARTHROSCOPY WITH MEDIAL MENISECTOMY and Medial Plica excision;  Surgeon: Alta Corning, MD;  Location: [place];  Service: Orthopedics;  Laterality: Right;  . Anterior cervical decomp/discectomy fusion N/A 09/01/2013    Procedure: Anterior cervical decompression fusion,  cervical 5-6 with instrumentation and allograft;  Surgeon: Sinclair Ship, MD;  Location: [place];  Service: Orthopedics;  Laterality: N/A;  Anterior cervical decompression fusion, cervical 5-6 with instrumentation and allograft  . Shoulder arthroscopy with subacromial decompression Left 11/10/2013    Procedure: SHOULDER ARTHROSCOPY WITH SUBACROMIAL DECOMPRESSION WITH ROTATOR CUFF REPAIR AND BICEPS TENODESIS;  Surgeon: Alta Corning, MD;  Location: [place];  Service: Orthopedics;  Laterality: Left;  . Closed reduction and pinning left fifth  proximal phalangeal fx  10-09-2005  . D & c hysteroscopy with novasure endometrial ablation  07-16-2006  . Shoulder arthroscopy w/ subacromial decompression and distal clavicle excision Left 11-27-2011    W/  DEBRIDEMENT  . Cataract extraction w/ intraocular lens implant Bilateral   . Tonsillectomy  age 73  .  Laparoscopic cholecystectomy  2005  . Tubal ligation  1980  . Transurethral resection of bladder tumor N/A 01/11/2014    Procedure: TRANSURETHRAL RESECTION OF BLADDER TUMOR (TURBT);  Surgeon: Festus Aloe, MD;  Location: [facility];  Service: Urology;  Laterality: N/A;  . Cystoscopy w/ retrogrades Left 01/11/2014    Procedure: CYSTOSCOPY WITH RETROGRADE PYELOGRAM;  Surgeon: Festus Aloe, MD;  Location: [facility];  Service: Urology;  Laterality: Left;  . Cystoscopy with stent placement Left 01/11/2014    Procedure: CYSTOSCOPY WITH STENT PLACEMENT;  Surgeon: Festus Aloe, MD;  Location: [facility];  Service: Urology;  Laterality: Left;  . Laparoscopic assisted vaginal hysterectomy N/A 08/29/2014    Procedure: LAPAROSCOPIC ASSISTED VAGINAL HYSTERECTOMY;  Surgeon: Molli Posey, MD;  Location: [facility];  Service: Gynecology;  Laterality: N/A;  . Salpingoophorectomy Bilateral 08/29/2014    Procedure: SALPINGO OOPHORECTOMY;  Surgeon: Molli Posey, MD;  Location: [facility];  Service: Gynecology;  Laterality: Bilateral;  . Pubovaginal sling N/A 08/29/2014    Procedure: Okey Dupre SINGLE INCISION SLING;  Surgeon: Molli Posey, MD;  Location: [facility];  Service: Gynecology;  Laterality: N/A;  . Laparotomy N/A 08/29/2014    Procedure: EXPLORATORY LAPAROTOMY WITH LIGATION OF BLEEDER;  Surgeon: Molli Posey, MD;  Location: [facility];  Service: Gynecology;  Laterality: N/A;  . Shoulder arthroscopy with subacromial decompression Right 12/21/2014    Procedure: SHOULDER ARTHROSCOPY WITH SUBACROMIAL DECOMPRESSION;  Surgeon: Dorna Leitz, MD;  Location: [place];  Service: Orthopedics;  Laterality: Right;  . Shoulder arthroscopy with distal clavicle resection Right 12/21/2014    Procedure: SHOULDER ARTHROSCOPY WITH DISTAL CLAVICLE RESECTION;  Surgeon: Dorna Leitz, MD;  Location: [place];  Service: Orthopedics;  Laterality: Right;  . Shoulder arthroscopy with bicepstenotomy Right 12/21/2014    Procedure: SHOULDER ARTHROSCOPY WITH BICEPSTENOTOMY;  Surgeon: Dorna Leitz, MD;  Location: [place];  Service: Orthopedics;  Laterality: Right;  . Abdominal hysterectomy    . Posterior cervical fusion/foraminotomy N/A 01/25/2015    Procedure: POSTERIOR CERVICAL DECOMPRESSSION CERVICAL 7-THORACIC 1;  Surgeon: Phylliss Bob, MD;  Location: [place];  Service: Orthopedics;  Laterality: N/A;  Posterior cervical decompression, cervical 5-6, cervical 6-7, cervical 7-thoracic 1    Review of Systems 10 point systems review negative except as above.    Objective:   Physical Exam  BP 126/74 mmHg  Pulse 84  Temp(Src) 97.7 F (36.5 C)  Resp 16  Ht 5\' 5"  (1.651 m)  Wt 202 lb 12.8 oz (91.989 kg)  BMI 33.75 kg/m2  HEENT - Eac's patent. TM's Nl. EOM's full. PERRLA. NasoOroPharynx clear. Neck - supple. Nl  Thyroid. Carotids 2+ & No bruits, nodes, JVD Chest - Clear equal BS w/o Rales, rhonchi, wheezes. Cor - Nl HS. RRR w/o sig MGR. PP 1(+). No edema. Abd - No palpable organomegaly, masses or tenderness. BS nl. MS- FROM w/o deformities. Muscle power, tone and bulk Nl. Gait Nl. Neuro - No obvious Cr N abnormalities. Motor and Cerebellar functions appear Nl w/o focal abnormalities. Vibratory and touch is intact to the toes bilateral. Monofilament testing is spotty from the shins to the toes . KJ's and AJ's are equivocal bilat.  Plantar response is down going.  Psyche - Mental status normal & appropriate.  No delusions, ideations or obvious mood abnormalities.    Assessment & Plan:   1. Essential hypertension  - TSH  2. Hyperlipidemia  - Lipid panel - TSH  3. Prediabetes  - Hemoglobin A1c - Insulin, random  4. Vitamin D deficiency  - VITAMIN D 25 Hydroxy   5. Hypothyroidism   6. Obesity   7. Peripheral sensory neuropathy (HCC)  - increase   gabapentin  600 MG tablet; Take 1/2 to 1 tablet 3 to 4 x day for neuritis pains  Dispense: 120 tablet; Refill: 5 - Vitamin B12 - Ambulatory referral to Neurology  8. Fibromyalgia   9. Medication management  - CBC with Differential/Platelet - BASIC METABOLIC PANEL WITH GFR - Hepatic function panel - Magnesium

## 2015-05-10 NOTE — Patient Instructions (Signed)
Peripheral Neuropathy  Peripheral neuropathy is a type of nerve damage. It affects nerves that carry signals between the spinal cord and other parts of the body. These are called peripheral nerves. With peripheral neuropathy, one nerve or a group of nerves may be damaged.  CAUSES  Many things can damage peripheral nerves. For some people with peripheral neuropathy, the cause is unknown. Some causes include:  Diabetes. This is the most common cause of peripheral neuropathy.  Injury to a nerve.  Pressure or stress on a nerve that lasts a long time.  Too little vitamin B. Alcoholism can lead to this.  Infections.  Autoimmune diseases, such as multiple sclerosis and systemic lupus erythematosus.  Inherited nerve diseases.  Some medicines, such as cancer drugs.  Toxic substances, such as lead and mercury.  Too little blood flowing to the legs.  Kidney disease.  Thyroid disease. SIGNS AND SYMPTOMS  Different people have different symptoms. The symptoms you have will depend on which of your nerves is damaged. Common symptoms include:  Loss of feeling (numbness) in the feet and hands.  Tingling in the feet and hands.  Pain that burns.  Very sensitive skin.  Weakness.  Not being able to move a part of the body (paralysis).  Muscle twitching.  Clumsiness or poor coordination.  Loss of balance.  Not being able to control your bladder.  Feeling dizzy.  Sexual problems. DIAGNOSIS  Peripheral neuropathy is a symptom, not a disease. Finding the cause of peripheral neuropathy can be hard. To figure that out, your health care provider will take a medical history and do a physical exam. A neurological exam will also be done. This involves checking things affected by your brain, spinal cord, and nerves (nervous system). For example, your health care provider will check your reflexes, how you move, and what you can feel.  Other types of tests may also be ordered, such  as:  Blood tests.  A test of the fluid in your spinal cord.  Imaging tests, such as CT scans or an MRI.  Electromyography (EMG). This test checks the nerves that control muscles.  Nerve conduction velocity tests. These tests check how fast messages pass through your nerves.  Nerve biopsy. A small piece of nerve is removed. It is then checked under a microscope. TREATMENT   Medicine is often used to treat peripheral neuropathy. Medicines may include:  Pain-relieving medicines. Prescription or over-the-counter medicine may be suggested.  Antiseizure medicine. This may be used for pain.  Antidepressants. These also may help ease pain from neuropathy.  Lidocaine. This is a numbing medicine. You might wear a patch or be given a shot.  Mexiletine. This medicine is typically used to help control irregular heart rhythms.  Surgery. Surgery may be needed to relieve pressure on a nerve or to destroy a nerve that is causing pain.  Physical therapy to help movement.  Assistive devices to help movement. HOME CARE INSTRUCTIONS   Only take over-the-counter or prescription medicines as directed by your health care provider. Follow the instructions carefully for any given medicines. Do not take any other medicines without first getting approval from your health care provider.  If you have diabetes, work closely with your health care provider to keep your blood sugar under control.  If you have numbness in your feet:  Check every day for signs of injury or infection. Watch for redness, warmth, and swelling.  Wear padded socks and comfortable shoes. These help protect your feet.  Do not  do things that put pressure on your damaged nerve.  Do not smoke. Smoking keeps blood from getting to damaged nerves.  Avoid or limit alcohol. Too much alcohol can cause a lack of B vitamins. These vitamins are needed for healthy nerves.  Develop a good support system. Coping with peripheral neuropathy  can be stressful. Talk to a mental health specialist or join a support group if you are struggling.  Follow up with your health care provider as directed. SEEK MEDICAL CARE IF:   You have new signs or symptoms of peripheral neuropathy.  You are struggling emotionally from dealing with peripheral neuropathy.  You have a fever. SEEK IMMEDIATE MEDICAL CARE IF:   You have an injury or infection that is not healing.  You feel very dizzy or begin vomiting.  You have chest pain.  You have trouble breathing.   ++++++++++++++++++++++++++++++++ Recommend Adult Low Dose Aspirin or   coated  Aspirin 81 mg daily   To reduce risk of Colon Cancer 20 %,   Skin Cancer 26 % ,   Melanoma 46%   and   Pancreatic cancer 60%   ++++++++++++++++++++++++++++++++++++++++++++++++++++++ Vitamin D goal   is between 70-100.   Please make sure that you are taking your Vitamin D as directed.   It is very important as a natural anti-inflammatory   helping hair, skin, and nails, as well as reducing stroke and heart attack risk.   It helps your bones and helps with mood.  It also decreases numerous cancer risks so please take it as directed.   Low Vit D is associated with a 200-300% higher risk for CANCER   and 200-300% higher risk for HEART   ATTACK  &  STROKE.   .....................................Marland Kitchen  It is also associated with higher death rate at younger ages,   autoimmune diseases like Rheumatoid arthritis, Lupus, Multiple Sclerosis.     Also many other serious conditions, like depression, Alzheimer's  Dementia, infertility, muscle aches, fatigue, fibromyalgia - just to name a few.  ++++++++++++++++++++++++++++++++++++++++++++++++  Recommend the book "The END of DIETING" by Dr Excell Seltzer   & the book "The END of DIABETES " by Dr Excell Seltzer  At [email or web address] - get book & Audio CD's     Being diabetic has a  300% increased risk for heart attack, stroke, cancer, and alzheimer-  type vascular dementia. It is very important that you work harder with diet by avoiding all foods that are white. Avoid white rice (brown & wild rice is OK), white potatoes (sweetpotatoes in moderation is OK), White bread or wheat bread or anything made out of white flour like bagels, donuts, rolls, buns, biscuits, cakes, pastries, cookies, pizza crust, and pasta (made from white flour & egg whites) - vegetarian pasta or spinach or wheat pasta is OK. Multigrain breads like Arnold's or Pepperidge Farm, or multigrain sandwich thins or flatbreads.  Diet, exercise and weight loss can reverse and cure diabetes in the early stages.  Diet, exercise and weight loss is very important in the control and prevention of complications of diabetes which affects every system in your body, ie. Brain - dementia/stroke, eyes - glaucoma/blindness, heart - heart attack/heart failure, kidneys - dialysis, stomach - gastric paralysis, intestines - malabsorption, nerves - severe painful neuritis, circulation - gangrene & loss of a leg(s), and finally cancer and Alzheimers.    I recommend avoid fried & greasy foods,  sweets/candy, white rice (brown or wild rice or Quinoa is OK), white potatoes (sweet potatoes  are OK) - anything made from white flour - bagels, doughnuts, rolls, buns, biscuits,white and wheat breads, pizza crust and traditional pasta made of white flour & egg white(vegetarian pasta or spinach or wheat pasta is OK).  Multi-grain bread is OK - like multi-grain flat bread or sandwich thins. Avoid alcohol in excess. Exercise is also important.    Eat all the vegetables you want - avoid meat, especially red meat and dairy - especially cheese.  Cheese is the most concentrated form of trans-fats which is the worst thing to clog up our arteries. Veggie cheese is OK which can be found in the fresh produce section at [employer] or [employer] or [employer]  ++++++++++++++++++++++++++++++++++++++++++++++++++ DASH Eating  Plan  DASH stands for "Dietary Approaches to Stop Hypertension."   The DASH eating plan is a healthy eating plan that has been shown to reduce high blood pressure (hypertension). Additional health benefits may include reducing the risk of type 2 diabetes mellitus, heart disease, and stroke. The DASH eating plan may also help with weight loss.  WHAT DO I NEED TO KNOW ABOUT THE DASH EATING PLAN?  For the DASH eating plan, you will follow these general guidelines:  Choose foods with a percent daily value for sodium of less than 5% (as listed on the food label).  Use salt-free seasonings or herbs instead of table salt or sea salt.  Check with your health care provider or pharmacist before using salt substitutes.  Eat lower-sodium products, often labeled as "lower sodium" or "no salt added."  Eat fresh foods.  Eat more vegetables, fruits, and low-fat dairy products.    Choose whole grains. Look for the word "whole" as the first word in the ingredient list.  Choose fish   Limit sweets, desserts, sugars, and sugary drinks.  Choose heart-healthy fats.  Eat veggie cheese   Eat more home-cooked food and less restaurant, buffet, and fast food.  Limit fried foods.  Cook foods using methods other than frying.  Limit canned vegetables. If you do use them, rinse them well to decrease the sodium.  When eating at a restaurant, ask that your food be prepared with less salt, or no salt if possible.                      WHAT FOODS CAN I EAT?  Read Dr Fara Olden Fuhrman's books on The End of Dieting & The End of Diabetes  Grains  Whole grain or whole wheat bread. Brown rice. Whole grain or whole wheat pasta. Quinoa, bulgur, and whole grain cereals. Low-sodium cereals. Corn or whole wheat flour tortillas. Whole grain cornbread. Whole grain crackers. Low-sodium crackers.  Vegetables  Fresh or frozen vegetables (raw, steamed, roasted, or grilled). Low-sodium or reduced-sodium tomato and  vegetable juices. Low-sodium or reduced-sodium tomato sauce and paste. Low-sodium or reduced-sodium canned vegetables.   Fruits  All fresh, canned (in natural juice), or frozen fruits.  Protein Products   All fish and seafood.  Dried beans, peas, or lentils. Unsalted nuts and seeds. Unsalted canned beans.  Dairy  Low-fat dairy products, such as skim or 1% milk, 2% or reduced-fat cheeses, low-fat ricotta or cottage cheese, or plain low-fat yogurt. Low-sodium or reduced-sodium cheeses.  Fats and Oils  Tub margarines without trans fats. Light or reduced-fat mayonnaise and salad dressings (reduced sodium). Avocado. Safflower, olive, or canola oils. Natural peanut or almond butter.  Other  Unsalted popcorn and pretzels. The items listed above may not be a complete list  of recommended foods or beverages. Contact your dietitian for more options.  +++++++++++++++++++++++++++++++++++++++++++  WHAT FOODS ARE NOT RECOMMENDED?  Grains/ White flour or wheat flour  White bread. White pasta. White rice. Refined cornbread. Bagels and croissants. Crackers that contain trans fat.  Vegetables  Creamed or fried vegetables. Vegetables in a . Regular canned vegetables. Regular canned tomato sauce and paste. Regular tomato and vegetable juices.  Fruits  Dried fruits. Canned fruit in light or heavy syrup. Fruit juice.  Meat and Other Protein Products  Meat in general - RED mwaet & White meat.  Fatty cuts of meat. Ribs, chicken wings, bacon, sausage, bologna, salami, chitterlings, fatback, hot dogs, bratwurst, and packaged luncheon meats.  Dairy  Whole or 2% milk, cream, half-and-half, and cream cheese. Whole-fat or sweetened yogurt. Full-fat cheeses or blue cheese. Nondairy creamers and whipped toppings. Processed cheese, cheese spreads, or cheese curds.  Condiments  Onion and garlic salt, seasoned salt, table salt, and sea salt. Canned and packaged gravies. Worcestershire sauce. Tartar  sauce. Barbecue sauce. Teriyaki sauce. Soy sauce, including reduced sodium. Steak sauce. Fish sauce. Oyster sauce. Cocktail sauce. Horseradish. Ketchup and mustard. Meat flavorings and tenderizers. Bouillon cubes. Hot sauce. Tabasco sauce. Marinades. Taco seasonings. Relishes.  Fats and Oils Butter, stick margarine, lard, shortening and bacon fat. Coconut, palm kernel, or palm oils. Regular salad dressings.  Pickles and olives. Salted popcorn and pretzels.  The items listed above may not be a complete list of foods and beverages to avoid.

## 2015-05-11 LAB — VITAMIN D 25 HYDROXY (VIT D DEFICIENCY, FRACTURES): Vit D, 25-Hydroxy: 83 ng/mL (ref 30–100)

## 2015-05-11 LAB — INSULIN, RANDOM: Insulin: 4.5 u[IU]/mL (ref 2.0–19.6)

## 2015-05-11 LAB — VITAMIN B12: Vitamin B-12: 398 pg/mL (ref 211–911)

## 2015-05-11 LAB — TSH: TSH: 2.616 u[IU]/mL (ref 0.350–4.500)

## 2015-06-02 ENCOUNTER — Ambulatory Visit (INDEPENDENT_AMBULATORY_CARE_PROVIDER_SITE_OTHER): Payer: BLUE CROSS/BLUE SHIELD | Admitting: Neurology

## 2015-06-02 ENCOUNTER — Encounter: Payer: Self-pay | Admitting: Neurology

## 2015-06-02 VITALS — BP 120/84 | HR 85 | Ht 65.0 in | Wt 199.1 lb

## 2015-06-02 DIAGNOSIS — R52 Pain, unspecified: Secondary | ICD-10-CM

## 2015-06-02 DIAGNOSIS — M79601 Pain in right arm: Secondary | ICD-10-CM

## 2015-06-02 DIAGNOSIS — R202 Paresthesia of skin: Secondary | ICD-10-CM

## 2015-06-02 DIAGNOSIS — G8929 Other chronic pain: Secondary | ICD-10-CM | POA: Diagnosis not present

## 2015-06-02 NOTE — Progress Notes (Signed)
[facility] Clinic Note - Initial Visit   Date: 06/05/2015  [name] MRN: [id number] DOB: 06-03-1960   Dear Dr. Melford Aase:  Thank you for your kind referral of [name] for consultation of generalized paresthesias. Although her history is well known to you, please allow [place] to reiterate it for the purpose of our medical record. The patient was accompanied to the clinic by self.    History of Present Illness: [name] is a 55 y.o. right-handed Caucasian female with hypothyroidism, hypertension, RLS, COPD, history of bladder cancer (2016), s/p cervical ACDF C5-C6, and anxiety/depression presenting for evaluation of paresthesias involving the hands and feet.    She reports having a fall in 2015 and reports breaking her sacrum.  Immediately, following the fall, she developed tingling/cold sensation of the right arm with cold sensation and right leg numbness/tingling over the thigh and feet.  Symptoms are constant and worse if standing or if sitting on her sacrum.  Her arm discomfort is worse if resting it on any surface.  She takes gabapentin but does not have any relief.   She has previously tried Lyrica, flexeril, and PT without any benefit.  MRI cervical spine showed foraminal stenosis at right C7-T1 for which she underwent right C7-T1 foraminotomy by Dr. Lynann Bologna in October 2016, but denies any improvement.  She saw her orthopeadic specialist, Dr. Lynann Bologna, who has performed updated MRI cervical spine and NCS/EMG which per report, does not show any abnormalities to explain the severity of her symptoms.  I will request these records for me to review.  She is referred for further evaluation.  She also complains of right leg pain and numbness which is worse over the back of her right thigh and leg, which is worse with weight bearing.  Recent MRI lumbar spine did not show evidence of nerve compression, per patient's report.  She also complains of  bilateral feet coldness.    She is very frustrated at the lack of answers and pain relief.   No history of alcoholism.  She is prediabetic managing with life style modification.   Out-side paper records, electronic medical record, and images have been reviewed where available and summarized as:  MRI cervical spine 08/08/2013: The dominant abnormality is at C5-6 where a central disc protrusion with bilateral uncinate spurring results in mild canal stenosis and bilateral C6 nerve root impingement. This is not dramatically worse on the left, but there is slight progression from most recent prior.  MRI lumbar spine 01/25/2013:  Degenerative change of the lumbar spine. Small L4-5 superimposed disc protrusion with annular tear. No canal stenosis. Neural foraminal narrowing L3-4 through L5  Lab Results  Component Value Date   TSH 2.616 05/10/2015   Lab Results  Component Value Date   [id number] 05/10/2015   Lab Results  Component Value Date   HGBA1C 5.9* 05/10/2015   Lab Results  Component Value Date   ESRSEDRATE 9 02/17/2014      Past Medical History  Diagnosis Date  . Fibromyalgia   . Hyperlipidemia   . COPD (chronic obstructive pulmonary disease) ([place])   . Prediabetes   . Restless leg syndrome   . Hypertension     normal now, only took medications for 1 month- thinks it was from pain.  Marland Kitchen Hypothyroidism   . Hemorrhoid   . Anxiety   . Depression   . ADD (attention deficit disorder)   . Bladder neoplasm   . History of melanoma excision  leg  . Iron deficiency anemia   . Cancer [facility]) 2015    Bladder cancer  . Shortness of breath dyspnea     with exertion  . H/O bronchitis     treated within the last week w steroids, antibx tessalon pearls.   . SUI (stress urinary incontinence, female)   . Fibroids   . Wears glasses   . H/O sebaceous cyst 07/18/14    left breast   . Shoulder impingement     right  . PONV (postoperative nausea and vomiting)     used a  scop patch last surg-did well    Past Surgical History  Procedure Laterality Date  . Ankle fusion Right 2011  . Knee arthroscopy with medial menisectomy Right 05/22/2012    Procedure: KNEE ARTHROSCOPY WITH MEDIAL MENISECTOMY and Medial Plica excision;  Surgeon: Alta Corning, MD;  Location: [place];  Service: Orthopedics;  Laterality: Right;  . Anterior cervical decomp/discectomy fusion N/A 09/01/2013    Procedure: Anterior cervical decompression fusion, cervical 5-6 with instrumentation and allograft;  Surgeon: Sinclair Ship, MD;  Location: [facility];  Service: Orthopedics;  Laterality: N/A;  Anterior cervical decompression fusion, cervical 5-6 with instrumentation and allograft  . Shoulder arthroscopy with subacromial decompression Left 11/10/2013    Procedure: SHOULDER ARTHROSCOPY WITH SUBACROMIAL DECOMPRESSION WITH ROTATOR CUFF REPAIR AND BICEPS TENODESIS;  Surgeon: Alta Corning, MD;  Location: [place];  Service: Orthopedics;  Laterality: Left;  . Closed reduction and pinning left fifth  proximal phalangeal fx  10-09-2005  . D & c hysteroscopy with novasure endometrial ablation  07-16-2006  . Shoulder arthroscopy w/ subacromial decompression and distal clavicle excision Left 11-27-2011    W/  DEBRIDEMENT  . Cataract extraction w/ intraocular lens implant Bilateral   . Tonsillectomy  age 55  . Laparoscopic cholecystectomy  2005  . Tubal ligation  1980  . Transurethral resection of bladder tumor N/A 01/11/2014    Procedure: TRANSURETHRAL RESECTION OF BLADDER TUMOR (TURBT);  Surgeon: Festus Aloe, MD;  Location: [facility];  Service: Urology;  Laterality: N/A;  . Cystoscopy w/ retrogrades Left 01/11/2014    Procedure: CYSTOSCOPY WITH RETROGRADE PYELOGRAM;  Surgeon: Festus Aloe, MD;  Location: [facility];  Service: Urology;  Laterality: Left;  . Cystoscopy with stent placement Left 01/11/2014    Procedure: CYSTOSCOPY  WITH STENT PLACEMENT;  Surgeon: Festus Aloe, MD;  Location: [facility];  Service: Urology;  Laterality: Left;  . Laparoscopic assisted vaginal hysterectomy N/A 08/29/2014    Procedure: LAPAROSCOPIC ASSISTED VAGINAL HYSTERECTOMY;  Surgeon: Molli Posey, MD;  Location: [facility];  Service: Gynecology;  Laterality: N/A;  . Salpingoophorectomy Bilateral 08/29/2014    Procedure: SALPINGO OOPHORECTOMY;  Surgeon: Molli Posey, MD;  Location: [facility];  Service: Gynecology;  Laterality: Bilateral;  . Pubovaginal sling N/A 08/29/2014    Procedure: Okey Dupre SINGLE INCISION SLING;  Surgeon: Molli Posey, MD;  Location: [facility];  Service: Gynecology;  Laterality: N/A;  . Laparotomy N/A 08/29/2014    Procedure: EXPLORATORY LAPAROTOMY WITH LIGATION OF BLEEDER;  Surgeon: Molli Posey, MD;  Location: [facility];  Service: Gynecology;  Laterality: N/A;  . Shoulder arthroscopy with subacromial decompression Right 12/21/2014    Procedure: SHOULDER ARTHROSCOPY WITH SUBACROMIAL DECOMPRESSION;  Surgeon: Dorna Leitz, MD;  Location: [place];  Service: Orthopedics;  Laterality: Right;  . Shoulder arthroscopy with distal clavicle resection Right 12/21/2014    Procedure: SHOULDER  ARTHROSCOPY WITH DISTAL CLAVICLE RESECTION;  Surgeon: Dorna Leitz, MD;  Location: [place];  Service: Orthopedics;  Laterality: Right;  . Shoulder arthroscopy with bicepstenotomy Right 12/21/2014    Procedure: SHOULDER ARTHROSCOPY WITH BICEPSTENOTOMY;  Surgeon: Dorna Leitz, MD;  Location: [place];  Service: Orthopedics;  Laterality: Right;  . Abdominal hysterectomy    . Posterior cervical fusion/foraminotomy N/A 01/25/2015    Procedure: POSTERIOR CERVICAL DECOMPRESSSION CERVICAL 7-THORACIC 1;  Surgeon: Phylliss Bob, MD;  Location: [place];  Service: Orthopedics;  Laterality: N/A;  Posterior cervical decompression, cervical 5-6,  cervical 6-7, cervical 7-thoracic 1      Medications:  Outpatient Encounter Prescriptions as of 06/02/2015  Medication Sig Note  . albuterol (VENTOLIN HFA) 108 (90 BASE) MCG/ACT inhaler Inhale 2 puffs into the lungs every 4 (four) hours as needed for wheezing or shortness of breath.   . amphetamine-dextroamphetamine (ADDERALL) 20 MG tablet Take 1/2 to 1 tablet 1 or 2 x day only if needed for ADD   . benzonatate (TESSALON PERLES) 100 MG capsule Take 1 capsule (100 mg total) by mouth every 6 (six) hours as needed for cough.   . cholecalciferol (VITAMIN D) 1000 UNITS tablet Take 7,000 Units by mouth at bedtime.  08/01/2013: .  . clonazePAM (KLONOPIN) 2 MG tablet TAKE ONE TABLET BY MOUTH TWICE DAILY   . escitalopram (LEXAPRO) 20 MG tablet Take 1 tablet (20 mg total) by mouth daily.   . furosemide (LASIX) 20 MG tablet Take 20-40 mg by mouth daily as needed for fluid.   Marland Kitchen gabapentin (NEURONTIN) 600 MG tablet Take 1/2 to 1 tablet 3 to 4 x day for neuritis pains   . levothyroxine (SYNTHROID, LEVOTHROID) 75 MCG tablet Take 1 tablet (75 mcg total) by mouth daily before breakfast.   . MAGNESIUM PO Take 450 mg by mouth daily.   Marland Kitchen triamterene-hydrochlorothiazide (MAXZIDE-25) 37.5-25 MG tablet Take 1 tablet by mouth daily.    No facility-administered encounter medications on file as of 06/02/2015.     Allergies:  Allergies  Allergen Reactions  . Sulfa Antibiotics Hives and Itching    Family History: Family History  Problem Relation Age of Onset  . Cancer Father     Lung and stomach   . Heart disease Father   . Hypertension Father   . Stroke Father   . Heart failure Paternal Grandfather   . Cancer Paternal Grandfather     colon cancer  . Mental retardation Son   . Ovarian cancer Mother 68  . Breast cancer Mother     dx in her late 84s-50s  . Thyroid cancer Mother   . Ovarian cancer Sister 16  . Cancer Brother     abdominal tumor  . Ovarian cancer Maternal Aunt   . Breast cancer  Maternal Aunt   . Breast cancer Maternal Uncle   . Breast cancer Paternal Aunt   . Ovarian cancer Sister 62  . Ovarian cancer Sister 68  . Cancer Brother     abdominal tumor  . Breast cancer Paternal Aunt   . Breast cancer Paternal Aunt   . Breast cancer Paternal Aunt   . Breast cancer Cousin   . Breast cancer Cousin     Social History: Social History  Substance Use Topics  . Smoking status: Former Smoker -- 1.00 packs/day for 30 years    Types: Cigarettes    Quit date: 06/24/2009  . Smokeless tobacco: Never Used  . Alcohol Use: No   Social  History   Social History Narrative   Lives with husband in a one story home.  Has 2 children.     Not currently working.  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   Trying to get disability.     Education: 10th grade.    Review of Systems:  CONSTITUTIONAL: No fevers, chills, night sweats, or weight loss.   EYES: No visual changes or eye pain ENT: No hearing changes.  No history of nose bleeds.   RESPIRATORY: No cough, wheezing and shortness of breath.   CARDIOVASCULAR: Negative for chest pain, and palpitations.   GI: Negative for abdominal discomfort, blood in stools or black stools.  No recent change in bowel habits.   GU:  No history of incontinence.   MUSCLOSKELETAL: +history of joint pain or swelling.  +myalgias.   SKIN: Negative for lesions, rash, and itching.   HEMATOLOGY/ONCOLOGY: Negative for prolonged bleeding, bruising easily, and swollen nodes.   ENDOCRINE: Negative for cold or heat intolerance, polydipsia or goiter.   PSYCH:  +depression or anxiety symptoms.   NEURO: As Above.   Vital Signs:  BP 120/84 mmHg  Pulse 85  Ht 5\' 5"  (1.651 m)  Wt 199 lb 1 oz (90.294 kg)  BMI 33.13 kg/m2  SpO2 97%   General Medical Exam:   General:  Anxious, tearful at times.   Eyes/ENT: see cranial nerve examination.   Neck: No masses appreciated.  Full range of motion without tenderness.  No carotid  bruits. Respiratory:  Clear to auscultation, good air entry bilaterally.   Cardiac:  Regular rate and rhythm, no murmur.   Extremities:  No deformities, edema, or skin discoloration.  Right shoulder range of motion is limited. Skin:  No rashes or lesions.  Neurological Exam: MENTAL STATUS including orientation to time, place, person, recent and remote memory, attention span and concentration, language, and fund of knowledge is normal.  Speech is not dysarthric.  CRANIAL NERVES: II:  No visual field defects.  Unremarkable fundi.   III-IV-VI: Pupils equal round and reactive to light.  Normal conjugate, extra-ocular eye movements in all directions of gaze.  No nystagmus.  No ptosis.   V:  Normal facial sensation.    VII:  Normal facial symmetry and movements.  No pathologic facial reflexes.  VIII:  Normal hearing and vestibular function.   IX-X:  Normal palatal movement.   XI:  Normal shoulder shrug and head rotation.   XII:  Normal tongue strength and range of motion, no deviation or fasciculation.  MOTOR:  No atrophy, fasciculations or abnormal movements.  No pronator drift.  Tone is normal.    Right Upper Extremity:    Left Upper Extremity:    Deltoid  5/5   Deltoid  5/5   Biceps  5/5   Biceps  5/5   Triceps  5/5   Triceps  5/5   Wrist extensors  5/5   Wrist extensors  5/5   Wrist flexors  5/5   Wrist flexors  5/5   Finger extensors  5/5   Finger extensors  5/5   Finger flexors  5/5   Finger flexors  5/5   Dorsal interossei  5/5   Dorsal interossei  5/5   Abductor pollicis  5/5   Abductor pollicis  5/5   Tone (Ashworth scale)  0  Tone (Ashworth scale)  0   Right Lower Extremity:    Left Lower Extremity:    Hip flexors  5/5  Hip flexors  5/5   Hip extensors  5/5   Hip extensors  5/5   Knee flexors  5/5   Knee flexors  5/5   Knee extensors  5/5   Knee extensors  5/5   Dorsiflexors  5/5   Dorsiflexors  5/5   Plantarflexors  5/5   Plantarflexors  5/5   Toe extensors  5/5   Toe  extensors  5/5   Toe flexors  5/5   Toe flexors  5/5   Tone (Ashworth scale)  0  Tone (Ashworth scale)  0   MSRs:  Right                                                                 Left brachioradialis 2+  brachioradialis 2+  biceps 2+  biceps 2+  triceps 2+  triceps 2+  patellar 2+  patellar 2+  ankle jerk 1+  ankle jerk 1+  Hoffman no  Hoffman no  plantar response down  plantar response down   SENSORY: Temperature and vibration is reduced distal to ankles bilaterally.  She has patchy sensory loss over the right arm.  Romberg's sign absent.   COORDINATION/GAIT: Normal finger-to- nose-finger. Intact rapid alternating movements bilaterally. Gait is antalgic.   IMPRESSION: Ms. Gano is a 55 year-old female referred for generalized paresthesias of the right entire arm, right leg, and bilateral feet.  She had imaging of the cervical and lumbar spine which does not seem to show any new or worsening nerve impingement.  She also had NCS/EMG which was normal.  I will request for these images and reports to be sent for me to review personally.  I explained that she can have residual pain from cervical radiculopathy if there is injury to the nerves and the goal of surgery is to prevent worsening of pain or weakness, but may not always alleviate preexisting right arm pain.    Bilateral feet dysesthesias can be early manifestation of neuropathy or S1 radiculopathy, but her NCS/EMG would be expected to be abnormal if this was the case.  For completeness, I will check peripheral vascular studies.    PLAN/RECOMMENDATIONS:  1.  Check vascular studies of the legs 2.  Request EMG and MRI reports from [place] 3.  Nortriptyline declined 4.  She is very tearful that no one can provides an explanation of her pain and frustrated at the lack of pain relief, and she does not wish to try other medications.  I discussed seeing a counselor for coping mechanisms, which she states has been  recommended previously for her mood, but she does not feel that it would help.  Going forward, she may need pain management for symptomatic management, especially if we are unable to isolate areas of any nerve injury.  Further recommendations pending my review of her imaging and electrodiagnostic studies.   The duration of this appointment visit was 50 minutes of face-to-face time with the patient.  Greater than 50% of this time was spent in counseling, explanation of diagnosis, planning of further management, and coordination of care.   Thank you for allowing me to participate in patient's care.  If I can answer any additional questions, I would be pleased to do so.    Sincerely,  Amun Stemm K. Posey Pronto, DO

## 2015-06-02 NOTE — Patient Instructions (Addendum)
1.  We will request records from [facility] 2.  Check vascular studies of the legs 3.  Recommend seeing a counselor for coping mechanisms 4.  We will call you with my recommendations after I have reviewed your previous records.

## 2015-06-05 NOTE — Progress Notes (Signed)
Note faxed.  LM for medical records to call me back.

## 2015-06-10 ENCOUNTER — Other Ambulatory Visit: Payer: Self-pay | Admitting: Physician Assistant

## 2015-06-14 ENCOUNTER — Ambulatory Visit (HOSPITAL_COMMUNITY)
Admission: RE | Admit: 2015-06-14 | Discharge: 2015-06-14 | Disposition: A | Discharge status: Home / Self Care | Payer: BLUE CROSS/BLUE SHIELD | Source: Ambulatory Visit | Attending: Cardiovascular Disease | Admitting: Cardiovascular Disease

## 2015-06-14 ENCOUNTER — Other Ambulatory Visit: Payer: Self-pay | Admitting: Orthopedic Surgery

## 2015-06-14 ENCOUNTER — Other Ambulatory Visit: Payer: Self-pay | Admitting: Physician Assistant

## 2015-06-14 DIAGNOSIS — R938 Abnormal findings on diagnostic imaging of other specified body structures: Secondary | ICD-10-CM | POA: Insufficient documentation

## 2015-06-14 DIAGNOSIS — R202 Paresthesia of skin: Secondary | ICD-10-CM | POA: Insufficient documentation

## 2015-06-14 DIAGNOSIS — R52 Pain, unspecified: Secondary | ICD-10-CM | POA: Insufficient documentation

## 2015-06-14 DIAGNOSIS — I10 Essential (primary) hypertension: Secondary | ICD-10-CM | POA: Diagnosis not present

## 2015-06-14 DIAGNOSIS — R7303 Prediabetes: Secondary | ICD-10-CM | POA: Insufficient documentation

## 2015-06-14 DIAGNOSIS — G8929 Other chronic pain: Secondary | ICD-10-CM | POA: Diagnosis not present

## 2015-06-14 DIAGNOSIS — R209 Unspecified disturbances of skin sensation: Secondary | ICD-10-CM | POA: Diagnosis not present

## 2015-06-14 DIAGNOSIS — E785 Hyperlipidemia, unspecified: Secondary | ICD-10-CM | POA: Insufficient documentation

## 2015-06-14 DIAGNOSIS — M5412 Radiculopathy, cervical region: Secondary | ICD-10-CM

## 2015-06-21 ENCOUNTER — Ambulatory Visit
Admission: RE | Admit: 2015-06-21 | Discharge: 2015-06-21 | Disposition: A | Discharge status: Home / Self Care | Payer: BLUE CROSS/BLUE SHIELD | Source: Ambulatory Visit | Attending: Orthopedic Surgery | Admitting: Orthopedic Surgery

## 2015-06-21 DIAGNOSIS — M5412 Radiculopathy, cervical region: Secondary | ICD-10-CM

## 2015-06-23 ENCOUNTER — Other Ambulatory Visit: Payer: Self-pay | Admitting: *Deleted

## 2015-06-23 DIAGNOSIS — R202 Paresthesia of skin: Secondary | ICD-10-CM

## 2015-06-27 ENCOUNTER — Other Ambulatory Visit: Payer: Self-pay | Admitting: Physician Assistant

## 2015-06-27 ENCOUNTER — Ambulatory Visit (INDEPENDENT_AMBULATORY_CARE_PROVIDER_SITE_OTHER): Payer: BLUE CROSS/BLUE SHIELD | Admitting: Neurology

## 2015-06-27 ENCOUNTER — Other Ambulatory Visit: Payer: Self-pay | Admitting: Orthopedic Surgery

## 2015-06-27 DIAGNOSIS — M797 Fibromyalgia: Secondary | ICD-10-CM

## 2015-06-27 DIAGNOSIS — F988 Other specified behavioral and emotional disorders with onset usually occurring in childhood and adolescence: Secondary | ICD-10-CM

## 2015-06-27 DIAGNOSIS — R202 Paresthesia of skin: Secondary | ICD-10-CM

## 2015-06-27 MED ORDER — AMPHETAMINE-DEXTROAMPHETAMINE 20 MG PO TABS: ORAL_TABLET | ORAL | Status: DC

## 2015-06-27 NOTE — Procedures (Signed)
[facility] Neurology  [place], [place]  [place],  [address] Tel: [phone] Fax:  [phone] Test Date:  06/27/2015  Patient: [name] DOB: 01/05/1961 Physician: Narda Amber  Sex: Female Height: 5\' 4"  Ref Phys: Narda Amber  ID#: [id number] Temp: 33.2C Technician: Jerilynn Mages. Dean   Patient Complaints: This is a 55 year old female referred for evaluation of lower back pain that radiates into right hip and leg and numbness involving upper and lower extremities.  NCV & EMG Findings: Extensive electrodiagnostic testing of the right upper and lower extremity shows: 1. Right median, ulnar, and palmar sensory responses are within normal limits. 2. Right median and ulnar motor responses are within normal limits. 3. Right sural and superficial peroneal sensory responses are within normal limits. 4. Right tibial and peroneal motor responses are within normal limits. 5. Right tibial H reflex studies within normal limits. 6. There is no evidence of active or chronic motor axon loss changes affecting any of the tested muscles. Motor unit configuration and recruitment pattern is within normal limits.  Impression: This is a normal study of the right upper and lower extremities. In particular, there is no evidence of a large fiber sensorimotor polyneuropathy, cervical/lumbosacral radiculopathy, carpal tunnel syndrome affecting the right side.   _____________________________ Narda Amber, D.O.    Nerve Conduction Studies Anti Sensory Summary Table   Stim Site NR Peak (ms) Norm Peak (ms) P-T Amp (V) Norm P-T Amp  Right Median Anti Sensory (2nd Digit)  Wrist    3.4 <3.6 15.3 >15  Right Sup Peroneal Anti Sensory (Ant Lat Mall)  33.6C  12 cm    3.1 <4.6 6.3 >4  Right Sural Anti Sensory (Lat Mall)  33.6C  Calf    3.8 <4.6 5.8 >4  Right Ulnar Anti Sensory (5th Digit)  Wrist    2.8 <3.1 17.6 >10   Motor Summary Table   Stim Site NR Onset (ms) Norm Onset (ms) O-P Amp  (mV) Norm O-P Amp Site1 Site2 Delta-0 (ms) Dist (cm) Vel (m/s) Norm Vel (m/s)  Right Median Motor (Abd Poll Brev)  Wrist    3.2 <4.0 8.4 >6 Elbow Wrist 3.5 20.0 57 >50  Elbow    6.7  8.3         Right Peroneal Motor (Ext Dig Brev)  33.6C  Ankle    2.7 <6.0 6.2 >2.5 B Fib Ankle 6.1 29.0 48 >40  B Fib    8.8  5.8  Poplt B Fib 1.7 10.0 59 >40  Poplt    10.5  5.8         Right Tibial Motor (Abd Hall Brev)  33.6C  Ankle    3.0 <6.0 6.3 >4 Knee Ankle 8.5 38.0 45 >40  Knee    11.5  6.1         Right Ulnar Motor (Abd Dig Minimi)  33.6C  Wrist    2.9 <3.1 8.2 >7 B Elbow Wrist 3.0 18.0 60 >50  B Elbow    5.9  8.0  A Elbow B Elbow 1.5 10.0 67 >50  A Elbow    7.4  7.8          Comparison Summary Table   Stim Site NR Peak (ms) Norm Peak (ms) P-T Amp (V) Site1 Site2 Delta-P (ms) Norm Delta (ms)  Right Median/Ulnar Palm Comparison (Wrist - 8cm)  33.6C  Median Palm    1.8 <2.2 40.5 Median Palm Ulnar Palm 0.1   Ulnar [employer]  1.7 <2.2 14.4       H Reflex Studies   NR H-Lat (ms) Lat Norm (ms) L-R H-Lat (ms) M-Lat (ms) HLat-MLat (ms)  Right Tibial (Gastroc)  33.6C     28.98 <35  3.67 25.31   EMG   Side Muscle Ins Act Fibs Psw Fasc Number Recrt Dur Dur. Amp Amp. Poly Poly. Comment  Right AntTibialis Nml Nml Nml Nml Nml Nml Nml Nml Nml Nml Nml Nml N/A  Right Gastroc Nml Nml Nml Nml Nml Nml Nml Nml Nml Nml Nml Nml N/A  Right Flex Dig Long Nml Nml Nml Nml Nml Nml Nml Nml Nml Nml Nml Nml N/A  Right RectFemoris Nml Nml Nml Nml Nml Nml Nml Nml Nml Nml Nml Nml N/A  Right BicepsFemS Nml Nml Nml Nml Nml Nml Nml Nml Nml Nml Nml Nml N/A  Right GluteusMed Nml Nml Nml Nml Nml Nml Nml Nml Nml Nml Nml Nml N/A  Right 1stDorInt Nml Nml Nml Nml Nml Nml Nml Nml Nml Nml Nml Nml N/A  Right Ext Indicis Nml Nml Nml Nml Nml Nml Nml Nml Nml Nml Nml Nml N/A  Right PronatorTeres Nml Nml Nml Nml Nml Nml Nml Nml Nml Nml Nml Nml N/A  Right Biceps Nml Nml Nml Nml Nml Nml Nml Nml Nml Nml Nml Nml N/A  Right Triceps Nml  Nml Nml Nml Nml Nml Nml Nml Nml Nml Nml Nml N/A  Right Deltoid Nml Nml Nml Nml Nml Nml Nml Nml Nml Nml Nml Nml N/A      Waveforms:

## 2015-06-28 ENCOUNTER — Encounter: Payer: Self-pay | Admitting: Physician Assistant

## 2015-07-05 ENCOUNTER — Encounter: Payer: Self-pay | Admitting: Physician Assistant

## 2015-07-06 ENCOUNTER — Other Ambulatory Visit: Payer: Self-pay | Admitting: Orthopedic Surgery

## 2015-07-06 ENCOUNTER — Encounter (HOSPITAL_BASED_OUTPATIENT_CLINIC_OR_DEPARTMENT_OTHER): Payer: Self-pay | Admitting: *Deleted

## 2015-07-10 ENCOUNTER — Encounter (HOSPITAL_BASED_OUTPATIENT_CLINIC_OR_DEPARTMENT_OTHER)
Admission: RE | Admit: 2015-07-10 | Discharge: 2015-07-10 | Disposition: A | Discharge status: Home / Self Care | Payer: BLUE CROSS/BLUE SHIELD | Source: Ambulatory Visit | Attending: Orthopedic Surgery | Admitting: Orthopedic Surgery

## 2015-07-10 DIAGNOSIS — J449 Chronic obstructive pulmonary disease, unspecified: Secondary | ICD-10-CM | POA: Diagnosis not present

## 2015-07-10 DIAGNOSIS — M75111 Incomplete rotator cuff tear or rupture of right shoulder, not specified as traumatic: Secondary | ICD-10-CM | POA: Diagnosis not present

## 2015-07-10 DIAGNOSIS — Z87891 Personal history of nicotine dependence: Secondary | ICD-10-CM | POA: Diagnosis not present

## 2015-07-10 DIAGNOSIS — F988 Other specified behavioral and emotional disorders with onset usually occurring in childhood and adolescence: Secondary | ICD-10-CM | POA: Diagnosis not present

## 2015-07-10 DIAGNOSIS — Z8551 Personal history of malignant neoplasm of bladder: Secondary | ICD-10-CM | POA: Diagnosis not present

## 2015-07-10 DIAGNOSIS — Z6832 Body mass index (BMI) 32.0-32.9, adult: Secondary | ICD-10-CM | POA: Diagnosis not present

## 2015-07-10 DIAGNOSIS — M797 Fibromyalgia: Secondary | ICD-10-CM | POA: Diagnosis not present

## 2015-07-10 DIAGNOSIS — Z7952 Long term (current) use of systemic steroids: Secondary | ICD-10-CM | POA: Diagnosis not present

## 2015-07-10 DIAGNOSIS — Z79899 Other long term (current) drug therapy: Secondary | ICD-10-CM | POA: Diagnosis not present

## 2015-07-10 DIAGNOSIS — M7541 Impingement syndrome of right shoulder: Secondary | ICD-10-CM | POA: Diagnosis not present

## 2015-07-10 DIAGNOSIS — E039 Hypothyroidism, unspecified: Secondary | ICD-10-CM | POA: Diagnosis not present

## 2015-07-10 DIAGNOSIS — I10 Essential (primary) hypertension: Secondary | ICD-10-CM | POA: Diagnosis not present

## 2015-07-10 DIAGNOSIS — Z85820 Personal history of malignant melanoma of skin: Secondary | ICD-10-CM | POA: Diagnosis not present

## 2015-07-10 DIAGNOSIS — R7303 Prediabetes: Secondary | ICD-10-CM | POA: Diagnosis not present

## 2015-07-10 DIAGNOSIS — Z5333 Arthroscopic surgical procedure converted to open procedure: Secondary | ICD-10-CM | POA: Diagnosis not present

## 2015-07-10 DIAGNOSIS — E669 Obesity, unspecified: Secondary | ICD-10-CM | POA: Diagnosis not present

## 2015-07-10 DIAGNOSIS — F329 Major depressive disorder, single episode, unspecified: Secondary | ICD-10-CM | POA: Diagnosis not present

## 2015-07-10 DIAGNOSIS — E785 Hyperlipidemia, unspecified: Secondary | ICD-10-CM | POA: Diagnosis not present

## 2015-07-10 DIAGNOSIS — G2581 Restless legs syndrome: Secondary | ICD-10-CM | POA: Diagnosis not present

## 2015-07-10 LAB — BASIC METABOLIC PANEL
Anion gap: 11 (ref 5–15)
BUN: 19 mg/dL (ref 6–20)
CO2: 25 mmol/L (ref 22–32)
Calcium: 9.6 mg/dL (ref 8.9–10.3)
Chloride: 105 mmol/L (ref 101–111)
Creatinine, Ser: 0.71 mg/dL (ref 0.44–1.00)
GFR calc Af Amer: >60 mL/min (ref >60)
GFR calc non Af Amer: >60 mL/min (ref >60)
Glucose, Bld: 96 mg/dL (ref 65–99)
Potassium: 4.7 mmol/L (ref 3.5–5.1)
Sodium: 141 mmol/L (ref 135–145)

## 2015-07-12 ENCOUNTER — Ambulatory Visit (HOSPITAL_BASED_OUTPATIENT_CLINIC_OR_DEPARTMENT_OTHER): Payer: BLUE CROSS/BLUE SHIELD | Admitting: Anesthesiology

## 2015-07-12 ENCOUNTER — Encounter (HOSPITAL_BASED_OUTPATIENT_CLINIC_OR_DEPARTMENT_OTHER): Payer: Self-pay | Admitting: Anesthesiology

## 2015-07-12 ENCOUNTER — Ambulatory Visit (HOSPITAL_BASED_OUTPATIENT_CLINIC_OR_DEPARTMENT_OTHER)
Admission: RE | Admit: 2015-07-12 | Discharge: 2015-07-12 | Disposition: A | Discharge status: Home / Self Care | Payer: BLUE CROSS/BLUE SHIELD | Source: Ambulatory Visit | Attending: Orthopedic Surgery | Admitting: Orthopedic Surgery

## 2015-07-12 ENCOUNTER — Ambulatory Visit (HOSPITAL_BASED_OUTPATIENT_CLINIC_OR_DEPARTMENT_OTHER)
Admission: RE | Disposition: A | Discharge status: Home / Self Care | Payer: BLUE CROSS/BLUE SHIELD | Source: Ambulatory Visit | Attending: Orthopedic Surgery

## 2015-07-12 DIAGNOSIS — M7541 Impingement syndrome of right shoulder: Secondary | ICD-10-CM | POA: Diagnosis not present

## 2015-07-12 DIAGNOSIS — M75111 Incomplete rotator cuff tear or rupture of right shoulder, not specified as traumatic: Secondary | ICD-10-CM | POA: Insufficient documentation

## 2015-07-12 DIAGNOSIS — Z87891 Personal history of nicotine dependence: Secondary | ICD-10-CM | POA: Insufficient documentation

## 2015-07-12 DIAGNOSIS — I10 Essential (primary) hypertension: Secondary | ICD-10-CM | POA: Insufficient documentation

## 2015-07-12 DIAGNOSIS — Z85820 Personal history of malignant melanoma of skin: Secondary | ICD-10-CM | POA: Insufficient documentation

## 2015-07-12 DIAGNOSIS — J449 Chronic obstructive pulmonary disease, unspecified: Secondary | ICD-10-CM | POA: Insufficient documentation

## 2015-07-12 DIAGNOSIS — M797 Fibromyalgia: Secondary | ICD-10-CM | POA: Insufficient documentation

## 2015-07-12 DIAGNOSIS — E785 Hyperlipidemia, unspecified: Secondary | ICD-10-CM | POA: Insufficient documentation

## 2015-07-12 DIAGNOSIS — F988 Other specified behavioral and emotional disorders with onset usually occurring in childhood and adolescence: Secondary | ICD-10-CM | POA: Insufficient documentation

## 2015-07-12 DIAGNOSIS — Z5333 Arthroscopic surgical procedure converted to open procedure: Secondary | ICD-10-CM | POA: Insufficient documentation

## 2015-07-12 DIAGNOSIS — Z7952 Long term (current) use of systemic steroids: Secondary | ICD-10-CM | POA: Insufficient documentation

## 2015-07-12 DIAGNOSIS — Z8551 Personal history of malignant neoplasm of bladder: Secondary | ICD-10-CM | POA: Insufficient documentation

## 2015-07-12 DIAGNOSIS — Z79899 Other long term (current) drug therapy: Secondary | ICD-10-CM | POA: Insufficient documentation

## 2015-07-12 DIAGNOSIS — G2581 Restless legs syndrome: Secondary | ICD-10-CM | POA: Insufficient documentation

## 2015-07-12 DIAGNOSIS — R7303 Prediabetes: Secondary | ICD-10-CM | POA: Insufficient documentation

## 2015-07-12 DIAGNOSIS — Z6832 Body mass index (BMI) 32.0-32.9, adult: Secondary | ICD-10-CM | POA: Insufficient documentation

## 2015-07-12 DIAGNOSIS — E669 Obesity, unspecified: Secondary | ICD-10-CM | POA: Insufficient documentation

## 2015-07-12 DIAGNOSIS — F329 Major depressive disorder, single episode, unspecified: Secondary | ICD-10-CM | POA: Insufficient documentation

## 2015-07-12 DIAGNOSIS — E039 Hypothyroidism, unspecified: Secondary | ICD-10-CM | POA: Insufficient documentation

## 2015-07-12 HISTORY — PX: SHOULDER ARTHROSCOPY WITH OPEN ROTATOR CUFF REPAIR: SHX6092

## 2015-07-12 SURGERY — ARTHROSCOPY, SHOULDER WITH REPAIR, ROTATOR CUFF, OPEN
Anesthesia: Regional | Site: Shoulder | Laterality: Right

## 2015-07-12 MED ORDER — LIDOCAINE HCL (CARDIAC) 20 MG/ML IV SOLN
INTRAVENOUS | Status: AC
  Filled 2015-07-12: qty 5

## 2015-07-12 MED ORDER — PHENYLEPHRINE HCL 10 MG/ML IJ SOLN
INTRAMUSCULAR | Status: DC | PRN
  Administered 2015-07-12 (×4): 80 ug via INTRAVENOUS

## 2015-07-12 MED ORDER — ONDANSETRON HCL 4 MG/2ML IJ SOLN: 4.0000 mg | Freq: Once | INTRAMUSCULAR | Status: DC | PRN

## 2015-07-12 MED ORDER — FENTANYL CITRATE (PF) 100 MCG/2ML IJ SOLN
INTRAMUSCULAR | Status: AC
  Filled 2015-07-12: qty 2

## 2015-07-12 MED ORDER — EPHEDRINE SULFATE 50 MG/ML IJ SOLN
INTRAMUSCULAR | Status: DC | PRN
  Administered 2015-07-12: 10 mg via INTRAVENOUS

## 2015-07-12 MED ORDER — CHLORHEXIDINE GLUCONATE 4 % EX LIQD: 60.0000 mL | Freq: Once | CUTANEOUS | Status: DC

## 2015-07-12 MED ORDER — FENTANYL CITRATE (PF) 100 MCG/2ML IJ SOLN: 25.0000 ug | INTRAMUSCULAR | Status: DC | PRN

## 2015-07-12 MED ORDER — LIDOCAINE HCL (CARDIAC) 20 MG/ML IV SOLN
INTRAVENOUS | Status: DC | PRN
  Administered 2015-07-12: 50 mg via INTRAVENOUS

## 2015-07-12 MED ORDER — PROPOFOL 10 MG/ML IV BOLUS
INTRAVENOUS | Status: DC | PRN
  Administered 2015-07-12: 50 mg via INTRAVENOUS

## 2015-07-12 MED ORDER — NALOXONE HCL 0.4 MG/ML IJ SOLN
INTRAMUSCULAR | Status: AC
  Filled 2015-07-12: qty 1

## 2015-07-12 MED ORDER — DEXAMETHASONE SODIUM PHOSPHATE 4 MG/ML IJ SOLN
INTRAMUSCULAR | Status: DC | PRN
  Administered 2015-07-12: 10 mg via INTRAVENOUS

## 2015-07-12 MED ORDER — FENTANYL CITRATE (PF) 100 MCG/2ML IJ SOLN
INTRAMUSCULAR | Status: DC | PRN
  Administered 2015-07-12: 100 ug via INTRAVENOUS

## 2015-07-12 MED ORDER — PROPOFOL 10 MG/ML IV BOLUS
INTRAVENOUS | Status: AC
  Filled 2015-07-12: qty 20

## 2015-07-12 MED ORDER — SCOPOLAMINE 1 MG/3DAYS TD PT72
MEDICATED_PATCH | TRANSDERMAL | Status: DC | PRN
  Administered 2015-07-12: 1 via TRANSDERMAL

## 2015-07-12 MED ORDER — SUCCINYLCHOLINE CHLORIDE 20 MG/ML IJ SOLN
INTRAMUSCULAR | Status: DC | PRN
  Administered 2015-07-12: 50 mg via INTRAVENOUS

## 2015-07-12 MED ORDER — SCOPOLAMINE 1 MG/3DAYS TD PT72: 1.0000 | MEDICATED_PATCH | Freq: Once | TRANSDERMAL | Status: DC | PRN

## 2015-07-12 MED ORDER — CEFAZOLIN SODIUM-DEXTROSE 2-4 GM/100ML-% IV SOLN
INTRAVENOUS | Status: AC
  Filled 2015-07-12: qty 100

## 2015-07-12 MED ORDER — MIDAZOLAM HCL 2 MG/2ML IJ SOLN
1.0000 mg | INTRAMUSCULAR | Status: DC | PRN
  Administered 2015-07-12: 1 mg via INTRAVENOUS

## 2015-07-12 MED ORDER — FENTANYL CITRATE (PF) 100 MCG/2ML IJ SOLN
50.0000 ug | INTRAMUSCULAR | Status: DC | PRN
  Administered 2015-07-12: 50 ug via INTRAVENOUS

## 2015-07-12 MED ORDER — LACTATED RINGERS IV SOLN
INTRAVENOUS | Status: DC
  Administered 2015-07-12 (×2): via INTRAVENOUS

## 2015-07-12 MED ORDER — MIDAZOLAM HCL 2 MG/2ML IJ SOLN
INTRAMUSCULAR | Status: AC
  Filled 2015-07-12: qty 2

## 2015-07-12 MED ORDER — MIDAZOLAM HCL 5 MG/5ML IJ SOLN
INTRAMUSCULAR | Status: DC | PRN
  Administered 2015-07-12: 2 mg via INTRAVENOUS

## 2015-07-12 MED ORDER — ONDANSETRON HCL 4 MG/2ML IJ SOLN
INTRAMUSCULAR | Status: AC
  Filled 2015-07-12: qty 2

## 2015-07-12 MED ORDER — SUCCINYLCHOLINE CHLORIDE 20 MG/ML IJ SOLN
INTRAMUSCULAR | Status: AC
  Filled 2015-07-12: qty 1

## 2015-07-12 MED ORDER — DEXTROSE 5 % IV SOLN
2.0000 g | INTRAVENOUS | Status: AC
  Administered 2015-07-12: 2 g via INTRAVENOUS

## 2015-07-12 MED ORDER — TIZANIDINE HCL 2 MG PO TABS: 2.0000 mg | ORAL_TABLET | Freq: Three times a day (TID) | ORAL | Status: DC | PRN

## 2015-07-12 MED ORDER — SCOPOLAMINE 1 MG/3DAYS TD PT72
MEDICATED_PATCH | TRANSDERMAL | Status: AC
  Filled 2015-07-12: qty 1

## 2015-07-12 MED ORDER — OXYCODONE-ACETAMINOPHEN 5-325 MG PO TABS: 1.0000 | ORAL_TABLET | ORAL | Status: DC | PRN

## 2015-07-12 MED ORDER — BUPIVACAINE-EPINEPHRINE (PF) 0.5% -1:200000 IJ SOLN
INTRAMUSCULAR | Status: DC | PRN
  Administered 2015-07-12: 20 mL via PERINEURAL

## 2015-07-12 MED ORDER — GLYCOPYRROLATE 0.2 MG/ML IJ SOLN
0.2000 mg | Freq: Once | INTRAMUSCULAR | Status: AC | PRN
  Administered 2015-07-12 (×2): 0.2 mg via INTRAVENOUS

## 2015-07-12 MED ORDER — GLYCOPYRROLATE 0.2 MG/ML IJ SOLN
INTRAMUSCULAR | Status: AC
  Filled 2015-07-12: qty 2

## 2015-07-12 MED ORDER — DEXAMETHASONE SODIUM PHOSPHATE 10 MG/ML IJ SOLN
INTRAMUSCULAR | Status: AC
  Filled 2015-07-12: qty 1

## 2015-07-12 SURGICAL SUPPLY — 83 items
ANCH SUT SWLK 19.1X4.75 (Anchor) ×2 IMPLANT
ANCHOR SUT BIO SW 4.75X19.1 (Anchor) ×3 IMPLANT
APL SKNCLS STERI-STRIP NONHPOA (GAUZE/BANDAGES/DRESSINGS) ×2
BENZOIN TINCTURE PRP APPL 2/3 (GAUZE/BANDAGES/DRESSINGS) ×3 IMPLANT
BLADE SURG 15 STRL LF DISP TIS (BLADE) ×1 IMPLANT
BLADE SURG 15 STRL SS (BLADE) ×4
BLADE VORTEX 6.0 (BLADE) ×4 IMPLANT
CANNULA 5.75X71 LONG (CANNULA) IMPLANT
CANNULA TWIST IN 8.25X7CM (CANNULA) IMPLANT
CLOSURE WOUND 1/2 X4 (GAUZE/BANDAGES/DRESSINGS) ×1
CUTTER MENISCUS  4.2MM (BLADE) ×2
CUTTER MENISCUS 4.2MM (BLADE) ×2 IMPLANT
DECANTER SPIKE VIAL GLASS SM (MISCELLANEOUS) IMPLANT
DRAPE INCISE IOBAN 66X45 STRL (DRAPES) ×4 IMPLANT
DRAPE STERI 35X30 U-POUCH (DRAPES) ×4 IMPLANT
DRAPE SURG 17X23 STRL (DRAPES) ×4 IMPLANT
DRAPE U-SHAPE 47X51 STRL (DRAPES) ×4 IMPLANT
DRAPE U-SHAPE 76X120 STRL (DRAPES) ×8 IMPLANT
DRSG EMULSION OIL 3X3 NADH (GAUZE/BANDAGES/DRESSINGS) ×4 IMPLANT
DRSG PAD ABDOMINAL 8X10 ST (GAUZE/BANDAGES/DRESSINGS) ×8 IMPLANT
DURAPREP 26ML APPLICATOR (WOUND CARE) ×4 IMPLANT
ELECT REM PT RETURN 9FT ADLT (ELECTROSURGICAL) ×4
ELECTRODE REM PT RTRN 9FT ADLT (ELECTROSURGICAL) ×2 IMPLANT
GAUZE SPONGE 4X4 12PLY STRL (GAUZE/BANDAGES/DRESSINGS) ×4 IMPLANT
GLOVE BIO SURGEON STRL SZ 6.5 (GLOVE) ×2 IMPLANT
GLOVE BIO SURGEONS STRL SZ 6.5 (GLOVE) ×1
GLOVE BIOGEL PI IND STRL 7.0 (GLOVE) ×2 IMPLANT
GLOVE BIOGEL PI IND STRL 8 (GLOVE) ×4 IMPLANT
GLOVE BIOGEL PI INDICATOR 7.0 (GLOVE) ×4
GLOVE BIOGEL PI INDICATOR 8 (GLOVE) ×4
GLOVE ECLIPSE 7.5 STRL STRAW (GLOVE) ×8 IMPLANT
GOWN STRL REUS W/ TWL LRG LVL3 (GOWN DISPOSABLE) ×2 IMPLANT
GOWN STRL REUS W/ TWL XL LVL3 (GOWN DISPOSABLE) ×2 IMPLANT
GOWN STRL REUS W/TWL LRG LVL3 (GOWN DISPOSABLE) ×4
GOWN STRL REUS W/TWL XL LVL3 (GOWN DISPOSABLE) ×8 IMPLANT
IV NS IRRIG 3000ML ARTHROMATIC (IV SOLUTION) ×8 IMPLANT
MANIFOLD NEPTUNE II (INSTRUMENTS) ×4 IMPLANT
NDL 1/2 CIR CATGUT .05X1.09 (NEEDLE) IMPLANT
NDL HYPO 18GX1.5 BLUNT FILL (NEEDLE) ×1 IMPLANT
NDL SCORPION MULTI FIRE (NEEDLE) IMPLANT
NDL SUT 6 .5 CRC .975X.05 MAYO (NEEDLE) IMPLANT
NEEDLE 1/2 CIR CATGUT .05X1.09 (NEEDLE) ×4 IMPLANT
NEEDLE HYPO 18GX1.5 BLUNT FILL (NEEDLE) ×4 IMPLANT
NEEDLE MAYO TAPER (NEEDLE)
NEEDLE SCORPION MULTI FIRE (NEEDLE) ×4 IMPLANT
NS IRRIG 1000ML POUR BTL (IV SOLUTION) IMPLANT
PACK ARTHROSCOPY DSU (CUSTOM PROCEDURE TRAY) ×4 IMPLANT
PACK BASIN DAY SURGERY FS (CUSTOM PROCEDURE TRAY) ×4 IMPLANT
PASSER SUT SWANSON 36MM LOOP (INSTRUMENTS) IMPLANT
PENCIL BUTTON HOLSTER BLD 10FT (ELECTRODE) ×3 IMPLANT
SET IRRIG Y TYPE TUR BLADDER L (SET/KITS/TRAYS/PACK) ×4 IMPLANT
SLEEVE SCD COMPRESS KNEE MED (MISCELLANEOUS) ×4 IMPLANT
SLING ARM FOAM STRAP LRG (SOFTGOODS) ×3 IMPLANT
SLING ARM IMMOBILIZER LRG (SOFTGOODS) IMPLANT
SLING ARM MED ADULT FOAM STRAP (SOFTGOODS) ×3 IMPLANT
SLING ARM XL FOAM STRAP (SOFTGOODS) IMPLANT
SPONGE LAP 4X18 X RAY DECT (DISPOSABLE) IMPLANT
STRIP CLOSURE SKIN 1/2X4 (GAUZE/BANDAGES/DRESSINGS) ×2 IMPLANT
SUCTION FRAZIER HANDLE 10FR (MISCELLANEOUS)
SUCTION TUBE FRAZIER 10FR DISP (MISCELLANEOUS) IMPLANT
SUT ETHIBOND 2 OS 4 DA (SUTURE) IMPLANT
SUT ETHILON 4 0 PS 2 18 (SUTURE) IMPLANT
SUT MNCRL AB 3-0 PS2 18 (SUTURE) ×3 IMPLANT
SUT PDS AB 0 CT 36 (SUTURE) IMPLANT
SUT TICRON 1 T 12 (SUTURE) IMPLANT
SUT TIGER TAPE 7 IN WHITE (SUTURE) IMPLANT
SUT VIC AB 0 CT1 27 (SUTURE)
SUT VIC AB 0 CT1 27XBRD ANBCTR (SUTURE) IMPLANT
SUT VIC AB 1 CT1 27 (SUTURE) ×4
SUT VIC AB 1 CT1 27XBRD ANBCTR (SUTURE) ×1 IMPLANT
SUT VIC AB 2-0 SH 27 (SUTURE)
SUT VIC AB 2-0 SH 27XBRD (SUTURE) IMPLANT
SUT VIC AB 3-0 SH 27 (SUTURE) ×4
SUT VIC AB 3-0 SH 27X BRD (SUTURE) ×1 IMPLANT
SYR 5ML LL (SYRINGE) ×4 IMPLANT
TAPE FIBER 2MM 7IN #2 BLUE (SUTURE) ×3 IMPLANT
TOWEL OR 17X24 6PK STRL BLUE (TOWEL DISPOSABLE) ×4 IMPLANT
TOWEL OR NON WOVEN STRL DISP B (DISPOSABLE) ×4 IMPLANT
TUBE CONNECTING 20'X1/4 (TUBING)
TUBE CONNECTING 20X1/4 (TUBING) IMPLANT
WAND STAR VAC 90 (SURGICAL WAND) ×4 IMPLANT
WATER STERILE IRR 1000ML POUR (IV SOLUTION) ×4 IMPLANT
YANKAUER SUCT BULB TIP NO VENT (SUCTIONS) ×3 IMPLANT

## 2015-07-12 NOTE — Discharge Instructions (Signed)
Discharge Instructions after Arthroscopic Shoulder Repair   A sling has been provided for you. Remain in your sling at all times. This includes sleeping in your sling.  Use ice on the shoulder intermittently over the first 48 hours after surgery.  Pain medicine has been prescribed for you.  Use your medicine liberally over the first 48 hours, and then you can begin to taper your use. You may take Extra Strength Tylenol or Tylenol only in place of the pain pills. DO NOT take ANY nonsteroidal anti-inflammatory pain medications: Advil, Motrin, Ibuprofen, Aleve, Naproxen, or Narprosyn.   You may shower 5 days after surgery. The incisions CANNOT get wet. Do not rub the incisions. Make sure your axilla (armpit) is completely dry after showering.  Take one aspirin a day for 2 weeks after surgery, unless you have an aspirin sensitivity/ allergy or asthma.   Please call [phone] during normal business hours or [phone] after hours for any problems. Including the following:  - excessive redness of the incisions - drainage for more than 4 days - fever of more than 101.5 F  *Please note that pain medications will not be refilled after hours or on weekends.   Post Anesthesia Home Care Instructions  Activity: Get plenty of rest for the remainder of the day. A responsible adult should stay with you for 24 hours following the procedure.  For the next 24 hours, DO NOT: -Drive a car -Paediatric nurse -Drink alcoholic beverages -Take any medication unless instructed by your physician -Make any legal decisions or sign important papers.  Meals: Start with liquid foods such as gelatin or soup. Progress to regular foods as tolerated. Avoid greasy, spicy, heavy foods. If nausea and/or vomiting occur, drink only clear liquids until the nausea and/or vomiting subsides. Call your physician if vomiting continues.  Special Instructions/Symptoms: Your throat may feel dry or sore from the anesthesia or  the breathing tube placed in your throat during surgery. If this causes discomfort, gargle with warm salt water. The discomfort should disappear within 24 hours.  If you had a scopolamine patch placed behind your ear for the management of post- operative nausea and/or vomiting:  1. The medication in the patch is effective for 72 hours, after which it should be removed.  Wrap patch in a tissue and discard in the trash. Wash hands thoroughly with soap and water. 2. You may remove the patch earlier than 72 hours if you experience unpleasant side effects which may include dry mouth, dizziness or visual disturbances. 3. Avoid touching the patch. Wash your hands with soap and water after contact with the patch.

## 2015-07-12 NOTE — Anesthesia Procedure Notes (Addendum)
Anesthesia Regional Block:  Interscalene brachial plexus block  Pre-Anesthetic Checklist: ,, timeout performed, Correct Patient, Correct Site, Correct Laterality, Correct Procedure, Correct Position, site marked, Risks and benefits discussed,  Surgical consent,  Pre-op evaluation,  At surgeon's request and post-op pain management  Laterality: Right  Prep: chloraprep       Needles:  Injection technique: Single-shot  Needle Type: Echogenic Stimulator Needle     Needle Length: 5cm 5 cm Needle Gauge: 22 and 22 G    Additional Needles:  Procedures: ultrasound guided (picture in chart) Interscalene brachial plexus block Narrative:  Injection made incrementally with aspirations every 5 mL.  Performed by: Personally  Anesthesiologist: Catalina Gravel  Additional Notes: Functioning IV was confirmed and monitors were applied.  A 60mm 22ga echogenic stimulator needle was used. Sterile prep and drape, hand hygiene and sterile gloves were used.  Negative aspiration and negative test dose prior to incremental administration of local anesthetic. The patient tolerated the procedure well.  Ultrasound guidance: relevent anatomy identified, needle position confirmed, local anesthetic spread visualized around nerve(s), vascular puncture avoided.  Image printed for medical record.    Procedure Name: Intubation Date/Time: 07/12/2015 9:50 AM Performed by: Marrianne Mood Pre-anesthesia Checklist: Patient identified, Emergency Drugs available, Suction available, Patient being monitored and Timeout performed Patient Re-evaluated:Patient Re-evaluated prior to inductionOxygen Delivery Method: Circle System Utilized Preoxygenation: Pre-oxygenation with 100% oxygen Intubation Type: IV induction Ventilation: Mask ventilation without difficulty Laryngoscope Size: Miller and 3 Grade View: Grade II Tube type: Oral Tube size: 7.0 mm Number of attempts: 1 Airway Equipment and Method: Stylet and Oral  airway Placement Confirmation: ETT inserted through vocal cords under direct vision,  positive ETCO2 and breath sounds checked- equal and bilateral Secured at: 20 cm Tube secured with: Tape Dental Injury: Teeth and Oropharynx as per pre-operative assessment

## 2015-07-12 NOTE — Anesthesia Preprocedure Evaluation (Addendum)
Anesthesia Evaluation  Patient identified by MRN, date of birth, ID band Patient awake    Reviewed: Allergy & Precautions, NPO status , Patient's Chart, lab work & pertinent test results  History of Anesthesia Complications (+) PONV and history of anesthetic complications  Airway Mallampati: II  TM Distance: >3 FB Neck ROM: Full    Dental  (+) Teeth Intact, Dental Advisory Given, Caps,    Pulmonary shortness of breath and with exertion, COPD, former smoker,    Pulmonary exam normal breath sounds clear to auscultation       Cardiovascular hypertension, + Peripheral Vascular Disease  Normal cardiovascular exam Rhythm:Regular Rate:Normal     Neuro/Psych PSYCHIATRIC DISORDERS Anxiety Depression Bipolar Disorder  Neuromuscular disease (RUE numbness, tingling s/p ACDF)    GI/Hepatic Neg liver ROS, GERD  ,  Endo/Other  Hypothyroidism Obesity   Renal/GU negative Renal ROS     Musculoskeletal  (+) Fibromyalgia -  Abdominal   Peds  Hematology  (+) Blood dyscrasia, anemia ,   Anesthesia Other Findings Day of surgery medications reviewed with the patient.  Reproductive/Obstetrics                           Anesthesia Physical Anesthesia Plan  ASA: III  Anesthesia Plan: General and Regional   Post-op Pain Management: GA combined w/ Regional for post-op pain   Induction: Intravenous  Airway Management Planned: Oral ETT  Additional Equipment:   Intra-op Plan:   Post-operative Plan: Extubation in OR  Informed Consent: I have reviewed the patients History and Physical, chart, labs and discussed the procedure including the risks, benefits and alternatives for the proposed anesthesia with the patient or authorized representative who has indicated his/her understanding and acceptance.   Dental advisory given  Plan Discussed with: CRNA and Anesthesiologist  Anesthesia Plan Comments:  (Risks/benefits of general anesthesia discussed with patient including risk of damage to teeth, lips, gum, and tongue, nausea/vomiting, allergic reactions to medications, and the possibility of heart attack, stroke and death.  All patient questions answered.  Patient wishes to proceed.  Discussed risks and benefits of interscalene block including failure, bleeding, infection, nerve damage, weakness, shortness of breath, pneumothorax. Questions answered. Patient consents to block. )       Anesthesia Quick Evaluation

## 2015-07-12 NOTE — Anesthesia Postprocedure Evaluation (Signed)
Anesthesia Post Note  Patient: [name]  Procedure(s) Performed: Procedure(s) (LRB): SHOULDER ARTHROSCOPY WITH ACROMIOPLASTY AND OPEN ROTATOR CUFF REPAIR (Right)  Patient location during evaluation: PACU Anesthesia Type: General and Regional Level of consciousness: awake and alert Pain management: pain level controlled Vital Signs Assessment: post-procedure vital signs reviewed and stable Respiratory status: spontaneous breathing, nonlabored ventilation, respiratory function stable and patient connected to nasal cannula oxygen Cardiovascular status: blood pressure returned to baseline and stable Postop Assessment: no signs of nausea or vomiting Anesthetic complications: no    Last Vitals:  Filed Vitals:   07/12/15 1145 07/12/15 1147  BP: 113/51   Pulse: 77 77  Temp:    Resp: 18 19    Last Pain:  Filed Vitals:   07/12/15 1148  PainSc: 0-No pain                 Catalina Gravel

## 2015-07-12 NOTE — Brief Op Note (Signed)
07/12/2015  10:55 AM  PATIENT:  Sherrill Raring  55 y.o. female  PRE-OPERATIVE DIAGNOSIS:  RIGHT SHOULDER IMPINGEMENT WITH POSSIBLE ROTATOR CUFF TEAR  POST-OPERATIVE DIAGNOSIS:  RIGHT SHOULDER IMPINGEMENT ROTATOR CUFF TEAR RIGHT  PROCEDURE:  Procedure(s): SHOULDER ARTHROSCOPY WITH ACROMIOPLASTY AND OPEN ROTATOR CUFF REPAIR (Right)  SURGEON:  Surgeon(s) and Role:    * Dorna Leitz, MD - Primary  PHYSICIAN ASSISTANT:   ASSISTANTS: bethune   ANESTHESIA:   general  EBL:  Total I/O In: 1000 [I.V.:1000] Out: -   BLOOD ADMINISTERED:none  DRAINS: none   LOCAL MEDICATIONS USED:  MARCAINE     SPECIMEN:  No Specimen  DISPOSITION OF SPECIMEN:  N/A  COUNTS:  YES  TOURNIQUET:  * No tourniquets in log *  DICTATION: .Other Dictation: Dictation Number [id number]  PLAN OF CARE: Discharge to home after PACU  PATIENT DISPOSITION:  PACU - hemodynamically stable.   Delay start of Pharmacological VTE agent (>24hrs) due to surgical blood loss or risk of bleeding: no

## 2015-07-12 NOTE — Transfer of Care (Signed)
Immediate Anesthesia Transfer of Care Note  Patient: [name]  Procedure(s) Performed: Procedure(s): SHOULDER ARTHROSCOPY WITH ACROMIOPLASTY AND OPEN ROTATOR CUFF REPAIR (Right)  Patient Location: PACU  Anesthesia Type:GA combined with regional for post-op pain  Level of Consciousness: awake and patient cooperative  Airway & Oxygen Therapy: Patient Spontanous Breathing and Patient connected to face mask oxygen  Post-op Assessment: Report given to RN and Post -op Vital signs reviewed and stable  Post vital signs: Reviewed and stable  Last Vitals:  Filed Vitals:   07/12/15 0928 07/12/15 0929  BP:    Pulse: 65 64  Temp:    Resp: 17 24    Complications: No apparent anesthesia complications

## 2015-07-12 NOTE — H&P (Signed)
PREOPERATIVE H&P  Chief Complaint: r shoulder pain s/p previous shoulder scope  HPI: [name] is a 55 y.o. female who presents for evaluation of r shoulder pain s/p previous shoulder scope and bicepts tenotomy. It has been present for  Greater than 3 months and has been worsening. She has failed conservative measures. Pain is rated as severe.  Past Medical History  Diagnosis Date  . Fibromyalgia   . Hyperlipidemia   . COPD (chronic obstructive pulmonary disease) ([place])   . Prediabetes   . Restless leg syndrome   . Hypertension     normal now, only took medications for 1 month- thinks it was from pain.  Marland Kitchen Hypothyroidism   . Hemorrhoid   . Anxiety   . Depression   . ADD (attention deficit disorder)   . Bladder neoplasm   . History of melanoma excision     leg  . Iron deficiency anemia   . Cancer [facility]) 2015    Bladder cancer  . Shortness of breath dyspnea     with exertion  . H/O bronchitis     treated within the last week w steroids, antibx tessalon pearls.   . SUI (stress urinary incontinence, female)   . Fibroids   . Wears glasses   . H/O sebaceous cyst 07/18/14    left breast   . Shoulder impingement     right  . PONV (postoperative nausea and vomiting)     used a scop patch last surg-did well   Past Surgical History  Procedure Laterality Date  . Ankle fusion Right 2011  . Knee arthroscopy with medial menisectomy Right 05/22/2012    Procedure: KNEE ARTHROSCOPY WITH MEDIAL MENISECTOMY and Medial Plica excision;  Surgeon: Alta Corning, MD;  Location: [place];  Service: Orthopedics;  Laterality: Right;  . Anterior cervical decomp/discectomy fusion N/A 09/01/2013    Procedure: Anterior cervical decompression fusion, cervical 5-6 with instrumentation and allograft;  Surgeon: Sinclair Ship, MD;  Location: [place];  Service: Orthopedics;  Laterality: N/A;  Anterior cervical decompression fusion, cervical 5-6 with instrumentation and allograft  .  Shoulder arthroscopy with subacromial decompression Left 11/10/2013    Procedure: SHOULDER ARTHROSCOPY WITH SUBACROMIAL DECOMPRESSION WITH ROTATOR CUFF REPAIR AND BICEPS TENODESIS;  Surgeon: Alta Corning, MD;  Location: [place];  Service: Orthopedics;  Laterality: Left;  . Closed reduction and pinning left fifth  proximal phalangeal fx  10-09-2005  . D & c hysteroscopy with novasure endometrial ablation  07-16-2006  . Shoulder arthroscopy w/ subacromial decompression and distal clavicle excision Left 11-27-2011    W/  DEBRIDEMENT  . Cataract extraction w/ intraocular lens implant Bilateral   . Tonsillectomy  age 72  . Laparoscopic cholecystectomy  2005  . Tubal ligation  1980  . Transurethral resection of bladder tumor N/A 01/11/2014    Procedure: TRANSURETHRAL RESECTION OF BLADDER TUMOR (TURBT);  Surgeon: Festus Aloe, MD;  Location: [facility];  Service: Urology;  Laterality: N/A;  . Cystoscopy w/ retrogrades Left 01/11/2014    Procedure: CYSTOSCOPY WITH RETROGRADE PYELOGRAM;  Surgeon: Festus Aloe, MD;  Location: [facility];  Service: Urology;  Laterality: Left;  . Cystoscopy with stent placement Left 01/11/2014    Procedure: CYSTOSCOPY WITH STENT PLACEMENT;  Surgeon: Festus Aloe, MD;  Location: [facility];  Service: Urology;  Laterality: Left;  . Laparoscopic assisted vaginal hysterectomy N/A 08/29/2014    Procedure: LAPAROSCOPIC ASSISTED VAGINAL HYSTERECTOMY;  Surgeon: Molli Posey, MD;  Location: [place];  Service: Gynecology;  Laterality: N/A;  . Salpingoophorectomy Bilateral 08/29/2014    Procedure: SALPINGO OOPHORECTOMY;  Surgeon: Molli Posey, MD;  Location: [facility];  Service: Gynecology;  Laterality: Bilateral;  . Pubovaginal sling N/A 08/29/2014    Procedure: Okey Dupre SINGLE INCISION SLING;  Surgeon: Molli Posey, MD;  Location: [facility];  Service:  Gynecology;  Laterality: N/A;  . Laparotomy N/A 08/29/2014    Procedure: EXPLORATORY LAPAROTOMY WITH LIGATION OF BLEEDER;  Surgeon: Molli Posey, MD;  Location: [facility];  Service: Gynecology;  Laterality: N/A;  . Shoulder arthroscopy with subacromial decompression Right 12/21/2014    Procedure: SHOULDER ARTHROSCOPY WITH SUBACROMIAL DECOMPRESSION;  Surgeon: Dorna Leitz, MD;  Location: [place];  Service: Orthopedics;  Laterality: Right;  . Shoulder arthroscopy with distal clavicle resection Right 12/21/2014    Procedure: SHOULDER ARTHROSCOPY WITH DISTAL CLAVICLE RESECTION;  Surgeon: Dorna Leitz, MD;  Location: [place];  Service: Orthopedics;  Laterality: Right;  . Shoulder arthroscopy with bicepstenotomy Right 12/21/2014    Procedure: SHOULDER ARTHROSCOPY WITH BICEPSTENOTOMY;  Surgeon: Dorna Leitz, MD;  Location: [place];  Service: Orthopedics;  Laterality: Right;  . Abdominal hysterectomy    . Posterior cervical fusion/foraminotomy N/A 01/25/2015    Procedure: POSTERIOR CERVICAL DECOMPRESSSION CERVICAL 7-THORACIC 1;  Surgeon: Phylliss Bob, MD;  Location: [place];  Service: Orthopedics;  Laterality: N/A;  Posterior cervical decompression, cervical 5-6, cervical 6-7, cervical 7-thoracic 1    Social History   Social History  . Marital Status: Married    Spouse Name: N/A  . Number of Children: 2  . Years of Education: N/A   Occupational History  . logistic specialist    Social History Main Topics  . Smoking status: Former Smoker -- 1.00 packs/day for 30 years    Types: Cigarettes    Quit date: 06/24/2009  . Smokeless tobacco: Never Used  . Alcohol Use: No  . Drug Use: No  . Sexual Activity: Not Asked   Other Topics Concern  . None   Social History Narrative   Lives with husband in a one story home.  Has 2 children.     Not currently working.  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   Trying to get disability.      Education: 10th grade.   Family History  Problem Relation Age of Onset  . Cancer Father     Lung and stomach   . Heart disease Father   . Hypertension Father   . Stroke Father   . Heart failure Paternal Grandfather   . Cancer Paternal Grandfather     colon cancer  . Mental retardation Son   . Ovarian cancer Mother 29  . Breast cancer Mother     dx in her late 4s-50s  . Thyroid cancer Mother   . Ovarian cancer Sister 38  . Cancer Brother     abdominal tumor  . Ovarian cancer Maternal Aunt   . Breast cancer Maternal Aunt   . Breast cancer Maternal Uncle   . Breast cancer Paternal Aunt   . Ovarian cancer Sister 4  . Ovarian cancer Sister 40  . Cancer Brother     abdominal tumor  . Breast cancer Paternal Aunt   . Breast cancer Paternal Aunt   . Breast cancer Paternal Aunt   . Breast cancer Cousin   . Breast cancer Cousin    Allergies  Allergen Reactions  .  Sulfa Antibiotics Hives and Itching   Prior to Admission medications   Medication Sig Start Date End Date Taking? Authorizing Provider  albuterol (VENTOLIN HFA) 108 (90 BASE) MCG/ACT inhaler Inhale 2 puffs into the lungs every 4 (four) hours as needed for wheezing or shortness of breath. 01/13/15  Yes Vicie Mutters, PA-C  amphetamine-dextroamphetamine (ADDERALL) 20 MG tablet Take 1/2 to 1 tablet 1 or 2 x day only if needed for ADD 06/27/15  Yes Vicie Mutters, PA-C  cholecalciferol (VITAMIN D) 1000 UNITS tablet Take 7,000 Units by mouth at bedtime.    Yes Historical Provider, MD  clonazePAM (KLONOPIN) 2 MG tablet TAKE ONE TABLET BY MOUTH TWICE DAILY 06/14/15 08/14/15 Yes Unk Pinto, MD  escitalopram (LEXAPRO) 20 MG tablet Take 1 tablet (20 mg total) by mouth daily. 01/13/15 01/13/16 Yes Vicie Mutters, PA-C  furosemide (LASIX) 20 MG tablet Take 20-40 mg by mouth daily as needed for fluid.   Yes Historical Provider, MD  gabapentin (NEURONTIN) 600 MG tablet Take 1/2 to 1 tablet 3 to 4 x day for neuritis pains 05/10/15 11/07/15  Yes Unk Pinto, MD  levothyroxine (SYNTHROID, LEVOTHROID) 75 MCG tablet Take 1 tablet (75 mcg total) by mouth daily before breakfast. 01/13/15  Yes Vicie Mutters, PA-C  MAGNESIUM PO Take 450 mg by mouth daily.   Yes Historical Provider, MD  predniSONE (DELTASONE) 1 MG tablet Take 1 mg by mouth daily with breakfast.   Yes Historical Provider, MD     Positive ROS: none  All other systems have been reviewed and were otherwise negative with the exception of those mentioned in the HPI and as above.  Physical Exam: Filed Vitals:   07/12/15 0902 07/12/15 0903  BP:    Pulse: 67 57  Temp:    Resp: 19 16    General: Alert, no acute distress Cardiovascular: No pedal edema Respiratory: No cyanosis, no use of accessory musculature GI: No organomegaly, abdomen is soft and non-tender Skin: No lesions in the area of chief complaint Neurologic: Sensation intact distally Psychiatric: Patient is competent for consent with normal mood and affect Lymphatic: No axillary or cervical lymphadenopathy  MUSCULOSKELETAL: r shoulder: painful rom no instability TTP over lateral aspect  Assessment/Plan: RIGHT SHOULDER IMPINGEMENT s/p previous shoulder scope with bicepts tenotomy and partial thickness rc debridement with ongoing pain Plan for Procedure(s): ARTHROSCOPY SHOULDER likely RC repair  The risks benefits and alternatives were discussed with the patient including but not limited to the risks of nonoperative treatment, versus surgical intervention including infection, bleeding, nerve injury, malunion, nonunion, hardware prominence, hardware failure, need for hardware removal, blood clots, cardiopulmonary complications, morbidity, mortality, among others, and they were willing to proceed.  Predicted outcome is good, although there will be at least a six to nine month expected recovery.  Veto Macqueen L, MD 07/12/2015 9:16 AM

## 2015-07-12 NOTE — Progress Notes (Signed)
Assisted Dr. Turk with right, ultrasound guided, interscalene  block. Side rails up, monitors on throughout procedure. See vital signs in flow sheet. Tolerated Procedure well. 

## 2015-07-13 ENCOUNTER — Encounter (HOSPITAL_BASED_OUTPATIENT_CLINIC_OR_DEPARTMENT_OTHER): Payer: Self-pay | Admitting: Orthopedic Surgery

## 2015-07-13 NOTE — Op Note (Signed)
NAME:  [name], [name]                  ACCOUNT NO.:  MEDICAL RECORD NO.:  [id number]  LOCATION:                                 FACILITY:  PHYSICIAN:  Alta Corning, M.D.   DATE OF BIRTH:  09/03/1960  DATE OF PROCEDURE:  07/12/2015 DATE OF DISCHARGE:                              OPERATIVE REPORT   PREOPERATIVE DIAGNOSIS:  Status post subacromial decompression and biceps tenotomy with high-grade partial-thickness rotator cuff tear, treated conservatively.  POSTOPERATIVE DIAGNOSIS:  Near-complete rotator cuff tear of the supraspinatus with impingement and some overhang of the distal clavicle.  PROCEDURES: 1. Mini open rotator cuff repair and acutely torn rotator cuff. 2. Arthroscopic subacromial decompression from lateral and posterior     compartments. 3. Arthroscopic distal clavicle by way of debridement of the superior     portion of the distal clavicle.  SURGEON:  Alta Corning, M.D.  ASSIST:  Gary Fleet, P.A.  ANESTHESIA:  General.  BRIEF HISTORY:  [name] is a 55 year old female with a long history of significant complaints of right shoulder pain.  She had been treated conservatively for a period of time and had previous arthroscopic intervention, which showed that she had a frayed and near completely torn biceps tendon.  We did a biceps tenotomy and had released that. She had done well with that initially, but then began having increasing pain.  There was some concern about continued pain in the shoulder area.  She had neck issues and she was evaluated by her spine surgeon when she felt that the pain was not coming from there.  We felt she was inconsistent about the pain in her shoulder.  She had some relief with subacromial injection.  We got an an arthrogram, which showed no complete full-thickness rotator cuff tear.  Because of continued complaints of pain and having seen a high-grade partial-thickness tear, we felt that it made sense to go back and  debride this and see if we could not give her some pain relief.  She was taken to the operating room for this procedure.  DESCRIPTION OF PROCEDURE:  The patient was taken to the operating room. After adequate anesthesia was obtained with general anesthetic, the patient was placed supine on the operating room table.  The right arm was prepped and draped in usual sterile fashion.  Following this, an arthroscopic examination revealed that there was no significant labral fray.  Glenohumeral joint looked pristine.  The rotator cuff on the undersurface did appear to have a high-grade partial tear.  We took the shaver to it and it really did feel like this was a very high-grade partial thickness tear.  At that point, we felt that open fixation would make sense.  So, a good look to the rest of the cuff looked quite good.  At this point, we went out of the glenohumeral joint into the subacromial space and anterolateral acromioplasty touching up the previous acromioplasty.  This provided a little bit of additional room. We then did the superior portion of the distal clavicle and did an ArthroCare burn of the distal clavicle to try to prevent regrowth.  Once this was done, we abandoned  the arthroscopic portion of the case, made a small incision laterally, went to the deltoid.  Did a thorough bursectomy.  There was some fairly tenacious bursa in this area both anteriorly and posteriorly.  We debrided this out thoroughly.  We then went to the area of the biceps tendon, which we could feel the bicipital groove and just posterior to that where we had done this significant debridement on the undersurface and with a single use of the rongeur, we basically created a rent in the cuff.  We just debrided it anteriorly and posteriorly back to where we knew we had excellent cuff tissue.  It still looked amenable only to a single FiberTape.  We put a single FiberTape in, pulled it to a single 4.5 lateral  SwiveLock and this gave really a very nice repair of the rotator cuff and compressed right back down to a bleeding bone, which had been established with the rongeur.  Put the arm through a range of motion. Full range of motion was easily achievable.  At this point, we irrigated the shoulder thoroughly, closed the deltoid with 1 Vicryl running, closed the skin with 0 and 2-0 Vicryl and 3-0 Monocryl subcuticular. Benzoin and Steri-Strips were applied.  Sterile compressive dressing was applied.  The patient was taken to the recovery room and was noted to be in satisfactory condition.  Estimated blood loss for the procedure was minimal.     Alta Corning, M.D.     Corliss Skains  D:  07/12/2015  T:  07/13/2015  Job:  [id number]

## 2015-07-24 ENCOUNTER — Other Ambulatory Visit: Payer: Self-pay | Admitting: Physician Assistant

## 2015-07-24 ENCOUNTER — Other Ambulatory Visit: Payer: Self-pay | Admitting: Internal Medicine

## 2015-07-24 DIAGNOSIS — M797 Fibromyalgia: Secondary | ICD-10-CM

## 2015-07-24 DIAGNOSIS — F988 Other specified behavioral and emotional disorders with onset usually occurring in childhood and adolescence: Secondary | ICD-10-CM

## 2015-07-24 MED ORDER — AMPHETAMINE-DEXTROAMPHETAMINE 20 MG PO TABS: ORAL_TABLET | ORAL | Status: DC

## 2015-08-01 NOTE — Progress Notes (Signed)
I was unable to reach patient by phone.  I left  A message on voice mail.  I instructed the patient to arrive at [facility] entrance at 0630  , nothing to eat or drink after midnight.   I instructed the patient to take the following medications in the am with just enough water to get them down:  Synthyroid, Klonopin, Lexapro; if needed Albutwerol and Norco if you tolerate it on an empty stomachI asked patient to not wear any lotions, powders, cologne, jewelry, piercing, make-up or nail polish.  I asked the patient to call [phone], in the am if there were any questions or problems.

## 2015-08-01 NOTE — Anesthesia Preprocedure Evaluation (Addendum)
Anesthesia Evaluation  Patient identified by MRN, date of birth, ID band Patient awake    Reviewed: Allergy & Precautions, NPO status , Patient's Chart, lab work & pertinent test results  History of Anesthesia Complications (+) PONV and history of anesthetic complications  Airway Mallampati: II  TM Distance: >3 FB Neck ROM: Full    Dental  (+) Teeth Intact, Dental Advisory Given, Caps,  All upper front capped:   Pulmonary shortness of breath and with exertion, COPD,  COPD inhaler, former smoker,    Pulmonary exam normal breath sounds clear to auscultation       Cardiovascular hypertension, + Peripheral Vascular Disease  Normal cardiovascular exam Rhythm:Regular Rate:Normal     Neuro/Psych PSYCHIATRIC DISORDERS Anxiety Depression Bipolar Disorder Peripheral neuropathy  Neuromuscular disease (RUE numbness, tingling s/p ACDF)    GI/Hepatic Neg liver ROS, GERD  ,  Endo/Other  Hypothyroidism Obesity   Renal/GU negative Renal ROS     Musculoskeletal  (+) Fibromyalgia -  Abdominal   Peds  Hematology  (+) Blood dyscrasia, anemia ,   Anesthesia Other Findings Day of surgery medications reviewed with the patient.  Reproductive/Obstetrics                            Anesthesia Physical Anesthesia Plan  ASA: III  Anesthesia Plan: General   Post-op Pain Management:    Induction: Intravenous  Airway Management Planned: Oral ETT  Additional Equipment:   Intra-op Plan:   Post-operative Plan: Extubation in OR  Informed Consent:   Plan Discussed with:   Anesthesia Plan Comments:         Anesthesia Quick Evaluation

## 2015-08-01 NOTE — H&P (Signed)
PREOPERATIVE H&P  Chief Complaint: R arm pain  HPI: [name] is a 55 y.o. female who presents with ongoing pain in the right arm. Involves the medial arm and forearm does go into her ulnar hand.  She states that more recently, the pain is been extending into her radial fingers as well.  MRI reveals a minimal right C7/T1 protrusion. The patient has had a previous C5-6 ACDF procedure, and did well from that procedure.  She also previously had a C7-T1 decompression posteriorly, and did well from that as well.  Patient has failed multiple forms of conservative care and continues to have pain (see office notes for additional details regarding the patient's full course of treatment)  Past Medical History  Diagnosis Date  . Fibromyalgia   . Hyperlipidemia   . COPD (chronic obstructive pulmonary disease) ([place])   . Prediabetes   . Restless leg syndrome   . Hypertension     normal now, only took medications for 1 month- thinks it was from pain.  Marland Kitchen Hypothyroidism   . Hemorrhoid   . Anxiety   . Depression   . ADD (attention deficit disorder)   . Bladder neoplasm   . History of melanoma excision     leg  . Iron deficiency anemia   . Cancer [facility]) 2015    Bladder cancer  . Shortness of breath dyspnea     with exertion  . H/O bronchitis     treated within the last week w steroids, antibx tessalon pearls.   . SUI (stress urinary incontinence, female)   . Fibroids   . Wears glasses   . H/O sebaceous cyst 07/18/14    left breast   . Shoulder impingement     right  . PONV (postoperative nausea and vomiting)     used a scop patch last surg-did well   Past Surgical History  Procedure Laterality Date  . Ankle fusion Right 2011  . Knee arthroscopy with medial menisectomy Right 05/22/2012    Procedure: KNEE ARTHROSCOPY WITH MEDIAL MENISECTOMY and Medial Plica excision;  Surgeon: Alta Corning, MD;  Location: [place];  Service: Orthopedics;  Laterality: Right;    . Anterior cervical decomp/discectomy fusion N/A 09/01/2013    Procedure: Anterior cervical decompression fusion, cervical 5-6 with instrumentation and allograft;  Surgeon: Sinclair Ship, MD;  Location: [place];  Service: Orthopedics;  Laterality: N/A;  Anterior cervical decompression fusion, cervical 5-6 with instrumentation and allograft  . Shoulder arthroscopy with subacromial decompression Left 11/10/2013    Procedure: SHOULDER ARTHROSCOPY WITH SUBACROMIAL DECOMPRESSION WITH ROTATOR CUFF REPAIR AND BICEPS TENODESIS;  Surgeon: Alta Corning, MD;  Location: [place];  Service: Orthopedics;  Laterality: Left;  . Closed reduction and pinning left fifth  proximal phalangeal fx  10-09-2005  . D & c hysteroscopy with novasure endometrial ablation  07-16-2006  . Shoulder arthroscopy w/ subacromial decompression and distal clavicle excision Left 11-27-2011    W/  DEBRIDEMENT  . Cataract extraction w/ intraocular lens implant Bilateral   . Tonsillectomy  age 33  . Laparoscopic cholecystectomy  2005  . Tubal ligation  1980  . Transurethral resection of bladder tumor N/A 01/11/2014    Procedure: TRANSURETHRAL RESECTION OF BLADDER TUMOR (TURBT);  Surgeon: Festus Aloe, MD;  Location: [facility]  Service: Urology;  Laterality: N/A;  . Cystoscopy w/ retrogrades Left 01/11/2014    Procedure: CYSTOSCOPY WITH RETROGRADE PYELOGRAM;  Surgeon: Festus Aloe, MD;  Location:  [place];  Service: Urology;  Laterality: Left;  . Cystoscopy with stent placement Left 01/11/2014    Procedure: CYSTOSCOPY WITH STENT PLACEMENT;  Surgeon: Festus Aloe, MD;  Location: [facility];  Service: Urology;  Laterality: Left;  . Laparoscopic assisted vaginal hysterectomy N/A 08/29/2014    Procedure: LAPAROSCOPIC ASSISTED VAGINAL HYSTERECTOMY;  Surgeon: Molli Posey, MD;  Location[facility];  Service: Gynecology;  Laterality: N/A;  .  Salpingoophorectomy Bilateral 08/29/2014    Procedure: SALPINGO OOPHORECTOMY;  Surgeon: Molli Posey, MD;  Location: [facility];  Service: Gynecology;  Laterality: Bilateral;  . Pubovaginal sling N/A 08/29/2014    Procedure: Okey Dupre SINGLE INCISION SLING;  Surgeon: Molli Posey, MD;  Location: [facility];  Service: Gynecology;  Laterality: N/A;  . Laparotomy N/A 08/29/2014    Procedure: EXPLORATORY LAPAROTOMY WITH LIGATION OF BLEEDER;  Surgeon: Molli Posey, MD;  Location: [facility];  Service: Gynecology;  Laterality: N/A;  . Shoulder arthroscopy with subacromial decompression Right 12/21/2014    Procedure: SHOULDER ARTHROSCOPY WITH SUBACROMIAL DECOMPRESSION;  Surgeon: Dorna Leitz, MD;  Location: [place];  Service: Orthopedics;  Laterality: Right;  . Shoulder arthroscopy with distal clavicle resection Right 12/21/2014    Procedure: SHOULDER ARTHROSCOPY WITH DISTAL CLAVICLE RESECTION;  Surgeon: Dorna Leitz, MD;  Location: [place];  Service: Orthopedics;  Laterality: Right;  . Shoulder arthroscopy with bicepstenotomy Right 12/21/2014    Procedure: SHOULDER ARTHROSCOPY WITH BICEPSTENOTOMY;  Surgeon: Dorna Leitz, MD;  Location: [place];  Service: Orthopedics;  Laterality: Right;  . Abdominal hysterectomy    . Posterior cervical fusion/foraminotomy N/A 01/25/2015    Procedure: POSTERIOR CERVICAL DECOMPRESSSION CERVICAL 7-THORACIC 1;  Surgeon: Phylliss Bob, MD;  Location: [place];  Service: Orthopedics;  Laterality: N/A;  Posterior cervical decompression, cervical 5-6, cervical 6-7, cervical 7-thoracic 1   . Shoulder arthroscopy with open rotator cuff repair Right 07/12/2015    Procedure: SHOULDER ARTHROSCOPY WITH ACROMIOPLASTY AND OPEN ROTATOR CUFF REPAIR;  Surgeon: Dorna Leitz, MD;  Location: [place];  Service: Orthopedics;  Laterality: Right;   Social History   Social History  . Marital Status:  Married    Spouse Name: N/A  . Number of Children: 2  . Years of Education: N/A   Occupational History  . logistic specialist    Social History Main Topics  . Smoking status: Former Smoker -- 1.00 packs/day for 30 years    Types: Cigarettes    Quit date: 06/24/2009  . Smokeless tobacco: Never Used  . Alcohol Use: No  . Drug Use: No  . Sexual Activity: Not on file   Other Topics Concern  . Not on file   Social History Narrative   Lives with husband in a one story home.  Has 2 children.     Not currently working.  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   Trying to get disability.     Education: 10th grade.   Family History  Problem Relation Age of Onset  . Cancer Father     Lung and stomach   . Heart disease Father   . Hypertension Father   . Stroke Father   . Heart failure Paternal Grandfather   . Cancer Paternal Grandfather     colon cancer  . Mental retardation Son   . Ovarian cancer Mother 29  . Breast cancer Mother     dx in her late 77s-50s  . Thyroid  cancer Mother   . Ovarian cancer Sister 72  . Cancer Brother     abdominal tumor  . Ovarian cancer Maternal Aunt   . Breast cancer Maternal Aunt   . Breast cancer Maternal Uncle   . Breast cancer Paternal Aunt   . Ovarian cancer Sister 55  . Ovarian cancer Sister 54  . Cancer Brother     abdominal tumor  . Breast cancer Paternal Aunt   . Breast cancer Paternal Aunt   . Breast cancer Paternal Aunt   . Breast cancer Cousin   . Breast cancer Cousin    Allergies  Allergen Reactions  . Sulfa Antibiotics Hives and Itching   Prior to Admission medications   Medication Sig Start Date End Date Taking? Authorizing Provider  albuterol (VENTOLIN HFA) 108 (90 BASE) MCG/ACT inhaler Inhale 2 puffs into the lungs every 4 (four) hours as needed for wheezing or shortness of breath. 01/13/15  Yes Vicie Mutters, PA-C  amphetamine-dextroamphetamine (ADDERALL) 20 MG tablet Take 1/2 to 1 tablet 1 or 2 x  day only if needed for ADD Patient taking differently: Take 10-20 mg by mouth 2 (two) times daily as needed (ADD).  07/24/15  Yes Unk Pinto, MD  cholecalciferol (VITAMIN D) 1000 UNITS tablet Take 7,000 Units by mouth at bedtime.    Yes Historical Provider, MD  clonazePAM (KLONOPIN) 2 MG tablet TAKE ONE TABLET BY MOUTH TWICE DAILY 06/14/15 08/14/15 Yes Unk Pinto, MD  escitalopram (LEXAPRO) 20 MG tablet Take 1 tablet (20 mg total) by mouth daily. 01/13/15 01/13/16 Yes Vicie Mutters, PA-C  furosemide (LASIX) 20 MG tablet Take 20-40 mg by mouth daily as needed for fluid.   Yes Historical Provider, MD  gabapentin (NEURONTIN) 600 MG tablet Take 1/2 to 1 tablet 3 to 4 x day for neuritis pains Patient taking differently: Take 300-600 mg by mouth 4 (four) times daily as needed (neuritis pains).  05/10/15 11/07/15 Yes Unk Pinto, MD  HYDROcodone-acetaminophen [facility]) 10-325 MG tablet Take 0.5 tablets by mouth every 6 (six) hours as needed for moderate pain.   Yes Historical Provider, MD  levothyroxine (SYNTHROID, LEVOTHROID) 75 MCG tablet Take 1 tablet (75 mcg total) by mouth daily before breakfast. 01/13/15  Yes Vicie Mutters, PA-C  MAGNESIUM PO Take 450 mg by mouth daily.   Yes Historical Provider, MD  predniSONE (DELTASONE) 1 MG tablet Take 1 mg by mouth daily with breakfast.   Yes Historical Provider, MD  tiZANidine (ZANAFLEX) 2 MG tablet Take 1 tablet (2 mg total) by mouth every 8 (eight) hours as needed for muscle spasms. 07/12/15  Yes Gary Fleet, PA-C     All other systems have been reviewed and were otherwise negative with the exception of those mentioned in the HPI and as above.  Physical Exam: There were no vitals filed for this visit.  General: Alert, no acute distress Cardiovascular: No pedal edema Respiratory: No cyanosis, no use of accessory musculature Skin: No lesions in the area of chief complaint Neurologic: Sensation intact distally Psychiatric: Patient is competent for  consent with normal mood and affect Lymphatic: No axillary or cervical lymphadenopathy  MUSCULOSKELETAL: markedly positive Spurling sign on the right  Assessment/Plan: Right arm pain, possibly related to right C8 radiculopathy Plan for Procedure(s): ANTERIOR CERVICAL DECOMPRESSION FUSION, CERVICAL 6-7,CERVICAL 7-THORACIC 7 WITH INSTRUMENTATION AND ALLOGRAFT   Sinclair Ship, MD 08/01/2015 2:57 PM

## 2015-08-02 ENCOUNTER — Ambulatory Visit (HOSPITAL_COMMUNITY): Payer: BLUE CROSS/BLUE SHIELD | Admitting: Anesthesiology

## 2015-08-02 ENCOUNTER — Encounter (HOSPITAL_COMMUNITY)
Admission: RE | Disposition: A | Discharge status: Home / Self Care | Payer: Self-pay | Source: Ambulatory Visit | Attending: Orthopedic Surgery

## 2015-08-02 ENCOUNTER — Encounter (HOSPITAL_COMMUNITY): Payer: Self-pay | Admitting: Certified Registered Nurse Anesthetist

## 2015-08-02 ENCOUNTER — Ambulatory Visit (HOSPITAL_COMMUNITY): Payer: BLUE CROSS/BLUE SHIELD

## 2015-08-02 ENCOUNTER — Observation Stay (HOSPITAL_COMMUNITY)
Admission: RE | Admit: 2015-08-02 | Discharge: 2015-08-03 | Disposition: A | Discharge status: Home / Self Care | Payer: BLUE CROSS/BLUE SHIELD | Source: Ambulatory Visit | Attending: Orthopedic Surgery | Admitting: Orthopedic Surgery

## 2015-08-02 DIAGNOSIS — J449 Chronic obstructive pulmonary disease, unspecified: Secondary | ICD-10-CM | POA: Diagnosis present

## 2015-08-02 DIAGNOSIS — Z79899 Other long term (current) drug therapy: Secondary | ICD-10-CM | POA: Insufficient documentation

## 2015-08-02 DIAGNOSIS — Z7952 Long term (current) use of systemic steroids: Secondary | ICD-10-CM | POA: Insufficient documentation

## 2015-08-02 DIAGNOSIS — M5412 Radiculopathy, cervical region: Secondary | ICD-10-CM | POA: Insufficient documentation

## 2015-08-02 DIAGNOSIS — Z8551 Personal history of malignant neoplasm of bladder: Secondary | ICD-10-CM | POA: Insufficient documentation

## 2015-08-02 DIAGNOSIS — M4802 Spinal stenosis, cervical region: Secondary | ICD-10-CM | POA: Diagnosis not present

## 2015-08-02 DIAGNOSIS — F329 Major depressive disorder, single episode, unspecified: Secondary | ICD-10-CM | POA: Insufficient documentation

## 2015-08-02 DIAGNOSIS — M4803 Spinal stenosis, cervicothoracic region: Secondary | ICD-10-CM | POA: Diagnosis not present

## 2015-08-02 DIAGNOSIS — R072 Precordial pain: Secondary | ICD-10-CM | POA: Insufficient documentation

## 2015-08-02 DIAGNOSIS — G629 Polyneuropathy, unspecified: Secondary | ICD-10-CM | POA: Diagnosis not present

## 2015-08-02 DIAGNOSIS — Z419 Encounter for procedure for purposes other than remedying health state, unspecified: Secondary | ICD-10-CM

## 2015-08-02 DIAGNOSIS — G2581 Restless legs syndrome: Secondary | ICD-10-CM | POA: Insufficient documentation

## 2015-08-02 DIAGNOSIS — Z981 Arthrodesis status: Secondary | ICD-10-CM | POA: Insufficient documentation

## 2015-08-02 DIAGNOSIS — M797 Fibromyalgia: Secondary | ICD-10-CM | POA: Diagnosis present

## 2015-08-02 DIAGNOSIS — M5410 Radiculopathy, site unspecified: Secondary | ICD-10-CM | POA: Diagnosis present

## 2015-08-02 DIAGNOSIS — F419 Anxiety disorder, unspecified: Secondary | ICD-10-CM | POA: Insufficient documentation

## 2015-08-02 DIAGNOSIS — F988 Other specified behavioral and emotional disorders with onset usually occurring in childhood and adolescence: Secondary | ICD-10-CM | POA: Insufficient documentation

## 2015-08-02 DIAGNOSIS — R52 Pain, unspecified: Secondary | ICD-10-CM

## 2015-08-02 DIAGNOSIS — R0602 Shortness of breath: Secondary | ICD-10-CM | POA: Insufficient documentation

## 2015-08-02 DIAGNOSIS — Z87891 Personal history of nicotine dependence: Secondary | ICD-10-CM | POA: Insufficient documentation

## 2015-08-02 DIAGNOSIS — E039 Hypothyroidism, unspecified: Secondary | ICD-10-CM | POA: Diagnosis not present

## 2015-08-02 DIAGNOSIS — E669 Obesity, unspecified: Secondary | ICD-10-CM | POA: Diagnosis present

## 2015-08-02 HISTORY — PX: ANTERIOR CERVICAL DECOMP/DISCECTOMY FUSION: SHX1161

## 2015-08-02 LAB — BASIC METABOLIC PANEL
Anion gap: 8 (ref 5–15)
BUN: 22 mg/dL — ABNORMAL HIGH (ref 6–20)
CO2: 26 mmol/L (ref 22–32)
Calcium: 9.5 mg/dL (ref 8.9–10.3)
Chloride: 105 mmol/L (ref 101–111)
Creatinine, Ser: 0.73 mg/dL (ref 0.44–1.00)
GFR calc Af Amer: >60 mL/min (ref >60)
GFR calc non Af Amer: >60 mL/min (ref >60)
Glucose, Bld: 104 mg/dL — ABNORMAL HIGH (ref 65–99)
Potassium: 4.1 mmol/L (ref 3.5–5.1)
Sodium: 139 mmol/L (ref 135–145)

## 2015-08-02 LAB — CBC
HCT: 42.9 % (ref 36.0–46.0)
Hemoglobin: 14.3 g/dL (ref 12.0–15.0)
MCH: 28.9 pg (ref 26.0–34.0)
MCHC: 33.3 g/dL (ref 30.0–36.0)
MCV: 86.7 fL (ref 78.0–100.0)
Platelets: 284 10*3/uL (ref 150–400)
RBC: 4.95 MIL/uL (ref 3.87–5.11)
RDW: 13 % (ref 11.5–15.5)
WBC: 8.6 10*3/uL (ref 4.0–10.5)

## 2015-08-02 LAB — SURGICAL PCR SCREEN
MRSA, PCR: NEGATIVE
Staphylococcus aureus: NEGATIVE

## 2015-08-02 SURGERY — ANTERIOR CERVICAL DECOMPRESSION/DISCECTOMY FUSION 2 LEVELS
Anesthesia: General | Site: Spine Cervical

## 2015-08-02 MED ORDER — ONDANSETRON HCL 4 MG/2ML IJ SOLN
INTRAMUSCULAR | Status: AC
  Filled 2015-08-02: qty 2

## 2015-08-02 MED ORDER — ACETAMINOPHEN 325 MG PO TABS: 650.0000 mg | ORAL_TABLET | ORAL | Status: DC | PRN

## 2015-08-02 MED ORDER — HYDROMORPHONE HCL 1 MG/ML IJ SOLN
INTRAMUSCULAR | Status: AC
  Filled 2015-08-02: qty 1

## 2015-08-02 MED ORDER — DOCUSATE SODIUM 100 MG PO CAPS
100.0000 mg | ORAL_CAPSULE | Freq: Two times a day (BID) | ORAL | Status: DC
  Administered 2015-08-02 – 2015-08-03 (×3): 100 mg via ORAL
  Filled 2015-08-02 (×3): qty 1

## 2015-08-02 MED ORDER — LIDOCAINE HCL (CARDIAC) 20 MG/ML IV SOLN
INTRAVENOUS | Status: DC | PRN
  Administered 2015-08-02: 50 mg via INTRATRACHEAL

## 2015-08-02 MED ORDER — CEFAZOLIN SODIUM 1 G IJ SOLR
INTRAMUSCULAR | Status: AC
  Filled 2015-08-02: qty 20

## 2015-08-02 MED ORDER — SUCCINYLCHOLINE CHLORIDE 20 MG/ML IJ SOLN
INTRAMUSCULAR | Status: DC | PRN
  Administered 2015-08-02: 160 mg via INTRAVENOUS

## 2015-08-02 MED ORDER — PHENYLEPHRINE HCL 10 MG/ML IJ SOLN
INTRAMUSCULAR | Status: DC | PRN
  Administered 2015-08-02 (×2): 80 ug via INTRAVENOUS
  Administered 2015-08-02: 120 ug via INTRAVENOUS
  Administered 2015-08-02: 40 ug via INTRAVENOUS

## 2015-08-02 MED ORDER — LIDOCAINE HCL 4 % EX SOLN
CUTANEOUS | Status: DC | PRN
  Administered 2015-08-02: 5 mL via TOPICAL

## 2015-08-02 MED ORDER — FENTANYL CITRATE (PF) 250 MCG/5ML IJ SOLN
INTRAMUSCULAR | Status: DC | PRN
  Administered 2015-08-02: 100 ug via INTRAVENOUS
  Administered 2015-08-02: 25 ug via INTRAVENOUS
  Administered 2015-08-02: 100 ug via INTRAVENOUS
  Administered 2015-08-02: 25 ug via INTRAVENOUS

## 2015-08-02 MED ORDER — LACTATED RINGERS IV SOLN
INTRAVENOUS | Status: DC | PRN
  Administered 2015-08-02: 08:00:00 via INTRAVENOUS

## 2015-08-02 MED ORDER — SENNOSIDES-DOCUSATE SODIUM 8.6-50 MG PO TABS: 1.0000 | ORAL_TABLET | Freq: Every evening | ORAL | Status: DC | PRN

## 2015-08-02 MED ORDER — HYDROMORPHONE HCL 1 MG/ML IJ SOLN
0.2500 mg | INTRAMUSCULAR | Status: DC | PRN
  Administered 2015-08-02 (×4): 0.5 mg via INTRAVENOUS

## 2015-08-02 MED ORDER — PHENOL 1.4 % MT LIQD
1.0000 | OROMUCOSAL | Status: DC | PRN
Start: 2015-08-02 — End: 2015-08-04
  Administered 2015-08-02: 1 via OROMUCOSAL
  Filled 2015-08-02: qty 177

## 2015-08-02 MED ORDER — MORPHINE SULFATE (PF) 2 MG/ML IV SOLN
1.0000 mg | INTRAVENOUS | Status: DC | PRN
  Administered 2015-08-02: 2 mg via INTRAVENOUS
  Filled 2015-08-02: qty 1

## 2015-08-02 MED ORDER — ARTIFICIAL TEARS OP OINT
TOPICAL_OINTMENT | OPHTHALMIC | Status: AC
  Filled 2015-08-02: qty 3.5

## 2015-08-02 MED ORDER — ALBUTEROL SULFATE HFA 108 (90 BASE) MCG/ACT IN AERS
INHALATION_SPRAY | RESPIRATORY_TRACT | Status: DC | PRN
  Administered 2015-08-02: 2 via RESPIRATORY_TRACT

## 2015-08-02 MED ORDER — THROMBIN 20000 UNITS EX SOLR
CUTANEOUS | Status: AC
  Filled 2015-08-02: qty 20000

## 2015-08-02 MED ORDER — SCOPOLAMINE 1 MG/3DAYS TD PT72: 1.0000 | MEDICATED_PATCH | TRANSDERMAL | Status: DC

## 2015-08-02 MED ORDER — BUPIVACAINE-EPINEPHRINE (PF) 0.25% -1:200000 IJ SOLN
INTRAMUSCULAR | Status: AC
  Filled 2015-08-02: qty 30

## 2015-08-02 MED ORDER — MUPIROCIN 2 % EX OINT
1.0000 "application " | TOPICAL_OINTMENT | Freq: Once | CUTANEOUS | Status: AC
  Administered 2015-08-02: 1 via TOPICAL

## 2015-08-02 MED ORDER — GLYCOPYRROLATE 0.2 MG/ML IJ SOLN
INTRAMUSCULAR | Status: AC
  Filled 2015-08-02: qty 1

## 2015-08-02 MED ORDER — ALUM & MAG HYDROXIDE-SIMETH 200-200-20 MG/5ML PO SUSP: 30.0000 mL | Freq: Four times a day (QID) | ORAL | Status: DC | PRN

## 2015-08-02 MED ORDER — GLYCOPYRROLATE 0.2 MG/ML IJ SOLN
INTRAMUSCULAR | Status: DC | PRN
  Administered 2015-08-02: 0.2 mg via INTRAVENOUS

## 2015-08-02 MED ORDER — ALBUTEROL SULFATE (2.5 MG/3ML) 0.083% IN NEBU: 2.5000 mg | INHALATION_SOLUTION | RESPIRATORY_TRACT | Status: DC | PRN

## 2015-08-02 MED ORDER — FENTANYL CITRATE (PF) 250 MCG/5ML IJ SOLN
INTRAMUSCULAR | Status: AC
  Filled 2015-08-02: qty 5

## 2015-08-02 MED ORDER — PHENYLEPHRINE HCL 10 MG/ML IJ SOLN
10.0000 mg | INTRAVENOUS | Status: DC | PRN
  Administered 2015-08-02 (×2): 50 ug/min via INTRAVENOUS

## 2015-08-02 MED ORDER — ROCURONIUM BROMIDE 100 MG/10ML IV SOLN
INTRAVENOUS | Status: DC | PRN
  Administered 2015-08-02: 25 mg via INTRAVENOUS

## 2015-08-02 MED ORDER — PROPOFOL 10 MG/ML IV BOLUS
INTRAVENOUS | Status: AC
  Filled 2015-08-02: qty 20

## 2015-08-02 MED ORDER — LIDOCAINE HCL (CARDIAC) 20 MG/ML IV SOLN
INTRAVENOUS | Status: AC
Start: 2015-08-02 — End: 2015-08-02
  Filled 2015-08-02: qty 5

## 2015-08-02 MED ORDER — DIAZEPAM 5 MG PO TABS
5.0000 mg | ORAL_TABLET | Freq: Four times a day (QID) | ORAL | Status: DC | PRN
  Administered 2015-08-02: 5 mg via ORAL
  Filled 2015-08-02: qty 1

## 2015-08-02 MED ORDER — VITAMIN D 1000 UNITS PO TABS
7000.0000 [IU] | ORAL_TABLET | Freq: Every day | ORAL | Status: DC
  Administered 2015-08-02: 7000 [IU] via ORAL
  Filled 2015-08-02: qty 7

## 2015-08-02 MED ORDER — THROMBIN 20000 UNITS EX KIT
PACK | CUTANEOUS | Status: DC | PRN
  Administered 2015-08-02: 09:00:00 via TOPICAL

## 2015-08-02 MED ORDER — CEFAZOLIN SODIUM 1 G IJ SOLR
INTRAMUSCULAR | Status: DC | PRN
  Administered 2015-08-02: 2 g via INTRAMUSCULAR

## 2015-08-02 MED ORDER — SUGAMMADEX SODIUM 200 MG/2ML IV SOLN
INTRAVENOUS | Status: DC | PRN
  Administered 2015-08-02: 200 mg via INTRAVENOUS

## 2015-08-02 MED ORDER — HYDROCODONE-ACETAMINOPHEN 5-325 MG PO TABS
1.0000 | ORAL_TABLET | ORAL | Status: DC | PRN
  Administered 2015-08-02 – 2015-08-03 (×5): 2 via ORAL
  Filled 2015-08-02 (×5): qty 2

## 2015-08-02 MED ORDER — ONDANSETRON HCL 4 MG/2ML IJ SOLN: 4.0000 mg | INTRAMUSCULAR | Status: DC | PRN

## 2015-08-02 MED ORDER — SUGAMMADEX SODIUM 200 MG/2ML IV SOLN
INTRAVENOUS | Status: AC
  Filled 2015-08-02: qty 2

## 2015-08-02 MED ORDER — SUCCINYLCHOLINE CHLORIDE 20 MG/ML IJ SOLN
INTRAMUSCULAR | Status: AC
  Filled 2015-08-02: qty 1

## 2015-08-02 MED ORDER — SODIUM CHLORIDE 0.9% FLUSH: 3.0000 mL | INTRAVENOUS | Status: DC | PRN

## 2015-08-02 MED ORDER — FLEET ENEMA 7-19 GM/118ML RE ENEM: 1.0000 | ENEMA | Freq: Once | RECTAL | Status: DC | PRN

## 2015-08-02 MED ORDER — SCOPOLAMINE 1 MG/3DAYS TD PT72
1.0000 | MEDICATED_PATCH | TRANSDERMAL | Status: DC
  Administered 2015-08-02: 1.5 mg via TRANSDERMAL
  Filled 2015-08-02: qty 1

## 2015-08-02 MED ORDER — PROPOFOL 500 MG/50ML IV EMUL
INTRAVENOUS | Status: DC | PRN
  Administered 2015-08-02: 25 ug/kg/min via INTRAVENOUS

## 2015-08-02 MED ORDER — FUROSEMIDE 20 MG PO TABS: 20.0000 mg | ORAL_TABLET | Freq: Every day | ORAL | Status: DC | PRN

## 2015-08-02 MED ORDER — PROPOFOL 10 MG/ML IV BOLUS
INTRAVENOUS | Status: DC | PRN
  Administered 2015-08-02: 160 mg via INTRAVENOUS

## 2015-08-02 MED ORDER — MENTHOL 3 MG MT LOZG: 1.0000 | LOZENGE | OROMUCOSAL | Status: DC | PRN

## 2015-08-02 MED ORDER — ONDANSETRON HCL 4 MG/2ML IJ SOLN
INTRAMUSCULAR | Status: DC | PRN
  Administered 2015-08-02: 4 mg via INTRAVENOUS

## 2015-08-02 MED ORDER — SODIUM CHLORIDE 0.9% FLUSH
3.0000 mL | Freq: Two times a day (BID) | INTRAVENOUS | Status: DC
  Administered 2015-08-02 (×2): 3 mL via INTRAVENOUS

## 2015-08-02 MED ORDER — ZOLPIDEM TARTRATE 5 MG PO TABS: 5.0000 mg | ORAL_TABLET | Freq: Every evening | ORAL | Status: DC | PRN

## 2015-08-02 MED ORDER — CLONAZEPAM 0.5 MG PO TABS
2.0000 mg | ORAL_TABLET | Freq: Two times a day (BID) | ORAL | Status: DC
  Administered 2015-08-02 – 2015-08-03 (×2): 2 mg via ORAL
  Filled 2015-08-02 (×2): qty 4

## 2015-08-02 MED ORDER — PROPOFOL 1000 MG/100ML IV EMUL
INTRAVENOUS | Status: AC
  Filled 2015-08-02: qty 100

## 2015-08-02 MED ORDER — MIDAZOLAM HCL 2 MG/2ML IJ SOLN
INTRAMUSCULAR | Status: DC | PRN
  Administered 2015-08-02 (×2): 1 mg via INTRAVENOUS

## 2015-08-02 MED ORDER — PHENYLEPHRINE 40 MCG/ML (10ML) SYRINGE FOR IV PUSH (FOR BLOOD PRESSURE SUPPORT)
PREFILLED_SYRINGE | INTRAVENOUS | Status: AC
  Filled 2015-08-02: qty 10

## 2015-08-02 MED ORDER — ARTIFICIAL TEARS OP OINT
TOPICAL_OINTMENT | OPHTHALMIC | Status: DC | PRN
  Administered 2015-08-02: 1 via OPHTHALMIC

## 2015-08-02 MED ORDER — CEFAZOLIN SODIUM 1-5 GM-% IV SOLN
1.0000 g | Freq: Three times a day (TID) | INTRAVENOUS | Status: AC
  Administered 2015-08-02 (×2): 1 g via INTRAVENOUS
  Filled 2015-08-02 (×2): qty 50

## 2015-08-02 MED ORDER — BISACODYL 5 MG PO TBEC: 5.0000 mg | DELAYED_RELEASE_TABLET | Freq: Every day | ORAL | Status: DC | PRN

## 2015-08-02 MED ORDER — LEVOTHYROXINE SODIUM 75 MCG PO TABS
75.0000 ug | ORAL_TABLET | Freq: Every day | ORAL | Status: DC
  Administered 2015-08-03: 75 ug via ORAL
  Filled 2015-08-02 (×2): qty 1

## 2015-08-02 MED ORDER — 0.9 % SODIUM CHLORIDE (POUR BTL) OPTIME
TOPICAL | Status: DC | PRN
  Administered 2015-08-02: 1000 mL

## 2015-08-02 MED ORDER — ESCITALOPRAM OXALATE 20 MG PO TABS
20.0000 mg | ORAL_TABLET | Freq: Every day | ORAL | Status: DC
  Administered 2015-08-03: 20 mg via ORAL
  Filled 2015-08-02 (×2): qty 1

## 2015-08-02 MED ORDER — MAGNESIUM OXIDE 400 (241.3 MG) MG PO TABS
400.0000 mg | ORAL_TABLET | Freq: Every day | ORAL | Status: DC
  Administered 2015-08-03: 400 mg via ORAL
  Filled 2015-08-02: qty 1

## 2015-08-02 MED ORDER — MIDAZOLAM HCL 2 MG/2ML IJ SOLN
INTRAMUSCULAR | Status: AC
  Filled 2015-08-02: qty 2

## 2015-08-02 MED ORDER — ROCURONIUM BROMIDE 50 MG/5ML IV SOLN
INTRAVENOUS | Status: AC
  Filled 2015-08-02: qty 1

## 2015-08-02 MED ORDER — MUPIROCIN 2 % EX OINT
TOPICAL_OINTMENT | CUTANEOUS | Status: AC
  Administered 2015-08-02: 1 via TOPICAL
  Filled 2015-08-02: qty 22

## 2015-08-02 MED ORDER — PREDNISONE 1 MG PO TABS
1.0000 mg | ORAL_TABLET | Freq: Every day | ORAL | Status: DC
  Administered 2015-08-03: 1 mg via ORAL
  Filled 2015-08-02 (×2): qty 1

## 2015-08-02 MED ORDER — AMPHETAMINE-DEXTROAMPHETAMINE 10 MG PO TABS: 10.0000 mg | ORAL_TABLET | Freq: Two times a day (BID) | ORAL | Status: DC | PRN

## 2015-08-02 MED ORDER — GABAPENTIN 600 MG PO TABS
300.0000 mg | ORAL_TABLET | Freq: Four times a day (QID) | ORAL | Status: DC | PRN
  Administered 2015-08-03: 300 mg via ORAL
  Filled 2015-08-02: qty 1

## 2015-08-02 MED ORDER — ACETAMINOPHEN 650 MG RE SUPP: 650.0000 mg | RECTAL | Status: DC | PRN

## 2015-08-02 MED ORDER — LIDOCAINE HCL (CARDIAC) 20 MG/ML IV SOLN
INTRAVENOUS | Status: AC
  Filled 2015-08-02: qty 5

## 2015-08-02 MED ORDER — BUPIVACAINE-EPINEPHRINE 0.25% -1:200000 IJ SOLN
INTRAMUSCULAR | Status: DC | PRN
  Administered 2015-08-02: 2 mL

## 2015-08-02 MED ORDER — LACTATED RINGERS IV SOLN
INTRAVENOUS | Status: DC
  Administered 2015-08-02: 12:00:00 via INTRAVENOUS

## 2015-08-02 SURGICAL SUPPLY — 75 items
APL SKNCLS STERI-STRIP NONHPOA (GAUZE/BANDAGES/DRESSINGS) ×1
BENZOIN TINCTURE PRP APPL 2/3 (GAUZE/BANDAGES/DRESSINGS) ×2 IMPLANT
BIT DRILL NEURO 2X3.1 SFT TUCH (MISCELLANEOUS) ×1 IMPLANT
BIT DRILL SRG 14X2.2XFLT CHK (BIT) IMPLANT
BIT DRL SRG 14X2.2XFLT CHK (BIT) ×1
BLADE SURG 15 STRL LF DISP TIS (BLADE) ×1 IMPLANT
BLADE SURG 15 STRL SS (BLADE) ×2
BLADE SURG ROTATE 9660 (MISCELLANEOUS) ×2 IMPLANT
BUR MATCHSTICK NEURO 3.0 LAGG (BURR) IMPLANT
CARTRIDGE OIL MAESTRO DRILL (MISCELLANEOUS) ×1 IMPLANT
COLLAR CERV LO CONTOUR FIRM DE (SOFTGOODS) IMPLANT
CORDS BIPOLAR (ELECTRODE) ×2 IMPLANT
COVER SURGICAL LIGHT HANDLE (MISCELLANEOUS) ×2 IMPLANT
CRADLE DONUT ADULT HEAD (MISCELLANEOUS) ×2 IMPLANT
DECANTER SPIKE VIAL GLASS SM (MISCELLANEOUS) ×1 IMPLANT
DIFFUSER DRILL AIR PNEUMATIC (MISCELLANEOUS) ×2 IMPLANT
DRAIN JACKSON RD 7FR 3/32 (WOUND CARE) IMPLANT
DRAPE C-ARM 42X72 X-RAY (DRAPES) ×2 IMPLANT
DRAPE POUCH INSTRU U-SHP 10X18 (DRAPES) ×2 IMPLANT
DRAPE PROXIMA HALF (DRAPES) ×1 IMPLANT
DRAPE SURG 17X23 STRL (DRAPES) ×7 IMPLANT
DRILL BIT SKYLINE 14MM (BIT) ×2
DRILL NEURO 2X3.1 SOFT TOUCH (MISCELLANEOUS) ×2
DURAPREP 26ML APPLICATOR (WOUND CARE) ×2 IMPLANT
ELECT COATED BLADE 2.86 ST (ELECTRODE) ×2 IMPLANT
ELECT REM PT RETURN 9FT ADLT (ELECTROSURGICAL) ×2
ELECTRODE REM PT RTRN 9FT ADLT (ELECTROSURGICAL) ×1 IMPLANT
EVACUATOR SILICONE 100CC (DRAIN) IMPLANT
GAUZE SPONGE 4X4 12PLY STRL (GAUZE/BANDAGES/DRESSINGS) ×2 IMPLANT
GAUZE SPONGE 4X4 16PLY XRAY LF (GAUZE/BANDAGES/DRESSINGS) ×2 IMPLANT
GLOVE BIO SURGEON STRL SZ7 (GLOVE) ×2 IMPLANT
GLOVE BIO SURGEON STRL SZ8 (GLOVE) ×2 IMPLANT
GLOVE BIOGEL PI IND STRL 7.5 (GLOVE) ×2 IMPLANT
GLOVE BIOGEL PI IND STRL 8 (GLOVE) ×1 IMPLANT
GLOVE BIOGEL PI INDICATOR 7.5 (GLOVE) ×2
GLOVE BIOGEL PI INDICATOR 8 (GLOVE) ×1
GOWN STRL REUS W/ TWL LRG LVL3 (GOWN DISPOSABLE) ×1 IMPLANT
GOWN STRL REUS W/ TWL XL LVL3 (GOWN DISPOSABLE) ×1 IMPLANT
GOWN STRL REUS W/TWL LRG LVL3 (GOWN DISPOSABLE) ×2
GOWN STRL REUS W/TWL XL LVL3 (GOWN DISPOSABLE) ×2
INTERLOCK LRDTC CRVCL VBR 7MM (Bone Implant) IMPLANT
IV CATH 14GX2 1/4 (CATHETERS) ×2 IMPLANT
KIT BASIN OR (CUSTOM PROCEDURE TRAY) ×2 IMPLANT
KIT ROOM TURNOVER OR (KITS) ×2 IMPLANT
LORDOTIC CERVICAL VBR 7MM SM (Bone Implant) ×4 IMPLANT
MANIFOLD NEPTUNE II (INSTRUMENTS) ×1 IMPLANT
NDL SPNL 20GX3.5 QUINCKE YW (NEEDLE) ×1 IMPLANT
NEEDLE 27GAX1X1/2 (NEEDLE) ×2 IMPLANT
NEEDLE SPNL 20GX3.5 QUINCKE YW (NEEDLE) ×2 IMPLANT
NS IRRIG 1000ML POUR BTL (IV SOLUTION) ×2 IMPLANT
OIL CARTRIDGE MAESTRO DRILL (MISCELLANEOUS) ×2
PACK ORTHO CERVICAL (CUSTOM PROCEDURE TRAY) ×2 IMPLANT
PAD ARMBOARD 7.5X6 YLW CONV (MISCELLANEOUS) ×3 IMPLANT
PATTIES SURGICAL .5 X.5 (GAUZE/BANDAGES/DRESSINGS) IMPLANT
PATTIES SURGICAL .5 X1 (DISPOSABLE) ×1 IMPLANT
PIN DISTRACTION 14 (PIN) ×2 IMPLANT
PLATE TWO LEVEL SKYLINE 30MM (Plate) ×1 IMPLANT
PUTTY BONE DBX 2.5 MIS (Bone Implant) ×1 IMPLANT
SCREW SKYLINE VAR OS 14MM (Screw) ×6 IMPLANT
SPONGE INTESTINAL PEANUT (DISPOSABLE) ×3 IMPLANT
SPONGE SURGIFOAM ABS GEL 100 (HEMOSTASIS) ×2 IMPLANT
STRIP CLOSURE SKIN 1/2X4 (GAUZE/BANDAGES/DRESSINGS) ×2 IMPLANT
SURGIFLO W/THROMBIN 8M KIT (HEMOSTASIS) IMPLANT
SUT MNCRL AB 4-0 PS2 18 (SUTURE) ×2 IMPLANT
SUT SILK 4 0 (SUTURE)
SUT SILK 4-0 18XBRD TIE 12 (SUTURE) IMPLANT
SUT VIC AB 2-0 CT2 18 VCP726D (SUTURE) ×2 IMPLANT
SYR BULB IRRIGATION 50ML (SYRINGE) ×2 IMPLANT
SYR CONTROL 10ML LL (SYRINGE) ×4 IMPLANT
TAPE CLOTH 4X10 WHT NS (GAUZE/BANDAGES/DRESSINGS) ×2 IMPLANT
TAPE UMBILICAL COTTON 1/8X30 (MISCELLANEOUS) ×2 IMPLANT
TOWEL OR 17X24 6PK STRL BLUE (TOWEL DISPOSABLE) ×2 IMPLANT
TOWEL OR 17X26 10 PK STRL BLUE (TOWEL DISPOSABLE) ×2 IMPLANT
WATER STERILE IRR 1000ML POUR (IV SOLUTION) ×1 IMPLANT
YANKAUER SUCT BULB TIP NO VENT (SUCTIONS) ×2 IMPLANT

## 2015-08-02 NOTE — Transfer of Care (Signed)
Immediate Anesthesia Transfer of Care Note  Patient: Josabet Mcphail  Procedure(s) Performed: Procedure(s) with comments: ANTERIOR CERVICAL DECOMPRESSION FUSION CERVICAL 6 - THORACIC 1 WITH INSTRUMENTATION AND ALLOGRAFT (N/A) - ANTERIOR CERVICAL DECOMPRESSION FUSION, CERVICAL 6-7,CERVICAL 7-THORACIC 7 WITH INSTRUMENTATION AND ALLOGRAFT  Patient Location: PACU  Anesthesia Type:General  Level of Consciousness: awake, alert , oriented and patient cooperative  Airway & Oxygen Therapy: Patient Spontanous Breathing and Patient connected to face mask oxygen  Post-op Assessment: Report given to RN, Post -op Vital signs reviewed and stable, Patient moving all extremities X 4 and Patient able to stick tongue midline  Post vital signs: Reviewed and stable  Last Vitals:  Filed Vitals:   08/02/15 0722  BP: 110/57  Pulse: 67  Temp: 36.6 C  Resp: 16    Last Pain:  Filed Vitals:   08/02/15 0745  PainSc: 4       Patients Stated Pain Goal: 3 ([id number])  Complications: No apparent anesthesia complications

## 2015-08-02 NOTE — Anesthesia Postprocedure Evaluation (Signed)
Anesthesia Post Note  Patient: [name]  Procedure(s) Performed: Procedure(s) (LRB): ANTERIOR CERVICAL DECOMPRESSION FUSION CERVICAL 6 - THORACIC 1 WITH INSTRUMENTATION AND ALLOGRAFT (N/A)  Patient location during evaluation: PACU Anesthesia Type: General Level of consciousness: awake and alert Pain management: pain level controlled Vital Signs Assessment: post-procedure vital signs reviewed and stable Respiratory status: spontaneous breathing, nonlabored ventilation, respiratory function stable and patient connected to nasal cannula oxygen Cardiovascular status: blood pressure returned to baseline and stable Postop Assessment: no signs of nausea or vomiting Anesthetic complications: no    Last Vitals:  Filed Vitals:   08/02/15 1304 08/02/15 1315  BP: 130/67   Pulse: 78 73  Temp:    Resp: 20 16    Last Pain:  Filed Vitals:   08/02/15 1318  PainSc: 8                  Kazi Montoro L

## 2015-08-02 NOTE — Anesthesia Procedure Notes (Signed)
Procedure Name: Intubation Date/Time: 08/02/2015 8:46 AM Performed by: Collier Bullock Pre-anesthesia Checklist: Patient identified, Emergency Drugs available, Suction available and Patient being monitored Patient Re-evaluated:Patient Re-evaluated prior to inductionOxygen Delivery Method: Circle system utilized Preoxygenation: Pre-oxygenation with 100% oxygen Intubation Type: IV induction Ventilation: Mask ventilation without difficulty Laryngoscope Size: Glidescope (T4) Grade View: Grade I Tube type: Oral Tube size: 7.0 mm Number of attempts: 1 Airway Equipment and Method: Video-laryngoscopy Secured at: 22 cm Tube secured with: Tape Dental Injury: Teeth and Oropharynx as per pre-operative assessment

## 2015-08-02 NOTE — Op Note (Signed)
NAMEMarland Padilla  WILHELMINE, DELISA NO.:  [id number]  MEDICAL RECORD NO.:  [id number]  LOCATION:  [id number]                        FACILITY:  [facility]  PHYSICIAN:  Phylliss Bob, MD      DATE OF BIRTH:  1960/10/17  DATE OF PROCEDURE:  08/02/2015                              OPERATIVE REPORT   PREOPERATIVE DIAGNOSES: 1. Right-sided C8 radiculopathy. 2. Right-sided neural foraminal stenosis; C7, T1. 3. Status post previous C5-6 anterior cervical decompression and     fusion. 4. Minimal stenosis at C6-7.  POSTOPERATIVE DIAGNOSES: 1. Right-sided C8 radiculopathy. 2. Right-sided neural foraminal stenosis; C7, T1. 3. Status post previous C5-6 anterior cervical decompression and     fusion. 4. Minimal stenosis at C6-7.  PROCEDURE: 1. Anterior cervical decompression and fusion; C6-7, C7-T1. 2. Placement of anterior instrumentation, C6 to T1. 3. Insertion of interbody device x2 (Titan intervertebral spacers x2). 4. Use of morselized allograft. 5. Intraoperative use of fluoroscopy. 6. Removal of previously placed anterior instrumentation, C5-6.  SURGEON:  Phylliss Bob, MD.  ASSISTANTPricilla Holm, PA-C.  ANESTHESIA:  General endotracheal anesthesia.  COMPLICATIONS:  None.  DISPOSITION:  Stable.  ESTIMATED BLOOD LOSS:  Minimal.  INDICATIONS FOR SURGERY:  Briefly, Ms. Hilgeman is a very pleasant 55- year-old female, who is status post a C5-6 ACDF procedure by me approximately 2 years ago.  The patient did very well from the standpoint of that surgery.  The patient did also have a laminotomy on the right side at the C7-T1 level, decompressing the right C8 nerve. She also did well from that procedure, however, more recently, has been complaining of pain very much in the distribution of the right C8 nerve. The patient's pain was very classic for C8 radiculopathy, as her pain did involve the medial arm and forearm and had to go into the ulnar aspect of her hand.  The MRI  findings were minimal to moderate, however, given the very clear distribution of her pain, I did express to her that there was a possibility that proceeding with a decompression and fusion procedure would help alleviate her pain.  She did understand that given the minimal findings on her MRI, there also was a possibility that proceeding with surgery would not eliminate her pain.  I did estimate specifically a 50-60% chance of pain alleviation, the patient did elect to proceed with surgery.  OPERATIVE DETAILS:  On August 02, 2015, the patient was brought to surgery and general endotracheal anesthesia was administered.  The patient was placed supine on the hospital bed.  The patient's neck was gently extended.  The patient's arms were secured to her sides very gently.  Of note, she is status post a right shoulder surgery, and we were very gentle with manipulation of the shoulder, and the manipulation was very minimal.  The patient's arms were secured to her sides.  The patient's shoulders were taped to the inferior aspect of the bed.  The neck was prepped and draped and a time-out was performed.  A right-sided transverse incision was then made overlying the C6-7 intervertebral space.  The platysma was incised.  I then developed a plane between the sternocleidomastoid muscle and the strap muscles.  A  plane was then developed between the esophagus and the carotid artery.  The anterior spine was noted.  The previously placed hardware at C5-6 was identified and removed uneventfully.  Bone wax was placed in the previously placed screw holes.  Next, I proceeding with subperiosteal exposure of the vertebral bodies at C6, C7, and T1, anterior osteophytes were removed. A self-retaining retractor was placed.  I then placed Caspar pins into the C7 and T1 vertebral bodies and distraction was applied.  I then proceeded with a thorough and complete C7-T1 intervertebral diskectomy. The right neural  foramen was evaluated very thoroughly and was noted to be decompressed using a nerve hook.  I was very pleased with the decompression, and I was able to confirm no residual compression of the right C8 nerve.  The endplates were then prepared and the appropriate size interbody spacer was packed with DBX mix and tamped into position in the usual fashion.  The lower Caspar pin was removed and placed into the C6 vertebral body.  Distraction was then applied across the C6-7 intervertebral space.  A diskectomy was again performed, and a bilateral neural foraminal decompression was also performed and confirmed.  After preparing the endplates, the appropriate size intervertebral spacer was packed with DBX mix and tamped into position in the usual fashion.  The Caspar pins were then removed.  The bone wax was placed in that place. I then chose the appropriate-sized anterior cervical plate, which was placed over the anterior spine.  A 14 mm screws were placed, 2 in each vertebral body from C6 to T1 for a total of 6 vertebral body screws. The screws were then locked to the plate using the CAM locking mechanism.  The wound was then copiously irrigated.  I was very pleased with the final fluoroscopic images.  All bleeding was controlled at the termination of the procedure.  The platysma was then closed using 2-0 Vicryl, and the skin was closed using 3-0 Monocryl.  Benzoin and Steri- Strips were applied followed by sterile dressing.  All instrument counts were correct at the termination of the procedure.  Of note, Pricilla Holm, was my assistant throughout surgery and did aid in retraction, suctioning, and closure from start to finish.     Phylliss Bob, MD     MD/MEDQ  D:  08/02/2015  T:  08/02/2015  Job:  [id number]

## 2015-08-03 ENCOUNTER — Encounter (HOSPITAL_COMMUNITY): Payer: Self-pay | Admitting: Orthopedic Surgery

## 2015-08-03 ENCOUNTER — Observation Stay (HOSPITAL_COMMUNITY): Payer: BLUE CROSS/BLUE SHIELD

## 2015-08-03 DIAGNOSIS — M5412 Radiculopathy, cervical region: Secondary | ICD-10-CM | POA: Diagnosis not present

## 2015-08-03 DIAGNOSIS — M4803 Spinal stenosis, cervicothoracic region: Secondary | ICD-10-CM | POA: Diagnosis not present

## 2015-08-03 DIAGNOSIS — M797 Fibromyalgia: Secondary | ICD-10-CM | POA: Diagnosis not present

## 2015-08-03 DIAGNOSIS — M4802 Spinal stenosis, cervical region: Secondary | ICD-10-CM | POA: Diagnosis not present

## 2015-08-03 DIAGNOSIS — E669 Obesity, unspecified: Secondary | ICD-10-CM | POA: Diagnosis not present

## 2015-08-03 DIAGNOSIS — Z981 Arthrodesis status: Secondary | ICD-10-CM | POA: Diagnosis not present

## 2015-08-03 DIAGNOSIS — R0781 Pleurodynia: Secondary | ICD-10-CM

## 2015-08-03 LAB — TROPONIN I
Troponin I: <0.03 ng/mL (ref <0.031)
Troponin I: <0.03 ng/mL (ref <0.031)
Troponin I: <0.03 ng/mL (ref <0.031)

## 2015-08-03 MED ORDER — DOXYCYCLINE HYCLATE 100 MG PO TABS: 100.0000 mg | ORAL_TABLET | Freq: Two times a day (BID) | ORAL | Status: DC

## 2015-08-03 MED ORDER — DOXYCYCLINE HYCLATE 100 MG PO TABS
100.0000 mg | ORAL_TABLET | Freq: Two times a day (BID) | ORAL | Status: DC
  Administered 2015-08-03: 100 mg via ORAL
  Filled 2015-08-03 (×2): qty 1

## 2015-08-03 MED FILL — Thrombin For Soln 20000 Unit: CUTANEOUS | Qty: 1 | Status: AC

## 2015-08-03 NOTE — Progress Notes (Signed)
Orthopedic Tech Progress Note Patient Details:  [name] Nov 25, 1960 [id number]  Ortho Devices Type of Ortho Device: [place] cervical collar Ortho Device/Splint Location: at bedside Ortho Device/Splint Interventions: Ordered, Application   Braulio Bosch 08/03/2015, 8:39 PM

## 2015-08-03 NOTE — Consult Note (Addendum)
Medical Consultation   [name]  [id number]  DOB: 01/02/1961  DOA: 08/02/2015  PCP: Alesia Richards, MD   Outpatient Specialists:     Requesting physician: Dr. Lynann Bologna  Reason for consultation: "chest pain"   History of Present Illness: [name] is an 55 y.o. female with PMHX of fibromyalgia, HLD, depression who was in hospital for right sided C8 radiculopathy and had an anterior cervical decompression and fusion done on 08/02/15 under general anesthesia.  She was in the process of being discharged when she complained of "deep chest pain" radiating through to her back but not "tearing" in nature.  Pain much worse with breathing and she is unable to take a deep breath.  No fever, no chills, no calf pain.   Patient does admit to smoking a few cigarettes in the days preceding her surgery due to anxiety.   Patient says she has not cardiac history but also says she had a cardiac cath "years" ago but I could find no record in her chart.    Review of Systems:  ROS As per HPI otherwise 10 point review of systems negative.    Past Medical History: Past Medical History  Diagnosis Date  . Fibromyalgia   . Hyperlipidemia   . COPD (chronic obstructive pulmonary disease) ([place])   . Prediabetes   . Restless leg syndrome   . Hypertension     normal now, only took medications for 1 month- thinks it was from pain.  Marland Kitchen Hypothyroidism   . Hemorrhoid   . Anxiety   . Depression   . ADD (attention deficit disorder)   . Bladder neoplasm   . History of melanoma excision     leg  . Iron deficiency anemia   . Cancer [facility]) 2015    Bladder cancer  . Shortness of breath dyspnea     with exertion  . H/O bronchitis     treated within the last week w steroids, antibx tessalon pearls.   . SUI (stress urinary incontinence, female)   . Fibroids   . Wears glasses   . H/O sebaceous cyst 07/18/14    left breast   . Shoulder impingement     right  . PONV  (postoperative nausea and vomiting)     used a scop patch last surg-did well    Past Surgical History: Past Surgical History  Procedure Laterality Date  . Ankle fusion Right 2011  . Knee arthroscopy with medial menisectomy Right 05/22/2012    Procedure: KNEE ARTHROSCOPY WITH MEDIAL MENISECTOMY and Medial Plica excision;  Surgeon: Alta Corning, MD;  Location: [place];  Service: Orthopedics;  Laterality: Right;  . Anterior cervical decomp/discectomy fusion N/A 09/01/2013    Procedure: Anterior cervical decompression fusion, cervical 5-6 with instrumentation and allograft;  Surgeon: Sinclair Ship, MD;  Location: [place];  Service: Orthopedics;  Laterality: N/A;  Anterior cervical decompression fusion, cervical 5-6 with instrumentation and allograft  . Shoulder arthroscopy with subacromial decompression Left 11/10/2013    Procedure: SHOULDER ARTHROSCOPY WITH SUBACROMIAL DECOMPRESSION WITH ROTATOR CUFF REPAIR AND BICEPS TENODESIS;  Surgeon: Alta Corning, MD;  Location: [place];  Service: Orthopedics;  Laterality: Left;  . Closed reduction and pinning left fifth  proximal phalangeal fx  10-09-2005  . D & c hysteroscopy with novasure endometrial ablation  07-16-2006  . Shoulder arthroscopy w/ subacromial decompression and distal clavicle excision Left 11-27-2011  W/  DEBRIDEMENT  . Cataract extraction w/ intraocular lens implant Bilateral   . Tonsillectomy  age 34  . Laparoscopic cholecystectomy  2005  . Tubal ligation  1980  . Transurethral resection of bladder tumor N/A 01/11/2014    Procedure: TRANSURETHRAL RESECTION OF BLADDER TUMOR (TURBT);  Surgeon: Festus Aloe, MD;  Location: [facility];  Service: Urology;  Laterality: N/A;  . Cystoscopy w/ retrogrades Left 01/11/2014    Procedure: CYSTOSCOPY WITH RETROGRADE PYELOGRAM;  Surgeon: Festus Aloe, MD;  Location: [facility];  Service: Urology;  Laterality: Left;  .  Cystoscopy with stent placement Left 01/11/2014    Procedure: CYSTOSCOPY WITH STENT PLACEMENT;  Surgeon: Festus Aloe, MD;  Location: [facility];  Service: Urology;  Laterality: Left;  . Laparoscopic assisted vaginal hysterectomy N/A 08/29/2014    Procedure: LAPAROSCOPIC ASSISTED VAGINAL HYSTERECTOMY;  Surgeon: Molli Posey, MD;  Location: [facility];  Service: Gynecology;  Laterality: N/A;  . Salpingoophorectomy Bilateral 08/29/2014    Procedure: SALPINGO OOPHORECTOMY;  Surgeon: Molli Posey, MD;  Location: [facility];  Service: Gynecology;  Laterality: Bilateral;  . Pubovaginal sling N/A 08/29/2014    Procedure: Okey Dupre SINGLE INCISION SLING;  Surgeon: Molli Posey, MD;  Location: [facility];  Service: Gynecology;  Laterality: N/A;  . Laparotomy N/A 08/29/2014    Procedure: EXPLORATORY LAPAROTOMY WITH LIGATION OF BLEEDER;  Surgeon: Molli Posey, MD;  Location: [facility];  Service: Gynecology;  Laterality: N/A;  . Shoulder arthroscopy with subacromial decompression Right 12/21/2014    Procedure: SHOULDER ARTHROSCOPY WITH SUBACROMIAL DECOMPRESSION;  Surgeon: Dorna Leitz, MD;  Location: [place];  Service: Orthopedics;  Laterality: Right;  . Shoulder arthroscopy with distal clavicle resection Right 12/21/2014    Procedure: SHOULDER ARTHROSCOPY WITH DISTAL CLAVICLE RESECTION;  Surgeon: Dorna Leitz, MD;  Location: [place];  Service: Orthopedics;  Laterality: Right;  . Shoulder arthroscopy with bicepstenotomy Right 12/21/2014    Procedure: SHOULDER ARTHROSCOPY WITH BICEPSTENOTOMY;  Surgeon: Dorna Leitz, MD;  Location: [place];  Service: Orthopedics;  Laterality: Right;  . Abdominal hysterectomy    . Posterior cervical fusion/foraminotomy N/A 01/25/2015    Procedure: POSTERIOR CERVICAL DECOMPRESSSION CERVICAL 7-THORACIC 1;  Surgeon: Phylliss Bob, MD;  Location: [place];  Service:  Orthopedics;  Laterality: N/A;  Posterior cervical decompression, cervical 5-6, cervical 6-7, cervical 7-thoracic 1   . Shoulder arthroscopy with open rotator cuff repair Right 07/12/2015    Procedure: SHOULDER ARTHROSCOPY WITH ACROMIOPLASTY AND OPEN ROTATOR CUFF REPAIR;  Surgeon: Dorna Leitz, MD;  Location: [place];  Service: Orthopedics;  Laterality: Right;     Allergies:   Allergies  Allergen Reactions  . Sulfa Antibiotics Hives and Itching     Social History:  reports that she quit smoking about 6 years ago. Her smoking use included Cigarettes. She has a 30 pack-year smoking history. She has never used smokeless tobacco. She reports that she does not drink alcohol or use illicit drugs.   Family History: Family History  Problem Relation Age of Onset  . Cancer Father     Lung and stomach   . Heart disease Father   . Hypertension Father   . Stroke Father   . Heart failure Paternal Grandfather   . Cancer Paternal Grandfather     colon cancer  . Mental retardation Son   . Ovarian cancer Mother 79  . Breast cancer Mother     dx in her late 35s-50s  .  Thyroid cancer Mother   . Ovarian cancer Sister 63  . Cancer Brother     abdominal tumor  . Ovarian cancer Maternal Aunt   . Breast cancer Maternal Aunt   . Breast cancer Maternal Uncle   . Breast cancer Paternal Aunt   . Ovarian cancer Sister 50  . Ovarian cancer Sister 67  . Cancer Brother     abdominal tumor  . Breast cancer Paternal Aunt   . Breast cancer Paternal Aunt   . Breast cancer Paternal Aunt   . Breast cancer Cousin   . Breast cancer Cousin       Physical Exam: Filed Vitals:   08/02/15 2022 08/03/15 0001 08/03/15 0400 08/03/15 0742  BP: 110/49 97/48 112/45 103/53  Pulse: 70 58 60 88  Temp: 98.2 F (36.8 C) 99 F (37.2 C) 98.4 F (36.9 C) 99.1 F (37.3 C)  TempSrc: Oral Oral Oral   Resp: 16 18 18 16   Height:      Weight:      SpO2: 100% 100% 99% 100%    Constitutional: in  neck brace Eyes: PERLA, EOMI, irises appear normal, anicteric sclera,  ENMT: external ears and nose appear normal, normal hearing            Lips appears normal, oropharynx mucosa, tongue, posterior pharynx appear normal  Neck: in neck brace CVS: S1-S2 clear, no murmur rubs or gallops, no LE edema, normal pedal pulses  Respiratory:  clear to auscultation bilaterally, no wheezing: right lower lung rhonchi. Respiratory effort normal. No accessory muscle use.  Abdomen: soft nontender, nondistended, normal bowel sounds, no hepatosplenomegaly, no hernias  Musculoskeletal: : no cyanosis, clubbing or edema noted bilaterally                   Neuro: moves all 4 ext Psych: judgement and insight appear normal, stable mood and affect, mental status Skin: no rashes or lesions or ulcers, no induration or nodules     Data reviewed:  I have personally reviewed following labs and imaging studies Labs:  CBC:  Recent Labs Lab 08/02/15 0713  WBC 8.6  HGB 14.3  HCT 42.9  MCV 86.7  PLT [id number]    Basic Metabolic Panel:  Recent Labs Lab 08/02/15 0713  NA 139  K 4.1  CL 105  CO2 26  GLUCOSE 104*  BUN 22*  CREATININE 0.73  CALCIUM 9.5   GFR Estimated Creatinine Clearance: 81.3 mL/min (by C-G formula based on Cr of 0.73). Liver Function Tests: No results for input(s): AST, ALT, ALKPHOS, BILITOT, PROT, ALBUMIN in the last 168 hours. No results for input(s): LIPASE, AMYLASE in the last 168 hours. No results for input(s): AMMONIA in the last 168 hours. Coagulation profile No results for input(s): INR, PROTIME in the last 168 hours.  Cardiac Enzymes: No results for input(s): CKTOTAL, CKMB, CKMBINDEX, TROPONINI in the last 168 hours. BNP: Invalid input(s): POCBNP CBG: No results for input(s): GLUCAP in the last 168 hours. D-Dimer No results for input(s): DDIMER in the last 72 hours. Hgb A1c No results for input(s): HGBA1C in the last 72 hours. Lipid Profile No results for input(s): CHOL,  HDL, LDLCALC, TRIG, CHOLHDL, LDLDIRECT in the last 72 hours. Thyroid function studies No results for input(s): TSH, T4TOTAL, T3FREE, THYROIDAB in the last 72 hours.  Invalid input(s): FREET3 Anemia work up No results for input(s): VITAMINB12, FOLATE, FERRITIN, TIBC, IRON, RETICCTPCT in the last 72 hours. Urinalysis    Component Value Date/Time   COLORURINE  YELLOW 01/17/2015 [place] 01/17/2015 1048   LABSPEC 1.016 01/17/2015 1048   PHURINE 7.0 01/17/2015 1048   GLUCOSEU NEGATIVE 01/17/2015 1048   HGBUR NEGATIVE 01/17/2015 1048   [place] 01/17/2015 1048   KETONESUR NEGATIVE 01/17/2015 1048   PROTEINUR NEGATIVE 01/17/2015 1048   UROBILINOGEN 0.2 01/17/2015 1048   NITRITE NEGATIVE 01/17/2015 1048   LEUKOCYTESUR NEGATIVE 01/17/2015 1048     Sepsis Labs Invalid input(s): PROCALCITONIN,  WBC,  LACTICIDVEN Microbiology Recent Results (from the past 240 hour(s))  Surgical pcr screen     Status: None   Collection Time: 08/02/15  7:46 AM  Result Value Ref Range Status   MRSA, PCR NEGATIVE NEGATIVE Final   Staphylococcus aureus NEGATIVE NEGATIVE Final    Comment:        The Xpert SA Assay (FDA approved for NASAL specimens in patients over 71 years of age), is one component of a comprehensive surveillance program.  Test performance has been validated by [facility] for patients greater than or equal to 20 year old. It is not intended to diagnose infection nor to guide or monitor treatment.        Inpatient Medications:   Scheduled Meds: . cholecalciferol  7,000 Units Oral QHS  . clonazePAM  2 mg Oral BID  . docusate sodium  100 mg Oral BID  . escitalopram  20 mg Oral Daily  . levothyroxine  75 mcg Oral QAC breakfast  . magnesium oxide  400 mg Oral Daily  . predniSONE  1 mg Oral Q breakfast  . sodium chloride flush  3 mL Intravenous Q12H   Continuous Infusions:    Radiological Exams on Admission: Dg Cervical Spine 1 View  08/02/2015   CLINICAL DATA: Cervical spine surgery. EXAM: DG C-ARM 61-120 MIN; DG CERVICAL SPINE - 1 VIEW COMPARISON:  CT 06/21/2015. FINDINGS: Lower cervical spine fusion. Cervical spine numbering is difficult due to positioning and intraoperative technique. 0 minutes 6 seconds fluoroscopy time. Three images obtained. IMPRESSION: Lower cervical spine fusion. Electronically Signed   By: Marcello Moores  Register   On: 08/02/2015 12:21   Dg C-arm 1-60 Min  08/02/2015  CLINICAL DATA: Cervical spine surgery. EXAM: DG C-ARM 61-120 MIN; DG CERVICAL SPINE - 1 VIEW COMPARISON:  CT 06/21/2015. FINDINGS: Lower cervical spine fusion. Cervical spine numbering is difficult due to positioning and intraoperative technique. 0 minutes 6 seconds fluoroscopy time. Three images obtained. IMPRESSION: Lower cervical spine fusion. Electronically Signed   By: Marcello Moores  Register   On: 08/02/2015 12:21   Sinus brady  Impression/Recommendations Active Problems:   Fibromyalgia   COPD (chronic obstructive pulmonary disease) ([facility])   Hypothyroidism   Obesity   Radicular pain   Pleuritic chest pain  pleuritic chest pain -dg 2 view  -cycle CE -EKG  Obesity -encourage weight loss  COPD - no wheezing   UPDATE: x ray suggestive of PNA/atelectasis-- given IS and doxy for PNA   Thank you for this consultation.  Our [facility] hospitalist team will follow the patient with you.   Time Spent: 35 min  Nilaya Bouie U Kihanna Kamiya DO. [facility] 08/03/2015, 8:30 AM

## 2015-08-03 NOTE — Progress Notes (Signed)
Discharge instructions/education/Rx given to patient with husband at bedside and they both verbalized understanding. Pain is low to moderate, no swelling, no redness and no drainage noted on incision site. No other c/o noted. The couple are very anxious to go home, Patient D/C via wheelchair.

## 2015-08-03 NOTE — Progress Notes (Signed)
EKG and CE ok Suspect PNA- doxy x 5 days-- if 3rd CE negative, can go home

## 2015-08-03 NOTE — Progress Notes (Signed)
    Patient doing well Patient reports resolution of R arm pain Patient does however report SOB and substernal chest pain, she states that the pain is "deep" Has been ambulating   Physical Exam: Filed Vitals:   08/03/15 0001 08/03/15 0400  BP: 97/48 112/45  Pulse: 58 60  Temp: 99 F (37.2 C) 98.4 F (36.9 C)  Resp: 18 18    Dressing in place NVI Neck soft/supple No TTP to sternal region  POD #1 s/p C5-7 ACDF doing well from the standpoint of her surgery. Patient does report chest pain and SOB. I doubt that this is due to any cardiac or pulmonary etiology, but I do feel that this needs to be ruled out.  - encourage ambulation - Percocet for pain, Valium for muscle spasms - will consult [facility] for evaluation of chest pain, if all checks out OK, will d/c home later today

## 2015-08-14 ENCOUNTER — Encounter: Payer: Self-pay | Admitting: Physician Assistant

## 2015-08-14 ENCOUNTER — Other Ambulatory Visit: Payer: Self-pay

## 2015-08-14 ENCOUNTER — Ambulatory Visit (INDEPENDENT_AMBULATORY_CARE_PROVIDER_SITE_OTHER): Payer: BLUE CROSS/BLUE SHIELD | Admitting: Physician Assistant

## 2015-08-14 VITALS — BP 120/60 | HR 72 | Temp 97.2°F | Resp 16 | Ht 65.0 in | Wt 191.0 lb

## 2015-08-14 DIAGNOSIS — J449 Chronic obstructive pulmonary disease, unspecified: Secondary | ICD-10-CM

## 2015-08-14 DIAGNOSIS — E669 Obesity, unspecified: Secondary | ICD-10-CM | POA: Diagnosis not present

## 2015-08-14 DIAGNOSIS — G2581 Restless legs syndrome: Secondary | ICD-10-CM | POA: Diagnosis not present

## 2015-08-14 DIAGNOSIS — R6889 Other general symptoms and signs: Secondary | ICD-10-CM

## 2015-08-14 DIAGNOSIS — K2100 Gastro-esophageal reflux disease with esophagitis, without bleeding: Secondary | ICD-10-CM

## 2015-08-14 DIAGNOSIS — F329 Major depressive disorder, single episode, unspecified: Secondary | ICD-10-CM

## 2015-08-14 DIAGNOSIS — I10 Essential (primary) hypertension: Secondary | ICD-10-CM

## 2015-08-14 DIAGNOSIS — G608 Other hereditary and idiopathic neuropathies: Secondary | ICD-10-CM

## 2015-08-14 DIAGNOSIS — F988 Other specified behavioral and emotional disorders with onset usually occurring in childhood and adolescence: Secondary | ICD-10-CM

## 2015-08-14 DIAGNOSIS — E559 Vitamin D deficiency, unspecified: Secondary | ICD-10-CM

## 2015-08-14 DIAGNOSIS — I700 Atherosclerosis of aorta: Secondary | ICD-10-CM

## 2015-08-14 DIAGNOSIS — C801 Malignant (primary) neoplasm, unspecified: Secondary | ICD-10-CM

## 2015-08-14 DIAGNOSIS — E079 Disorder of thyroid, unspecified: Secondary | ICD-10-CM | POA: Diagnosis not present

## 2015-08-14 DIAGNOSIS — E2839 Other primary ovarian failure: Secondary | ICD-10-CM

## 2015-08-14 DIAGNOSIS — F3181 Bipolar II disorder: Secondary | ICD-10-CM

## 2015-08-14 DIAGNOSIS — M797 Fibromyalgia: Secondary | ICD-10-CM

## 2015-08-14 DIAGNOSIS — G629 Polyneuropathy, unspecified: Secondary | ICD-10-CM

## 2015-08-14 DIAGNOSIS — F909 Attention-deficit hyperactivity disorder, unspecified type: Secondary | ICD-10-CM | POA: Diagnosis not present

## 2015-08-14 DIAGNOSIS — R7303 Prediabetes: Secondary | ICD-10-CM | POA: Diagnosis not present

## 2015-08-14 DIAGNOSIS — D649 Anemia, unspecified: Secondary | ICD-10-CM

## 2015-08-14 DIAGNOSIS — Z79899 Other long term (current) drug therapy: Secondary | ICD-10-CM | POA: Diagnosis not present

## 2015-08-14 DIAGNOSIS — E785 Hyperlipidemia, unspecified: Secondary | ICD-10-CM | POA: Diagnosis not present

## 2015-08-14 DIAGNOSIS — Z0001 Encounter for general adult medical examination with abnormal findings: Secondary | ICD-10-CM

## 2015-08-14 DIAGNOSIS — F32A Depression, unspecified: Secondary | ICD-10-CM

## 2015-08-14 LAB — LIPID PANEL
Cholesterol: 210 mg/dL — ABNORMAL HIGH (ref 125–200)
HDL: 58 mg/dL (ref >=46)
LDL Cholesterol: 136 mg/dL — ABNORMAL HIGH (ref <130)
Total CHOL/HDL Ratio: 3.6 Ratio (ref <=5.0)
Triglycerides: 79 mg/dL (ref <150)
VLDL: 16 mg/dL (ref <30)

## 2015-08-14 LAB — CBC WITH DIFFERENTIAL/PLATELET
Basophils Absolute: 0 cells/uL (ref 0–200)
Basophils Relative: 0 %
Eosinophils Absolute: 210 cells/uL (ref 15–500)
Eosinophils Relative: 2 %
HCT: 43.2 % (ref 35.0–45.0)
Hemoglobin: 14.5 g/dL (ref 11.7–15.5)
Lymphocytes Relative: 37 %
Lymphs Abs: 3885 cells/uL (ref 850–3900)
MCH: 29.9 pg (ref 27.0–33.0)
MCHC: 33.6 g/dL (ref 32.0–36.0)
MCV: 89.1 fL (ref 80.0–100.0)
MPV: 10.1 fL (ref 7.5–12.5)
Monocytes Absolute: 420 cells/uL (ref 200–950)
Monocytes Relative: 4 %
Neutro Abs: 5985 cells/uL (ref 1500–7800)
Neutrophils Relative %: 57 %
Platelets: 314 10*3/uL (ref 140–400)
RBC: 4.85 MIL/uL (ref 3.80–5.10)
RDW: 13.3 % (ref 11.0–15.0)
WBC: 10.5 10*3/uL (ref 3.8–10.8)

## 2015-08-14 LAB — BASIC METABOLIC PANEL WITH GFR
BUN: 21 mg/dL (ref 7–25)
CO2: 26 mmol/L (ref 20–31)
Calcium: 9.6 mg/dL (ref 8.6–10.4)
Chloride: 103 mmol/L (ref 98–110)
Creat: 0.7 mg/dL (ref 0.50–1.05)
GFR, Est African American: >89 mL/min (ref >=60)
GFR, Est Non African American: >89 mL/min (ref >=60)
Glucose, Bld: 94 mg/dL (ref 65–99)
Potassium: 4.4 mmol/L (ref 3.5–5.3)
Sodium: 140 mmol/L (ref 135–146)

## 2015-08-14 LAB — HEPATIC FUNCTION PANEL
ALT: 21 U/L (ref 6–29)
AST: 22 U/L (ref 10–35)
Albumin: 4.3 g/dL (ref 3.6–5.1)
Alkaline Phosphatase: 98 U/L (ref 33–130)
Bilirubin, Direct: 0.1 mg/dL (ref <=0.2)
Indirect Bilirubin: 0.2 mg/dL (ref 0.2–1.2)
Total Bilirubin: 0.3 mg/dL (ref 0.2–1.2)
Total Protein: 7 g/dL (ref 6.1–8.1)

## 2015-08-14 LAB — IRON AND TIBC
%SAT: 21 % (ref 11–50)
Iron: 66 ug/dL (ref 45–160)
TIBC: 307 ug/dL (ref 250–450)
UIBC: 241 ug/dL (ref 125–400)

## 2015-08-14 LAB — FERRITIN: Ferritin: 88 ng/mL (ref 10–232)

## 2015-08-14 LAB — TSH: TSH: 4.03 mIU/L

## 2015-08-14 LAB — HEMOGLOBIN A1C
Hgb A1c MFr Bld: 6.1 % — ABNORMAL HIGH (ref <5.7)
Mean Plasma Glucose: 128 mg/dL

## 2015-08-14 LAB — MAGNESIUM: Magnesium: 2.1 mg/dL (ref 1.5–2.5)

## 2015-08-14 LAB — VITAMIN B12: Vitamin B-12: 404 pg/mL (ref 200–1100)

## 2015-08-14 MED ORDER — DIVALPROEX SODIUM 250 MG PO DR TAB: 250.0000 mg | DELAYED_RELEASE_TABLET | Freq: Three times a day (TID) | ORAL | Status: DC

## 2015-08-14 MED ORDER — AMPHETAMINE-DEXTROAMPHETAMINE 20 MG PO TABS: ORAL_TABLET | ORAL | Status: DC

## 2015-08-14 MED ORDER — CLONAZEPAM 2 MG PO TABS: 2.0000 mg | ORAL_TABLET | Freq: Two times a day (BID) | ORAL | Status: DC

## 2015-08-14 NOTE — Patient Instructions (Addendum)
Add on depakote 250mg  at night for 2 weeks,  Then add 250mg  in the morning for 2 weeks,  Can add on 3rd or just stay on 2 a day   Add on zantac 300mg  at night for your stomach   Counseling services    OR CALL YOUR INSURANCE AND SEE WHO IS IN THE NETWORK OR CALL [phone] [facility]  THESE ARE JUST COUNSELORS BELOW, MAY BE BETTER IF YOU PSYCH  Dr. Arbutus Leas, Ph.D. [address], [place] [address] Phone: [place], [place] [phone] [place] [address] [place] [address]   [place].  Address: [place], [place] [address] [place] [facility] [phone] office [email or web address] [address], [place], [place], [place] [address]  Rema Fendt, therapist  Toy Cookey, MA, clinical psychologist  Cognitive-Behavior Therapy; Mood Disorders; Anxiety Disorders; adult and child ADHD; Family Therapy; Stress Management; personal growth, and Marital Therapy.    Terrance Mass Ph.D., clinical psychologist Cognitive-Behavior Therapy; Mood Disorders; Anxiety Disorders; Stress     Management  [facility] [address], [place], [place] [address] [phone]   [facility], psychotherapist [address] [place], [place] [address] [phone]  Karin Golden Ph.D., clinical psychologist [phone] office [place], [place] [address] Cognitive Behavior Therapy, Depression, Bipolar, Anxiety, Grief and Loss

## 2015-08-14 NOTE — Progress Notes (Signed)
Complete Physical Assessment and Plan: 1. Essential hypertension - continue medications, DASH diet, exercise and monitor at home. Call if greater than 130/80.  - CBC with Differential/Platelet - BASIC METABOLIC PANEL WITH GFR - TSH - Hepatic function panel - Microalbumin / creatinine urine ratio - EKG 12-Lead - Urinalysis, Routine w reflex microscopic  2. Chronic obstructive pulmonary disease, unspecified COPD, unspecified chronic bronchitis type Q smoking 2011, no need for ABX at this time and patient agrees.  - DG Chest 2 View; Future - albuterol (VENTOLIN HFA) 108 (90 BASE) MCG/ACT inhaler; Inhale 2 puffs into the lungs every 4 (four) hours as needed for wheezing or shortness of breath.  Dispense: 1 Inhaler; Refill: 3 - montelukast (SINGULAIR) 10 MG tablet; Take 1 tablet (10 mg total) by mouth daily.  Dispense: 30 tablet; Refill: 2 - benzonatate (TESSALON PERLES) 100 MG capsule; Take 2 capsules (200 mg total) by mouth 3 (three) times daily as needed for cough (Max: 600mg  per day (6 capsules per day)).  Dispense: 120 capsule; Refill: 0  3. Gastroesophageal reflux disease with esophagitis Continue PPI/H2 blocker, diet discussed  4. Prediabetes Discussed general issues about diabetes pathophysiology and management., Educational material distributed., Suggested low cholesterol diet., Encouraged aerobic exercise., Discussed foot care., Reminded to get yearly retinal exam. - Hemoglobin A1c - Insulin, fasting  5. Thyroid disease Hypothyroidism-check TSH level, continue medications the same, reminded to take on an empty stomach 30-72mins before food.  - levothyroxine (SYNTHROID, LEVOTHROID) 75 MCG tablet; Take 1 tablet (75 mcg total) by mouth daily before breakfast.  Dispense: 90 tablet; Refill: 0  6. Peripheral sensory neuropathy Continue medications and follow up  7. Fibromyalgia Continue follow up with ortho, declines pain management at this time - gabapentin (NEURONTIN) 300 MG  capsule; Take 1 capsule (300 mg total) by mouth 3 (three) times daily.  Dispense: 90 capsule; Refill: 2 - amphetamine-dextroamphetamine (ADDERALL) 20 MG tablet; Take 1 tablet (20 mg total) by mouth 2 (two) times daily.  Dispense: 60 tablet; Refill: 0  8. Hyperlipidemia -continue medications, check lipids, decrease fatty foods, increase activity. - Lipid panel  9. Bipolar 2 disorder, major depressive episode ? Biopolar, + pressured speech but no pure manic episodes, strongly encouraged to go to psych for complete evaluation. No SI/HI  10. Restless leg syndrome Continue klonopin - Magnesium  11. Cancer Bladder cancer, continue follow up with Dr. Junious Silk.   12. Estrogen deficiency - DG Bone Density; Future  13. Obesity Obesity with co morbidities- long discussion about weight loss, diet, and exercise  14. Vitamin D deficiency - Vit D  25 hydroxy (rtn osteoporosis monitoring)  15. Thoracic aorta atherosclerosis Control blood pressure, cholesterol, glucose, increase exercise.   16. ADD (attention deficit disorder) - amphetamine-dextroamphetamine (ADDERALL) 20 MG tablet; Take 1 tablet (20 mg total) by mouth 2 (two) times daily.  Dispense: 60 tablet; Refill: 0  17. Depression/Bipolar Depression- continue medications for now, continue meds and start on depakote for possible bipolar tendency STRONGLY suggested patient to follow up with psych, she has OCD/anxiety/bipolar, will call ASAP, close follow up here.  No SI/HI   HPI 55 y.o. female  presents for a complete physical. Her blood pressure has been controlled at home, today their BP is BP: 120/60 mmHg She does not workout. She denies chest pain, shortness of breath, dizziness.  She is not on cholesterol medication and denies myalgias. Her cholesterol is at goal. The cholesterol last visit was:  Lab Results  Component Value Date   CHOL 181  05/10/2015   HDL 51 05/10/2015   LDLCALC 114 05/10/2015   TRIG 80 05/10/2015   CHOLHDL  3.5 05/10/2015  She has not been working on diet and exercise for prediabetes, and denies hyperglycemia, nausea, polydipsia and polyuria. Last A1C in the office was:  Lab Results  Component Value Date   HGBA1C 5.9* 05/10/2015  Patient is on Vitamin D supplement.   Lab Results  Component Value Date   VD25OH 42 05/10/2015  She is on thyroid medication. Her medication was not changed last visit. Patient denies diarrhea, heat / cold intolerance and palpitations.  Lab Results  Component Value Date   TSH 2.616 05/10/2015  She has fibromyalgia and RLS with general paresthesias, has followed with Dr. Posey Pronto in neuro as well- she states the klonopin helps her sleep and helps her mood, her adderall helps with energy/brain fog, the lyrica has helped her pain. She is on adderall for ADD/depression which helps.  She has bipolar/depression/OCD tendencies- she states she cries a lot and has a lot of anger issues- feels that she would like treatment, has had diagnosis of bipolar, only on klonopin for treatment. She is on klonopin and lyrica for fibromyalgia and anxiety. Denies SI/HI.  She had bladder tumor resection and stent placement with Dr. Junious Silk in 2015, also had negative genetic testing with her Ob/GYN.   History of 30 pack year smoking history (Q 2011). Has history of COPD from smoking, has been worse with recent allergies with wheezing, coughing. Denies fever or chills.  She had recent right shoulder surgery 07/2015 and most recent anterior neck decompression on 08/02/2015 with Dr. Lynann Bologna. She is having some trouble swallowing, will follow up Wednesday.  She did follow up with Dr. Trudie Reed but had a negtiave RA work up. She is working on disability and is not on working at this time.  BMI is Body mass index is 31.78 kg/(m^2)., she is working on diet and exercise. Wt Readings from Last 3 Encounters:  08/14/15 191 lb (86.637 kg)  08/02/15 184 lb (83.462 kg)  07/12/15 195 lb (88.451 kg)     Current  Medications:  Current Outpatient Prescriptions on File Prior to Visit  Medication Sig Dispense Refill  . albuterol (VENTOLIN HFA) 108 (90 BASE) MCG/ACT inhaler Inhale 2 puffs into the lungs every 4 (four) hours as needed for wheezing or shortness of breath. 1 Inhaler 3  . cholecalciferol (VITAMIN D) 1000 UNITS tablet Take 7,000 Units by mouth at bedtime.     Marland Kitchen doxycycline (VIBRA-TABS) 100 MG tablet Take 1 tablet (100 mg total) by mouth every 12 (twelve) hours. 10 tablet 0  . escitalopram (LEXAPRO) 20 MG tablet Take 1 tablet (20 mg total) by mouth daily. 90 tablet 1  . furosemide (LASIX) 20 MG tablet Take 20-40 mg by mouth daily as needed for fluid.    Marland Kitchen gabapentin (NEURONTIN) 600 MG tablet Take 1/2 to 1 tablet 3 to 4 x day for neuritis pains (Patient taking differently: Take 300-600 mg by mouth 4 (four) times daily as needed (neuritis pains). ) 120 tablet 5  . levothyroxine (SYNTHROID, LEVOTHROID) 75 MCG tablet Take 1 tablet (75 mcg total) by mouth daily before breakfast. 90 tablet 1  . MAGNESIUM PO Take 450 mg by mouth daily.    Marland Kitchen tiZANidine (ZANAFLEX) 2 MG tablet Take 1 tablet (2 mg total) by mouth every 8 (eight) hours as needed for muscle spasms. 40 tablet 0   No current facility-administered medications on file prior to visit.  Health Maintenance:   Immunization History  Administered Date(s) Administered  . Influenza Split 01/14/2014, 01/13/2015  . Pneumococcal Polysaccharide-23 09/02/2013  . Tdap 06/19/2012   Tetanus: 2015 Pneumovax: 2015 Prevnar 13: Flu vaccine: 01/2015 Zostavax: N/A  Pap: 07/2014  MGM: 02/2014, STATES HAD ONE LAST YEAR, DUE THIS YEAR DEXA: N/A- broke sacrum while falling, last menses 2006, former smoker.  Colonoscopy: 06/2011 7 polyps Dr. Deatra Ina EGD: N/A US Pelvis 07/2014 CXR 07/2015 Ct AB pelvis 06/2015 VAS [place] lower legs 06/2015  Patient Care Team: Unk Pinto, MD as PCP - General (Internal Medicine) Ricke Hey, MD as Attending Physician  (Internal Medicine) Festus Aloe, MD as Consulting Physician (Urology) Gavin Pound, MD as Consulting Physician (Rheumatology) Inda Castle, MD as Consulting Physician (Gastroenterology)   Allergies:  Allergies  Allergen Reactions  . Sulfa Antibiotics Hives and Itching   Medical History:  Past Medical History  Diagnosis Date  . Fibromyalgia   . Hyperlipidemia   . COPD (chronic obstructive pulmonary disease) ([place])   . Prediabetes   . Restless leg syndrome   . Hypertension     normal now, only took medications for 1 month- thinks it was from pain.  Marland Kitchen Hypothyroidism   . Hemorrhoid   . Anxiety   . Depression   . ADD (attention deficit disorder)   . Bladder neoplasm   . History of melanoma excision     leg  . Iron deficiency anemia   . Cancer [facility]) 2015    Bladder cancer  . Shortness of breath dyspnea     with exertion  . H/O bronchitis     treated within the last week w steroids, antibx tessalon pearls.   . SUI (stress urinary incontinence, female)   . Fibroids   . Wears glasses   . H/O sebaceous cyst 07/18/14    left breast   . Shoulder impingement     right  . PONV (postoperative nausea and vomiting)     used a scop patch last surg-did well   Surgical History:  Past Surgical History  Procedure Laterality Date  . Ankle fusion Right 2011  . Knee arthroscopy with medial menisectomy Right 05/22/2012    Procedure: KNEE ARTHROSCOPY WITH MEDIAL MENISECTOMY and Medial Plica excision;  Surgeon: Alta Corning, MD;  Location: [place];  Service: Orthopedics;  Laterality: Right;  . Anterior cervical decomp/discectomy fusion N/A 09/01/2013    Procedure: Anterior cervical decompression fusion, cervical 5-6 with instrumentation and allograft;  Surgeon: Sinclair Ship, MD;  Location: [place];  Service: Orthopedics;  Laterality: N/A;  Anterior cervical decompression fusion, cervical 5-6 with instrumentation and allograft  . Shoulder arthroscopy with  subacromial decompression Left 11/10/2013    Procedure: SHOULDER ARTHROSCOPY WITH SUBACROMIAL DECOMPRESSION WITH ROTATOR CUFF REPAIR AND BICEPS TENODESIS;  Surgeon: Alta Corning, MD;  Location: [place];  Service: Orthopedics;  Laterality: Left;  . Closed reduction and pinning left fifth  proximal phalangeal fx  10-09-2005  . D & c hysteroscopy with novasure endometrial ablation  07-16-2006  . Shoulder arthroscopy w/ subacromial decompression and distal clavicle excision Left 11-27-2011    W/  DEBRIDEMENT  . Cataract extraction w/ intraocular lens implant Bilateral   . Tonsillectomy  age 65  . Laparoscopic cholecystectomy  2005  . Tubal ligation  1980  . Transurethral resection of bladder tumor N/A 01/11/2014    Procedure: TRANSURETHRAL RESECTION OF BLADDER TUMOR (TURBT);  Surgeon: Festus Aloe, MD;  Location: [facility];  Service: Urology;  Laterality: N/A;  . Cystoscopy w/ retrogrades Left 01/11/2014    Procedure: CYSTOSCOPY WITH RETROGRADE PYELOGRAM;  Surgeon: Festus Aloe, MD;  Location: [facility];  Service: Urology;  Laterality: Left;  . Cystoscopy with stent placement Left 01/11/2014    Procedure: CYSTOSCOPY WITH STENT PLACEMENT;  Surgeon: Festus Aloe, MD;  Location: [facility];  Service: Urology;  Laterality: Left;  . Laparoscopic assisted vaginal hysterectomy N/A 08/29/2014    Procedure: LAPAROSCOPIC ASSISTED VAGINAL HYSTERECTOMY;  Surgeon: Molli Posey, MD;  Location: [facility];  Service: Gynecology;  Laterality: N/A;  . Salpingoophorectomy Bilateral 08/29/2014    Procedure: SALPINGO OOPHORECTOMY;  Surgeon: Molli Posey, MD;  Location: [facility];  Service: Gynecology;  Laterality: Bilateral;  . Pubovaginal sling N/A 08/29/2014    Procedure: Okey Dupre SINGLE INCISION SLING;  Surgeon: Molli Posey, MD;  Location: [facility];  Service: Gynecology;  Laterality: N/A;   . Laparotomy N/A 08/29/2014    Procedure: EXPLORATORY LAPAROTOMY WITH LIGATION OF BLEEDER;  Surgeon: Molli Posey, MD;  Location: [facility];  Service: Gynecology;  Laterality: N/A;  . Shoulder arthroscopy with subacromial decompression Right 12/21/2014    Procedure: SHOULDER ARTHROSCOPY WITH SUBACROMIAL DECOMPRESSION;  Surgeon: Dorna Leitz, MD;  Location: [place];  Service: Orthopedics;  Laterality: Right;  . Shoulder arthroscopy with distal clavicle resection Right 12/21/2014    Procedure: SHOULDER ARTHROSCOPY WITH DISTAL CLAVICLE RESECTION;  Surgeon: Dorna Leitz, MD;  Location: [place];  Service: Orthopedics;  Laterality: Right;  . Shoulder arthroscopy with bicepstenotomy Right 12/21/2014    Procedure: SHOULDER ARTHROSCOPY WITH BICEPSTENOTOMY;  Surgeon: Dorna Leitz, MD;  Location: [place];  Service: Orthopedics;  Laterality: Right;  . Abdominal hysterectomy    . Posterior cervical fusion/foraminotomy N/A 01/25/2015    Procedure: POSTERIOR CERVICAL DECOMPRESSSION CERVICAL 7-THORACIC 1;  Surgeon: Phylliss Bob, MD;  Location: [place];  Service: Orthopedics;  Laterality: N/A;  Posterior cervical decompression, cervical 5-6, cervical 6-7, cervical 7-thoracic 1   . Shoulder arthroscopy with open rotator cuff repair Right 07/12/2015    Procedure: SHOULDER ARTHROSCOPY WITH ACROMIOPLASTY AND OPEN ROTATOR CUFF REPAIR;  Surgeon: Dorna Leitz, MD;  Location: [place];  Service: Orthopedics;  Laterality: Right;  . Anterior cervical decomp/discectomy fusion N/A 08/02/2015    Procedure: ANTERIOR CERVICAL DECOMPRESSION FUSION CERVICAL 6 - THORACIC 1 WITH INSTRUMENTATION AND ALLOGRAFT;  Surgeon: Phylliss Bob, MD;  Location: [place];  Service: Orthopedics;  Laterality: N/A;  ANTERIOR CERVICAL DECOMPRESSION FUSION, CERVICAL 6-7,CERVICAL 7-THORACIC 7 WITH INSTRUMENTATION AND ALLOGRAFT   Family History:  Family History  Problem Relation Age of Onset  .  Cancer Father     Lung and stomach   . Heart disease Father   . Hypertension Father   . Stroke Father   . Heart failure Paternal Grandfather   . Cancer Paternal Grandfather     colon cancer  . Mental retardation Son   . Ovarian cancer Mother 85  . Breast cancer Mother     dx in her late 71s-50s  . Thyroid cancer Mother   . Ovarian cancer Sister 57  . Cancer Brother     abdominal tumor  . Ovarian cancer Maternal Aunt   . Breast cancer Maternal Aunt   . Breast cancer Maternal Uncle   . Breast cancer Paternal Aunt   . Ovarian cancer Sister 53  . Ovarian cancer Sister 6  . Cancer Brother     abdominal tumor  .  Breast cancer Paternal Aunt   . Breast cancer Paternal Aunt   . Breast cancer Paternal Aunt   . Breast cancer Cousin   . Breast cancer Cousin    Social History:  Social History  Substance Use Topics  . Smoking status: Former Smoker -- 1.00 packs/day for 30 years    Types: Cigarettes    Quit date: 06/24/2009  . Smokeless tobacco: Never Used  . Alcohol Use: No  Review of Systems  Constitutional: Negative.   HENT: Negative for congestion, ear discharge, ear pain, hearing loss, nosebleeds, sore throat and tinnitus.   Eyes: Negative.   Respiratory: Negative for cough, hemoptysis, sputum production, shortness of breath, wheezing and stridor.   Cardiovascular: Negative for chest pain, palpitations, orthopnea, claudication, leg swelling and PND.  Gastrointestinal: Negative for heartburn, nausea, vomiting, abdominal pain, diarrhea, constipation, blood in stool and melena.  Genitourinary: Negative.   Musculoskeletal: Positive for myalgias, back pain, joint pain and neck pain. Negative for falls.  Skin: Negative.   Neurological: Negative.  Negative for headaches.  Endo/Heme/Allergies: Negative.   Psychiatric/Behavioral: Positive for depression and memory loss. Negative for suicidal ideas, hallucinations and substance abuse. The patient is nervous/anxious. The patient does  not have insomnia.    Physical Exam: Estimated body mass index is 31.78 kg/(m^2) as calculated from the following:   Height as of this encounter: 5\' 5"  (1.651 m).   Weight as of this encounter: 191 lb (86.637 kg). BP 120/60 mmHg  Pulse 72  Temp(Src) 97.2 F (36.2 C) (Temporal)  Resp 16  Ht 5\' 5"  (1.651 m)  Wt 191 lb (86.637 kg)  BMI 31.78 kg/m2  SpO2 99% General Appearance: Well nourished, in no apparent distress. Eyes: PERRLA, EOMs, conjunctiva no swelling or erythema, normal fundi and vessels. Sinuses: No Frontal/maxillary tenderness ENT/Mouth: Ext aud canals clear, normal light reflex with TMs without erythema, bulging.  Good dentition. No erythema, swelling, or exudate on post pharynx. Tonsils not swollen or erythematous. Hearing normal.  Neck: Supple, thyroid normal. No bruits Respiratory: Respiratory effort normal, BS equal bilaterally, course expiratory breath sounds without rales, rhonchi, wheezing or stridor. RA O2 98% Cardio: RRR without murmurs, rubs or gallops. Brisk peripheral pulses.  Chest: symmetric, with normal excursions and percussion. Breasts: defer Abdomen: Soft, obese, +BS. Non tender, no guarding, rebound, hernias, masses, or organomegaly. .  Lymphatics: Non tender without lymphadenopathy.  Genitourinary: defer Musculoskeletal: Full ROM all peripheral extremities 4/5 strength, and antalgic gait with neck in brace and right arm in sling, good distal neurovascular in arm/legs.  Left ankle with mild swelling, sensation intact.  Skin: Warm, dry without rashes, lesions, ecchymosis.  Neuro: Cranial nerves intact, reflexes equal bilaterally. Normal muscle tone, no cerebellar symptoms.  Psych: Awake and oriented X 3, normal affect, Insight and Judgment appropriate.   EKG: defer, had EKG prior to surgery 04/272017, EKG reviewed   Vicie Mutters 10:22 AM

## 2015-08-15 LAB — URINALYSIS, ROUTINE W REFLEX MICROSCOPIC
Bilirubin Urine: NEGATIVE
Glucose, UA: NEGATIVE
Hgb urine dipstick: NEGATIVE
Ketones, ur: NEGATIVE
Leukocytes, UA: NEGATIVE
Nitrite: NEGATIVE
Protein, ur: NEGATIVE
Specific Gravity, Urine: 1.015 (ref 1.001–1.035)
pH: 7.5 (ref 5.0–8.0)

## 2015-08-15 LAB — VITAMIN D 25 HYDROXY (VIT D DEFICIENCY, FRACTURES): Vit D, 25-Hydroxy: 81 ng/mL (ref 30–100)

## 2015-08-15 LAB — MICROALBUMIN / CREATININE URINE RATIO
Creatinine, Urine: 36 mg/dL (ref 20–320)
Microalb, Ur: <0.2 mg/dL

## 2015-08-29 NOTE — Discharge Summary (Signed)
Patient ID: [name] MRN: [id number] DOB/AGE: February 08, 1961 55 y.o.  Admit date: 08/02/2015 Discharge date: 08/03/2015  Admission Diagnoses:  Active Problems:   Fibromyalgia   COPD (chronic obstructive pulmonary disease) (H[facility])   Hypothyroidism   Obesity   Radicular pain   Pleuritic chest pain   Discharge Diagnoses:  Same  Past Medical History  Diagnosis Date  . Fibromyalgia   . Hyperlipidemia   . COPD (chronic obstructive pulmonary disease) ([place])   . Prediabetes   . Restless leg syndrome   . Hypertension     normal now, only took medications for 1 month- thinks it was from pain.  Marland Kitchen Hypothyroidism   . Hemorrhoid   . Anxiety   . Depression   . ADD (attention deficit disorder)   . Bladder neoplasm   . History of melanoma excision     leg  . Iron deficiency anemia   . Cancer [facility]) 2015    Bladder cancer  . Shortness of breath dyspnea     with exertion  . H/O bronchitis     treated within the last week w steroids, antibx tessalon pearls.   . SUI (stress urinary incontinence, female)   . Fibroids   . Wears glasses   . H/O sebaceous cyst 07/18/14    left breast   . Shoulder impingement     right  . PONV (postoperative nausea and vomiting)     used a scop patch last surg-did well    Surgeries: Procedure(s): ANTERIOR CERVICAL DECOMPRESSION FUSION CERVICAL 6 - THORACIC 1 WITH INSTRUMENTATION AND ALLOGRAFT on 08/02/2015   Consultants: None  Discharged Condition: Improved  Hospital Course: [name] is an 55 y.o. female who was admitted 08/02/2015 for operative treatment of radiculopathy. Patient has severe unremitting pain that affects sleep, daily activities, and work/hobbies. After pre-op clearance the patient was taken to the operating room on 08/02/2015 and underwent  Procedure(s): ANTERIOR CERVICAL DECOMPRESSION FUSION CERVICAL 6 - THORACIC 1 WITH INSTRUMENTATION AND ALLOGRAFT.    Patient was given perioperative antibiotics:  Anti-infectives    Start     Dose/Rate Route Frequency Ordered Stop   08/03/15 1000  doxycycline (VIBRA-TABS) tablet 100 mg  Status:  Discontinued     100 mg Oral Every 12 hours 08/03/15 0907 08/04/15 0049   08/03/15 0000  doxycycline (VIBRA-TABS) 100 MG tablet     100 mg Oral Every 12 hours 08/03/15 1801     08/02/15 1545  ceFAZolin (ANCEF) IVPB 1 g/50 mL premix     1 g 100 mL/hr over 30 Minutes Intravenous Every 8 hours 08/02/15 1536 08/02/15 2247       Patient was given sequential compression devices, early ambulation to prevent DVT.  Patient benefited maximally from hospital stay and there were no complications.    Recent vital signs: BP 117/64 mmHg  Pulse 50  Temp(Src) 98.2 F (36.8 C) (Oral)  Resp 18  Ht 5\' 3"  (1.6 m)  Wt 83.462 kg (184 lb)  BMI 32.60 kg/m2  SpO2 97%  Discharge Medications:     Medication List    STOP taking these medications        amphetamine-dextroamphetamine 20 MG tablet  Commonly known as:  ADDERALL     clonazePAM 2 MG tablet  Commonly known as:  KLONOPIN     HYDROcodone-acetaminophen 10-325 MG tablet  Commonly known as:  NORCO     predniSONE 1 MG tablet  Commonly known as:  Reardan these  medications        albuterol 108 (90 Base) MCG/ACT inhaler  Commonly known as:  VENTOLIN HFA  Inhale 2 puffs into the lungs every 4 (four) hours as needed for wheezing or shortness of breath.     cholecalciferol 1000 units tablet  Commonly known as:  VITAMIN D  Take 7,000 Units by mouth at bedtime.     doxycycline 100 MG tablet  Commonly known as:  VIBRA-TABS  Take 1 tablet (100 mg total) by mouth every 12 (twelve) hours.     escitalopram 20 MG tablet  Commonly known as:  LEXAPRO  Take 1 tablet (20 mg total) by mouth daily.     furosemide 20 MG tablet  Commonly known as:  LASIX  Take 20-40 mg by mouth daily as needed for fluid.     gabapentin 600 MG tablet  Commonly known as:  NEURONTIN  Take 1/2 to 1 tablet 3 to 4 x day for neuritis pains       levothyroxine 75 MCG tablet  Commonly known as:  SYNTHROID, LEVOTHROID  Take 1 tablet (75 mcg total) by mouth daily before breakfast.     MAGNESIUM PO  Take 450 mg by mouth daily.     tiZANidine 2 MG tablet  Commonly known as:  ZANAFLEX  Take 1 tablet (2 mg total) by mouth every 8 (eight) hours as needed for muscle spasms.        Diagnostic Studies: Dg Chest 2 View  08/03/2015  CLINICAL DATA:  Chest tightness EXAM: CHEST  2 VIEW COMPARISON:  12/21/2014 FINDINGS: Mild patchy left lower lobe/lingular opacity, atelectasis versus pneumonia. Azygos fissure. No pleural effusion or pneumothorax. Heart is normal in size. Cervical spine fixation hardware. Visualized osseous structures are otherwise within normal limits. IMPRESSION: Mild patchy left lower lobe/ lingular opacity, atelectasis versus pneumonia. Electronically Signed   By: Julian Hy M.D.   On: 08/03/2015 09:01   Dg Cervical Spine 1 View  08/02/2015  CLINICAL DATA: Cervical spine surgery. EXAM: DG C-ARM 61-120 MIN; DG CERVICAL SPINE - 1 VIEW COMPARISON:  CT 06/21/2015. FINDINGS: Lower cervical spine fusion. Cervical spine numbering is difficult due to positioning and intraoperative technique. 0 minutes 6 seconds fluoroscopy time. Three images obtained. IMPRESSION: Lower cervical spine fusion. Electronically Signed   By: Marcello Moores  Register   On: 08/02/2015 12:21   Dg C-arm 1-60 Min  08/02/2015  CLINICAL DATA: Cervical spine surgery. EXAM: DG C-ARM 61-120 MIN; DG CERVICAL SPINE - 1 VIEW COMPARISON:  CT 06/21/2015. FINDINGS: Lower cervical spine fusion. Cervical spine numbering is difficult due to positioning and intraoperative technique. 0 minutes 6 seconds fluoroscopy time. Three images obtained. IMPRESSION: Lower cervical spine fusion. Electronically Signed   By: Marcello Moores  Register   On: 08/02/2015 12:21    Disposition: 01-Home or Self Care   POD #1 s/p C5-7 ACDF doing well from the standpoint of her surgery.   -Written scripts  for pain signed and in chart -D/C instructions sheet printed and in chart -D/C today  -F/U in office 2 weeks   Signed: Justice Britain 08/29/2015, 9:17 PM

## 2015-09-08 ENCOUNTER — Other Ambulatory Visit: Payer: Self-pay

## 2015-09-08 ENCOUNTER — Encounter: Payer: Self-pay | Admitting: Physician Assistant

## 2015-09-08 ENCOUNTER — Ambulatory Visit (INDEPENDENT_AMBULATORY_CARE_PROVIDER_SITE_OTHER): Payer: BLUE CROSS/BLUE SHIELD | Admitting: Physician Assistant

## 2015-09-08 VITALS — BP 132/80 | HR 77 | Temp 97.0°F | Resp 16 | Ht 65.0 in | Wt 191.6 lb

## 2015-09-08 DIAGNOSIS — F3181 Bipolar II disorder: Secondary | ICD-10-CM

## 2015-09-08 DIAGNOSIS — F909 Attention-deficit hyperactivity disorder, unspecified type: Secondary | ICD-10-CM

## 2015-09-08 DIAGNOSIS — F329 Major depressive disorder, single episode, unspecified: Secondary | ICD-10-CM

## 2015-09-08 DIAGNOSIS — F32A Depression, unspecified: Secondary | ICD-10-CM

## 2015-09-08 DIAGNOSIS — M797 Fibromyalgia: Secondary | ICD-10-CM | POA: Diagnosis not present

## 2015-09-08 DIAGNOSIS — J449 Chronic obstructive pulmonary disease, unspecified: Secondary | ICD-10-CM

## 2015-09-08 DIAGNOSIS — E079 Disorder of thyroid, unspecified: Secondary | ICD-10-CM

## 2015-09-08 DIAGNOSIS — F988 Other specified behavioral and emotional disorders with onset usually occurring in childhood and adolescence: Secondary | ICD-10-CM

## 2015-09-08 MED ORDER — AMPHETAMINE-DEXTROAMPHETAMINE 20 MG PO TABS: ORAL_TABLET | ORAL | Status: DC

## 2015-09-08 MED ORDER — LEVOTHYROXINE SODIUM 75 MCG PO TABS
75.0000 ug | ORAL_TABLET | Freq: Every day | ORAL | Status: DC
Start: 2015-09-08 — End: 2016-03-12

## 2015-09-08 MED ORDER — FUROSEMIDE 20 MG PO TABS: 20.0000 mg | ORAL_TABLET | Freq: Every day | ORAL | Status: DC | PRN

## 2015-09-08 MED ORDER — ALBUTEROL SULFATE HFA 108 (90 BASE) MCG/ACT IN AERS: 2.0000 | INHALATION_SPRAY | RESPIRATORY_TRACT | Status: DC | PRN

## 2015-09-08 MED ORDER — VORTIOXETINE HBR 20 MG PO TABS: ORAL_TABLET | ORAL | Status: DC

## 2015-09-08 NOTE — Patient Instructions (Addendum)
will try to switch the lexapro to trintellix, continue depakote at night only for this time.  Start on 5mg  of the trintellix for 3-5 days, and then increase to 10mg  daily, and can go up to 20mg .   Given names of psychiatrist to call and make an appointment or can call insurance to find out who is in network.   PLEASE CALL  If you have any thoughts of harming yourself or someone else please call 911.

## 2015-09-08 NOTE — Progress Notes (Signed)
Assessment and Plan: FM/depression/anxiety/questionable biopolar/OCD- will try to switch the lexapro to trintellix, continue depakote at night only for this time. Given names of psychiatrist to call and make an appointment or can call insurance to find out who is in network.    HPI 55 y.o.female with history of FM, depression, RLS, OCD, questionable bipolar tendencies presents for follow up for medication changes. Last visit she was encouraged to follow up with psych, however she was started on depakote as a mood stabilizer. She got approved for disability. She is still in considerable pain from her previous neck surgery.   She states that she has been more fatigued recently and does not think that the depakote is helping with anxiety/mood. She has more depression, does not want to leave the house, has mood swings with increased anger. She is on lexapro but still having depression/anxiety symptoms. She denies Si/HI. Made an appointment for a counselor appointment but did not go.     Past Medical History  Diagnosis Date  . Fibromyalgia   . Hyperlipidemia   . COPD (chronic obstructive pulmonary disease) ([place])   . Prediabetes   . Restless leg syndrome   . Hypertension     normal now, only took medications for 1 month- thinks it was from pain.  Marland Kitchen Hypothyroidism   . Hemorrhoid   . Anxiety   . Depression   . ADD (attention deficit disorder)   . Bladder neoplasm   . History of melanoma excision     leg  . Iron deficiency anemia   . Cancer [facility]) 2015    Bladder cancer  . Shortness of breath dyspnea     with exertion  . H/O bronchitis     treated within the last week w steroids, antibx tessalon pearls.   . SUI (stress urinary incontinence, female)   . Fibroids   . Wears glasses   . H/O sebaceous cyst 07/18/14    left breast   . Shoulder impingement     right  . PONV (postoperative nausea and vomiting)     used a scop patch last surg-did well     Allergies  Allergen Reactions  .  Sulfa Antibiotics Hives and Itching      Current Outpatient Prescriptions on File Prior to Visit  Medication Sig Dispense Refill  . albuterol (VENTOLIN HFA) 108 (90 BASE) MCG/ACT inhaler Inhale 2 puffs into the lungs every 4 (four) hours as needed for wheezing or shortness of breath. 1 Inhaler 3  . amphetamine-dextroamphetamine (ADDERALL) 20 MG tablet Take 1/2 to 1 tablet 1 or 2 x day only if needed for ADD 60 tablet 0  . cholecalciferol (VITAMIN D) 1000 UNITS tablet Take 7,000 Units by mouth at bedtime.     . clonazePAM (KLONOPIN) 2 MG tablet Take 1 tablet (2 mg total) by mouth 2 (two) times daily. 60 tablet 1  . divalproex (DEPAKOTE) 250 MG DR tablet Take 1 tablet (250 mg total) by mouth 3 (three) times daily. 90 tablet 0  . doxycycline (VIBRA-TABS) 100 MG tablet Take 1 tablet (100 mg total) by mouth every 12 (twelve) hours. 10 tablet 0  . escitalopram (LEXAPRO) 20 MG tablet Take 1 tablet (20 mg total) by mouth daily. 90 tablet 1  . furosemide (LASIX) 20 MG tablet Take 20-40 mg by mouth daily as needed for fluid.    Marland Kitchen gabapentin (NEURONTIN) 600 MG tablet Take 1/2 to 1 tablet 3 to 4 x day for neuritis pains (Patient taking differently: Take 300-600  mg by mouth 4 (four) times daily as needed (neuritis pains). ) 120 tablet 5  . levothyroxine (SYNTHROID, LEVOTHROID) 75 MCG tablet Take 1 tablet (75 mcg total) by mouth daily before breakfast. 90 tablet 1  . MAGNESIUM PO Take 450 mg by mouth daily.    Marland Kitchen oxyCODONE-acetaminophen (PERCOCET/ROXICET) 5-325 MG tablet   0  . tiZANidine (ZANAFLEX) 2 MG tablet Take 1 tablet (2 mg total) by mouth every 8 (eight) hours as needed for muscle spasms. 40 tablet 0   No current facility-administered medications on file prior to visit.    ROS: all negative except above.   Physical Exam: Filed Weights   09/08/15 0949  Weight: 191 lb 9.6 oz (86.909 kg)   Ht 5\' 5"  (1.651 m) General Appearance: Well nourished, in no apparent distress. Eyes: PERRLA, EOMs,  conjunctiva no swelling or erythema Sinuses: No Frontal/maxillary tenderness ENT/Mouth: Ext aud canals clear, TMs without erythema, bulging. No erythema, swelling, or exudate on post pharynx.  Tonsils not swollen or erythematous. Hearing normal.  Neck: Supple, thyroid normal.  Respiratory: Respiratory effort normal, BS equal bilaterally without rales, rhonchi, wheezing or stridor.  Cardio: RRR with no MRGs. Brisk peripheral pulses without edema.  Abdomen: Soft, + BS.  Non tender, no guarding, rebound, hernias, masses. Lymphatics: Non tender without lymphadenopathy.  Musculoskeletal: Full ROM, 5/5 strength, normal gait, decreased range of motion of neck due to recent surgery. .  Skin: Warm, dry without rashes, lesions, ecchymosis.  Neuro: Cranial nerves intact. Normal muscle tone, no cerebellar symptoms. Sensation intact.  Psych: Awake and oriented X 3, normal affect, Insight and Judgment appropriate, restless.     Vicie Mutters, PA-C 10:02 AM [facility]

## 2015-09-22 ENCOUNTER — Encounter: Payer: Self-pay | Admitting: Internal Medicine

## 2015-09-22 ENCOUNTER — Ambulatory Visit (INDEPENDENT_AMBULATORY_CARE_PROVIDER_SITE_OTHER): Payer: BLUE CROSS/BLUE SHIELD | Admitting: Internal Medicine

## 2015-09-22 VITALS — BP 112/80 | HR 80 | Temp 97.3°F | Resp 16 | Ht 65.0 in | Wt 192.5 lb

## 2015-09-22 DIAGNOSIS — L723 Sebaceous cyst: Secondary | ICD-10-CM | POA: Diagnosis not present

## 2015-09-22 DIAGNOSIS — I10 Essential (primary) hypertension: Secondary | ICD-10-CM | POA: Diagnosis not present

## 2015-09-22 NOTE — Progress Notes (Signed)
[place] [facility]   Unk Pinto, M.D.    Uvaldo Bristle. Silverio Lay, P.A.-C      Starlyn Skeans, P.A.-C   [facility]                [address] [place], [place] [id number] Telephone [phone] Telefax [phone] _________________________________ Subjective:    Patient ID: [name], female    DOB: 07-15-1960, 55 y.o.   MRN: [id number]  HPI   This very nice nice 55 yo MWF w/ Labile HTN presents for recheck and also with c/o some tender lumps of her anterior neck .   Medication Sig  . albuterol HFA inhaler Inhale 2 puffs  every 4  hours as needed   . ADDERALL 20 MG tablet Take 1/2 to 1 tablet 1 or 2 x day only if needed for ADD  . Vitamin  D 5,000 & 2,000 Take 7,000 Units  at bedtime.   . clonazePAM  2 MG  Take 1 tab 2  times daily.  . divalproex  250 MG DR  Take 1 tab  3  times daily.  Marland Kitchen escitalopram 20 MG tablet Take 1 tab daily.  . furosemide  20 MG tablet Take 1-2 tab daily as needed for fluid.  Marland Kitchen gabapentin \ 600 MG tablet Take 1/2 to 1 tablet 3 to 4 x day for neuritis pains \  . NORCO 5-325 MG tablet   . levothyroxine  75 MCG tablet Take 1 tablet (75 mcg total) by mouth daily before breakfast.  . MAGNESIUM PO Take 450 mg by mouth daily.  . methocarbamol 500 MG tablet   . tiZANidine2 MG tablet Take 1 tablet (2 mg total) by mouth every 8 (eight) hours as needed  . vortioxetine HBr ( 20 MG TABS 1 tablet daily  . PERCOCET-325 MG tablet    Allergies  Allergen Reactions  . Sulfa Antibiotics Hives and Itching   Past Medical History  Diagnosis Date  . Fibromyalgia   . Hyperlipidemia   . COPD (chronic obstructive pulmonary disease) ([place])   . Prediabetes   . Restless leg syndrome   . Hypertension     normal now, only took medications for 1 month- thinks it was from pain.  Marland Kitchen Hypothyroidism   . Hemorrhoid   . Anxiety   . Depression   . ADD (attention deficit disorder)   . Bladder neoplasm    . History of melanoma excision     leg  . Iron deficiency anemia   . Cancer [facility]) 2015    Bladder cancer  . Shortness of breath dyspnea     with exertion  . H/O bronchitis     treated within the last week w steroids, antibx tessalon pearls.   . SUI (stress urinary incontinence, female)   . Fibroids   . Wears glasses   . H/O sebaceous cyst 07/18/14    left breast   . Shoulder impingement     right  . PONV (postoperative nausea and vomiting)     used a scop patch last surg-did well   Review of Systems  10 point systems review negative except as above.    Objective:   Physical Exam  BP 112/80 mmHg  Pulse 80  Temp(Src) 97.3 F (36.3 C)  Resp 16  Ht 5\' 5"  (1.651 m)  Wt 192 lb 8 oz (87.317 kg)  BMI 32.03  kg/m2  HEENT - Eac's patent. TM's Nl. EOM's full. PERRLA. NasoOroPharynx clear. Neck - supple. Nl Thyroid. Carotids 2+ & No bruits, nodes, JVD Chest - Clear equal BS w/o Rales, rhonchi, wheezes. Cor - Nl HS. RRR w/o sig MGR. PP 1(+). No edema. Abd - No palpable organomegaly, masses or tenderness. BS nl. MS- FROM w/o deformities. Muscle power, tone and bulk Nl. Gait Nl. Neuro - No obvious Cr N abnormalities. Sensory, motor and Cerebellar functions appear Nl w/o focal abnormalities. Psyche - Mental status normal & appropriate.  No delusions, ideations or obvious mood abnormalities. Skin - There are # 3 tender sub-cutaneous 5 x 5 mm cystic lesions in the anterior neck in the L mid lateral neck and a #2 lesions in the mid neck above the thyroid.  Procedure (CPT - 11420) : After informed consent and aseptic prep with alcohol , the 3 tender cystic areas of the anterior neck were anesthetized locally with 1.5 ML (total) of Marcaine 0.5% w/epi and then with a #10 scalpel angular transverse incisions ~ 5 mm each were made in alignment with the anatomic skin rhytids. Then with traction the #3 cystic structures were sharply dissected  and delivered free. Then each of the # 3 wound sites  were individually closed with mattress sutures with Nylon 4-0 to approximate and evert the skin edges and sterile dressings were applied. patient was instructed care and asked to return in 5-6 days for suture removal or sooner if problems.       Assessment & Plan:   1. Essential hypertension   2. Inflamed sebaceous cyst of neck  x 3

## 2015-09-26 ENCOUNTER — Other Ambulatory Visit: Payer: Self-pay | Admitting: Internal Medicine

## 2015-09-26 ENCOUNTER — Encounter: Payer: Self-pay | Admitting: Internal Medicine

## 2015-09-27 ENCOUNTER — Ambulatory Visit (INDEPENDENT_AMBULATORY_CARE_PROVIDER_SITE_OTHER): Payer: Self-pay | Admitting: Internal Medicine

## 2015-09-27 DIAGNOSIS — R69 Illness, unspecified: Secondary | ICD-10-CM

## 2015-09-27 NOTE — Progress Notes (Signed)
Patient ID: [name], female   DOB: 22-Jun-1960, 55 y.o.   MRN: [id number] [name]

## 2015-10-04 ENCOUNTER — Ambulatory Visit: Payer: Self-pay | Admitting: Physician Assistant

## 2015-10-05 ENCOUNTER — Ambulatory Visit (INDEPENDENT_AMBULATORY_CARE_PROVIDER_SITE_OTHER): Payer: BLUE CROSS/BLUE SHIELD | Admitting: Physician Assistant

## 2015-10-05 ENCOUNTER — Encounter: Payer: Self-pay | Admitting: Physician Assistant

## 2015-10-05 VITALS — BP 118/90 | HR 82 | Temp 98.2°F | Resp 16 | Ht 65.0 in | Wt 196.2 lb

## 2015-10-05 DIAGNOSIS — F3181 Bipolar II disorder: Secondary | ICD-10-CM | POA: Diagnosis not present

## 2015-10-05 MED ORDER — ESCITALOPRAM OXALATE 20 MG PO TABS: 20.0000 mg | ORAL_TABLET | Freq: Every day | ORAL | Status: DC

## 2015-10-05 MED ORDER — BUPROPION HCL ER (XL) 150 MG PO TB24: 150.0000 mg | ORAL_TABLET | ORAL | Status: DC

## 2015-10-05 NOTE — Progress Notes (Signed)
Assessment and Plan: Bipolar depression complicated by chronic pain- get back on lexapro 20mg , continue klonopin, add wellbutrin, not doing well with depakote due to fatigue, will stop for now- ? Benefit from tegretol or abilify, denies SI/HI. We had a very long discussion that at this time she is beyond my scope of practice and needs to go to behavorial health if she needs immediate treatment and she needs to go to psychiatrist. She states she will make an appointment.    HPI 55 y.o.female presents for 1 month follow up for medication change. Patient has history of FM, depression, anxiety, and questionable bipolar diagnosis. She is on depakote at night, and was swtiched from lexapro to trintellix. She is still on the trintellix, she states that it is not working for her. She states that her depression and OCD has worsened since starting the medications. She is crying very easily, all day. She has chronic pain. Denies SI/HI  She states she is feeling overwhelmed with pain, she has canceled all of her appointment for the week for PT due to chronic pain and felt over whelmed with pain and depression.   Past Medical History  Diagnosis Date  . Fibromyalgia   . Hyperlipidemia   . COPD (chronic obstructive pulmonary disease) ([place])   . Prediabetes   . Restless leg syndrome   . Hypertension     normal now, only took medications for 1 month- thinks it was from pain.  Marland Kitchen Hypothyroidism   . Hemorrhoid   . Anxiety   . Depression   . ADD (attention deficit disorder)   . Bladder neoplasm   . History of melanoma excision     leg  . Iron deficiency anemia   . Cancer [facility]) 2015    Bladder cancer  . Shortness of breath dyspnea     with exertion  . H/O bronchitis     treated within the last week w steroids, antibx tessalon pearls.   . SUI (stress urinary incontinence, female)   . Fibroids   . Wears glasses   . H/O sebaceous cyst 07/18/14    left breast   . Shoulder impingement     right  . PONV  (postoperative nausea and vomiting)     used a scop patch last surg-did well     Allergies  Allergen Reactions  . Sulfa Antibiotics Hives and Itching      Current Outpatient Prescriptions on File Prior to Visit  Medication Sig Dispense Refill  . albuterol (VENTOLIN HFA) 108 (90 Base) MCG/ACT inhaler Inhale 2 puffs into the lungs every 4 (four) hours as needed for wheezing or shortness of breath. 1 Inhaler 3  . amphetamine-dextroamphetamine (ADDERALL) 20 MG tablet Take 1/2 to 1 tablet 1 or 2 x day only if needed for ADD 60 tablet 0  . cholecalciferol (VITAMIN D) 1000 UNITS tablet Take 7,000 Units by mouth at bedtime.     . clonazePAM (KLONOPIN) 2 MG tablet Take 1 tablet (2 mg total) by mouth 2 (two) times daily. 60 tablet 1  . divalproex (DEPAKOTE) 250 MG DR tablet Take 1 tablet (250 mg total) by mouth 3 (three) times daily. 90 tablet 0  . escitalopram (LEXAPRO) 20 MG tablet Take 1 tablet (20 mg total) by mouth daily. 90 tablet 1  . furosemide (LASIX) 20 MG tablet Take 1-2 tablets (20-40 mg total) by mouth daily as needed for fluid. 30 tablet 3  . gabapentin (NEURONTIN) 600 MG tablet Take 1/2 to 1 tablet 3 to  4 x day for neuritis pains (Patient taking differently: Take 300-600 mg by mouth 4 (four) times daily as needed (neuritis pains). ) 120 tablet 5  . levothyroxine (SYNTHROID, LEVOTHROID) 75 MCG tablet Take 1 tablet (75 mcg total) by mouth daily before breakfast. 90 tablet 1  . MAGNESIUM PO Take 450 mg by mouth daily.    . methocarbamol (ROBAXIN) 500 MG tablet   0  . tiZANidine (ZANAFLEX) 2 MG tablet Take 1 tablet (2 mg total) by mouth every 8 (eight) hours as needed for muscle spasms. 40 tablet 0  . vortioxetine HBr (TRINTELLIX) 20 MG TABS 1 tablet daily 30 tablet 3   No current facility-administered medications on file prior to visit.    ROS: all negative except above.   Physical Exam: Filed Weights   10/05/15 1358  Weight: 196 lb 3.2 oz (88.996 kg)   BP 118/90 mmHg  Pulse 82   Temp(Src) 98.2 F (36.8 C) (Temporal)  Resp 16  Ht 5\' 5"  (1.651 m)  Wt 196 lb 3.2 oz (88.996 kg)  BMI 32.65 kg/m2  SpO2 97% General Appearance: Well nourished, in no apparent distress. Eyes: PERRLA, EOMs, conjunctiva no swelling or erythema Sinuses: No Frontal/maxillary tenderness ENT/Mouth: Ext aud canals clear, TMs without erythema, bulging. No erythema, swelling, or exudate on post pharynx.  Tonsils not swollen or erythematous. Hearing normal.  Neck: Supple, thyroid normal.  Respiratory: Respiratory effort normal, BS equal bilaterally without rales, rhonchi, wheezing or stridor.  Cardio: RRR with no MRGs. Brisk peripheral pulses without edema.  Abdomen: Soft, + BS.  Non tender, no guarding, rebound, hernias, masses. Lymphatics: Non tender without lymphadenopathy.  Musculoskeletal: Full ROM, 5/5 strength, antalgic gait walks with a cane, decreased range of motion of neck due to recent surgery. .  Skin: Warm, dry without rashes, lesions, ecchymosis.  Neuro: Cranial nerves intact. Normal muscle tone, no cerebellar symptoms.  Psych: Awake and oriented X 3, normal affect, tearful, restless.     Vicie Mutters, PA-C 2:05 PM [facility]

## 2015-10-05 NOTE — Patient Instructions (Addendum)
Please get back on the lexapro 20mg  daily Stop the depakote for now Add on short term wellbutrin Please call your network to find out which psychiatrist you can see and make an appointment quickly.   If you have any thought of hurting yourself or suicide please go to the ER

## 2015-10-16 ENCOUNTER — Other Ambulatory Visit: Payer: Self-pay | Admitting: Internal Medicine

## 2015-10-16 DIAGNOSIS — F988 Other specified behavioral and emotional disorders with onset usually occurring in childhood and adolescence: Secondary | ICD-10-CM

## 2015-10-16 DIAGNOSIS — M797 Fibromyalgia: Secondary | ICD-10-CM

## 2015-10-16 MED ORDER — CLONAZEPAM 2 MG PO TABS: 2.0000 mg | ORAL_TABLET | Freq: Two times a day (BID) | ORAL | Status: DC

## 2015-10-16 MED ORDER — AMPHETAMINE-DEXTROAMPHETAMINE 20 MG PO TABS: ORAL_TABLET | ORAL | Status: DC

## 2015-11-01 ENCOUNTER — Encounter (HOSPITAL_COMMUNITY): Payer: Self-pay

## 2015-11-01 ENCOUNTER — Emergency Department (HOSPITAL_COMMUNITY): Payer: Managed Care, Other (non HMO)

## 2015-11-01 ENCOUNTER — Emergency Department (HOSPITAL_COMMUNITY)
Admission: EM | Admit: 2015-11-01 | Discharge: 2015-11-01 | Disposition: A | Discharge status: Home / Self Care | Payer: Managed Care, Other (non HMO) | Attending: Physician Assistant | Admitting: Physician Assistant

## 2015-11-01 DIAGNOSIS — Y929 Unspecified place or not applicable: Secondary | ICD-10-CM | POA: Insufficient documentation

## 2015-11-01 DIAGNOSIS — E039 Hypothyroidism, unspecified: Secondary | ICD-10-CM | POA: Insufficient documentation

## 2015-11-01 DIAGNOSIS — S39012A Strain of muscle, fascia and tendon of lower back, initial encounter: Secondary | ICD-10-CM | POA: Diagnosis not present

## 2015-11-01 DIAGNOSIS — I10 Essential (primary) hypertension: Secondary | ICD-10-CM | POA: Diagnosis not present

## 2015-11-01 DIAGNOSIS — J449 Chronic obstructive pulmonary disease, unspecified: Secondary | ICD-10-CM | POA: Diagnosis not present

## 2015-11-01 DIAGNOSIS — X58XXXA Exposure to other specified factors, initial encounter: Secondary | ICD-10-CM | POA: Insufficient documentation

## 2015-11-01 DIAGNOSIS — Y939 Activity, unspecified: Secondary | ICD-10-CM | POA: Diagnosis not present

## 2015-11-01 DIAGNOSIS — Z85820 Personal history of malignant melanoma of skin: Secondary | ICD-10-CM | POA: Insufficient documentation

## 2015-11-01 DIAGNOSIS — R109 Unspecified abdominal pain: Secondary | ICD-10-CM | POA: Diagnosis not present

## 2015-11-01 DIAGNOSIS — G8929 Other chronic pain: Secondary | ICD-10-CM | POA: Diagnosis not present

## 2015-11-01 DIAGNOSIS — Y999 Unspecified external cause status: Secondary | ICD-10-CM | POA: Diagnosis not present

## 2015-11-01 DIAGNOSIS — Z8551 Personal history of malignant neoplasm of bladder: Secondary | ICD-10-CM | POA: Diagnosis not present

## 2015-11-01 DIAGNOSIS — Z7982 Long term (current) use of aspirin: Secondary | ICD-10-CM | POA: Insufficient documentation

## 2015-11-01 DIAGNOSIS — S3992XA Unspecified injury of lower back, initial encounter: Secondary | ICD-10-CM | POA: Diagnosis present

## 2015-11-01 DIAGNOSIS — Z87891 Personal history of nicotine dependence: Secondary | ICD-10-CM | POA: Diagnosis not present

## 2015-11-01 LAB — URINE MICROSCOPIC-ADD ON

## 2015-11-01 LAB — URINALYSIS, ROUTINE W REFLEX MICROSCOPIC
Bilirubin Urine: NEGATIVE
Glucose, UA: NEGATIVE mg/dL
Ketones, ur: NEGATIVE mg/dL
Nitrite: NEGATIVE
Protein, ur: NEGATIVE mg/dL
Specific Gravity, Urine: 1.022 (ref 1.005–1.030)
pH: 6 (ref 5.0–8.0)

## 2015-11-01 LAB — CBC WITH DIFFERENTIAL/PLATELET
Basophils Absolute: 0 10*3/uL (ref 0.0–0.1)
Basophils Relative: 0 %
Eosinophils Absolute: 0.3 10*3/uL (ref 0.0–0.7)
Eosinophils Relative: 3 %
HCT: 43.7 % (ref 36.0–46.0)
Hemoglobin: 14.6 g/dL (ref 12.0–15.0)
Lymphocytes Relative: 37 %
Lymphs Abs: 3.9 10*3/uL (ref 0.7–4.0)
MCH: 29.9 pg (ref 26.0–34.0)
MCHC: 33.4 g/dL (ref 30.0–36.0)
MCV: 89.4 fL (ref 78.0–100.0)
Monocytes Absolute: 0.7 10*3/uL (ref 0.1–1.0)
Monocytes Relative: 7 %
Neutro Abs: 5.4 10*3/uL (ref 1.7–7.7)
Neutrophils Relative %: 53 %
Platelets: 306 10*3/uL (ref 150–400)
RBC: 4.89 MIL/uL (ref 3.87–5.11)
RDW: 13.2 % (ref 11.5–15.5)
WBC: 10.4 10*3/uL (ref 4.0–10.5)

## 2015-11-01 LAB — COMPREHENSIVE METABOLIC PANEL
ALT: 22 U/L (ref 14–54)
AST: 21 U/L (ref 15–41)
Albumin: 4.4 g/dL (ref 3.5–5.0)
Alkaline Phosphatase: 102 U/L (ref 38–126)
Anion gap: 8 (ref 5–15)
BUN: 22 mg/dL — ABNORMAL HIGH (ref 6–20)
CO2: 27 mmol/L (ref 22–32)
Calcium: 9.4 mg/dL (ref 8.9–10.3)
Chloride: 107 mmol/L (ref 101–111)
Creatinine, Ser: 0.8 mg/dL (ref 0.44–1.00)
GFR calc Af Amer: >60 mL/min (ref >60)
GFR calc non Af Amer: >60 mL/min (ref >60)
Glucose, Bld: 83 mg/dL (ref 65–99)
Potassium: 4 mmol/L (ref 3.5–5.1)
Sodium: 142 mmol/L (ref 135–145)
Total Bilirubin: 0.4 mg/dL (ref 0.3–1.2)
Total Protein: 7.5 g/dL (ref 6.5–8.1)

## 2015-11-01 LAB — LIPASE, BLOOD: Lipase: 26 U/L (ref 11–51)

## 2015-11-01 MED ORDER — CEPHALEXIN 500 MG PO CAPS: 500.0000 mg | ORAL_CAPSULE | Freq: Once | ORAL | Status: DC

## 2015-11-01 MED ORDER — IOPAMIDOL (ISOVUE-300) INJECTION 61%
100.0000 mL | Freq: Once | INTRAVENOUS | Status: AC | PRN
  Administered 2015-11-01: 100 mL via INTRAVENOUS

## 2015-11-01 MED ORDER — ONDANSETRON HCL 4 MG/2ML IJ SOLN
4.0000 mg | Freq: Once | INTRAMUSCULAR | Status: AC
  Administered 2015-11-01: 4 mg via INTRAVENOUS
  Filled 2015-11-01: qty 2

## 2015-11-01 MED ORDER — KETOROLAC TROMETHAMINE 30 MG/ML IJ SOLN
30.0000 mg | Freq: Once | INTRAMUSCULAR | Status: AC
  Administered 2015-11-01: 30 mg via INTRAVENOUS
  Filled 2015-11-01: qty 1

## 2015-11-01 MED ORDER — OXYCODONE-ACETAMINOPHEN 5-325 MG PO TABS: 7.0000 | ORAL_TABLET | Freq: Four times a day (QID) | ORAL | 0 refills | Status: DC | PRN

## 2015-11-01 MED ORDER — FENTANYL CITRATE (PF) 100 MCG/2ML IJ SOLN
50.0000 ug | Freq: Once | INTRAMUSCULAR | Status: AC
  Administered 2015-11-01: 50 ug via INTRAVENOUS
  Filled 2015-11-01: qty 2

## 2015-11-01 NOTE — ED Triage Notes (Signed)
Pt started having LLQ abdominal pain and tenderness along with LL back pain started 3 days ago relieved by nothing, patient denies any other GI symptoms, denies any urinary symptoms, 1 episode of nausea and vomit last night after dinner, rates pain at a 10/10, has tried to treat at home with flexeril and anti inflammatory medication prescribed Dr. Berenice Primas her orthopedic MD, however the medications are "not touching the pain", pt also reports that she has a history of bladder CA in October of 2016 of which she has a tumor surgically removed, and pt is "worried that this pain feels like the same type of pain", pt also reports that she has been having her "toes turn a bluish/purple color" intermittently.

## 2015-11-01 NOTE — ED Provider Notes (Signed)
MSE was initiated and I personally evaluated the patient and placed orders (if any) at  3:11 PM on November 01, 2015.  The patient appears stable so that the remainder of the MSE may be completed by another provider.  Patient presents to the emergency room complaining of left lower quadrant abdominal pain, nausea, vomiting and left low back pain. She reports her pain is constant. She vomited this morning. No diarrhea. Patient originally triaged to fast track area. MSE completed and initial orders place. Will move to acute side. Patient has left lower quadrant tenderness to palpation on exam. She is afebrile and nontoxic appearing on exam.   Vitals:   11/01/15 1429  BP: 133/66  Pulse: 74  Resp: 16  Temp: 97.4 F (36.3 C)  TempSrc: Oral  SpO2: 100%      Waynetta Pean, PA-C 11/01/15 Bud, MD 11/03/15 [phone]

## 2015-11-01 NOTE — ED Provider Notes (Signed)
[place] DEPT Provider Note   CSN: [id number] Arrival date & time: 11/01/15  1424  First Provider Contact:  First MD Initiated Contact with Patient 11/01/15 1511        History   Chief Complaint Chief Complaint  Patient presents with  . Back Pain  . Abdominal Pain    HPI Asya Engelkes is a 55 y.o. female.  HPI   Patient is a 55 year old female with history of fibromyalgia, chronic pain, multiple surgeries presenting today with pain in her lower back. Patient has seen multiple providers for this back pain. She has recent C-spine surgery and saw her physician who did that. He sent her to a different orthopedist surgeon, Dr. Berenice Primas. Dr. Berenice Primas saw patient and recommended physical therapy. Patient states that the pain is been getting worse. She also has pain in her abdomen radiating to her back. She says this is similar to the pain that she had when she had "bladder cancer". Patient is been eating drinking normally. Patient istates the pain is worse with movement.  Patient has another appointment with Dr. Berenice Primas tomorrow morning. However she "just can't wait that long ".  No fevers, bladder or bowel incontinence.  Past Medical History:  Diagnosis Date  . ADD (attention deficit disorder)   . Anxiety   . Bladder neoplasm   . Cancer [facility]) 2015   Bladder cancer  . COPD (chronic obstructive pulmonary disease) ([place])   . Depression   . Fibroids   . Fibromyalgia   . H/O bronchitis    treated within the last week w steroids, antibx tessalon pearls.   . H/O sebaceous cyst 07/18/14   left breast   . Hemorrhoid   . History of melanoma excision    leg  . Hyperlipidemia   . Hypertension    normal now, only took medications for 1 month- thinks it was from pain.  Marland Kitchen Hypothyroidism   . Iron deficiency anemia   . PONV (postoperative nausea and vomiting)    used a scop patch last surg-did well  . Prediabetes   . Restless leg syndrome   . Shortness of breath dyspnea    with  exertion  . Shoulder impingement    right  . SUI (stress urinary incontinence, female)   . Wears glasses     Patient Active Problem List   Diagnosis Date Noted  . Ankle pain 08/14/2015  . Pleuritic chest pain 08/03/2015  . Radicular pain 08/02/2015  . Peripheral sensory neuropathy ([place]) 05/10/2015  . Obesity 07/05/2014  . Vitamin D deficiency 07/05/2014  . Thoracic aorta atherosclerosis ([place]) 07/05/2014  . Genetic testing 03/04/2014  . Cancer ([place])   . Pain in the wrist 12/11/2013  . Synovitis and tenosynovitis 12/11/2013  . Arthritis of knee, degenerative 12/11/2013  . Elevated WBC count 12/11/2013  . Ache in joint 12/11/2013  . Contusion of knee 12/11/2013  . Contracture of ankle and foot joint 12/11/2013  . Closed fracture of coccyx ([place]) 12/11/2013  . Fracture of acetabulum ([place]) 12/11/2013  . Radiculopathy 09/01/2013  . Hypothyroidism   . Restless leg syndrome   . Fibromyalgia   . Hypertension   . Hyperlipidemia   . COPD (chronic obstructive pulmonary disease) ([place])   . GERD (gastroesophageal reflux disease)   . Prediabetes   . Bipolar 2 disorder, major depressive episode ([place])   . Arthrodesis status 11/14/2011  . Stress fracture of tibia 07/03/2011  . Arthralgia of ankle or foot 07/03/2011  . Avascular necrosis of talus ([place])  07/01/2011    Past Surgical History:  Procedure Laterality Date  . ABDOMINAL HYSTERECTOMY    . ANKLE FUSION Right 2011  . ANTERIOR CERVICAL DECOMP/DISCECTOMY FUSION N/A 09/01/2013   Procedure: Anterior cervical decompression fusion, cervical 5-6 with instrumentation and allograft;  Surgeon: Sinclair Ship, MD;  Location: [place];  Service: Orthopedics;  Laterality: N/A;  Anterior cervical decompression fusion, cervical 5-6 with instrumentation and allograft  . ANTERIOR CERVICAL DECOMP/DISCECTOMY FUSION N/A 08/02/2015   Procedure: ANTERIOR CERVICAL DECOMPRESSION FUSION CERVICAL 6 - THORACIC 1 WITH INSTRUMENTATION AND ALLOGRAFT;  Surgeon:  Phylliss Bob, MD;  Location: [place];  Service: Orthopedics;  Laterality: N/A;  ANTERIOR CERVICAL DECOMPRESSION FUSION, CERVICAL 6-7,CERVICAL 7-THORACIC 7 WITH INSTRUMENTATION AND ALLOGRAFT  . CATARACT EXTRACTION W/ INTRAOCULAR LENS IMPLANT Bilateral   . CLOSED REDUCTION AND PINNING LEFT FIFTH  PROXIMAL PHALANGEAL FX  10-09-2005  . CYSTOSCOPY W/ RETROGRADES Left 01/11/2014   Procedure: CYSTOSCOPY WITH RETROGRADE PYELOGRAM;  Surgeon: Festus Aloe, MD;  Location: [facility];  Service: Urology;  Laterality: Left;  . CYSTOSCOPY WITH STENT PLACEMENT Left 01/11/2014   Procedure: CYSTOSCOPY WITH STENT PLACEMENT;  Surgeon: Festus Aloe, MD;  Location: [facility];  Service: Urology;  Laterality: Left;  . [place] ENDOMETRIAL ABLATION  07-16-2006  . KNEE ARTHROSCOPY WITH MEDIAL MENISECTOMY Right 05/22/2012   Procedure: KNEE ARTHROSCOPY WITH MEDIAL MENISECTOMY and Medial [place] excision;  Surgeon: Alta Corning, MD;  Location: [place];  Service: Orthopedics;  Laterality: Right;  . LAPAROSCOPIC ASSISTED VAGINAL HYSTERECTOMY N/A 08/29/2014   Procedure: LAPAROSCOPIC ASSISTED VAGINAL HYSTERECTOMY;  Surgeon: Molli Posey, MD;  Location: [place];  Service: Gynecology;  Laterality: N/A;  . LAPAROSCOPIC CHOLECYSTECTOMY  2005  . LAPAROTOMY N/A 08/29/2014   Procedure: EXPLORATORY LAPAROTOMY WITH LIGATION OF BLEEDER;  Surgeon: Molli Posey, MD;  Location: [facility];  Service: Gynecology;  Laterality: N/A;  . POSTERIOR CERVICAL FUSION/FORAMINOTOMY N/A 01/25/2015   Procedure: POSTERIOR CERVICAL DECOMPRESSSION CERVICAL 7-THORACIC 1;  Surgeon: Phylliss Bob, MD;  Location: [place];  Service: Orthopedics;  Laterality: N/A;  Posterior cervical decompression, cervical 5-6, cervical 6-7, cervical 7-thoracic 1   . PUBOVAGINAL SLING N/A 08/29/2014   Procedure: Okey Dupre SINGLE INCISION SLING;  Surgeon: Molli Posey, MD;  Location: [facility];  Service: Gynecology;  Laterality: N/A;  . SALPINGOOPHORECTOMY Bilateral 08/29/2014   Procedure: SALPINGO OOPHORECTOMY;  Surgeon: Molli Posey, MD;  Location: [facility];  Service: Gynecology;  Laterality: Bilateral;  . SHOULDER ARTHROSCOPY W/ SUBACROMIAL DECOMPRESSION AND DISTAL CLAVICLE EXCISION Left 11-27-2011   W/  DEBRIDEMENT  . SHOULDER ARTHROSCOPY WITH BICEPSTENOTOMY Right 12/21/2014   Procedure: SHOULDER ARTHROSCOPY WITH BICEPSTENOTOMY;  Surgeon: Dorna Leitz, MD;  Location: [place];  Service: Orthopedics;  Laterality: Right;  . SHOULDER ARTHROSCOPY WITH DISTAL CLAVICLE RESECTION Right 12/21/2014   Procedure: SHOULDER ARTHROSCOPY WITH DISTAL CLAVICLE RESECTION;  Surgeon: Dorna Leitz, MD;  Location: [place];  Service: Orthopedics;  Laterality: Right;  . SHOULDER ARTHROSCOPY WITH OPEN ROTATOR CUFF REPAIR Right 07/12/2015   Procedure: SHOULDER ARTHROSCOPY WITH ACROMIOPLASTY AND OPEN ROTATOR CUFF REPAIR;  Surgeon: Dorna Leitz, MD;  Location: [place];  Service: Orthopedics;  Laterality: Right;  . SHOULDER ARTHROSCOPY WITH SUBACROMIAL DECOMPRESSION Left 11/10/2013   Procedure: SHOULDER ARTHROSCOPY WITH SUBACROMIAL DECOMPRESSION WITH ROTATOR CUFF REPAIR AND BICEPS TENODESIS;  Surgeon: Alta Corning, MD;  Location: [place];  Service: Orthopedics;  Laterality: Left;  .  SHOULDER ARTHROSCOPY WITH SUBACROMIAL DECOMPRESSION Right 12/21/2014   Procedure: SHOULDER ARTHROSCOPY WITH SUBACROMIAL DECOMPRESSION;  Surgeon: Dorna Leitz, MD;  Location: ;  Service: Orthopedics;  Laterality: Right;  . TONSILLECTOMY  age 1  . TRANSURETHRAL RESECTION OF BLADDER TUMOR N/A 01/11/2014   Procedure: TRANSURETHRAL RESECTION OF BLADDER TUMOR (TURBT);  Surgeon: Festus Aloe, MD;  Location: [facility];  Service: Urology;  Laterality: N/A;  . TUBAL LIGATION  1980    OB History      No data available       Home Medications    Prior to Admission medications   Medication Sig Start Date End Date Taking? Authorizing Provider  albuterol (VENTOLIN HFA) 108 (90 Base) MCG/ACT inhaler Inhale 2 puffs into the lungs every 4 (four) hours as needed for wheezing or shortness of breath. 09/08/15  Yes Vicie Mutters, PA-C  amphetamine-dextroamphetamine (ADDERALL) 20 MG tablet Take 1/2 to 1 tablet 1 or 2 x day only if needed for ADD Patient taking differently: Take 10-20 mg by mouth 2 (two) times daily as needed (ADD).  10/16/15  Yes Courtney Forcucci, PA-C  aspirin 81 MG tablet Take 81 mg by mouth daily.   Yes Historical Provider, MD  buPROPion (WELLBUTRIN XL) 150 MG 24 hr tablet Take 1 tablet (150 mg total) by mouth every morning. 10/05/15 10/04/16 Yes Vicie Mutters, PA-C  cholecalciferol (VITAMIN D) 1000 UNITS tablet Take 7,000 Units by mouth at bedtime.    Yes Historical Provider, MD  clonazePAM (KLONOPIN) 2 MG tablet Take 1 tablet (2 mg total) by mouth 2 (two) times daily. 10/16/15 12/16/15 Yes Courtney Forcucci, PA-C  cyclobenzaprine (FLEXERIL) 5 MG tablet Take 5 mg by mouth 2 (two) times daily as needed for muscle spasms.  10/27/15  Yes Historical Provider, MD  escitalopram (LEXAPRO) 20 MG tablet Take 1 tablet (20 mg total) by mouth daily. 10/05/15 10/04/16 Yes Vicie Mutters, PA-C  furosemide (LASIX) 20 MG tablet Take 1-2 tablets (20-40 mg total) by mouth daily as needed for fluid. 09/08/15  Yes Vicie Mutters, PA-C  gabapentin (NEURONTIN) 600 MG tablet Take 1/2 to 1 tablet 3 to 4 x day for neuritis pains Patient taking differently: Take 300-600 mg by mouth 4 (four) times daily as needed (neuritis pains).  05/10/15 11/07/15 Yes Unk Pinto, MD  levothyroxine (SYNTHROID, LEVOTHROID) 75 MCG tablet Take 1 tablet (75 mcg total) by mouth daily before breakfast. 09/08/15  Yes Vicie Mutters, PA-C  Magnesium 250 MG TABS Take 500 mg by mouth daily.   Yes Historical Provider, MD  MAGNESIUM PO Take  450 mg by mouth daily.   Yes Historical Provider, MD  methocarbamol (ROBAXIN) 500 MG tablet Take 1,000 mg by mouth every 6 (six) hours as needed for muscle spasms.  08/16/15  Yes Historical Provider, MD  oxyCODONE-acetaminophen (PERCOCET/ROXICET) 5-325 MG tablet Take 7 tablets by mouth every 6 (six) hours as needed for severe pain. 11/01/15   Nakyiah Kuck Lyn Harlei Lehrmann, MD  tiZANidine (ZANAFLEX) 2 MG tablet Take 1 tablet (2 mg total) by mouth every 8 (eight) hours as needed for muscle spasms. Patient not taking: Reported on 11/01/2015 07/12/15   Gary Fleet, PA-C    Family History Family History  Problem Relation Age of Onset  . Cancer Father     Lung and stomach   . Heart disease Father   . Hypertension Father   . Stroke Father   . Heart failure Paternal Grandfather   . Cancer Paternal Grandfather     colon  cancer  . Ovarian cancer Mother 49  . Breast cancer Mother     dx in her late 63s-50s  . Thyroid cancer Mother   . Ovarian cancer Sister 29  . Cancer Brother     abdominal tumor  . Ovarian cancer Maternal Aunt   . Breast cancer Maternal Aunt   . Breast cancer Maternal Uncle   . Breast cancer Paternal Aunt   . Ovarian cancer Sister 34  . Ovarian cancer Sister 41  . Cancer Brother     abdominal tumor  . Breast cancer Paternal Aunt   . Breast cancer Paternal Aunt   . Breast cancer Paternal Aunt   . Breast cancer Cousin   . Breast cancer Cousin   . Mental retardation Son     Social History Social History  Substance Use Topics  . Smoking status: Former Smoker    Packs/day: 1.00    Years: 30.00    Types: Cigarettes    Quit date: 06/24/2009  . Smokeless tobacco: Never Used  . Alcohol use No     Allergies   Sulfa antibiotics   Review of Systems Review of Systems  Constitutional: Negative for activity change, fatigue and fever.  Respiratory: Negative for shortness of breath.   Cardiovascular: Negative for chest pain.  Gastrointestinal: Negative for abdominal pain.   Musculoskeletal: Positive for back pain.  Neurological: Negative for dizziness and light-headedness.  Psychiatric/Behavioral: Negative for confusion.  All other systems reviewed and are negative.    Physical Exam Updated Vital Signs BP 118/59 (BP Location: Right Arm)   Pulse 60   Temp 97.4 F (36.3 C) (Oral)   Resp 17   SpO2 98%   Physical Exam  Constitutional: She appears well-developed and well-nourished. No distress.  HENT:  Head: Normocephalic and atraumatic.  Eyes: Conjunctivae are normal.  Neck: Neck supple.  Cardiovascular: Normal rate and regular rhythm.   No murmur heard. Pulmonary/Chest: Effort normal and breath sounds normal. No respiratory distress.  Abdominal: Soft. There is tenderness.  Musculoskeletal: She exhibits no edema.  Pain to the back , normal sensation and strength bilateral LE.   Neurological: She is alert.  Skin: Skin is warm and dry.  Psychiatric: She has a normal mood and affect.  Nursing note and vitals reviewed.    ED Treatments / Results  Labs (all labs ordered are listed, but only abnormal results are displayed) Labs Reviewed  COMPREHENSIVE METABOLIC PANEL - Abnormal; Notable for the following:       Result Value   BUN 22 (*)    All other components within normal limits  URINALYSIS, ROUTINE W REFLEX MICROSCOPIC (NOT AT [facility]) - Abnormal; Notable for the following:    APPearance CLOUDY (*)    Hgb urine dipstick TRACE (*)    Leukocytes, UA TRACE (*)    All other components within normal limits  URINE MICROSCOPIC-ADD ON - Abnormal; Notable for the following:    Squamous Epithelial / LPF 6-30 (*)    Bacteria, UA MANY (*)    All other components within normal limits  LIPASE, BLOOD  CBC WITH DIFFERENTIAL/PLATELET    EKG  EKG Interpretation None       Radiology Ct Lumbar Spine Wo Contrast  Result Date: 11/01/2015 CLINICAL DATA:  Left lower quadrant abdominal pain and tenderness and left low back pain for 3 days. One episode  of nausea and vomiting. History of bladder cancer diagnosed in October, 2016 which was subsequently removed. EXAM: CT LUMBAR SPINE WITHOUT CONTRAST TECHNIQUE:  Multidetector CT imaging of the lumbar spine was performed without intravenous contrast administration. Multiplanar CT image reconstructions were also generated. COMPARISON:  MRI lumbar spine 03/09/2015. FINDINGS: Vertebral body height is maintained. Trace retrolisthesis L4 on L5 and L5 on S1 is unchanged. No pars interarticularis defect is identified. Mild disc bulging is seen at L4-5 and L5-S1. Mild central canal narrowing at L4-5 without foraminal stenosis is seen. Mild bilateral foraminal narrowing L5-S1 without central canal stenosis is also seen. The appearance is unchanged. No new abnormality. IMPRESSION: No acute abnormality. No change in mild lower lumbar spondylosis as described above. Electronically Signed   By: Inge Rise M.D.   On: 11/01/2015 18:06  Ct Abdomen Pelvis W Contrast  Result Date: 11/01/2015 CLINICAL DATA:  Left lower quadrant abdominal pain and tenderness and left low back pain for 3 days. One episode of nausea and vomiting. History of bladder cancer diagnosed in October, 2016 which was subsequently removed. EXAM: CT ABDOMEN AND PELVIS WITH CONTRAST TECHNIQUE: Multidetector CT imaging of the abdomen and pelvis was performed using the standard protocol following bolus administration of intravenous contrast. CONTRAST:  100 ml ISOVUE-300 IOPAMIDOL (ISOVUE-300) INJECTION 61% COMPARISON:  CT abdomen 05/18/2015. CT abdomen and pelvis 01/16/2014. FINDINGS: Mild dependent bibasilar atelectasis is seen. No pleural or pericardial effusion. The gallbladder has been removed. The liver, spleen, pancreas and kidneys are unremarkable. Small bilateral adrenal adenomas are unchanged Small fat containing umbilical hernia is identified. Scattered aortoiliac atherosclerosis without aneurysm is seen. The patient is status post hysterectomy. The  stomach, small and large bowel and appendix appear normal. A small well-circumscribed lesion in the anterior right ilium is unchanged and appears benign. Imaged bones are otherwise unremarkable. IMPRESSION: No acute abnormality abdomen or pelvis. No finding to explain the patient's symptoms. Status post cholecystectomy and hysterectomy. Atherosclerosis. Electronically Signed   By: Inge Rise M.D.   On: 11/01/2015 18:01   Procedures Procedures (including critical care time)  Medications Ordered in ED Medications  fentaNYL (SUBLIMAZE) injection 50 mcg (50 mcg Intravenous Given 11/01/15 1611)  ondansetron (ZOFRAN) injection 4 mg (4 mg Intravenous Given 11/01/15 1611)  iopamidol (ISOVUE-300) 61 % injection 100 mL (100 mLs Intravenous Contrast Given 11/01/15 1724)  ketorolac (TORADOL) 30 MG/ML injection 30 mg (30 mg Intravenous Given 11/01/15 1824)     Initial Impression / Assessment and Plan / ED Course  I have reviewed the triage vital signs and the nursing notes.  Pertinent labs & imaging results that were available during my care of the patient were reviewed by me and considered in my medical decision making (see chart for details).  Clinical Course    Patient is a 55 year old female with history of fibromyalgia, chronic pain, chronic back pain, multiple back and neck surgeries, bladder cancer. She is presenting today with increasing back pain. She seen multiple providers for this in the past. She says it's just been getting worse.. We will get CT of her abdomen given the abdominal pain on exam. Additionally we'll do recons of the lumbar spine to make sure there is no acute pathology requiring intervention.  Patient's physical exam is very reassuring. Anticipate ability to discharge home with follow-up with her provider tomorrow morning.  All work up negative. Will give pain medication and follow up with Dr. Berenice Primas in the am as planned.    Final Clinical Impressions(s) / ED Diagnoses     Final diagnoses:  Lumbar strain, initial encounter    New Prescriptions Discharge Medication List as of 11/01/2015  6:43  PM    START taking these medications   Details  oxyCODONE-acetaminophen (PERCOCET/ROXICET) 5-325 MG tablet Take 7 tablets by mouth every 6 (six) hours as needed for severe pain., Starting Wed 11/01/2015, Print         Jonael Paradiso Julio Alm, MD 11/01/15 [phone]

## 2015-11-01 NOTE — Discharge Instructions (Signed)
We are unsure what is causing your pain, please follow up with Dr. Berenice Primas tomorrow. Your CT showed no reason for your abdominal pain and your labs and vital signs were reassuring.

## 2015-11-01 NOTE — ED Notes (Signed)
Report given to Laura RN

## 2015-11-01 NOTE — ED Notes (Signed)
Bed: [id number] Expected date:  Expected time:  Means of arrival:  Comments: 29

## 2015-11-21 ENCOUNTER — Telehealth: Payer: Self-pay | Admitting: *Deleted

## 2015-11-23 ENCOUNTER — Other Ambulatory Visit: Payer: Self-pay | Admitting: Internal Medicine

## 2015-11-23 DIAGNOSIS — F988 Other specified behavioral and emotional disorders with onset usually occurring in childhood and adolescence: Secondary | ICD-10-CM

## 2015-11-23 DIAGNOSIS — M797 Fibromyalgia: Secondary | ICD-10-CM

## 2015-11-24 ENCOUNTER — Ambulatory Visit (INDEPENDENT_AMBULATORY_CARE_PROVIDER_SITE_OTHER): Payer: Managed Care, Other (non HMO) | Admitting: Internal Medicine

## 2015-11-24 ENCOUNTER — Encounter: Payer: Self-pay | Admitting: Internal Medicine

## 2015-11-24 VITALS — BP 110/76 | HR 80 | Temp 97.5°F | Resp 16 | Ht 65.0 in | Wt 198.6 lb

## 2015-11-24 DIAGNOSIS — M5440 Lumbago with sciatica, unspecified side: Secondary | ICD-10-CM | POA: Diagnosis not present

## 2015-11-24 DIAGNOSIS — G629 Polyneuropathy, unspecified: Secondary | ICD-10-CM | POA: Diagnosis not present

## 2015-11-24 DIAGNOSIS — G608 Other hereditary and idiopathic neuropathies: Secondary | ICD-10-CM

## 2015-11-24 DIAGNOSIS — M549 Dorsalgia, unspecified: Secondary | ICD-10-CM | POA: Insufficient documentation

## 2015-11-24 MED ORDER — GABAPENTIN 600 MG PO TABS: ORAL_TABLET | ORAL | 0 refills | Status: DC

## 2015-11-24 NOTE — Patient Instructions (Addendum)
Recommend that you take your Gabapentin 3 to 4 x/day as need for pain  --------------------------------------  Discuss with Dr Lynann Bologna, Dr Berenice Primas & Dr Christene Lye what medicines they feel are appropriate to use for back pains & muscle spasm  ---------------------------------------  Please re-schedule to see a Psychiatrist as discussed with Estill Bamberg to discuss treatment of your Bipolar disorder, Anxiety and OCD habits.   -----------------------------------------   Back Pain, Adult Back pain is very common in adults.The cause of back pain is rarely dangerous and the pain often gets better over time.The cause of your back pain may not be known. Some common causes of back pain include:  Strain of the muscles or ligaments supporting the spine.  Wear and tear (degeneration) of the spinal disks.  Arthritis.  Direct injury to the back. For many people, back pain may return. Since back pain is rarely dangerous, most people can learn to manage this condition on their own. HOME CARE INSTRUCTIONS Watch your back pain for any changes. The following actions may help to lessen any discomfort you are feeling:  Remain active. It is stressful on your back to sit or stand in one place for long periods of time. Do not sit, drive, or stand in one place for more than 30 minutes at a time. Take short walks on even surfaces as soon as you are able.Try to increase the length of time you walk each day.  Exercise regularly as directed by your health care provider. Exercise helps your back heal faster. It also helps avoid future injury by keeping your muscles strong and flexible.  Do not stay in bed.Resting more than 1-2 days can delay your recovery.  Pay attention to your body when you bend and lift. The most comfortable positions are those that put less stress on your recovering back. Always use proper lifting techniques, including:  Bending your knees.  Keeping the load close to your body.  Avoiding  twisting.  Find a comfortable position to sleep. Use a firm mattress and lie on your side with your knees slightly bent. If you lie on your back, put a pillow under your knees.  Avoid feeling anxious or stressed.Stress increases muscle tension and can worsen back pain.It is important to recognize when you are anxious or stressed and learn ways to manage it, such as with exercise.  Take medicines only as directed by your health care provider. Over-the-counter medicines to reduce pain and inflammation are often the most helpful.Your health care provider may prescribe muscle relaxant drugs.These medicines help dull your pain so you can more quickly return to your normal activities and healthy exercise.  Apply ice to the injured area:  Put ice in a plastic bag.  Place a towel between your skin and the bag.  Leave the ice on for 20 minutes, 2-3 times a day for the first 2-3 days. After that, ice and heat may be alternated to reduce pain and spasms.  Maintain a healthy weight. Excess weight puts extra stress on your back and makes it difficult to maintain good posture. SEEK MEDICAL CARE IF:  You have pain that is not relieved with rest or medicine.  You have increasing pain going down into the legs or buttocks.  You have pain that does not improve in one week.  You have night pain.  You lose weight.  You have a fever or chills. SEEK IMMEDIATE MEDICAL CARE IF:   You develop new bowel or bladder control problems.  You have unusual weakness or  numbness in your arms or legs.  You develop nausea or vomiting.  You develop abdominal pain.  You feel faint.

## 2015-11-24 NOTE — Progress Notes (Signed)
Subjective:    Patient ID: [name], female    DOB: 09-19-1960, 55 y.o.   MRN: [id number]  HPI This very nice 55 yo MWF with Bipolar disorder, Fibromyalgia , DDD and chronic c/o's pain  was seen at the ER yesterday for LBP and had (-) Abd CTscan and Lumbar CT showing mild spondylosis and was given iv Fentenyl and released w/rx for #7 percocet. Today she present asking for a refill for Adderall ahead of schedule which she relates she takes for Fibromyalgia and for her OCD obsessions & compulsions. She also asked for refills for three(3) different muscle relaxers (Flexeril, Robaxin & Zanaflex). In addition she requested refill of Klonepin and Gabapentin (which she only takes 2 x/day). Patient was reminded that 6 weeks ago she was referred by Vicie Mutters, PA  for psychiatric evaluation & f/u to manage her psychotrophic meds and patient related she cancelled the appointment. She did relate she had seen Dr Berenice Primas recently and has been scheduled for "back" injection with [facility].    Medication Sig  . VENTOLIN HFA inhaler Inhale 2 puffs into the lungs every 4 (four) hours as needed  . aspirin 81 MG  Take 81 mg by mouth daily.  . WELLBUTRIN XL 150 MG  Take 1 tab every morning.  Marland Kitchen VITAMIN D 1000 UNITS  Take 7,000 Units  at bedtime.   Marland Kitchen LEXAPRO 20 MG  Take 1 tablet (20 mg total) by mouth daily.  Marland Kitchen LASIX 20 MG tablet Take 1-2 tablets (20-40 mg total) by mouth daily as needed for fluid.  Marland Kitchen levothyroxine  75 MCG  Take 1 tablet (75 mcg total) by mouth daily before breakfast.  . Magnesium 250 MG  Take 500 mg by mouth daily.  Marland Kitchen MAGNESIUM  Take 450 mg by mouth daily.  Marland Kitchen oxyCODONE-APAP 5-325  Take1 tab every 6 hours as needed for severe pain  . ADDERALL 20 MG Take 1/2 to 1 tab 1 or 2 x day only if needed for ADD   . KLONOPIN 2 MG  Take 1 tab 2  times daily.  Marland Kitchen FLEXERIL 5 MG  Take  2  times daily as needed for muscle spasms.   . ROBAXIN 500 MG  Take 1,000 mg  every 6  hrs as needed for  muscle spasms.   Marland Kitchen ZANAFLEX 2 MG Take 1 tab every 8  hours as needed for muscle spasms.  Marland Kitchen gabapentin (NEURONTIN) 600 MG  Take 1/2 to 1 tablet 3 to 4 x day for neuritis pains   Allergies  Allergen Reactions  . Sulfa Antibiotics Hives and Itching   Past Medical History:  Diagnosis Date  . ADD (attention deficit disorder)   . Anxiety   . Bladder neoplasm   . Cancer [facility]) 2015   Bladder cancer  . COPD (chronic obstructive pulmonary disease) ([place])   . Depression   . Fibroids   . Fibromyalgia   . H/O bronchitis    treated within the last week w steroids, antibx tessalon pearls.   . H/O sebaceous cyst 07/18/14   left breast   . Hemorrhoid   . History of melanoma excision    leg  . Hyperlipidemia   . Hypertension    normal now, only took medications for 1 month- thinks it was from pain.  Marland Kitchen Hypothyroidism   . Iron deficiency anemia   . PONV (postoperative nausea and vomiting)    used a scop patch last surg-did well  . Prediabetes   .  Restless leg syndrome   . Shortness of breath dyspnea    with exertion  . Shoulder impingement    right  . SUI (stress urinary incontinence, female)   . Wears glasses    Past Surgical History:  Procedure Laterality Date  . ABDOMINAL HYSTERECTOMY    . ANKLE FUSION Right 2011  . ANT Cx DECOMP/DISC FUSION  - Phylliss Bob, MD N/A 09/01/2013  . ANT Cx DECOMP/DISCECTOMY Santiago Bumpers, MD N/A 08/02/2015  . CATARACT EXTRACTION W/ INTRAOCULAR LENS IMPLANT Bilateral   . REDUCTION AND PINNING Lt 5thPROXIMAL PHALANGEAL FX  10-09-2005  . CYSTOSCOPY W/ RETROGRADES  Festus Aloe, MD Left 01/11/2014  . CYSTOSCOPY WITH STENT PLACEMENT Left 01/11/2014   Procedure: CYSTOSCOPY WITH STENT PLACEMENT;  Surgeon: Festus Aloe, MD;    . D & C HYSTEROSCOPY w/ ENDOMETRIAL ABLATION  07-16-2006  . KNEE ARTHROSCOPY WITH MEDIAL MENISECTOMY Right 05/22/2012   Procedure: KNEE ARTHROSCOPY WITH MEDIAL MENISECTOMY and Medial Plica excision;  Surgeon: Alta Corning,  MD;  Location: [place];  Service: Orthopedics;  Laterality: Right;  . LAP  VAGINAL HYSTERECTOMY  - Molli Posey, MD N/A 08/29/2014  . LAPAROSCOPIC CHOLECYSTECTOMY  2005  . LAPAROTOMY  - Molli Posey, MD N/A 08/29/2014  . POSTERIOR CERVICAL FUSION/FORAMINOTOMY N/A 01/25/2015  . PUBOVAGINAL SLING  - Molli Posey, MD N/A 08/29/2014  . SALPINGOOPHORECTOMY Molli Posey, MD Bilateral 08/29/2014  . SHOULDER ARTHROS W/ SUBACROMIAL DECOMPRESSION AND DISTAL CLAVICLE EXCISION Left 11-27-2011  . SHOULDER ARTHROS w/ Joanne Chars, Right 12/21/2014  . SHOULDER ARTHROSCOPY WITH DISTAL CLAVICLE RESECTION  - Dorna Leitz, MD; Right 12/21/2014  . SHOULDER ARTHROSCOPY WITH OPEN ROTATOR CUFF REPAIR  - Dorna Leitz, MD Right 07/12/2015  . SHOULDER ARTHROSCOPY WITH SUBACROMIAL DECOMPRESSION  - Alta Corning, MD Left 11/10/2013  . SHOULDER ARTHROSCOPY WITH SUBACROMIAL DECOMPRESSION  - Dorna Leitz, MD Right 12/21/2014  . TONSILLECTOMY  age 22  . TUR OF BLADDER TUMOR  - Festus Aloe, MD;  N/A 01/11/2014  . TUBAL LIGATION  1980   Review of Systems  10 point systems review negative except as above.      Objective:   Physical Exam  BP 110/76   Pulse 80   Temp 97.5 F (36.4 C)   Resp 16   Ht 5\' 5"  (1.651 m)   Wt 198 lb 9.6 oz (90.1 kg)   BMI 33.05 kg/m   Pressured speech. In No Distress  HEENT - WNL Neck - supple.  Chest - Clear. Cor - Nl HS. RRR w/o sig MGR.  Abd - Soft, non-tender & benign. MS- FROM w/o deformities. Moves about freely w/o any apparent discomfort. Gait Nl. Neuro - No obvious Cr N abnormalities.  Nl w/o focal abnormalities. Psyche - Mental status normal, pleasant  & appropriate.  No delusions, ideations or obvious mood abnormalities.     Assessment & Plan:   1. Low back pain with sciatica, sciatica laterality unspecified, unspecified back pain laterality  - Patient was advised that there would be no refills for muscle relaxers via this off & that  she would have to obtain from the providers treating gher for back pain. Also, she was advised no refill for Adderall or Klonopin  and that they would have to be prescribed by her Psychiatrist if they felt appropriate and she was advised that Benzodiazepines are relatively contraindicated to be taken with Opioid medications. She was encouraged to take her Gabapentin up to 4 x day and that dosage  may be increased as tolerated. She was advised & reminded that no psychotropic meds or pain meds would be prescribed by this practice.   Over 25 minutes of exam, counseling, chart review and critical decision making was performed

## 2015-11-30 ENCOUNTER — Other Ambulatory Visit: Payer: Self-pay | Admitting: Internal Medicine

## 2015-11-30 ENCOUNTER — Other Ambulatory Visit: Payer: Self-pay | Admitting: Orthopedic Surgery

## 2015-11-30 DIAGNOSIS — M533 Sacrococcygeal disorders, not elsewhere classified: Secondary | ICD-10-CM

## 2015-12-12 ENCOUNTER — Ambulatory Visit
Admission: RE | Admit: 2015-12-12 | Discharge: 2015-12-12 | Disposition: A | Discharge status: Home / Self Care | Payer: Managed Care, Other (non HMO) | Source: Ambulatory Visit | Attending: Orthopedic Surgery | Admitting: Orthopedic Surgery

## 2015-12-12 DIAGNOSIS — M533 Sacrococcygeal disorders, not elsewhere classified: Secondary | ICD-10-CM

## 2015-12-12 MED ORDER — DIAZEPAM 5 MG PO TABS
10.0000 mg | ORAL_TABLET | Freq: Once | ORAL | Status: AC
  Administered 2015-12-12: 10 mg via ORAL

## 2015-12-14 ENCOUNTER — Other Ambulatory Visit: Payer: Self-pay | Admitting: Orthopedic Surgery

## 2015-12-14 DIAGNOSIS — M533 Sacrococcygeal disorders, not elsewhere classified: Secondary | ICD-10-CM

## 2015-12-27 ENCOUNTER — Ambulatory Visit
Admission: RE | Admit: 2015-12-27 | Discharge: 2015-12-27 | Disposition: A | Discharge status: Home / Self Care | Payer: Managed Care, Other (non HMO) | Source: Ambulatory Visit | Attending: Orthopedic Surgery | Admitting: Orthopedic Surgery

## 2015-12-27 DIAGNOSIS — M533 Sacrococcygeal disorders, not elsewhere classified: Secondary | ICD-10-CM

## 2015-12-27 MED ORDER — METHYLPREDNISOLONE ACETATE 40 MG/ML INJ SUSP (RADIOLOG: 120.0000 mg | Freq: Once | INTRAMUSCULAR | Status: DC

## 2016-02-12 ENCOUNTER — Ambulatory Visit (INDEPENDENT_AMBULATORY_CARE_PROVIDER_SITE_OTHER): Payer: Managed Care, Other (non HMO)

## 2016-02-12 ENCOUNTER — Encounter (INDEPENDENT_AMBULATORY_CARE_PROVIDER_SITE_OTHER): Payer: Self-pay | Admitting: Specialist

## 2016-02-12 ENCOUNTER — Ambulatory Visit (INDEPENDENT_AMBULATORY_CARE_PROVIDER_SITE_OTHER): Payer: Managed Care, Other (non HMO) | Admitting: Specialist

## 2016-02-12 VITALS — BP 123/73 | HR 72 | Ht 64.0 in | Wt 198.0 lb

## 2016-02-12 DIAGNOSIS — M5441 Lumbago with sciatica, right side: Secondary | ICD-10-CM

## 2016-02-12 DIAGNOSIS — G8929 Other chronic pain: Secondary | ICD-10-CM

## 2016-02-12 DIAGNOSIS — M7061 Trochanteric bursitis, right hip: Secondary | ICD-10-CM

## 2016-02-12 DIAGNOSIS — M5136 Other intervertebral disc degeneration, lumbar region: Secondary | ICD-10-CM | POA: Diagnosis not present

## 2016-02-12 MED ORDER — BUPIVACAINE HCL 0.25 % IJ SOLN
6.0000 mL | INTRAMUSCULAR | Status: AC | PRN
  Administered 2016-02-12: 6 mL via INTRA_ARTICULAR

## 2016-02-12 MED ORDER — LIDOCAINE HCL 1 % IJ SOLN
3.0000 mL | INTRAMUSCULAR | Status: AC | PRN
  Administered 2016-02-12: 3 mL

## 2016-02-12 MED ORDER — METHYLPREDNISOLONE ACETATE 40 MG/ML IJ SUSP
80.0000 mg | INTRAMUSCULAR | Status: AC | PRN
  Administered 2016-02-12: 80 mg

## 2016-02-12 NOTE — Progress Notes (Signed)
Office Visit Note   Patient: [name]           Date of Birth: 1960-06-01           MRN: [id number] Visit Date: 02/12/2016              Requested by: Unk Pinto, MD [address] [place], [place] [address] PCP: Alesia Richards, MD   Assessment & Plan: Visit Diagnoses:  1. Chronic right-sided low back pain with right-sided sciatica   2. Greater trochanteric bursitis of right hip   3. DDD (degenerative disc disease), lumbar     Plan: Advised patient today that I think most of the pain that she is describing is related to right hip trochanteric bursitis. Recommended conservative treatment with injection. After patient consent right lateral hip was prepped with Betadine and after using 3 mL 1% Xylocaine for local anesthetic greater trochanter bursa Marcaine/Depo-Medrol 6-2 injection was done. After sitting for a few minutes patient reported complete relief of her right lateral hip pain that extended down to her knee with Marcaine and place. Patient was given IT band stretching program to do at home. We'll refer patient to vascular vein specialist for evaluation of her right leg pain. Follow-up with [place] in 8 weeks for recheck. Advised patient that we do not think that surgery for her back is indicated at this point. Dr. Louanne Skye as reviewed recent CT lumbar spine along with his recent lumbar MRI scan. All questions answered.  Follow-Up Instructions: Return in about 8 weeks (around 04/08/2016).   Orders:  Orders Placed This Encounter  Procedures  . Large Joint Injection/Arthrocentesis  . XR Lumbar Spine 2-3 Views  . Ambulatory referral to Vascular Surgery   No orders of the defined types were placed in this encounter.     Procedures: Large Joint Inj Date/Time: 02/12/2016 4:36 PM Performed by: Lanae Crumbly Authorized by: Lanae Crumbly   Consent Given by:  Patient Indications:  Pain Location:  Hip Site:  R greater trochanter Prep: patient was prepped  and draped in usual sterile fashion   Needle Size:  22 G Needle Length:  3.5 inches Approach:  Lateral Ultrasound Guidance: No   Fluoroscopic Guidance: No   Arthrogram: No Medications:  3 mL lidocaine 1 %; 80 mg methylPREDNISolone acetate 40 MG/ML; 6 mL bupivacaine 0.25 % Aspiration Attempted: No   Patient tolerance:  Patient tolerated the procedure well with no immediate complications     Clinical Data: No additional findings.   Subjective: Chief Complaint  Patient presents with  . Lower Back - Pain, Follow-up    Low back pain with radiating pain and numbness in right leg. Pain since July. States fell about 2 years ago, broke her back. Has had pain since but worse since July. Also complains with right leg weakness. Ambulates with cane. States she has had injections they help for about 1 day then pain returns to the same. Pain increase with sitting and standing.   Patient comes in today for another opinion regarding low back and right leg pain. She has [name] been followed by Dr. Verlin Dike with [facility] orthopedics for lumbar spine issues. She's had multiple lumbar MRI scans over the last few years but she has not been found to have any compressive lesions requiring surgery. She complains of increased pain in the right buttock and lateral hip that extends down her IT band to her knee right lateral hip pain worsen she is ambulating and when she lies  on her side. No symptoms on the left side. No groin pain. She's had lumbar ESI but this did not help her right lateral hip or thigh pain. Patient and liquids with a cane due to ongoing chronic pain in her right ankle where she's had previous fusion by physician at [facility]. Patient had lower extremity NCV/EMG study by Dr. Wang jan 2017 and this report was negative. Dr. Jerelyn Charles physician assistant had previously recommended a vascular consult the patient did not go for this. Patient not sure as to whether or not she's had an injection to the  right lateral hip. She states that she had been seen at Drs. [place]'s office by one of his physicians for pain management but she no longer goes there. Patient states that she also has a psychiatrist but she has not started to medications that were recently recommended. One of them was risperdal.    Review of Systems  Constitutional: Negative.   HENT: Negative.   Respiratory: Negative.   Cardiovascular: Negative.   Gastrointestinal: Negative.   Genitourinary: Negative.   Musculoskeletal: Positive for back pain and gait problem.  Psychiatric/Behavioral: Negative.      Objective: Vital Signs: BP 123/73   Pulse 72   Ht 5\' 4"  (1.626 m)   Wt 198 lb (89.8 kg)   BMI 33.99 kg/m   Physical Exam  Constitutional: She is oriented to person, place, and time. No distress.  HENT:  Head: Normocephalic and atraumatic.  Eyes: Pupils are equal, round, and reactive to light.  Pulmonary/Chest: Effort normal.  Abdominal: She exhibits no distension.  Neurological: She is alert and oriented to person, place, and time.  Skin: Skin is warm.    Ortho Exam Gait is antalgic. Ambulates with a cane. Trendelenburg gait due to right lateral hip pain. Negative logroll bilaterally. Negative straight leg raise. She is markedly tender over the right hip greater trochanter bursa and is also tender down the IT band to her knee. Tender. Neurovascularly Intact. Right Ankle Limited Motion Due To Previous Fusion. Ankle Is Tender with Some Swelling. Specialty Comments:  No specialty comments available.  Imaging: Xr Lumbar Spine 2-3 Views  Result Date: 02/12/2016 Lumbar spine x-rays show areas of degenerative disc disease. Couple millimeters of L4 retrolisthesis. Impression lumbar degenerative disc disease. Couple millimeters of L4 retrolisthesis. No acute findings.    PMFS History: Patient Active Problem List   Diagnosis Date Noted  . Back pain 11/24/2015  . Radicular pain 08/02/2015  . Peripheral sensory  neuropathy ([place]) 05/10/2015  . Obesity 07/05/2014  . Vitamin D deficiency 07/05/2014  . Thoracic aorta atherosclerosis ([place]) 07/05/2014  . Genetic testing 03/04/2014  . Cancer ([place])   . Fracture of acetabulum ([place]) 12/11/2013  . Hypothyroidism   . Restless leg syndrome   . Fibromyalgia   . Hypertension   . Hyperlipidemia   . COPD (chronic obstructive pulmonary disease) ([place])   . GERD (gastroesophageal reflux disease)   . Prediabetes   . Bipolar 2 disorder, major depressive episode [facility])    Past Medical History:  Diagnosis Date  . ADD (attention deficit disorder)   . Anxiety   . Bladder neoplasm   . Cancer [facility]) 2015   Bladder cancer  . COPD (chronic obstructive pulmonary disease) ([place])   . Depression   . Fibroids   . Fibromyalgia   . H/O bronchitis    treated within the last week w steroids, antibx tessalon pearls.   . H/O sebaceous cyst 07/18/14   left breast   .  Hemorrhoid   . History of melanoma excision    leg  . Hyperlipidemia   . Hypertension    normal now, only took medications for 1 month- thinks it was from pain.  Marland Kitchen Hypothyroidism   . Iron deficiency anemia   . PONV (postoperative nausea and vomiting)    used a scop patch last surg-did well  . Prediabetes   . Restless leg syndrome   . Shortness of breath dyspnea    with exertion  . Shoulder impingement    right  . SUI (stress urinary incontinence, female)   . Wears glasses     Family History  Problem Relation Age of Onset  . Cancer Father     Lung and stomach   . Heart disease Father   . Hypertension Father   . Stroke Father   . Heart failure Paternal Grandfather   . Cancer Paternal Grandfather     colon cancer  . Ovarian cancer Mother 75  . Breast cancer Mother     dx in her late 35s-50s  . Thyroid cancer Mother   . Ovarian cancer Sister 45  . Cancer Brother     abdominal tumor  . Ovarian cancer Maternal Aunt   . Breast cancer Maternal Aunt   . Breast cancer Maternal Uncle   . Breast  cancer Paternal Aunt   . Ovarian cancer Sister 79  . Ovarian cancer Sister 37  . Cancer Brother     abdominal tumor  . Breast cancer Paternal Aunt   . Breast cancer Paternal Aunt   . Breast cancer Paternal Aunt   . Breast cancer Cousin   . Breast cancer Cousin   . Mental retardation Son     Past Surgical History:  Procedure Laterality Date  . ABDOMINAL HYSTERECTOMY    . ANKLE FUSION Right 2011  . ANTERIOR CERVICAL DECOMP/DISCECTOMY FUSION N/A 09/01/2013   Procedure: Anterior cervical decompression fusion, cervical 5-6 with instrumentation and allograft;  Surgeon: Sinclair Ship, MD;  Location: [place];  Service: Orthopedics;  Laterality: N/A;  Anterior cervical decompression fusion, cervical 5-6 with instrumentation and allograft  . ANTERIOR CERVICAL DECOMP/DISCECTOMY FUSION N/A 08/02/2015   Procedure: ANTERIOR CERVICAL DECOMPRESSION FUSION CERVICAL 6 - THORACIC 1 WITH INSTRUMENTATION AND ALLOGRAFT;  Surgeon: Phylliss Bob, MD;  Location: [place];  Service: Orthopedics;  Laterality: N/A;  ANTERIOR CERVICAL DECOMPRESSION FUSION, CERVICAL 6-7,CERVICAL 7-THORACIC 7 WITH INSTRUMENTATION AND ALLOGRAFT  . CATARACT EXTRACTION W/ INTRAOCULAR LENS IMPLANT Bilateral   . CLOSED REDUCTION AND PINNING LEFT FIFTH  PROXIMAL PHALANGEAL FX  10-09-2005  . CYSTOSCOPY W/ RETROGRADES Left 01/11/2014   Procedure: CYSTOSCOPY WITH RETROGRADE PYELOGRAM;  Surgeon: Festus Aloe, MD;  Location: [facility];  Service: Urology;  Laterality: Left;  . CYSTOSCOPY WITH STENT PLACEMENT Left 01/11/2014   Procedure: CYSTOSCOPY WITH STENT PLACEMENT;  Surgeon: Festus Aloe, MD;  Location: [facility];  Service: Urology;  Laterality: Left;  . [place] ENDOMETRIAL ABLATION  07-16-2006  . KNEE ARTHROSCOPY WITH MEDIAL MENISECTOMY Right 05/22/2012   Procedure: KNEE ARTHROSCOPY WITH MEDIAL MENISECTOMY and Medial Plica excision;  Surgeon: Alta Corning, MD;  Location: [place];  Service: Orthopedics;  Laterality: Right;  . LAPAROSCOPIC ASSISTED VAGINAL HYSTERECTOMY N/A 08/29/2014   Procedure: LAPAROSCOPIC ASSISTED VAGINAL HYSTERECTOMY;  Surgeon: Molli Posey, MD;  Location: [place];  Service: Gynecology;  Laterality: N/A;  . LAPAROSCOPIC CHOLECYSTECTOMY  2005  . LAPAROTOMY N/A 08/29/2014   Procedure:  EXPLORATORY LAPAROTOMY WITH LIGATION OF BLEEDER;  Surgeon: Molli Posey, MD;  Location: [facility];  Service: Gynecology;  Laterality: N/A;  . POSTERIOR CERVICAL FUSION/FORAMINOTOMY N/A 01/25/2015   Procedure: POSTERIOR CERVICAL DECOMPRESSSION CERVICAL 7-THORACIC 1;  Surgeon: Phylliss Bob, MD;  Location: [place];  Service: Orthopedics;  Laterality: N/A;  Posterior cervical decompression, cervical 5-6, cervical 6-7, cervical 7-thoracic 1   . PUBOVAGINAL SLING N/A 08/29/2014   Procedure: Okey Dupre SINGLE INCISION SLING;  Surgeon: Molli Posey, MD;  Location: [facility];  Service: Gynecology;  Laterality: N/A;  . SALPINGOOPHORECTOMY Bilateral 08/29/2014   Procedure: SALPINGO OOPHORECTOMY;  Surgeon: Molli Posey, MD;  Location: [facility];  Service: Gynecology;  Laterality: Bilateral;  . SHOULDER ARTHROSCOPY W/ SUBACROMIAL DECOMPRESSION AND DISTAL CLAVICLE EXCISION Left 11-27-2011   W/  DEBRIDEMENT  . SHOULDER ARTHROSCOPY WITH BICEPSTENOTOMY Right 12/21/2014   Procedure: SHOULDER ARTHROSCOPY WITH BICEPSTENOTOMY;  Surgeon: Dorna Leitz, MD;  Location: [place];  Service: Orthopedics;  Laterality: Right;  . SHOULDER ARTHROSCOPY WITH DISTAL CLAVICLE RESECTION Right 12/21/2014   Procedure: SHOULDER ARTHROSCOPY WITH DISTAL CLAVICLE RESECTION;  Surgeon: Dorna Leitz, MD;  Location: [place];  Service: Orthopedics;  Laterality: Right;  . SHOULDER ARTHROSCOPY WITH OPEN ROTATOR CUFF REPAIR Right 07/12/2015   Procedure: SHOULDER ARTHROSCOPY WITH ACROMIOPLASTY AND OPEN ROTATOR CUFF REPAIR;   Surgeon: Dorna Leitz, MD;  Location: [place];  Service: Orthopedics;  Laterality: Right;  . SHOULDER ARTHROSCOPY WITH SUBACROMIAL DECOMPRESSION Left 11/10/2013   Procedure: SHOULDER ARTHROSCOPY WITH SUBACROMIAL DECOMPRESSION WITH ROTATOR CUFF REPAIR AND BICEPS TENODESIS;  Surgeon: Alta Corning, MD;  Location: [place];  Service: Orthopedics;  Laterality: Left;  . SHOULDER ARTHROSCOPY WITH SUBACROMIAL DECOMPRESSION Right 12/21/2014   Procedure: SHOULDER ARTHROSCOPY WITH SUBACROMIAL DECOMPRESSION;  Surgeon: Dorna Leitz, MD;  Location: [place];  Service: Orthopedics;  Laterality: Right;  . TONSILLECTOMY  age 23  . TRANSURETHRAL RESECTION OF BLADDER TUMOR N/A 01/11/2014   Procedure: TRANSURETHRAL RESECTION OF BLADDER TUMOR (TURBT);  Surgeon: Festus Aloe, MD;  Location[facility];  Service: Urology;  Laterality: N/A;  . TUBAL LIGATION  1980   Social History   Occupational History  . logistic specialist [employer]    Social History Main Topics  . Smoking status: Former Smoker    Packs/day: 1.00    Years: 30.00    Types: Cigarettes    Quit date: 06/24/2009  . Smokeless tobacco: Never Used  . Alcohol use No  . Drug use: No  . Sexual activity: Not on file

## 2016-03-12 ENCOUNTER — Ambulatory Visit (INDEPENDENT_AMBULATORY_CARE_PROVIDER_SITE_OTHER): Payer: Managed Care, Other (non HMO) | Admitting: Physician Assistant

## 2016-03-12 ENCOUNTER — Encounter: Payer: Self-pay | Admitting: Physician Assistant

## 2016-03-12 VITALS — BP 122/84 | HR 84 | Temp 97.3°F | Resp 16 | Ht 64.0 in | Wt 202.6 lb

## 2016-03-12 DIAGNOSIS — R7303 Prediabetes: Secondary | ICD-10-CM | POA: Diagnosis not present

## 2016-03-12 DIAGNOSIS — Z23 Encounter for immunization: Secondary | ICD-10-CM | POA: Diagnosis not present

## 2016-03-12 DIAGNOSIS — E079 Disorder of thyroid, unspecified: Secondary | ICD-10-CM

## 2016-03-12 DIAGNOSIS — F329 Major depressive disorder, single episode, unspecified: Secondary | ICD-10-CM | POA: Diagnosis not present

## 2016-03-12 DIAGNOSIS — E785 Hyperlipidemia, unspecified: Secondary | ICD-10-CM

## 2016-03-12 DIAGNOSIS — M797 Fibromyalgia: Secondary | ICD-10-CM | POA: Diagnosis not present

## 2016-03-12 DIAGNOSIS — I10 Essential (primary) hypertension: Secondary | ICD-10-CM | POA: Diagnosis not present

## 2016-03-12 DIAGNOSIS — E559 Vitamin D deficiency, unspecified: Secondary | ICD-10-CM

## 2016-03-12 DIAGNOSIS — Z79899 Other long term (current) drug therapy: Secondary | ICD-10-CM | POA: Diagnosis not present

## 2016-03-12 DIAGNOSIS — F32A Depression, unspecified: Secondary | ICD-10-CM

## 2016-03-12 LAB — CBC WITH DIFFERENTIAL/PLATELET
Basophils Absolute: 0 cells/uL (ref 0–200)
Basophils Relative: 0 %
Eosinophils Absolute: 84 cells/uL (ref 15–500)
Eosinophils Relative: 1 %
HCT: 42.5 % (ref 35.0–45.0)
Hemoglobin: 14 g/dL (ref 11.7–15.5)
Lymphocytes Relative: 30 %
Lymphs Abs: 2520 cells/uL (ref 850–3900)
MCH: 29.9 pg (ref 27.0–33.0)
MCHC: 32.9 g/dL (ref 32.0–36.0)
MCV: 90.6 fL (ref 80.0–100.0)
MPV: 9.5 fL (ref 7.5–12.5)
Monocytes Absolute: 336 cells/uL (ref 200–950)
Monocytes Relative: 4 %
Neutro Abs: 5460 cells/uL (ref 1500–7800)
Neutrophils Relative %: 65 %
Platelets: 327 10*3/uL (ref 140–400)
RBC: 4.69 MIL/uL (ref 3.80–5.10)
RDW: 13.9 % (ref 11.0–15.0)
WBC: 8.4 10*3/uL (ref 3.8–10.8)

## 2016-03-12 LAB — HEMOGLOBIN A1C
Hgb A1c MFr Bld: 5.4 % (ref <5.7)
Mean Plasma Glucose: 108 mg/dL

## 2016-03-12 MED ORDER — CLONAZEPAM 2 MG PO TABS: 2.0000 mg | ORAL_TABLET | Freq: Three times a day (TID) | ORAL | 0 refills | Status: DC | PRN

## 2016-03-12 MED ORDER — FUROSEMIDE 20 MG PO TABS: 20.0000 mg | ORAL_TABLET | Freq: Every day | ORAL | 3 refills | Status: DC | PRN

## 2016-03-12 MED ORDER — AMPHETAMINE-DEXTROAMPHETAMINE 20 MG PO TABS: 20.0000 mg | ORAL_TABLET | Freq: Every day | ORAL | 0 refills | Status: DC

## 2016-03-12 NOTE — Progress Notes (Signed)
Assessment and Plan:   Hypertension -Continue medication, monitor blood pressure at home. Continue DASH diet.  Reminder to go to the ER if any CP, SOB, nausea, dizziness, severe HA, changes vision/speech, left arm numbness and tingling and jaw pain.  Cholesterol -Continue diet and exercise. Check cholesterol.    Prediabetes  -Continue diet and exercise. Check A1C  Vitamin D Def - check level and continue medications.   Morbid Obesity with co morbidities - long discussion about weight loss, diet, and exercise  Depression, unspecified depression type -     amphetamine-dextroamphetamine (ADDERALL) 20 MG tablet; Take 1 tablet (20 mg total) by mouth daily. -     clonazePAM (KLONOPIN) 2 MG tablet; Take 1 tablet (2 mg total) by mouth 3 (three) times daily as needed for anxiety.  Fibromyalgia -     amphetamine-dextroamphetamine (ADDERALL) 20 MG tablet; Take 1 tablet (20 mg total) by mouth daily. -     clonazePAM (KLONOPIN) 2 MG tablet; Take 1 tablet (2 mg total) by mouth 3 (three) times daily as needed for anxiety.  Medication management -     Magnesium  Continue diet and meds as discussed. Further disposition pending results of labs. Over 30 minutes of exam, counseling, chart review, and critical decision making was performed  Future Appointments Date Time Provider [place]  04/10/2016 9:15 AM Jessy Oto, MD [facility] None  04/18/2016 1:30 PM Elam Dutch, MD [facility]  08/14/2016 9:00 AM Vicie Mutters, PA-C [facility] None     HPI 55 y.o. female  presents for 3 month follow up on hypertension, cholesterol, prediabetes, and vitamin D deficiency.   She has a complicated history of OCD, depression, FM with chronic pain from cervical and lumbar DDD. She has been tried on several medications and has been told she needs to follow up with psych for further evaluation/help, she saw PA and counselor she went for 3 months about 2 x a week, and she was tried on several medications  that did not help. She was tried on latuda that she did not feel well with. She has stopped going due to cost. She stopped all of her medications and then went into a deep depression, she restarted her klonopin and adderall 1/2 in the AM and 1/2 in the PM. She is only on vitamin D, klonopin, lasix, gabapentin, mag, protonix, and adderall. She is off the lexapro, wellbutrin, and thyroid medication for 6 months or longer. She states she feels much better with just these medications.   Her blood pressure has been controlled at home, today their BP is BP: 122/84  She does not workout. She denies chest pain, shortness of breath, dizziness.  She is not on cholesterol medication and denies myalgias. Her cholesterol is not at goal. The cholesterol last visit was:   Lab Results  Component Value Date   CHOL 210 (H) 08/14/2015   HDL 58 08/14/2015   LDLCALC 136 (H) 08/14/2015   TRIG 79 08/14/2015   CHOLHDL 3.6 08/14/2015    She has been working on diet and exercise for prediabetes, and denies polydipsia, polyuria and visual disturbances. Last A1C in the office was:  Lab Results  Component Value Date   HGBA1C 6.1 (H) 08/14/2015   Patient is on Vitamin D supplement.   Lab Results  Component Value Date   VD25OH 21 08/14/2015    She was on thyroid medication, she has been off x 6 months Lab Results  Component Value Date   TSH 4.03  08/14/2015  .  BMI is Body mass index is 34.78 kg/m., she is working on diet and exercise. Wt Readings from Last 3 Encounters:  03/12/16 202 lb 9.6 oz (91.9 kg)  02/12/16 198 lb (89.8 kg)  11/24/15 198 lb 9.6 oz (90.1 kg)   Current Medications:  Current Outpatient Prescriptions on File Prior to Visit  Medication Sig Dispense Refill  . albuterol (VENTOLIN HFA) 108 (90 Base) MCG/ACT inhaler Inhale 2 puffs into the lungs every 4 (four) hours as needed for wheezing or shortness of breath. 1 Inhaler 3  . aspirin 81 MG tablet Take 81 mg by mouth daily.    .  cholecalciferol (VITAMIN D) 1000 UNITS tablet Take 7,000 Units by mouth at bedtime.     . furosemide (LASIX) 20 MG tablet Take 1-2 tablets (20-40 mg total) by mouth daily as needed for fluid. 30 tablet 3  . gabapentin (NEURONTIN) 600 MG tablet Take 1/2 to 1 tablet 3 to 4 x day for neuritis pains 120 tablet 0  . MAGNESIUM PO Take 450 mg by mouth daily.     No current facility-administered medications on file prior to visit.    Medical History:  Past Medical History:  Diagnosis Date  . ADD (attention deficit disorder)   . Anxiety   . Bladder neoplasm   . Cancer [facility]) 2015   Bladder cancer  . COPD (chronic obstructive pulmonary disease) ([place])   . Depression   . Fibroids   . Fibromyalgia   . H/O bronchitis    treated within the last week w steroids, antibx tessalon pearls.   . H/O sebaceous cyst 07/18/14   left breast   . Hemorrhoid   . History of melanoma excision    leg  . Hyperlipidemia   . Hypertension    normal now, only took medications for 1 month- thinks it was from pain.  Marland Kitchen Hypothyroidism   . Iron deficiency anemia   . PONV (postoperative nausea and vomiting)    used a scop patch last surg-did well  . Prediabetes   . Restless leg syndrome   . Shortness of breath dyspnea    with exertion  . Shoulder impingement    right  . SUI (stress urinary incontinence, female)   . Wears glasses    Allergies:  Allergies  Allergen Reactions  . Sulfa Antibiotics Hives and Itching     Review of Systems:  Review of Systems  Constitutional: Negative.   HENT: Negative for congestion, ear discharge, ear pain, hearing loss, nosebleeds, sore throat and tinnitus.   Eyes: Negative.   Respiratory: Negative for cough, hemoptysis, sputum production, shortness of breath, wheezing and stridor.   Cardiovascular: Negative for chest pain, palpitations, orthopnea, claudication, leg swelling and PND.  Gastrointestinal: Negative for abdominal pain, blood in stool, constipation, diarrhea,  heartburn, melena, nausea and vomiting.  Genitourinary: Negative.   Musculoskeletal: Positive for back pain, joint pain, myalgias and neck pain. Negative for falls.  Skin: Negative.   Neurological: Negative.  Negative for headaches.  Endo/Heme/Allergies: Negative.   Psychiatric/Behavioral: Positive for depression and memory loss. Negative for hallucinations, substance abuse and suicidal ideas. The patient is nervous/anxious. The patient does not have insomnia.     Family history- Review and unchanged Social history- Review and unchanged Physical Exam: BP 122/84   Pulse 84   Temp 97.3 F (36.3 C)   Resp 16   Ht 5\' 4"  (1.626 m)   Wt 202 lb 9.6 oz (91.9 kg)   SpO2 96%  BMI 34.78 kg/m   Wt Readings from Last 3 Encounters:  03/12/16 202 lb 9.6 oz (91.9 kg)  02/12/16 198 lb (89.8 kg)  11/24/15 198 lb 9.6 oz (90.1 kg)   General Appearance: Well nourished, in no apparent distress. Eyes: PERRLA, EOMs, conjunctiva no swelling or erythema Sinuses: No Frontal/maxillary tenderness ENT/Mouth: Ext aud canals clear, TMs without erythema, bulging. No erythema, swelling, or exudate on post pharynx.  Tonsils not swollen or erythematous. Hearing normal.  Neck: Supple, thyroid normal.  Respiratory: Respiratory effort normal, BS equal bilaterally without rales, rhonchi, wheezing or stridor.  Cardio: RRR with no MRGs. Brisk peripheral pulses without edema.  Abdomen: Soft, + BS,  Non tender, no guarding, rebound, hernias, masses. Lymphatics: Non tender without lymphadenopathy.  Musculoskeletal: Full ROM, 5/5 strength, Antalgic gait Skin: Warm, dry without rashes, lesions, ecchymosis.  Neuro: Cranial nerves intact. Normal muscle tone, no cerebellar symptoms. Psych: Awake and oriented X 3, normal affect, Insight and Judgment appropriate.    Vicie Mutters, PA-C 10:05 AM [facility]

## 2016-03-12 NOTE — Patient Instructions (Signed)
Before you even begin to attack a weight-loss plan, it pays to remember this: You are not fat. You have fat. Losing weight isn't about blame or shame; it's simply another achievement to accomplish. Dieting is like any other skill-you have to buckle down and work at it. As long as you act in a smart, reasonable way, you'll ultimately get where you want to be. Here are some weight loss pearls for you.  1. It's Not a Diet. It's a Lifestyle Thinking of a diet as something you're on and suffering through only for the short term doesn't work. To shed weight and keep it off, you need to make permanent changes to the way you eat. It's OK to indulge occasionally, of course, but if you cut calories temporarily and then revert to your old way of eating, you'll gain back the weight quicker than you can say yo-yo. Use it to lose it. Research shows that one of the best predictors of long-term weight loss is how many pounds you drop in the first month. For that reason, nutritionists often suggest being stricter for the first two weeks of your new eating strategy to build momentum. Cut out added sugar and alcohol and avoid unrefined carbs. After that, figure out how you can reincorporate them in a way that's healthy and maintainable.  2. There's a Right Way to Exercise Working out burns calories and fat and boosts your metabolism by building muscle. But those trying to lose weight are notorious for overestimating the number of calories they burn and underestimating the amount they take in. Unfortunately, your system is biologically programmed to hold on to extra pounds and that means when you start exercising, your body senses the deficit and ramps up its hunger signals. If you're not diligent, you'll eat everything you burn and then some. Use it to lose it. Cardio gets all the exercise glory, but strength and interval training are the real heroes. They help you build lean muscle, which in turn increases your metabolism and  calorie-burning ability 3. Don't Overreact to Mild Hunger Some people have a hard time losing weight because of hunger anxiety. To them, being hungry is bad-something to be avoided at all costs-so they carry snacks with them and eat when they don't need to. Others eat because they're stressed out or bored. While you never want to get to the point of being ravenous (that's when bingeing is likely to happen), a hunger pang, a craving, or the fact that it's 3:00 p.m. should not send you racing for the vending machine or obsessing about the energy bar in your purse. Ideally, you should put off eating until your stomach is growling and it's difficult to concentrate.  Use it to lose it. When you feel the urge to eat, use the HALT method. Ask yourself, Am I really hungry? Or am I angry or anxious, lonely or bored, or tired? If you're still not certain, try the apple test. If you're truly hungry, an apple should seem delicious; if it doesn't, something else is going on. Or you can try drinking water and making yourself busy, if you are still hungry try a healthy snack.  4. Not All Calories Are Created Equal The mechanics of weight loss are pretty simple: Take in fewer calories than you use for energy. But the kind of food you eat makes all the difference. Processed food that's high in saturated fat and refined starch or sugar can cause inflammation that disrupts the hormone signals that tell   your brain you're full. The result: You eat a lot more.  Use it to lose it. Clean up your diet. Swap in whole, unprocessed foods, including vegetables, lean protein, and healthy fats that will fill you up and give you the biggest nutritional bang for your calorie buck. In a few weeks, as your brain starts receiving regular hunger and fullness signals once again, you'll notice that you feel less hungry overall and naturally start cutting back on the amount you eat.  5. Protein, Produce, and Plant-Based Fats Are Your Weight-Loss  [place] Here's why eating the three Ps regularly will help you drop pounds. Protein fills you up. You need it to build lean muscle, which keeps your metabolism humming so that you can torch more fat. People in a weight-loss program who ate double the recommended daily allowance for protein (about 110 grams for a 150-pound woman) lost 70 percent of their weight from fat, while people who ate the RDA lost only about 40 percent, one study found. Produce is packed with filling fiber. "It's very difficult to consume too many calories if you're eating a lot of vegetables. Example: Three cups of broccoli is a lot of food, yet only 93 calories. (Fruit is another story. It can be easy to overeat and can contain a lot of calories from sugar, so be sure to monitor your intake.) Plant-based fats like olive oil and those in avocados and nuts are healthy and extra satiating.  Use it to lose it. Aim to incorporate each of the three Ps into every meal and snack. People who eat protein throughout the day are able to keep weight off, according to a study in the American Journal of Clinical Nutrition. In addition to meat, poultry and seafood, good sources are beans, lentils, eggs, tofu, and yogurt. As for fat, keep portion sizes in check by measuring out salad dressing, oil, and nut butters (shoot for one to two tablespoons). Finally, eat veggies or a little fruit at every meal. People who did that consumed 308 fewer calories but didn't feel any hungrier than when they didn't eat more produce.  7. How You Eat Is As Important As What You Eat In order for your brain to register that you're full, you need to focus on what you're eating. Sit down whenever you eat, preferably at a table. Turn off the TV or computer, put down your phone, and look at your food. Smell it. Chew slowly, and don't put another bite on your fork until you swallow. When women ate lunch this attentively, they consumed 30 percent less when snacking later than  those who listened to an audiobook at lunchtime, according to a study in the [employer] Journal of Nutrition. 8. Weighing Yourself Really Works The scale provides the best evidence about whether your efforts are paying off. Seeing the numbers tick up or down or stagnate is motivation to keep going-or to rethink your approach. A 2015 study at [employer] found that daily weigh-ins helped people lose more weight, keep it off, and maintain that loss, even after two years. Use it to lose it. Step on the scale at the same time every day for the best results. If your weight shoots up several pounds from one weigh-in to the next, don't freak out. Eating a lot of salt the night before or having your period is the likely culprit. The number should return to normal in a day or two. It's a steady climb that you need to do something about.   9. Too Much Stress and Too Little Sleep Are Your Enemies When you're tired and frazzled, your body cranks up the production of cortisol, the stress hormone that can cause carb cravings. Not getting enough sleep also boosts your levels of ghrelin, a hormone associated with hunger, while suppressing leptin, a hormone that signals fullness and satiety. People on a diet who slept only five and a half hours a night for two weeks lost 55 percent less fat and were hungrier than those who slept eight and a half hours, according to a study in the [employer] Journal. Use it to lose it. Prioritize sleep, aiming for seven hours or more a night, which research shows helps lower stress. And make sure you're getting quality zzz's. If a snoring spouse or a fidgety cat wakes you up frequently throughout the night, you may end up getting the equivalent of just four hours of sleep, according to a study from [employer]. Keep pets out of the bedroom, and use a white-noise app to drown out snoring. 10. You Will Hit a plateau-And You Can Bust Through It As you slim down, your  body releases much less leptin, the fullness hormone.  If you're not strength training, start right now. Building muscle can raise your metabolism to help you overcome a plateau. To keep your body challenged and burning calories, incorporate new moves and more intense intervals into your workouts or add another sweat session to your weekly routine. Alternatively, cut an extra 100 calories or so a day from your diet. Now that you've lost weight, your body simply doesn't need as much fuel.   

## 2016-03-13 LAB — HEPATIC FUNCTION PANEL
ALT: 20 U/L (ref 6–29)
AST: 20 U/L (ref 10–35)
Albumin: 4.1 g/dL (ref 3.6–5.1)
Alkaline Phosphatase: 86 U/L (ref 33–130)
Bilirubin, Direct: 0.1 mg/dL (ref <=0.2)
Indirect Bilirubin: 0.3 mg/dL (ref 0.2–1.2)
Total Bilirubin: 0.4 mg/dL (ref 0.2–1.2)
Total Protein: 6.7 g/dL (ref 6.1–8.1)

## 2016-03-13 LAB — LIPID PANEL
Cholesterol: 193 mg/dL (ref <200)
HDL: 61 mg/dL (ref >50)
LDL Cholesterol: 114 mg/dL — ABNORMAL HIGH (ref <100)
Total CHOL/HDL Ratio: 3.2 Ratio (ref <5.0)
Triglycerides: 91 mg/dL (ref <150)
VLDL: 18 mg/dL (ref <30)

## 2016-03-13 LAB — MAGNESIUM: Magnesium: 2.1 mg/dL (ref 1.5–2.5)

## 2016-03-13 LAB — BASIC METABOLIC PANEL WITH GFR
BUN: 17 mg/dL (ref 7–25)
CO2: 27 mmol/L (ref 20–31)
Calcium: 9.4 mg/dL (ref 8.6–10.4)
Chloride: 105 mmol/L (ref 98–110)
Creat: 0.75 mg/dL (ref 0.50–1.05)
GFR, Est African American: >89 mL/min (ref >=60)
GFR, Est Non African American: 89 mL/min (ref >=60)
Glucose, Bld: 89 mg/dL (ref 65–99)
Potassium: 4.2 mmol/L (ref 3.5–5.3)
Sodium: 141 mmol/L (ref 135–146)

## 2016-03-13 LAB — TSH: TSH: 3.43 mIU/L

## 2016-04-10 ENCOUNTER — Ambulatory Visit (INDEPENDENT_AMBULATORY_CARE_PROVIDER_SITE_OTHER): Payer: Managed Care, Other (non HMO) | Admitting: Specialist

## 2016-04-15 ENCOUNTER — Encounter: Payer: Self-pay | Admitting: Vascular Surgery

## 2016-04-16 ENCOUNTER — Telehealth (INDEPENDENT_AMBULATORY_CARE_PROVIDER_SITE_OTHER): Payer: Self-pay | Admitting: Surgery

## 2016-04-18 ENCOUNTER — Ambulatory Visit (INDEPENDENT_AMBULATORY_CARE_PROVIDER_SITE_OTHER): Payer: Managed Care, Other (non HMO) | Admitting: Vascular Surgery

## 2016-04-18 ENCOUNTER — Encounter (HOSPITAL_COMMUNITY): Payer: Managed Care, Other (non HMO)

## 2016-04-18 ENCOUNTER — Encounter: Payer: Self-pay | Admitting: Vascular Surgery

## 2016-04-18 VITALS — BP 139/83 | HR 86 | Temp 98.9°F | Resp 16 | Ht 64.0 in | Wt 207.0 lb

## 2016-04-18 DIAGNOSIS — I83813 Varicose veins of bilateral lower extremities with pain: Secondary | ICD-10-CM

## 2016-04-18 NOTE — Progress Notes (Signed)
Referring Physician: Basil Dess, MD Patient name: [name] MRN: [id number] DOB: 23-Sep-1960 Sex: female  REASON FOR CONSULT: right leg pain  HPI: [name] is a 56 y.o. female with a several months history of pain and burning in the right lateral to posterior thigh. The patient states it is difficult for her to sit as weightbearing on the posterior portion of her thigh causes this burning sensation. She occasionally gets some swelling in her right leg. She states she is also had pain in her right foot for the last 2 months. She has previously undergone a right ankle fusion. She denies prior history of DVT. She has radiation of the pain from the right lateral 5 and the posterior calf. She does not really describe claudication-type symptoms. She has no nonhealing wounds. She is a former smoker Other medical problems include COPD, fibromyalgia, hyperlipidemia, hypertension all of which have been stable.  Past Medical History:  Diagnosis Date  . ADD (attention deficit disorder)   . Anxiety   . Bladder neoplasm   . Cancer [facility]) 2015   Bladder cancer  . COPD (chronic obstructive pulmonary disease) ([place])   . Depression   . Fibroids   . Fibromyalgia   . H/O bronchitis    treated within the last week w steroids, antibx tessalon pearls.   . H/O sebaceous cyst 07/18/14   left breast   . Hemorrhoid   . History of melanoma excision    leg  . Hyperlipidemia   . Hypertension    normal now, only took medications for 1 month- thinks it was from pain.  Marland Kitchen Hypothyroidism   . Iron deficiency anemia   . PONV (postoperative nausea and vomiting)    used a scop patch last surg-did well  . Prediabetes   . Restless leg syndrome   . Shortness of breath dyspnea    with exertion  . Shoulder impingement    right  . SUI (stress urinary incontinence, female)   . Wears glasses    Past Surgical History:  Procedure Laterality Date  . ABDOMINAL HYSTERECTOMY    . ANKLE FUSION Right 2011  .  ANTERIOR CERVICAL DECOMP/DISCECTOMY FUSION N/A 09/01/2013   Procedure: Anterior cervical decompression fusion, cervical 5-6 with instrumentation and allograft;  Surgeon: Sinclair Ship, MD;  Location: [place];  Service: Orthopedics;  Laterality: N/A;  Anterior cervical decompression fusion, cervical 5-6 with instrumentation and allograft  . ANTERIOR CERVICAL DECOMP/DISCECTOMY FUSION N/A 08/02/2015   Procedure: ANTERIOR CERVICAL DECOMPRESSION FUSION CERVICAL 6 - THORACIC 1 WITH INSTRUMENTATION AND ALLOGRAFT;  Surgeon: Phylliss Bob, MD;  Location: [place];  Service: Orthopedics;  Laterality: N/A;  ANTERIOR CERVICAL DECOMPRESSION FUSION, CERVICAL 6-7,CERVICAL 7-THORACIC 7 WITH INSTRUMENTATION AND ALLOGRAFT  . CATARACT EXTRACTION W/ INTRAOCULAR LENS IMPLANT Bilateral   . CLOSED REDUCTION AND PINNING LEFT FIFTH  PROXIMAL PHALANGEAL FX  10-09-2005  . CYSTOSCOPY W/ RETROGRADES Left 01/11/2014   Procedure: CYSTOSCOPY WITH RETROGRADE PYELOGRAM;  Surgeon: Festus Aloe, MD;  Location: [facility];  Service: Urology;  Laterality: Left;  . CYSTOSCOPY WITH STENT PLACEMENT Left 01/11/2014   Procedure: CYSTOSCOPY WITH STENT PLACEMENT;  Surgeon: Festus Aloe, MD;  Location: [facility];  Service: Urology;  Laterality: Left;  . [place] ENDOMETRIAL ABLATION  07-16-2006  . KNEE ARTHROSCOPY WITH MEDIAL MENISECTOMY Right 05/22/2012   Procedure: KNEE ARTHROSCOPY WITH MEDIAL MENISECTOMY and Medial Plica excision;  Surgeon: Alta [place], MD;  Location: [place];  Service: Orthopedics;  Laterality: Right;  . LAPAROSCOPIC ASSISTED VAGINAL HYSTERECTOMY N/A 08/29/2014   Procedure: LAPAROSCOPIC ASSISTED VAGINAL HYSTERECTOMY;  Surgeon: Molli Posey, MD;  Location: [place];  Service: Gynecology;  Laterality: N/A;  . LAPAROSCOPIC CHOLECYSTECTOMY  2005  . LAPAROTOMY N/A 08/29/2014   Procedure: EXPLORATORY LAPAROTOMY WITH LIGATION OF  BLEEDER;  Surgeon: Molli Posey, MD;  Location: [facility];  Service: Gynecology;  Laterality: N/A;  . POSTERIOR CERVICAL FUSION/FORAMINOTOMY N/A 01/25/2015   Procedure: POSTERIOR CERVICAL DECOMPRESSSION CERVICAL 7-THORACIC 1;  Surgeon: Phylliss Bob, MD;  Location: [place];  Service: Orthopedics;  Laterality: N/A;  Posterior cervical decompression, cervical 5-6, cervical 6-7, cervical 7-thoracic 1   . PUBOVAGINAL SLING N/A 08/29/2014   Procedure: Okey Dupre SINGLE INCISION SLING;  Surgeon: Molli Posey, MD;  Location: [facility];  Service: Gynecology;  Laterality: N/A;  . SALPINGOOPHORECTOMY Bilateral 08/29/2014   Procedure: SALPINGO OOPHORECTOMY;  Surgeon: Molli Posey, MD;  Location: [facility];  Service: Gynecology;  Laterality: Bilateral;  . SHOULDER ARTHROSCOPY W/ SUBACROMIAL DECOMPRESSION AND DISTAL CLAVICLE EXCISION Left 11-27-2011   W/  DEBRIDEMENT  . SHOULDER ARTHROSCOPY WITH BICEPSTENOTOMY Right 12/21/2014   Procedure: SHOULDER ARTHROSCOPY WITH BICEPSTENOTOMY;  Surgeon: Dorna Leitz, MD;  Location: [place];  Service: Orthopedics;  Laterality: Right;  . SHOULDER ARTHROSCOPY WITH DISTAL CLAVICLE RESECTION Right 12/21/2014   Procedure: SHOULDER ARTHROSCOPY WITH DISTAL CLAVICLE RESECTION;  Surgeon: Dorna Leitz, MD;  Location: [place];  Service: Orthopedics;  Laterality: Right;  . SHOULDER ARTHROSCOPY WITH OPEN ROTATOR CUFF REPAIR Right 07/12/2015   Procedure: SHOULDER ARTHROSCOPY WITH ACROMIOPLASTY AND OPEN ROTATOR CUFF REPAIR;  Surgeon: Dorna Leitz, MD;  Location: [place];  Service: Orthopedics;  Laterality: Right;  . SHOULDER ARTHROSCOPY WITH SUBACROMIAL DECOMPRESSION Left 11/10/2013   Procedure: SHOULDER ARTHROSCOPY WITH SUBACROMIAL DECOMPRESSION WITH ROTATOR CUFF REPAIR AND BICEPS TENODESIS;  Surgeon: Alta [place], MD;  Location: [place];  Service: Orthopedics;  Laterality: Left;  . SHOULDER  ARTHROSCOPY WITH SUBACROMIAL DECOMPRESSION Right 12/21/2014   Procedure: SHOULDER ARTHROSCOPY WITH SUBACROMIAL DECOMPRESSION;  Surgeon: Dorna Leitz, MD;  Location: [place];  Service: Orthopedics;  Laterality: Right;  . TONSILLECTOMY  age 59  . TRANSURETHRAL RESECTION OF BLADDER TUMOR N/A 01/11/2014   Procedure: TRANSURETHRAL RESECTION OF BLADDER TUMOR (TURBT);  Surgeon: Festus Aloe, MD;  Location: [facility];  Service: Urology;  Laterality: N/A;  . TUBAL LIGATION  1980    Family History  Problem Relation Age of Onset  . Cancer Father     Lung and stomach   . Heart disease Father   . Hypertension Father   . Stroke Father   . Heart failure Paternal Grandfather   . Cancer Paternal Grandfather     colon cancer  . Ovarian cancer Mother 61  . Breast cancer Mother     dx in her late 38s-50s  . Thyroid cancer Mother   . Ovarian cancer Sister 45  . Cancer Brother     abdominal tumor  . Ovarian cancer Maternal Aunt   . Breast cancer Maternal Aunt   . Breast cancer Maternal Uncle   . Breast cancer Paternal Aunt   . Ovarian cancer Sister 35  . Ovarian cancer Sister 106  . Cancer Brother     abdominal tumor  . Breast cancer Paternal Aunt   . Breast cancer Paternal Aunt   . Breast cancer Paternal Aunt   . Breast cancer Cousin   .  Breast cancer Cousin     SOCIAL HISTORY: Social History   Social History  . Marital status: Married    Spouse name: N/A  . Number of children: 2  . Years of education: N/A   Occupational History  . logistic specialist [employer]    Social History Main Topics  . Smoking status: Former Smoker    Packs/day: 1.00    Years: 30.00    Types: Cigarettes    Quit date: 06/24/2009  . Smokeless tobacco: Never Used  . Alcohol use No  . Drug use: No  . Sexual activity: Not on file   Other Topics Concern  . Not on file   Social History Narrative   Lives with husband in a one story home.  Has 2 children.       Not currently working.  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   Trying to get disability.     Education: 10th grade.    Allergies  Allergen Reactions  . Sulfa Antibiotics Hives and Itching    Current Outpatient Prescriptions  Medication Sig Dispense Refill  . albuterol (VENTOLIN HFA) 108 (90 Base) MCG/ACT inhaler Inhale 2 puffs into the lungs every 4 (four) hours as needed for wheezing or shortness of breath. 1 Inhaler 3  . amphetamine-dextroamphetamine (ADDERALL) 20 MG tablet Take 1 tablet (20 mg total) by mouth daily. 30 tablet 0  . aspirin 81 MG tablet Take 81 mg by mouth daily.    . cholecalciferol (VITAMIN D) 1000 UNITS tablet Take 7,000 Units by mouth at bedtime.     . clonazePAM (KLONOPIN) 2 MG tablet Take 1 tablet (2 mg total) by mouth 3 (three) times daily as needed for anxiety. 90 tablet 0  . furosemide (LASIX) 20 MG tablet Take 1-2 tablets (20-40 mg total) by mouth daily as needed for fluid. 30 tablet 3  . gabapentin (NEURONTIN) 600 MG tablet Take 1/2 to 1 tablet 3 to 4 x day for neuritis pains 120 tablet 0  . MAGNESIUM PO Take 450 mg by mouth daily.     No current facility-administered medications for this visit.     ROS:   General:  No weight loss, Fever, chills  HEENT: No recent headaches, no nasal bleeding, no visual changes, no sore throat  Neurologic: No dizziness, blackouts, seizures. No recent symptoms of stroke or mini- stroke. No recent episodes of slurred speech, or temporary blindness.  Cardiac: No recent episodes of chest pain/pressure, no shortness of breath at rest.  No shortness of breath with exertion.  Denies history of atrial fibrillation or irregular heartbeat  Vascular: No history of rest pain in feet.  No history of claudication.  No history of non-healing ulcer, No history of DVT   Pulmonary: No home oxygen, no productive cough, no hemoptysis,  No asthma or wheezing  Musculoskeletal:  [X]  Arthritis, [X]  Low back pain,   [X]  Joint pain  Hematologic:No history of hypercoagulable state.  No history of easy bleeding.  No history of anemia  Gastrointestinal: No hematochezia or melena,  No gastroesophageal reflux, no trouble swallowing  Urinary: [ ]  chronic Kidney disease, [ ]  on HD - [ ]  MWF or [ ]  TTHS, [ ]  Burning with urination, [ ]  Frequent urination, [ ]  Difficulty urinating;   Skin: No rashes  Psychological: No history of anxiety,  No history of depression   Physical Examination  Vitals:   04/18/16 1321  BP: 139/83  Pulse: 86  Resp: 16  Temp: 98.9 F (37.2 C)  TempSrc: Oral  SpO2: 98%  Weight: 207 lb (93.9 kg)  Height: 5\' 4"  (1.626 m)    Body mass index is 35.53 kg/m.  General:  Alert and oriented, no acute distress HEENT: Normal Neck: No bruit or JVD Pulmonary: Clear to auscultation bilaterally Cardiac: Regular Rate and Rhythm without murmur Abdomen: Soft, non-tender, non-distended, no mass, no scars Skin: No rash, scattered reticular type varicosities right posterior lateral thigh left posterior calf Extremity Pulses:  2+ radial, brachial, femoral, 1+ dorsalis pedis left foot, 1+ posterior tibial pulse right foot Musculoskeletal: No deformity or edema  Neurologic: Upper and lower extremity motor 5/5 and symmetric  DATA:  Patient had bilateral ABIs performed at [facility]/14/2015 these were greater than 1 normal bilaterally  ASSESSMENT:  Right posterior and lateral thigh pain history sounds more like neurologic symptoms rather than a vascular etiology. She does have some reticular type varicosities in the right posterior lateral thigh. These currently are not inflamed but could be part of her problem. She does have palpable pulses in her feet bilaterally but since her last ABIs were almost 1 year ago it is worthwhile to repeat these.   PLAN:  Patient will follow-up with me in 2-3 weeks with a bilateral ABI and bilateral venous duplex.  Depending on the results of the studies we  will decide whether or not this may be related to her pain symptoms or whether or not this is more neurologic/musculoskeletal in nature.   Ruta Hinds, MD [facility] Office: [phone] Pager: [phone]

## 2016-04-19 NOTE — Addendum Note (Signed)
Addended by: Lianne Cure A on: 04/19/2016 10:32 AM   Modules accepted: Orders

## 2016-04-23 ENCOUNTER — Telehealth: Payer: Self-pay | Admitting: *Deleted

## 2016-04-23 NOTE — Telephone Encounter (Signed)
Received My Chart message with patient stating that she had been referred to [facility] for leg pain.  She was under the impression that an ultrasound would be done at this time.  After being seen by Dr Oneida Alar, patient was told to return in 2 to 3 weeks.  Patient is stating that she will probably cancel these appointments due to the cost involved.

## 2016-05-08 ENCOUNTER — Ambulatory Visit (INDEPENDENT_AMBULATORY_CARE_PROVIDER_SITE_OTHER): Payer: Managed Care, Other (non HMO) | Admitting: Specialist

## 2016-05-09 ENCOUNTER — Other Ambulatory Visit: Payer: Self-pay | Admitting: Physician Assistant

## 2016-05-09 DIAGNOSIS — F32A Depression, unspecified: Secondary | ICD-10-CM

## 2016-05-09 DIAGNOSIS — F329 Major depressive disorder, single episode, unspecified: Secondary | ICD-10-CM

## 2016-05-09 DIAGNOSIS — M797 Fibromyalgia: Secondary | ICD-10-CM

## 2016-05-09 MED ORDER — AMPHETAMINE-DEXTROAMPHETAMINE 20 MG PO TABS
20.0000 mg | ORAL_TABLET | Freq: Every day | ORAL | 0 refills | Status: DC
Start: 2016-05-09 — End: 2016-07-11

## 2016-05-09 MED ORDER — CLONAZEPAM 2 MG PO TABS: 2.0000 mg | ORAL_TABLET | Freq: Three times a day (TID) | ORAL | 0 refills | Status: DC | PRN

## 2016-05-16 ENCOUNTER — Encounter: Payer: Self-pay | Admitting: Vascular Surgery

## 2016-05-21 ENCOUNTER — Ambulatory Visit (INDEPENDENT_AMBULATORY_CARE_PROVIDER_SITE_OTHER)
Admission: RE | Admit: 2016-05-21 | Discharge: 2016-05-21 | Disposition: A | Discharge status: Home / Self Care | Payer: Managed Care, Other (non HMO) | Source: Ambulatory Visit | Attending: Vascular Surgery | Admitting: Vascular Surgery

## 2016-05-21 ENCOUNTER — Ambulatory Visit (HOSPITAL_COMMUNITY)
Admission: RE | Admit: 2016-05-21 | Discharge: 2016-05-21 | Disposition: A | Discharge status: Home / Self Care | Payer: Managed Care, Other (non HMO) | Source: Ambulatory Visit | Attending: Vascular Surgery | Admitting: Vascular Surgery

## 2016-05-21 DIAGNOSIS — I83813 Varicose veins of bilateral lower extremities with pain: Secondary | ICD-10-CM | POA: Insufficient documentation

## 2016-05-23 ENCOUNTER — Encounter: Payer: Self-pay | Admitting: Vascular Surgery

## 2016-05-23 ENCOUNTER — Ambulatory Visit (INDEPENDENT_AMBULATORY_CARE_PROVIDER_SITE_OTHER): Payer: Managed Care, Other (non HMO) | Admitting: Vascular Surgery

## 2016-05-23 VITALS — BP 115/71 | HR 82 | Temp 98.5°F | Resp 16 | Ht 64.0 in | Wt 199.0 lb

## 2016-05-23 DIAGNOSIS — M79604 Pain in right leg: Secondary | ICD-10-CM | POA: Diagnosis not present

## 2016-05-23 NOTE — Progress Notes (Signed)
Referring Physician: Basil Dess, MD Patient name: [name]          MRN: [id number]        DOB: [birth year]          Sex: female   REASON FOR CONSULT: right leg pain   HPI: [name] is a 56 y.o. female with a several months history of pain and burning in the right lateral to posterior thigh. The patient states it is difficult for her to sit as weightbearing on the posterior portion of her thigh causes this burning sensation. She occasionally gets some swelling in her right leg. She states she is also had pain in her right foot for the last few months. She has previously undergone a right ankle fusion. She denies prior history of DVT. She has radiation of the pain from the right lateral 5 and the posterior calf. She does not really describe claudication-type symptoms. She has no nonhealing wounds. She is a former smoker Other medical problems include COPD, fibromyalgia, hyperlipidemia, hypertension all of which have been stable.  She was seen about a month ago and returns today with noninvasive arterial and venous duplex testing.       Past Medical History:  Diagnosis Date  . ADD (attention deficit disorder)    . Anxiety    . Bladder neoplasm    . Cancer [facility]) 2015    Bladder cancer  . COPD (chronic obstructive pulmonary disease) ([place])    . Depression    . Fibroids    . Fibromyalgia    . H/O bronchitis      treated within the last week w steroids, antibx tessalon pearls.   . H/O sebaceous cyst 07/18/14    left breast   . Hemorrhoid    . History of melanoma excision      leg  . Hyperlipidemia    . Hypertension      normal now, only took medications for 1 month- thinks it was from pain.  Marland Kitchen Hypothyroidism    . Iron deficiency anemia    . PONV (postoperative nausea and vomiting)      used a scop patch last surg-did well  . Prediabetes    . Restless leg syndrome    . Shortness of breath dyspnea      with exertion  . Shoulder impingement      right  . SUI (stress  urinary incontinence, female)    . Wears glasses           Past Surgical History:  Procedure Laterality Date  . ABDOMINAL HYSTERECTOMY      . ANKLE FUSION Right 2011  . ANTERIOR CERVICAL DECOMP/DISCECTOMY FUSION N/A 09/01/2013    Procedure: Anterior cervical decompression fusion, cervical 5-6 with instrumentation and allograft;  Surgeon: Sinclair Ship, MD;  Location: [place];  Service: Orthopedics;  Laterality: N/A;  Anterior cervical decompression fusion, cervical 5-6 with instrumentation and allograft  . ANTERIOR CERVICAL DECOMP/DISCECTOMY FUSION N/A 08/02/2015    Procedure: ANTERIOR CERVICAL DECOMPRESSION FUSION CERVICAL 6 - THORACIC 1 WITH INSTRUMENTATION AND ALLOGRAFT;  Surgeon: Phylliss Bob, MD;  Location: [place];  Service: Orthopedics;  Laterality: N/A;  ANTERIOR CERVICAL DECOMPRESSION FUSION, CERVICAL 6-7,CERVICAL 7-THORACIC 7 WITH INSTRUMENTATION AND ALLOGRAFT  . CATARACT EXTRACTION W/ INTRAOCULAR LENS IMPLANT Bilateral    . CLOSED REDUCTION AND PINNING LEFT FIFTH  PROXIMAL PHALANGEAL FX   10-09-2005  . CYSTOSCOPY W/ RETROGRADES Left 01/11/2014    Procedure: CYSTOSCOPY WITH RETROGRADE PYELOGRAM;  Surgeon: Festus Aloe, MD;  Location: [facility];  Service: Urology;  Laterality: Left;  . CYSTOSCOPY WITH STENT PLACEMENT Left 01/11/2014    Procedure: CYSTOSCOPY WITH STENT PLACEMENT;  Surgeon: Festus Aloe, MD;  Location: [facility];  Service: Urology;  Laterality: Left;  . [place] ENDOMETRIAL ABLATION   07-16-2006  . KNEE ARTHROSCOPY WITH MEDIAL MENISECTOMY Right 05/22/2012    Procedure: KNEE ARTHROSCOPY WITH MEDIAL MENISECTOMY and Medial Plica excision;  Surgeon: Alta [place], MD;  Location: [place];  Service: Orthopedics;  Laterality: Right;  . LAPAROSCOPIC ASSISTED VAGINAL HYSTERECTOMY N/A 08/29/2014    Procedure: LAPAROSCOPIC ASSISTED VAGINAL HYSTERECTOMY;  Surgeon: Molli Posey, MD;  Location:  [place];  Service: Gynecology;  Laterality: N/A;  . LAPAROSCOPIC CHOLECYSTECTOMY   2005  . LAPAROTOMY N/A 08/29/2014    Procedure: EXPLORATORY LAPAROTOMY WITH LIGATION OF BLEEDER;  Surgeon: Molli Posey, MD;  [facility];  Service: Gynecology;  Laterality: N/A;  . POSTERIOR CERVICAL FUSION/FORAMINOTOMY N/A 01/25/2015    Procedure: POSTERIOR CERVICAL DECOMPRESSSION CERVICAL 7-THORACIC 1;  Surgeon: Phylliss Bob, MD;  Location: [place];  Service: Orthopedics;  Laterality: N/A;  Posterior cervical decompression, cervical 5-6, cervical 6-7, cervical 7-thoracic 1   . PUBOVAGINAL SLING N/A 08/29/2014    Procedure: Okey Dupre SINGLE INCISION SLING;  Surgeon: Molli Posey, MD;  Location: [facility];  Service: Gynecology;  Laterality: N/A;  . SALPINGOOPHORECTOMY Bilateral 08/29/2014    Procedure: SALPINGO OOPHORECTOMY;  Surgeon: Molli Posey, MD;  Location: [facility];  Service: Gynecology;  Laterality: Bilateral;  . SHOULDER ARTHROSCOPY W/ SUBACROMIAL DECOMPRESSION AND DISTAL CLAVICLE EXCISION Left 11-27-2011    W/  DEBRIDEMENT  . SHOULDER ARTHROSCOPY WITH BICEPSTENOTOMY Right 12/21/2014    Procedure: SHOULDER ARTHROSCOPY WITH BICEPSTENOTOMY;  Surgeon: Dorna Leitz, MD;  Location: [place];  Service: Orthopedics;  Laterality: Right;  . SHOULDER ARTHROSCOPY WITH DISTAL CLAVICLE RESECTION Right 12/21/2014    Procedure: SHOULDER ARTHROSCOPY WITH DISTAL CLAVICLE RESECTION;  Surgeon: Dorna Leitz, MD;  Location: [place];  Service: Orthopedics;  Laterality: Right;  . SHOULDER ARTHROSCOPY WITH OPEN ROTATOR CUFF REPAIR Right 07/12/2015    Procedure: SHOULDER ARTHROSCOPY WITH ACROMIOPLASTY AND OPEN ROTATOR CUFF REPAIR;  Surgeon: Dorna Leitz, MD;  Location: [place];  Service: Orthopedics;  Laterality: Right;  . SHOULDER ARTHROSCOPY WITH SUBACROMIAL DECOMPRESSION Left 11/10/2013    Procedure: SHOULDER ARTHROSCOPY WITH  SUBACROMIAL DECOMPRESSION WITH ROTATOR CUFF REPAIR AND BICEPS TENODESIS;  Surgeon: Alta [place], MD;  Location: [place];  Service: Orthopedics;  Laterality: Left;  . SHOULDER ARTHROSCOPY WITH SUBACROMIAL DECOMPRESSION Right 12/21/2014    Procedure: SHOULDER ARTHROSCOPY WITH SUBACROMIAL DECOMPRESSION;  Surgeon: Dorna Leitz, MD;  Location: [place];  Service: Orthopedics;  Laterality: Right;  . TONSILLECTOMY   age 70  . TRANSURETHRAL RESECTION OF BLADDER TUMOR N/A 01/11/2014    Procedure: TRANSURETHRAL RESECTION OF BLADDER TUMOR (TURBT);  Surgeon: Festus Aloe, MD;  Location[facility];  Service: Urology;  Laterality: N/A;  . TUBAL LIGATION   1980            Family History  Problem Relation Age of Onset  . Cancer Father        Lung and stomach   . Heart disease Father    . Hypertension Father    . Stroke Father    . Heart failure Paternal Grandfather    . Cancer Paternal  Grandfather        colon cancer  . Ovarian cancer Mother 60  . Breast cancer Mother        dx in her late 33s-50s  . Thyroid cancer Mother    . Ovarian cancer Sister 70  . Cancer Brother        abdominal tumor  . Ovarian cancer Maternal Aunt    . Breast cancer Maternal Aunt    . Breast cancer Maternal Uncle    . Breast cancer Paternal Aunt    . Ovarian cancer Sister 68  . Ovarian cancer Sister 23  . Cancer Brother        abdominal tumor  . Breast cancer Paternal Aunt    . Breast cancer Paternal Aunt    . Breast cancer Paternal Aunt    . Breast cancer Cousin    . Breast cancer Cousin        SOCIAL HISTORY: Social History         Social History  . Marital status: Married      Spouse name: N/A  . Number of children: 2  . Years of education: N/A        Occupational History  . 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 Smoking status: Former Smoker      Packs/day: 1.00      Years: 30.00      Types:  Cigarettes      Quit date: 06/24/2009  . Smokeless tobacco: Never Used  . Alcohol use No  . Drug use: No  . Sexual activity: Not on file        Other Topics Concern  . Not on file       Social History Narrative    Lives with husband in a one story home.  Has 2 children.      Not currently working.  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    Trying to get disability.      Education: 10th grade.          Allergies  Allergen Reactions  . Sulfa Antibiotics Hives and Itching            Current Outpatient Prescriptions  Medication Sig Dispense Refill  . albuterol (VENTOLIN HFA) 108 (90 Base) MCG/ACT inhaler Inhale 2 puffs into the lungs every 4 (four) hours as needed for wheezing or shortness of breath. 1 Inhaler 3  . amphetamine-dextroamphetamine (ADDERALL) 20 MG tablet Take 1 tablet (20 mg total) by mouth daily. 30 tablet 0  . aspirin 81 MG tablet Take 81 mg by mouth daily.      . cholecalciferol (VITAMIN D) 1000 UNITS tablet Take 7,000 Units by mouth at bedtime.       . clonazePAM (KLONOPIN) 2 MG tablet Take 1 tablet (2 mg total) by mouth 3 (three) times daily as needed for anxiety. 90 tablet 0  . furosemide (LASIX) 20 MG tablet Take 1-2 tablets (20-40 mg total) by mouth daily as needed for fluid. 30 tablet 3  . gabapentin (NEURONTIN) 600 MG tablet Take 1/2 to 1 tablet 3 to 4 x day for neuritis pains 120 tablet 0  . MAGNESIUM PO Take 450 mg by mouth daily.        No current facility-administered medications for this visit.       ROS:      Cardiac: No recent episodes of chest pain/pressure, no shortness  of breath at rest.  No shortness of breath with exertion.  Denies history of atrial fibrillation or irregular heartbeat    Musculoskeletal:  [X]  Arthritis, [X]  Low back pain,  [X]  Joint pain      Physical Examination   Vitals:   05/23/16 0941  BP: 115/71  Pulse: 82  Resp: 16  Temp: 98.5 F (36.9 C)  TempSrc: Oral  SpO2: 94%  Weight: 199 lb (90.3  kg)  Height: 5\' 4"  (1.626 m)     General:  Alert and oriented, no acute distress HEENT: Normal Neurologic: Upper and lower extremity motor 5/5 and symmetric   DATA:  Patient had bilateral ABIs performed Which were 1.03 on the right 0.92 on the left toe pressure greater than 100 bilaterally. She also had a venous duplex exam which showed mild right common femoral reflux and some reflux in the left greater saphenous vein and left common femoral vein. However saphenous vein diameter was less than 3 mm.    ASSESSMENT:  Right posterior and lateral thigh pain history sounds more like neurologic symptoms rather than a vascular etiology. She does have some reticular type varicosities in the right posterior lateral thigh. These currently are not inflamed and I doubt contributed much to her current problem. She does have palpable pulses in her feet bilaterally and has essentially normal ABIs.     PLAN:   right leg pain doubt vascular etiology. Discussed with the patient continuing to follow up with her previous back surgeon. She will follow-up with me on an as-needed basis.     Ruta Hinds, MD [facility] [place] Office: [phone] Pager: [phone]

## 2016-05-27 IMAGING — CT CT CERVICAL SPINE W/O CM
2 of 4 series · 4 of 14 positions shown, 5 images · non-contrast
Comparison: 04/28/2015

CLINICAL DATA: Cervical radiculopathy.  Instability assessment.

EXAM:
CT CERVICAL SPINE WITHOUT CONTRAST
TECHNIQUE: Multidetector CT imaging of the cervical spine was performed without
intravenous contrast. Multiplanar CT image reconstructions were also
generated. Flexion and extension scanning of the cervical spine was
performed.

[Series 3: cspine soft · axial · 0.36mm/px · z∈[-351,-297]mm · 2 of 82 slices shown, 3 images (1 of 2)]
[im 28/82  soft-tissue]
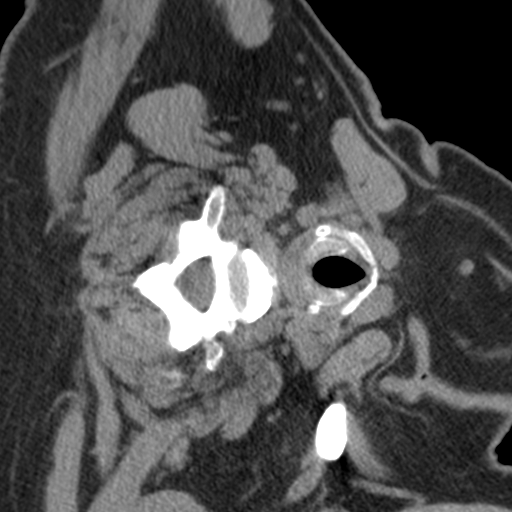
[im 28/82  bone]
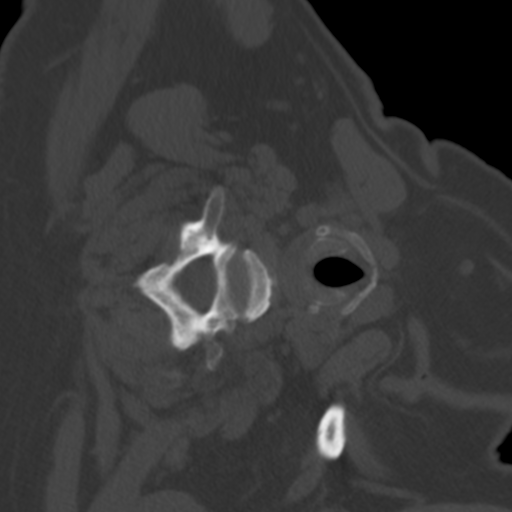
[im 55/82  bone]
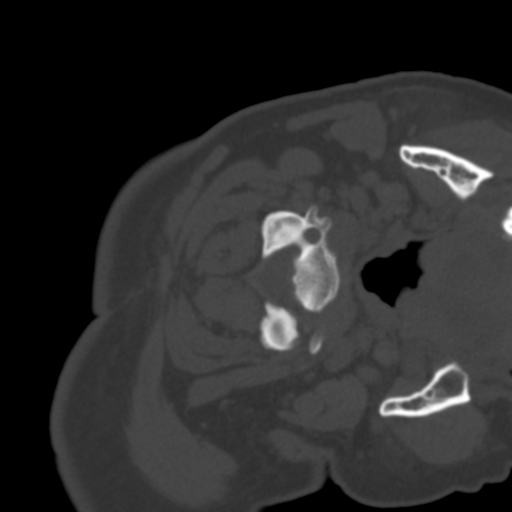

[Series 5: cspine soft · axial · 0.36mm/px · z∈[-351,-297]mm · 2 of 81 slices shown (2 of 2)]
[im 27/81  soft-tissue]
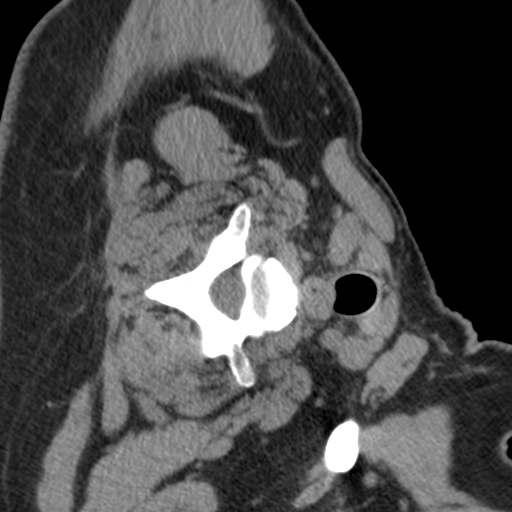
[im 54/81  soft-tissue]
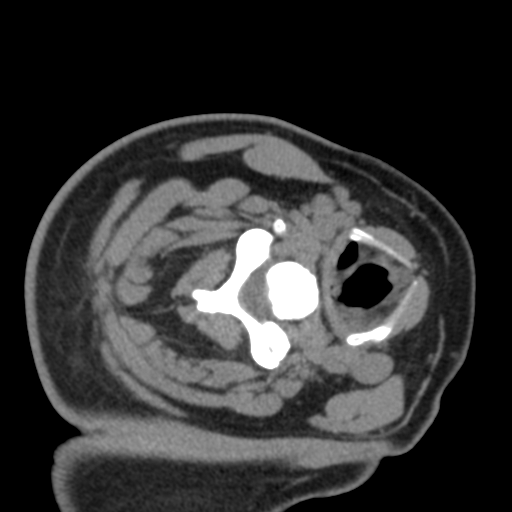

[4 of 14 positions shown; findings below may reference images not displayed]

FINDINGS: Anterior plate and screw fixator noted at C5-6 with interbody spacer
and solid interbody fusion at C5-6. There is no abnormal motion in
the cervical spine between the flexion and extension images.

Mild sclerosis in the upper odontoid, likely incidental or due to a
small bone island.

No significant malalignment. Additional findings at individual
levels are as follows:

C2-3:  Unremarkable.

C3-4:  No impingement.  Minimal uncinate spurring.

C4-5:  No impingement.  Minimal uncinate spurring.

C5-6:  No visible impingement.  This level is fused.

C6-7:  No impingement.  Mild anterior interbody spurring.

C7-T1: No impingement identified. Right laminectomy and partial
facetectomy.

T1-2:  No impingement identified.
IMPRESSION: 1. Solid fusion at C5-6. No impingement is identified in the
cervical spine.
2. Right laminectomy and partial facetectomy at C7-T1.
3. Flexion extension CT imaging of the cervical spine does not
demonstrate abnormal motion today. Conventional radiographic
assessment of flexion and extension may provide similar information
with a lower patient radiation dose.

## 2016-06-30 ENCOUNTER — Observation Stay (HOSPITAL_COMMUNITY)
Admission: EM | Admit: 2016-06-30 | Discharge: 2016-07-01 | Disposition: A | Discharge status: Home / Self Care | Payer: 59 | Attending: Internal Medicine | Admitting: Internal Medicine

## 2016-06-30 ENCOUNTER — Emergency Department (HOSPITAL_COMMUNITY): Payer: 59

## 2016-06-30 ENCOUNTER — Encounter (HOSPITAL_COMMUNITY): Payer: Self-pay | Admitting: Emergency Medicine

## 2016-06-30 DIAGNOSIS — R0600 Dyspnea, unspecified: Secondary | ICD-10-CM | POA: Diagnosis not present

## 2016-06-30 DIAGNOSIS — E669 Obesity, unspecified: Secondary | ICD-10-CM | POA: Diagnosis not present

## 2016-06-30 DIAGNOSIS — Z79899 Other long term (current) drug therapy: Secondary | ICD-10-CM | POA: Diagnosis not present

## 2016-06-30 DIAGNOSIS — M797 Fibromyalgia: Secondary | ICD-10-CM | POA: Insufficient documentation

## 2016-06-30 DIAGNOSIS — Z7982 Long term (current) use of aspirin: Secondary | ICD-10-CM | POA: Insufficient documentation

## 2016-06-30 DIAGNOSIS — F17210 Nicotine dependence, cigarettes, uncomplicated: Secondary | ICD-10-CM | POA: Diagnosis not present

## 2016-06-30 DIAGNOSIS — R0789 Other chest pain: Principal | ICD-10-CM | POA: Insufficient documentation

## 2016-06-30 DIAGNOSIS — R079 Chest pain, unspecified: Secondary | ICD-10-CM | POA: Diagnosis present

## 2016-06-30 DIAGNOSIS — F419 Anxiety disorder, unspecified: Secondary | ICD-10-CM | POA: Diagnosis not present

## 2016-06-30 DIAGNOSIS — G2581 Restless legs syndrome: Secondary | ICD-10-CM | POA: Insufficient documentation

## 2016-06-30 DIAGNOSIS — F909 Attention-deficit hyperactivity disorder, unspecified type: Secondary | ICD-10-CM | POA: Insufficient documentation

## 2016-06-30 DIAGNOSIS — Z85820 Personal history of malignant melanoma of skin: Secondary | ICD-10-CM | POA: Insufficient documentation

## 2016-06-30 DIAGNOSIS — Z6835 Body mass index (BMI) 35.0-35.9, adult: Secondary | ICD-10-CM | POA: Insufficient documentation

## 2016-06-30 DIAGNOSIS — I10 Essential (primary) hypertension: Secondary | ICD-10-CM | POA: Diagnosis not present

## 2016-06-30 DIAGNOSIS — R091 Pleurisy: Secondary | ICD-10-CM

## 2016-06-30 DIAGNOSIS — E559 Vitamin D deficiency, unspecified: Secondary | ICD-10-CM | POA: Insufficient documentation

## 2016-06-30 DIAGNOSIS — Z8551 Personal history of malignant neoplasm of bladder: Secondary | ICD-10-CM | POA: Diagnosis not present

## 2016-06-30 DIAGNOSIS — Z882 Allergy status to sulfonamides status: Secondary | ICD-10-CM | POA: Insufficient documentation

## 2016-06-30 DIAGNOSIS — Z8249 Family history of ischemic heart disease and other diseases of the circulatory system: Secondary | ICD-10-CM | POA: Diagnosis not present

## 2016-06-30 DIAGNOSIS — J449 Chronic obstructive pulmonary disease, unspecified: Secondary | ICD-10-CM | POA: Insufficient documentation

## 2016-06-30 LAB — I-STAT TROPONIN, ED: Troponin i, poc: 0 ng/mL (ref 0.00–0.08)

## 2016-06-30 LAB — BASIC METABOLIC PANEL
Anion gap: 5 (ref 5–15)
BUN: 21 mg/dL — ABNORMAL HIGH (ref 6–20)
CO2: 28 mmol/L (ref 22–32)
Calcium: 9.5 mg/dL (ref 8.9–10.3)
Chloride: 106 mmol/L (ref 101–111)
Creatinine, Ser: 0.88 mg/dL (ref 0.44–1.00)
GFR calc Af Amer: >60 mL/min (ref >60)
GFR calc non Af Amer: >60 mL/min (ref >60)
Glucose, Bld: 90 mg/dL (ref 65–99)
Potassium: 4.2 mmol/L (ref 3.5–5.1)
Sodium: 139 mmol/L (ref 135–145)

## 2016-06-30 LAB — CBC
HCT: 41.1 % (ref 36.0–46.0)
Hemoglobin: 13.9 g/dL (ref 12.0–15.0)
MCH: 29.5 pg (ref 26.0–34.0)
MCHC: 33.8 g/dL (ref 30.0–36.0)
MCV: 87.3 fL (ref 78.0–100.0)
Platelets: 288 10*3/uL (ref 150–400)
RBC: 4.71 MIL/uL (ref 3.87–5.11)
RDW: 12.8 % (ref 11.5–15.5)
WBC: 10.3 10*3/uL (ref 4.0–10.5)

## 2016-06-30 LAB — D-DIMER, QUANTITATIVE (NOT AT ARMC): D-Dimer, Quant: <0.27 ug/mL-FEU (ref 0.00–0.50)

## 2016-06-30 MED ORDER — NITROGLYCERIN 0.4 MG SL SUBL
0.4000 mg | SUBLINGUAL_TABLET | SUBLINGUAL | Status: DC | PRN
  Filled 2016-06-30: qty 1

## 2016-06-30 MED ORDER — ASPIRIN 81 MG PO CHEW
324.0000 mg | CHEWABLE_TABLET | Freq: Once | ORAL | Status: AC
  Administered 2016-06-30: 324 mg via ORAL
  Filled 2016-06-30: qty 4

## 2016-06-30 NOTE — ED Triage Notes (Signed)
Patient c/o central chest pain since last night radiating to right arm with SOB. Denies N/V.

## 2016-06-30 NOTE — ED Provider Notes (Signed)
[facility] DEPT Provider Note   CSN: [id number] Arrival date & time: 06/30/16  2137     History   Chief Complaint Chief Complaint  Patient presents with  . Chest Pain    HPI [name] is a 56 y.o. female.  She has been complaining of a heavy, pressure feeling in her midsternal area since last night. This discomfort has been constant. There is radiation to the back, right side of neck, and right arm. There is associated dyspnea and diaphoresis but no nausea. Discomfort is worse with a deep breath. Nothing makes it better. It is not affected by body position or exertion. She has not had similar symptoms before. She rates the discomfort at 8/10. She does have a history of labile hypertension, but is not on any medication. She denies history of diabetes and hyperlipidemia. She smokes about 1 cigarette a day. There is a positive family history of premature coronary artery disease-her father had his first myocardial infarction at age 21.   The history is provided by the patient.  Chest Pain      Past Medical History:  Diagnosis Date  . ADD (attention deficit disorder)   . Anxiety   . Bladder neoplasm   . Cancer [facility]) 2015   Bladder cancer  . COPD (chronic obstructive pulmonary disease) ([place])   . Depression   . Fibroids   . Fibromyalgia   . H/O bronchitis    treated within the last week w steroids, antibx tessalon pearls.   . H/O sebaceous cyst 07/18/14   left breast   . Hemorrhoid   . History of melanoma excision    leg  . Hyperlipidemia   . Hypertension    normal now, only took medications for 1 month- thinks it was from pain.  Marland Kitchen Hypothyroidism   . Iron deficiency anemia   . PONV (postoperative nausea and vomiting)    used a scop patch last surg-did well  . Prediabetes   . Restless leg syndrome   . Shortness of breath dyspnea    with exertion  . Shoulder impingement    right  . SUI (stress urinary incontinence, female)   . Wears glasses     Patient  Active Problem List   Diagnosis Date Noted  . Back pain 11/24/2015  . Radicular pain 08/02/2015  . Peripheral sensory neuropathy ([place]) 05/10/2015  . Obesity 07/05/2014  . Vitamin D deficiency 07/05/2014  . Thoracic aorta atherosclerosis ([place]) 07/05/2014  . Genetic testing 03/04/2014  . Cancer ([place])   . Fracture of acetabulum ([place]) 12/11/2013  . Hypothyroidism   . Restless leg syndrome   . Fibromyalgia   . Hypertension   . Hyperlipidemia   . COPD (chronic obstructive pulmonary disease) ([place])   . GERD (gastroesophageal reflux disease)   . Prediabetes   . Bipolar 2 disorder, major depressive episode [facility])     Past Surgical History:  Procedure Laterality Date  . ABDOMINAL HYSTERECTOMY    . ANKLE FUSION Right 2011  . ANTERIOR CERVICAL DECOMP/DISCECTOMY FUSION N/A 09/01/2013   Procedure: Anterior cervical decompression fusion, cervical 5-6 with instrumentation and allograft;  Surgeon: Sinclair Ship, MD;  Location: [place];  Service: Orthopedics;  Laterality: N/A;  Anterior cervical decompression fusion, cervical 5-6 with instrumentation and allograft  . ANTERIOR CERVICAL DECOMP/DISCECTOMY FUSION N/A 08/02/2015   Procedure: ANTERIOR CERVICAL DECOMPRESSION FUSION CERVICAL 6 - THORACIC 1 WITH INSTRUMENTATION AND ALLOGRAFT;  Surgeon: Phylliss Bob, MD;  Location: [place];  Service: Orthopedics;  Laterality: N/A;  ANTERIOR CERVICAL DECOMPRESSION FUSION, CERVICAL 6-7,CERVICAL 7-THORACIC 7 WITH INSTRUMENTATION AND ALLOGRAFT  . CATARACT EXTRACTION W/ INTRAOCULAR LENS IMPLANT Bilateral   . CLOSED REDUCTION AND PINNING LEFT FIFTH  PROXIMAL PHALANGEAL FX  10-09-2005  . CYSTOSCOPY W/ RETROGRADES Left 01/11/2014   Procedure: CYSTOSCOPY WITH RETROGRADE PYELOGRAM;  Surgeon: Festus Aloe, MD;  Location: [facility];  Service: Urology;  Laterality: Left;  . CYSTOSCOPY WITH STENT PLACEMENT Left 01/11/2014   Procedure: CYSTOSCOPY WITH STENT PLACEMENT;  Surgeon: Festus Aloe, MD;   Location[facility];  Service: Urology;  Laterality: Left;  . [place] ENDOMETRIAL ABLATION  07-16-2006  . KNEE ARTHROSCOPY WITH MEDIAL MENISECTOMY Right 05/22/2012   Procedure: KNEE ARTHROSCOPY WITH MEDIAL MENISECTOMY and Medial [place] excision;  Surgeon: Alta Corning, MD;  Location: [place];  Service: Orthopedics;  Laterality: Right;  . LAPAROSCOPIC ASSISTED VAGINAL HYSTERECTOMY N/A 08/29/2014   Procedure: LAPAROSCOPIC ASSISTED VAGINAL HYSTERECTOMY;  Surgeon: Molli Posey, MD;  Location: [place];  Service: Gynecology;  Laterality: N/A;  . LAPAROSCOPIC CHOLECYSTECTOMY  2005  . LAPAROTOMY N/A 08/29/2014   Procedure: EXPLORATORY LAPAROTOMY WITH LIGATION OF BLEEDER;  Surgeon: Molli Posey, MD;  Location: [facility];  Service: Gynecology;  Laterality: N/A;  . POSTERIOR CERVICAL FUSION/FORAMINOTOMY N/A 01/25/2015   Procedure: POSTERIOR CERVICAL DECOMPRESSSION CERVICAL 7-THORACIC 1;  Surgeon: Phylliss Bob, MD;  Location: [place];  Service: Orthopedics;  Laterality: N/A;  Posterior cervical decompression, cervical 5-6, cervical 6-7, cervical 7-thoracic 1   . PUBOVAGINAL SLING N/A 08/29/2014   Procedure: Okey Dupre SINGLE INCISION SLING;  Surgeon: Molli Posey, MD;  Location: [facility];  Service: Gynecology;  Laterality: N/A;  . SALPINGOOPHORECTOMY Bilateral 08/29/2014   Procedure: SALPINGO OOPHORECTOMY;  Surgeon: Molli Posey, MD;  Location[facility];  Service: Gynecology;  Laterality: Bilateral;  . SHOULDER ARTHROSCOPY W/ SUBACROMIAL DECOMPRESSION AND DISTAL CLAVICLE EXCISION Left 11-27-2011   W/  DEBRIDEMENT  . SHOULDER ARTHROSCOPY WITH BICEPSTENOTOMY Right 12/21/2014   Procedure: SHOULDER ARTHROSCOPY WITH BICEPSTENOTOMY;  Surgeon: Dorna Leitz, MD;  Location: [place];  Service: Orthopedics;  Laterality: Right;  . SHOULDER ARTHROSCOPY WITH DISTAL CLAVICLE RESECTION Right  12/21/2014   Procedure: SHOULDER ARTHROSCOPY WITH DISTAL CLAVICLE RESECTION;  Surgeon: Dorna Leitz, MD;  Location: [place];  Service: Orthopedics;  Laterality: Right;  . SHOULDER ARTHROSCOPY WITH OPEN ROTATOR CUFF REPAIR Right 07/12/2015   Procedure: SHOULDER ARTHROSCOPY WITH ACROMIOPLASTY AND OPEN ROTATOR CUFF REPAIR;  Surgeon: Dorna Leitz, MD;  Location: [place];  Service: Orthopedics;  Laterality: Right;  . SHOULDER ARTHROSCOPY WITH SUBACROMIAL DECOMPRESSION Left 11/10/2013   Procedure: SHOULDER ARTHROSCOPY WITH SUBACROMIAL DECOMPRESSION WITH ROTATOR CUFF REPAIR AND BICEPS TENODESIS;  Surgeon: Alta Corning, MD;  Location: [place];  Service: Orthopedics;  Laterality: Left;  . SHOULDER ARTHROSCOPY WITH SUBACROMIAL DECOMPRESSION Right 12/21/2014   Procedure: SHOULDER ARTHROSCOPY WITH SUBACROMIAL DECOMPRESSION;  Surgeon: Dorna Leitz, MD;  Location: [place];  Service: Orthopedics;  Laterality: Right;  . TONSILLECTOMY  age 69  . TRANSURETHRAL RESECTION OF BLADDER TUMOR N/A 01/11/2014   Procedure: TRANSURETHRAL RESECTION OF BLADDER TUMOR (TURBT);  Surgeon: Festus Aloe, MD;  Location: [facility];  Service: Urology;  Laterality: N/A;  . TUBAL LIGATION  1980    OB History    No data available       Home Medications    Prior to Admission medications   Medication Sig Start Date  End Date Taking? Authorizing Provider  albuterol (VENTOLIN HFA) 108 (90 Base) MCG/ACT inhaler Inhale 2 puffs into the lungs every 4 (four) hours as needed for wheezing or shortness of breath. 09/08/15   Vicie Mutters, PA-C  amphetamine-dextroamphetamine (ADDERALL) 20 MG tablet Take 1 tablet (20 mg total) by mouth daily. 05/09/16   Vicie Mutters, PA-C  aspirin 81 MG tablet Take 81 mg by mouth daily.    Historical Provider, MD  cholecalciferol (VITAMIN D) 1000 UNITS tablet Take 7,000 Units by mouth at bedtime.     Historical Provider, MD    clonazePAM (KLONOPIN) 2 MG tablet Take 1 tablet (2 mg total) by mouth 3 (three) times daily as needed for anxiety. 05/09/16   Vicie Mutters, PA-C  furosemide (LASIX) 20 MG tablet Take 1-2 tablets (20-40 mg total) by mouth daily as needed for fluid. 03/12/16   Vicie Mutters, PA-C  gabapentin (NEURONTIN) 600 MG tablet Take 1/2 to 1 tablet 3 to 4 x day for neuritis pains 11/24/15   Unk Pinto, MD  MAGNESIUM PO Take 450 mg by mouth daily.    Historical Provider, MD    Family History Family History  Problem Relation Age of Onset  . Cancer Father     Lung and stomach   . Heart disease Father   . Hypertension Father   . Stroke Father   . Heart failure Paternal Grandfather   . Cancer Paternal Grandfather     colon cancer  . Ovarian cancer Mother 32  . Breast cancer Mother     dx in her late 72s-50s  . Thyroid cancer Mother   . Ovarian cancer Sister 36  . Cancer Brother     abdominal tumor  . Ovarian cancer Maternal Aunt   . Breast cancer Maternal Aunt   . Breast cancer Maternal Uncle   . Breast cancer Paternal Aunt   . Ovarian cancer Sister 22  . Ovarian cancer Sister 17  . Cancer Brother     abdominal tumor  . Breast cancer Paternal Aunt   . Breast cancer Paternal Aunt   . Breast cancer Paternal Aunt   . Breast cancer Cousin   . Breast cancer Cousin     Social History Social History  Substance Use Topics  . Smoking status: Former Smoker    Packs/day: 1.00    Years: 30.00    Types: Cigarettes    Quit date: 2015  . Smokeless tobacco: Never Used  . Alcohol use No     Allergies   Sulfa antibiotics   Review of Systems Review of Systems  Cardiovascular: Positive for chest pain.  All other systems reviewed and are negative.    Physical Exam Updated Vital Signs BP (!) 153/71 (BP Location: Left Arm)   Pulse 82   Temp 97.7 F (36.5 C) (Oral)   Resp (!) 22   SpO2 99%   Physical Exam  Nursing note and vitals reviewed.  56 year old female, resting  comfortably and in no acute distress. Vital signs are significant for hypertension and tachypnea. Oxygen saturation is 99%, which is normal. Head is normocephalic and atraumatic. PERRLA, EOMI. Oropharynx is clear. Neck is nontender and supple without adenopathy or JVD. Back is nontender and there is no CVA tenderness. Lungs are clear without rales, wheezes, or rhonchi. Chest is nontender. Heart has regular rate and rhythm without murmur. Abdomen is soft, flat, nontender without masses or hepatosplenomegaly and peristalsis is normoactive. Extremities have no cyanosis or edema, full range of motion  is present. Skin is warm and dry without rash. Neurologic: Mental status is normal, cranial nerves are intact, there are no motor or sensory deficits.  ED Treatments / Results  Labs (all labs ordered are listed, but only abnormal results are displayed) Labs Reviewed  BASIC METABOLIC PANEL - Abnormal; Notable for the following:       Result Value   BUN 21 (*)    All other components within normal limits  CBC  I-STAT TROPOININ, ED    EKG  EKG Interpretation  Date/Time:  Sunday June 30 2016 21:49:55 EDT Ventricular Rate:  73 PR Interval:    QRS Duration: 89 QT Interval:  408 QTC Calculation: 450 R Axis:   94 Text Interpretation:  Sinus rhythm Borderline right axis deviation Low voltage, precordial leads Borderline repolarization abnormality Baseline wander in lead(s) I II aVR V2 When compared with ECG of 08/03/2015, No significant change was found Confirmed by [facility]  MD, Aleina Burgio ([id number]) on 06/30/2016 10:05:30 PM       Radiology Dg Chest 2 View  Result Date: 06/30/2016 CLINICAL DATA:  Central chest pain, onset last night. Shortness of breath. EXAM: CHEST  2 VIEW COMPARISON:  Radiograph 10/07/2015 FINDINGS: The cardiomediastinal contours are normal. An azygos fissure is noted. Pulmonary vasculature is normal. No consolidation, pleural effusion, or pneumothorax. No acute osseous  abnormalities are seen. Surgical hardware in the lower cervical spine is partially included. Widening of the acromioclavicular joints is chronic and unchanged. IMPRESSION: No acute abnormality. Electronically Signed   By: Jeb Levering M.D.   On: 06/30/2016 22:25    Procedures Procedures (including critical care time)  Medications Ordered in ED Medications  nitroGLYCERIN (NITROSTAT) SL tablet 0.4 mg (not administered)  morphine 4 MG/ML injection 4 mg (not administered)  aspirin chewable tablet 324 mg (324 mg Oral Given 06/30/16 2255)     Initial Impression / Assessment and Plan / ED Course  I have reviewed the triage vital signs and the nursing notes.  Pertinent labs & imaging results that were available during my care of the patient were reviewed by me and considered in my medical decision making (see chart for details).  Chest discomfort of uncertain cause. Her description is somewhat worrisome, but the fact that it is not exertional is somewhat reassuring. Lack of ECG changes and troponin changes with symptoms being present about 24 hours is reassuring. Review of old records shows that she has been evaluated for varicose veins, which would put her at increased risk for DVT and pulmonary embolism. This will be screened for with d-dimer. In the meantime, she is given aspirin and nitroglycerin. Heart Score = 4, so she will need to be observed overnight regardless of results of above noted testing.  She had good relief of discomfort with NTG, but still has painwith deep breathing. D-dimer is negative. Case is discussed with Dr. Ara Kussmaul of [facility], who agrees to admit the patient.  Final Clinical Impressions(s) / ED Diagnoses   Final diagnoses:  Chest pain with moderate risk for cardiac etiology    New Prescriptions New Prescriptions   No medications on file     Delora Fuel, MD 29/52/84 1324

## 2016-06-30 NOTE — ED Notes (Signed)
Pt declined NTG at this time due to her fear of developing a headache

## 2016-06-30 NOTE — ED Notes (Signed)
Patient transported to X-ray 

## 2016-07-01 ENCOUNTER — Encounter (HOSPITAL_COMMUNITY): Payer: Self-pay | Admitting: Oncology

## 2016-07-01 ENCOUNTER — Observation Stay (HOSPITAL_COMMUNITY): Payer: 59

## 2016-07-01 ENCOUNTER — Other Ambulatory Visit (HOSPITAL_COMMUNITY): Payer: Managed Care, Other (non HMO)

## 2016-07-01 DIAGNOSIS — R079 Chest pain, unspecified: Secondary | ICD-10-CM

## 2016-07-01 DIAGNOSIS — G629 Polyneuropathy, unspecified: Secondary | ICD-10-CM

## 2016-07-01 DIAGNOSIS — E669 Obesity, unspecified: Secondary | ICD-10-CM | POA: Diagnosis not present

## 2016-07-01 DIAGNOSIS — I10 Essential (primary) hypertension: Secondary | ICD-10-CM | POA: Diagnosis not present

## 2016-07-01 DIAGNOSIS — F418 Other specified anxiety disorders: Secondary | ICD-10-CM | POA: Diagnosis not present

## 2016-07-01 DIAGNOSIS — R072 Precordial pain: Secondary | ICD-10-CM | POA: Diagnosis not present

## 2016-07-01 LAB — TROPONIN I
Troponin I: <0.03 ng/mL (ref <0.03)
Troponin I: <0.03 ng/mL (ref <0.03)
Troponin I: <0.03 ng/mL (ref <0.03)

## 2016-07-01 LAB — BLOOD GAS, ARTERIAL
Acid-Base Excess: 2.5 mmol/L — ABNORMAL HIGH (ref 0.0–2.0)
Bicarbonate: 27.7 mmol/L (ref 20.0–28.0)
Drawn by: 103701
FIO2: 21
O2 Saturation: 94.2 %
Patient temperature: 98.6
pCO2 arterial: 47.8 mmHg (ref 32.0–48.0)
pH, Arterial: 7.381 (ref 7.350–7.450)
pO2, Arterial: 73.7 mmHg — ABNORMAL LOW (ref 83.0–108.0)

## 2016-07-01 MED ORDER — METOPROLOL TARTRATE 25 MG PO TABS
12.5000 mg | ORAL_TABLET | Freq: Two times a day (BID) | ORAL | Status: DC
Start: 2016-07-01 — End: 2016-07-01
  Administered 2016-07-01: 12.5 mg via ORAL

## 2016-07-01 MED ORDER — GI COCKTAIL ~~LOC~~: 30.0000 mL | Freq: Four times a day (QID) | ORAL | Status: DC | PRN

## 2016-07-01 MED ORDER — CYCLOBENZAPRINE HCL 5 MG PO TABS: 5.0000 mg | ORAL_TABLET | Freq: Three times a day (TID) | ORAL | Status: DC | PRN

## 2016-07-01 MED ORDER — KETOROLAC TROMETHAMINE 10 MG PO TABS: 10.0000 mg | ORAL_TABLET | Freq: Three times a day (TID) | ORAL | 0 refills | Status: DC

## 2016-07-01 MED ORDER — MORPHINE SULFATE (PF) 4 MG/ML IV SOLN
2.0000 mg | INTRAVENOUS | Status: DC | PRN
  Administered 2016-07-01: 2 mg via INTRAVENOUS
  Filled 2016-07-01: qty 1

## 2016-07-01 MED ORDER — METOPROLOL TARTRATE 25 MG PO TABS
25.0000 mg | ORAL_TABLET | Freq: Two times a day (BID) | ORAL | Status: DC
  Administered 2016-07-01: 25 mg via ORAL
  Filled 2016-07-01 (×2): qty 1

## 2016-07-01 MED ORDER — VITAMIN D3 25 MCG (1000 UNIT) PO TABS
7000.0000 [IU] | ORAL_TABLET | Freq: Every day | ORAL | Status: DC
  Administered 2016-07-01: 7000 [IU] via ORAL
  Filled 2016-07-01: qty 7

## 2016-07-01 MED ORDER — ONDANSETRON HCL 4 MG/2ML IJ SOLN: 4.0000 mg | Freq: Four times a day (QID) | INTRAMUSCULAR | Status: DC | PRN

## 2016-07-01 MED ORDER — CLONAZEPAM 1 MG PO TABS: 2.0000 mg | ORAL_TABLET | Freq: Three times a day (TID) | ORAL | Status: DC | PRN

## 2016-07-01 MED ORDER — ALPRAZOLAM 0.25 MG PO TABS: 0.2500 mg | ORAL_TABLET | Freq: Two times a day (BID) | ORAL | Status: DC | PRN

## 2016-07-01 MED ORDER — LORAZEPAM 2 MG/ML IJ SOLN
0.5000 mg | Freq: Once | INTRAMUSCULAR | Status: AC
  Administered 2016-07-01: 0.5 mg via INTRAVENOUS
  Filled 2016-07-01: qty 1

## 2016-07-01 MED ORDER — ALBUTEROL SULFATE (2.5 MG/3ML) 0.083% IN NEBU: 3.0000 mL | INHALATION_SOLUTION | RESPIRATORY_TRACT | Status: DC | PRN

## 2016-07-01 MED ORDER — HEPARIN SODIUM (PORCINE) 5000 UNIT/ML IJ SOLN
5000.0000 [IU] | Freq: Three times a day (TID) | INTRAMUSCULAR | Status: DC
  Administered 2016-07-01 (×3): 5000 [IU] via SUBCUTANEOUS
  Filled 2016-07-01 (×3): qty 1

## 2016-07-01 MED ORDER — ACETAMINOPHEN 325 MG PO TABS: 650.0000 mg | ORAL_TABLET | ORAL | Status: DC | PRN

## 2016-07-01 MED ORDER — AMPHETAMINE-DEXTROAMPHETAMINE 20 MG PO TABS: 20.0000 mg | ORAL_TABLET | Freq: Every day | ORAL | Status: DC

## 2016-07-01 MED ORDER — MORPHINE SULFATE (PF) 4 MG/ML IV SOLN
4.0000 mg | Freq: Once | INTRAVENOUS | Status: AC
  Administered 2016-07-01: 4 mg via INTRAVENOUS
  Filled 2016-07-01: qty 1

## 2016-07-01 MED ORDER — KETOROLAC TROMETHAMINE 30 MG/ML IJ SOLN
30.0000 mg | Freq: Three times a day (TID) | INTRAMUSCULAR | Status: DC | PRN
  Administered 2016-07-01: 30 mg via INTRAVENOUS
  Filled 2016-07-01: qty 1

## 2016-07-01 MED ORDER — NITROGLYCERIN 0.4 MG SL SUBL: 0.4000 mg | SUBLINGUAL_TABLET | SUBLINGUAL | Status: DC | PRN

## 2016-07-01 MED ORDER — KETOROLAC TROMETHAMINE 30 MG/ML IJ SOLN
30.0000 mg | Freq: Four times a day (QID) | INTRAMUSCULAR | Status: DC
  Administered 2016-07-01 (×2): 30 mg via INTRAVENOUS
  Filled 2016-07-01 (×2): qty 1

## 2016-07-01 MED ORDER — IOPAMIDOL (ISOVUE-300) INJECTION 61%
INTRAVENOUS | Status: AC
  Administered 2016-07-01: 75 mL
  Filled 2016-07-01: qty 75

## 2016-07-01 MED ORDER — ASPIRIN 81 MG PO CHEW
81.0000 mg | CHEWABLE_TABLET | Freq: Every day | ORAL | Status: DC
  Administered 2016-07-01: 81 mg via ORAL
  Filled 2016-07-01: qty 1

## 2016-07-01 MED ORDER — FUROSEMIDE 20 MG PO TABS: 20.0000 mg | ORAL_TABLET | Freq: Every day | ORAL | Status: DC | PRN

## 2016-07-01 MED ORDER — ZOLPIDEM TARTRATE 5 MG PO TABS: 5.0000 mg | ORAL_TABLET | Freq: Every evening | ORAL | Status: DC | PRN

## 2016-07-01 MED ORDER — METOPROLOL TARTRATE 25 MG PO TABS: 12.5000 mg | ORAL_TABLET | Freq: Two times a day (BID) | ORAL | 0 refills | Status: DC

## 2016-07-01 MED ORDER — SODIUM CHLORIDE 0.9 % IV SOLN
INTRAVENOUS | Status: DC
  Administered 2016-07-01 (×2): via INTRAVENOUS

## 2016-07-01 NOTE — Consult Note (Signed)
CARDIOLOGY CONSULT NOTE   Patient ID: [name] MRN: [id number] DOB/AGE: March 04, 1961 56 y.o.  Admit date: 06/30/2016  Primary Physician   Lori Richards, MD Primary Cardiologist   New Reason for Consultation   Chest pain Requesting MD: Dr [name]  TDV:VOHYWV [name] is a 56 y.o. year old female with a history of COPD, HTN, HLD, fibromyalgia, bladder CA, ADD, pre-DM. Seen by Dr [name] 04/2016 for LE pain, ABIs and Dopplers ordered, they were ok.  Pt admitted 03/25 with chest pain, cards asked to see.   Pt CP is central chest heaviness, worse with deep inspiration. Started 03/24 with tightness, later she felt palpitations. Then she felt a jolt or jab in her chest. She was able to sleep. She was having sx all day yesterday. Last pm, sx became worse and she was having trouble breathing. That is why she came in. It was a 10/10 without trying to breathe. Still worse with deep inspiration.   She got morphine last pm, it relaxed her and she felt calmer. She got Toradol later, that is what made the heaviness go away. However, it is still hard for her to take a deep breath. She feels the heaviness coming back a little. She still does not feel able to take a deep breath.  She has a funny feeling in her R arm and the R side of her head was "not right". She noticed her R eye was red this am. She is having headaches over her R eye and on the R side of her head. She has been very thirsty.    She has back problems and bursitis in her R hip, was started on Mobic but had only taken it 2 days. She has not taken it in over 24 hours. She was doing 3 miles on the treadmill regularly, none in 3 weeks due to hip pain. No hx exertional chest pain. Exertional SOB at baseline. Smokes occasionally.    Past Medical History:  Diagnosis Date  . ADD (attention deficit disorder)   . Anxiety   . Bladder neoplasm   . Cancer [facility]) 2015   Bladder cancer  . COPD (chronic obstructive pulmonary  disease) ([place])   . Depression   . Fibroids   . Fibromyalgia   . H/O bronchitis    treated within the last week w steroids, antibx tessalon pearls.   . H/O sebaceous cyst 07/18/14   left breast   . Hemorrhoid   . History of melanoma excision    leg  . Hyperlipidemia   . Hypertension    normal now, only took medications for 1 month- thinks it was from pain.  Marland Kitchen Hypothyroidism   . Iron deficiency anemia   . PONV (postoperative nausea and vomiting)    used a scop patch last surg-did well  . Prediabetes   . Restless leg syndrome   . Shortness of breath dyspnea    with exertion  . Shoulder impingement    right  . SUI (stress urinary incontinence, female)   . Wears glasses      Past Surgical History:  Procedure Laterality Date  . ABDOMINAL HYSTERECTOMY    . ANKLE FUSION Right 2011  . ANTERIOR CERVICAL DECOMP/DISCECTOMY FUSION N/A 09/01/2013   Procedure: Anterior cervical decompression fusion, cervical 5-6 with instrumentation and allograft;  Surgeon: Lori Ship, MD;  Location: [place];  Service: Orthopedics;  Laterality: N/A;  Anterior cervical decompression fusion, cervical 5-6 with instrumentation and allograft  . ANTERIOR  CERVICAL DECOMP/DISCECTOMY FUSION N/A 08/02/2015   Procedure: ANTERIOR CERVICAL DECOMPRESSION FUSION CERVICAL 6 - THORACIC 1 WITH INSTRUMENTATION AND ALLOGRAFT;  Surgeon: Lori Bob, MD;  Location: [place];  Service: Orthopedics;  Laterality: N/A;  ANTERIOR CERVICAL DECOMPRESSION FUSION, CERVICAL 6-7,CERVICAL 7-THORACIC 7 WITH INSTRUMENTATION AND ALLOGRAFT  . CATARACT EXTRACTION W/ INTRAOCULAR LENS IMPLANT Bilateral   . CLOSED REDUCTION AND PINNING LEFT FIFTH  PROXIMAL PHALANGEAL FX  10-09-2005  . CYSTOSCOPY W/ RETROGRADES Left 01/11/2014   Procedure: CYSTOSCOPY WITH RETROGRADE PYELOGRAM;  Surgeon: Lori Aloe, MD;  Location: [facility];  Service: Urology;  Laterality: Left;  . CYSTOSCOPY WITH STENT PLACEMENT Left 01/11/2014   Procedure:  CYSTOSCOPY WITH STENT PLACEMENT;  Surgeon: Lori Aloe, MD;  Location: [facility];  Service: Urology;  Laterality: Left;  . [place] ENDOMETRIAL ABLATION  07-16-2006  . KNEE ARTHROSCOPY WITH MEDIAL MENISECTOMY Right 05/22/2012   Procedure: KNEE ARTHROSCOPY WITH MEDIAL MENISECTOMY and Medial [place] excision;  Surgeon: Lori Corning, MD;  Location: [place];  Service: Orthopedics;  Laterality: Right;  . LAPAROSCOPIC ASSISTED VAGINAL HYSTERECTOMY N/A 08/29/2014   Procedure: LAPAROSCOPIC ASSISTED VAGINAL HYSTERECTOMY;  Surgeon: Lori Posey, MD;  Location: [place];  Service: Gynecology;  Laterality: N/A;  . LAPAROSCOPIC CHOLECYSTECTOMY  2005  . LAPAROTOMY N/A 08/29/2014   Procedure: EXPLORATORY LAPAROTOMY WITH LIGATION OF BLEEDER;  Surgeon: Lori Posey, MD;  Location: [facility];  Service: Gynecology;  Laterality: N/A;  . POSTERIOR CERVICAL FUSION/FORAMINOTOMY N/A 01/25/2015   Procedure: POSTERIOR CERVICAL DECOMPRESSSION CERVICAL 7-THORACIC 1;  Surgeon: Lori Bob, MD;  Location: [place];  Service: Orthopedics;  Laterality: N/A;  Posterior cervical decompression, cervical 5-6, cervical 6-7, cervical 7-thoracic 1   . PUBOVAGINAL SLING N/A 08/29/2014   Procedure: [name] SINGLE INCISION SLING;  Surgeon: Lori Posey, MD;  Location[facility];  Service: Gynecology;  Laterality: N/A;  . SALPINGOOPHORECTOMY Bilateral 08/29/2014   Procedure: SALPINGO OOPHORECTOMY;  Surgeon: Lori Posey, MD;  Location: [facility];  Service: Gynecology;  Laterality: Bilateral;  . SHOULDER ARTHROSCOPY W/ SUBACROMIAL DECOMPRESSION AND DISTAL CLAVICLE EXCISION Left 11-27-2011   W/  DEBRIDEMENT  . SHOULDER ARTHROSCOPY WITH BICEPSTENOTOMY Right 12/21/2014   Procedure: SHOULDER ARTHROSCOPY WITH BICEPSTENOTOMY;  Surgeon: Lori Leitz, MD;  Location: [place];  Service: Orthopedics;  Laterality: Right;   . SHOULDER ARTHROSCOPY WITH DISTAL CLAVICLE RESECTION Right 12/21/2014   Procedure: SHOULDER ARTHROSCOPY WITH DISTAL CLAVICLE RESECTION;  Surgeon: Lori Leitz, MD;  Location: [place];  Service: Orthopedics;  Laterality: Right;  . SHOULDER ARTHROSCOPY WITH OPEN ROTATOR CUFF REPAIR Right 07/12/2015   Procedure: SHOULDER ARTHROSCOPY WITH ACROMIOPLASTY AND OPEN ROTATOR CUFF REPAIR;  Surgeon: Lori Leitz, MD;  Location: [place];  Service: Orthopedics;  Laterality: Right;  . SHOULDER ARTHROSCOPY WITH SUBACROMIAL DECOMPRESSION Left 11/10/2013   Procedure: SHOULDER ARTHROSCOPY WITH SUBACROMIAL DECOMPRESSION WITH ROTATOR CUFF REPAIR AND BICEPS TENODESIS;  Surgeon: Lori Corning, MD;  Location: [place];  Service: Orthopedics;  Laterality: Left;  . SHOULDER ARTHROSCOPY WITH SUBACROMIAL DECOMPRESSION Right 12/21/2014   Procedure: SHOULDER ARTHROSCOPY WITH SUBACROMIAL DECOMPRESSION;  Surgeon: Lori Leitz, MD;  Location: [place];  Service: Orthopedics;  Laterality: Right;  . TONSILLECTOMY  age 67  . TRANSURETHRAL RESECTION OF BLADDER TUMOR N/A 01/11/2014   Procedure: TRANSURETHRAL RESECTION OF BLADDER TUMOR (TURBT);  Surgeon: Lori Aloe, MD;  Location: [facility];  Service: Urology;  Laterality: N/A;  .  TUBAL LIGATION  1980    Allergies  Allergen Reactions  . Sulfa Antibiotics Hives and Itching    I have reviewed the patient's current medications . amphetamine-dextroamphetamine  20 mg Oral Daily  . aspirin  81 mg Oral Daily  . cholecalciferol  7,000 Units Oral QHS  . heparin  5,000 Units Subcutaneous Q8H  . metoprolol tartrate  25 mg Oral BID   . sodium chloride 75 mL/hr at 07/01/16 0147   acetaminophen, albuterol, ALPRAZolam, clonazePAM, cyclobenzaprine, furosemide, gi cocktail, ketorolac, morphine injection, nitroGLYCERIN, nitroGLYCERIN, ondansetron (ZOFRAN) IV, zolpidem  Medication Sig  albuterol (VENTOLIN  HFA) 108 (90 Base) MCG/ACT inhaler Inhale 2 puffs into the lungs every 4 (four) hours as needed for wheezing or shortness of breath.  amphetamine-dextroamphetamine (ADDERALL) 20 MG tablet Take 1 tablet (20 mg total) by mouth daily. Patient taking differently: Take 10 mg by mouth every morning.   aspirin 81 MG tablet Take 81 mg by mouth daily.  cholecalciferol (VITAMIN D) 1000 UNITS tablet Take 7,000 Units by mouth at bedtime.   clonazePAM (KLONOPIN) 2 MG tablet Take 1 tablet (2 mg total) by mouth 3 (three) times daily as needed for anxiety. Patient taking differently: Take 2 mg by mouth at bedtime.   cyclobenzaprine (FLEXERIL) 5 MG tablet Take 5-10 mg by mouth daily as needed for muscle spasms.   MAGNESIUM PO Take 450 mg by mouth daily.  meloxicam (MOBIC) 15 MG tablet Take 15 mg by mouth daily as needed for pain.   traMADol (ULTRAM) 50 MG tablet Take 50 mg by mouth every 8 (eight) hours as needed for moderate pain or severe pain.   furosemide (LASIX) 20 MG tablet Take 1-2 tablets (20-40 mg total) by mouth daily as needed for fluid. Patient not taking: Reported on 07/01/2016  gabapentin (NEURONTIN) 600 MG tablet Take 1/2 to 1 tablet 3 to 4 x day for neuritis pains Patient not taking: Reported on 07/01/2016     Social History   Social History  . Marital status: Married    Spouse name: N/A  . Number of children: 2  . Years of education: N/A   Occupational History  . logistic specialist [employer]    Social History Main Topics  . Smoking status: Former Smoker    Packs/day: 1.00    Years: 30.00    Types: Cigarettes    Quit date: 2015  . Smokeless tobacco: Never Used  . Alcohol use No  . Drug use: No  . Sexual activity: Not on file   Other Topics Concern  . Not on file   Social History Narrative   Lives with husband in a one story home.  Has 2 children.     Not currently working.  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   Trying to get disability.      Education: 10th grade.    Family Status  Relation Status  . Father Deceased  . Paternal Grandfather Deceased  . Mother Alive  . Sister Alive  . Brother Alive  . Maternal Aunt Deceased  . Maternal Uncle Alive  . Paternal Advertising account executive  . Sister Alive  . Sister Alive  . Brother Alive  . Paternal Aunt Deceased  . Paternal Aunt Deceased  . Paternal Aunt Deceased  . Cousin Deceased  . Cousin Alive   Family History  Problem Relation Age of Onset  . Cancer Father     Lung and stomach   . Heart disease Father   .  Hypertension Father   . Stroke Father   . Heart failure Paternal Grandfather   . Cancer Paternal Grandfather     colon cancer  . Ovarian cancer Mother 29  . Breast cancer Mother     dx in her late 79s-50s  . Thyroid cancer Mother   . Ovarian cancer Sister 101  . Cancer Brother     abdominal tumor  . Ovarian cancer Maternal Aunt   . Breast cancer Maternal Aunt   . Breast cancer Maternal Uncle   . Breast cancer Paternal Aunt   . Ovarian cancer Sister 36  . Ovarian cancer Sister 73  . Cancer Brother     abdominal tumor  . Breast cancer Paternal Aunt   . Breast cancer Paternal Aunt   . Breast cancer Paternal Aunt   . Breast cancer Cousin   . Breast cancer Cousin      ROS:  Full 14 point review of systems complete and found to be negative unless listed above.  Physical Exam: Blood pressure (!) 116/58, pulse 60, temperature 97.8 F (36.6 C), temperature source Oral, resp. rate 20, height 5\' 3"  (1.6 m), weight 197 lb 5 oz (89.5 kg), SpO2 99 %.  General: Well developed, well nourished, female in no acute distress Head: Eyes PERRLA, No xanthomas.   Normocephalic and atraumatic, oropharynx without edema or exudate. Dentition: good Lungs: rales bases, R>L Heart: HRRR S1 S2, no rub/gallop, no murmur. pulses are 2+ all 4 extrem.   Neck: No carotid bruits. No lymphadenopathy.  JVD not elevated Abdomen: Bowel sounds present, abdomen soft and non-tender without masses  or hernias noted. Msk:  No spine or cva tenderness. No weakness, no joint deformities or effusions. Extremities: No clubbing or cyanosis. No edema.  Neuro: Alert and oriented X 3. No focal deficits noted. Psych:  Good affect, responds appropriately Skin: No rashes or lesions noted.  Labs:   Lab Results  Component Value Date   WBC 10.3 06/30/2016   HGB 13.9 06/30/2016   HCT 41.1 06/30/2016   MCV 87.3 06/30/2016   PLT 288 06/30/2016    Recent Labs Lab 06/30/16 2151  NA 139  K 4.2  CL 106  CO2 28  BUN 21*  CREATININE 0.88  CALCIUM 9.5  GLUCOSE 90    Recent Labs  07/01/16 0341 07/01/16 0640  TROPONINI <0.03 <0.03    Recent Labs  06/30/16 2206  TROPIPOC 0.00   Lab Results  Component Value Date   CHOL 193 03/12/2016   HDL 61 03/12/2016   LDLCALC 114 (H) 03/12/2016   TRIG 91 03/12/2016   Lab Results  Component Value Date   DDIMER <0.27 06/30/2016   Echo: ordered  ECG:  03/26 Sinus brady, HR 56, otherwise normal  Cath: n/a  Radiology:  Dg Chest 2 View Result Date: 06/30/2016 CLINICAL DATA:  Central chest pain, onset last night. Shortness of breath. EXAM: CHEST  2 VIEW COMPARISON:  Radiograph 10/07/2015 FINDINGS: The cardiomediastinal contours are normal. An azygos fissure is noted. Pulmonary vasculature is normal. No consolidation, pleural effusion, or pneumothorax. No acute osseous abnormalities are seen. Surgical hardware in the lower cervical spine is partially included. Widening of the acromioclavicular joints is chronic and unchanged. IMPRESSION: No acute abnormality. Electronically Signed   By: Jeb Levering M.D.   On: 06/30/2016 22:25    ASSESSMENT AND PLAN:   The patient was seen today by Dr Ellyn Hack, the patient evaluated and the data reviewed.   Active Problems:   Chest pain,  rule out acute myocardial infarction - low risk for cardiac etiology - no hx exertional sx - sx improved w/ Toradol, will continue this x 24 hours scheduled. - sx are  atypical but she has mult CRFs including HTN, HLD, FH CAD, hx tob use (only occasional now), obesity - cannot do MV today, she had coffee with breakfast. Since still has pain w/ deep inspiration, would have to be Carlton Adam - MD advise on doing MV tomorrow or f/u in office and decide on stress testing then. - otherwise, per IM    Sinus bradycardia - pt HR in the 50s after a dose of metoprolol 25 mg, new for her - will decrease the dose to 12.5 mg bid and see how tolerated.   SignedLenoard Aden 07/01/2016 8:20 AM Murlean Hark [phone]  Co-Sign MD  I have seen, examined and evaluated the patient this PM along with Rosaria Ferries, PA-C .  After reviewing all the available data and chart, we discussed the patients laboratory, study & physical findings as well as symptoms in detail. I agree with her findings, examination as well as impression recommendations as per our discussion.    CP sounds c/w pleuritic pain - no rub on exam & no EKG findings to suggests pericarditis -- TRH evaluating chest with CT. - positional & worse with deep breath.  - She has ruled out for MI. I recently feel that her chest discomfort is not cardiac in nature. She is lying flat and having pain worse with deep inspiration. This is likely either pleuritic pain or muscloskeletal pain from costochondritis -- not uncommon given her history of fibromyalgia. Agree with judicious use of NSAIDs.  At this time, I don't think that she warrants an ischemic evaluation for this chest pain as it is very low likelihood of cardiac etiology.  I discussed recommendations with Dr. [place] Padilla. We will sign off for now.    Glenetta Hew, M.D., M.S. Interventional Cardiologist   Pager # [phone] Phone # [phone] [address] [place], [place] [address]

## 2016-07-01 NOTE — H&P (Addendum)
[place] @ [facility] Admission History and Physical [employer], D.O.  ---------------------------------------------------------------------------------------------------------------------   PATIENT NAME: [name] MR#: [id number] DATE OF BIRTH: February 17, 1961 DATE OF ADMISSION: 06/30/2016 PRIMARY CARE PHYSICIAN: Alesia Richards, MD  REQUESTING/REFERRING PHYSICIAN: ED Dr. Roxanne Mins  CHIEF COMPLAINT: Chief Complaint  Patient presents with  . Chest Pain    HISTORY OF PRESENT ILLNESS: [name] is a 56 y.o. female with a known history of ADD, anxiety/depression, bladder cancer, HLD, HTN, hypothyroidism, iron deficiency anemia presents to the emergency department for evaluation of chest pain.  Patient was in a usual state of health until one day ago when she developed substernal chest pressure radiating to back and right arm associated with shortness of breath, diaphoresis and anxiety.  Pain is worse with inspiration and is getting worse as the day goes on.  States she felt palpitations.   Otherwise there has been no change in status. Patient has been taking medication as prescribed and there has been no recent change in medication or diet.  There has been no recent illness, travel or sick contacts.    Patient denies fevers/chills, weakness, dizziness, shortness of breath, N/V/C/D, abdominal pain, dysuria/frequency, changes in mental status.   EMS/ED COURSE:   Patient received aspirin, morphine, nitro.  PAST MEDICAL HISTORY: Past Medical History:  Diagnosis Date  . ADD (attention deficit disorder)   . Anxiety   . Bladder neoplasm   . Cancer [facility]) 2015   Bladder cancer  . COPD (chronic obstructive pulmonary disease) ([place])   . Depression   . Fibroids   . Fibromyalgia   . H/O bronchitis    treated within the last week w steroids, antibx tessalon pearls.   . H/O sebaceous cyst 07/18/14   left breast   . Hemorrhoid   . History of melanoma excision     leg  . Hyperlipidemia   . Hypertension    normal now, only took medications for 1 month- thinks it was from pain.  Marland Kitchen Hypothyroidism   . Iron deficiency anemia   . PONV (postoperative nausea and vomiting)    used a scop patch last surg-did well  . Prediabetes   . Restless leg syndrome   . Shortness of breath dyspnea    with exertion  . Shoulder impingement    right  . SUI (stress urinary incontinence, female)   . Wears glasses       PAST SURGICAL HISTORY: Past Surgical History:  Procedure Laterality Date  . ABDOMINAL HYSTERECTOMY    . ANKLE FUSION Right 2011  . ANTERIOR CERVICAL DECOMP/DISCECTOMY FUSION N/A 09/01/2013   Procedure: Anterior cervical decompression fusion, cervical 5-6 with instrumentation and allograft;  Surgeon: Sinclair Ship, MD;  Location: [place];  Service: Orthopedics;  Laterality: N/A;  Anterior cervical decompression fusion, cervical 5-6 with instrumentation and allograft  . ANTERIOR CERVICAL DECOMP/DISCECTOMY FUSION N/A 08/02/2015   Procedure: ANTERIOR CERVICAL DECOMPRESSION FUSION CERVICAL 6 - THORACIC 1 WITH INSTRUMENTATION AND ALLOGRAFT;  Surgeon: Phylliss Bob, MD;  Location: [place];  Service: Orthopedics;  Laterality: N/A;  ANTERIOR CERVICAL DECOMPRESSION FUSION, CERVICAL 6-7,CERVICAL 7-THORACIC 7 WITH INSTRUMENTATION AND ALLOGRAFT  . CATARACT EXTRACTION W/ INTRAOCULAR LENS IMPLANT Bilateral   . CLOSED REDUCTION AND PINNING LEFT FIFTH  PROXIMAL PHALANGEAL FX  10-09-2005  . CYSTOSCOPY W/ RETROGRADES Left 01/11/2014   Procedure: CYSTOSCOPY WITH RETROGRADE PYELOGRAM;  Surgeon: Festus Aloe, MD;  Location[facility];  Service: Urology;  Laterality: Left;  . CYSTOSCOPY WITH STENT PLACEMENT Left 01/11/2014  Procedure: CYSTOSCOPY WITH STENT PLACEMENT;  Surgeon: Festus Aloe, MD;  Location: [facility];  Service: Urology;  Laterality: Left;  . [place] ENDOMETRIAL ABLATION  07-16-2006  . KNEE  ARTHROSCOPY WITH MEDIAL MENISECTOMY Right 05/22/2012   Procedure: KNEE ARTHROSCOPY WITH MEDIAL MENISECTOMY and Medial [place] excision;  Surgeon: Alta Corning, MD;  Location: [place];  Service: Orthopedics;  Laterality: Right;  . LAPAROSCOPIC ASSISTED VAGINAL HYSTERECTOMY N/A 08/29/2014   Procedure: LAPAROSCOPIC ASSISTED VAGINAL HYSTERECTOMY;  Surgeon: Molli Posey, MD;  Location: [place];  Service: Gynecology;  Laterality: N/A;  . LAPAROSCOPIC CHOLECYSTECTOMY  2005  . LAPAROTOMY N/A 08/29/2014   Procedure: EXPLORATORY LAPAROTOMY WITH LIGATION OF BLEEDER;  Surgeon: Molli Posey, MD;  Location: [facility];  Service: Gynecology;  Laterality: N/A;  . POSTERIOR CERVICAL FUSION/FORAMINOTOMY N/A 01/25/2015   Procedure: POSTERIOR CERVICAL DECOMPRESSSION CERVICAL 7-THORACIC 1;  Surgeon: Phylliss Bob, MD;  Location: [place];  Service: Orthopedics;  Laterality: N/A;  Posterior cervical decompression, cervical 5-6, cervical 6-7, cervical 7-thoracic 1   . PUBOVAGINAL SLING N/A 08/29/2014   Procedure: Okey Dupre SINGLE INCISION SLING;  Surgeon: Molli Posey, MD;  Location: [facility];  Service: Gynecology;  Laterality: N/A;  . SALPINGOOPHORECTOMY Bilateral 08/29/2014   Procedure: SALPINGO OOPHORECTOMY;  Surgeon: Molli Posey, MD;  Location[facility];  Service: Gynecology;  Laterality: Bilateral;  . SHOULDER ARTHROSCOPY W/ SUBACROMIAL DECOMPRESSION AND DISTAL CLAVICLE EXCISION Left 11-27-2011   W/  DEBRIDEMENT  . SHOULDER ARTHROSCOPY WITH BICEPSTENOTOMY Right 12/21/2014   Procedure: SHOULDER ARTHROSCOPY WITH BICEPSTENOTOMY;  Surgeon: Dorna Leitz, MD;  Location: [place];  Service: Orthopedics;  Laterality: Right;  . SHOULDER ARTHROSCOPY WITH DISTAL CLAVICLE RESECTION Right 12/21/2014   Procedure: SHOULDER ARTHROSCOPY WITH DISTAL CLAVICLE RESECTION;  Surgeon: Dorna Leitz, MD;  Location: [place];  Service:  Orthopedics;  Laterality: Right;  . SHOULDER ARTHROSCOPY WITH OPEN ROTATOR CUFF REPAIR Right 07/12/2015   Procedure: SHOULDER ARTHROSCOPY WITH ACROMIOPLASTY AND OPEN ROTATOR CUFF REPAIR;  Surgeon: Dorna Leitz, MD;  Location: [place];  Service: Orthopedics;  Laterality: Right;  . SHOULDER ARTHROSCOPY WITH SUBACROMIAL DECOMPRESSION Left 11/10/2013   Procedure: SHOULDER ARTHROSCOPY WITH SUBACROMIAL DECOMPRESSION WITH ROTATOR CUFF REPAIR AND BICEPS TENODESIS;  Surgeon: Alta Corning, MD;  Location: [place];  Service: Orthopedics;  Laterality: Left;  . SHOULDER ARTHROSCOPY WITH SUBACROMIAL DECOMPRESSION Right 12/21/2014   Procedure: SHOULDER ARTHROSCOPY WITH SUBACROMIAL DECOMPRESSION;  Surgeon: Dorna Leitz, MD;  Location: [place];  Service: Orthopedics;  Laterality: Right;  . TONSILLECTOMY  age 6  . TRANSURETHRAL RESECTION OF BLADDER TUMOR N/A 01/11/2014   Procedure: TRANSURETHRAL RESECTION OF BLADDER TUMOR (TURBT);  Surgeon: Festus Aloe, MD;  Location: [facility];  Service: Urology;  Laterality: N/A;  . TUBAL LIGATION  1980      SOCIAL HISTORY: Social History  Substance Use Topics  . Smoking status: Former Smoker    Packs/day: 1.00    Years: 30.00    Types: Cigarettes    Quit date: 2015  . Smokeless tobacco: Never Used  . Alcohol use No      FAMILY HISTORY: Family History  Problem Relation Age of Onset  . Cancer Father     Lung and stomach   . Heart disease Father   . Hypertension Father   . Stroke Father   . Heart failure Paternal Grandfather   . Cancer Paternal Grandfather  colon cancer  . Ovarian cancer Mother 72  . Breast cancer Mother     dx in her late 5s-50s  . Thyroid cancer Mother   . Ovarian cancer Sister 69  . Cancer Brother     abdominal tumor  . Ovarian cancer Maternal Aunt   . Breast cancer Maternal Aunt   . Breast cancer Maternal Uncle   . Breast cancer Paternal Aunt   . Ovarian  cancer Sister 81  . Ovarian cancer Sister 59  . Cancer Brother     abdominal tumor  . Breast cancer Paternal Aunt   . Breast cancer Paternal Aunt   . Breast cancer Paternal Aunt   . Breast cancer Cousin   . Breast cancer Cousin    Mother with atrial fibrillation.  MEDICATIONS AT HOME: Prior to Admission medications   Medication Sig Start Date End Date Taking? Authorizing Provider  albuterol (VENTOLIN HFA) 108 (90 Base) MCG/ACT inhaler Inhale 2 puffs into the lungs every 4 (four) hours as needed for wheezing or shortness of breath. 09/08/15   Vicie Mutters, PA-C  amphetamine-dextroamphetamine (ADDERALL) 20 MG tablet Take 1 tablet (20 mg total) by mouth daily. 05/09/16   Vicie Mutters, PA-C  aspirin 81 MG tablet Take 81 mg by mouth daily.    Historical Provider, MD  cholecalciferol (VITAMIN D) 1000 UNITS tablet Take 7,000 Units by mouth at bedtime.     Historical Provider, MD  clonazePAM (KLONOPIN) 2 MG tablet Take 1 tablet (2 mg total) by mouth 3 (three) times daily as needed for anxiety. 05/09/16   Vicie Mutters, PA-C  furosemide (LASIX) 20 MG tablet Take 1-2 tablets (20-40 mg total) by mouth daily as needed for fluid. 03/12/16   Vicie Mutters, PA-C  gabapentin (NEURONTIN) 600 MG tablet Take 1/2 to 1 tablet 3 to 4 x day for neuritis pains 11/24/15   Unk Pinto, MD  MAGNESIUM PO Take 450 mg by mouth daily.    Historical Provider, MD      DRUG ALLERGIES: Allergies  Allergen Reactions  . Sulfa Antibiotics Hives and Itching     REVIEW OF SYSTEMS: CONSTITUTIONAL: No fatigue, weakness, fever, chills, weight gain/loss. Positive dizziness and headache EYES:Positive blurry vision. ENT: No tinnitus, postnasal drip, redness or soreness of the oropharynx. RESPIRATORY: Positive dyspnea. Negative cough, wheeze, hemoptysis. CARDIOVASCULAR: Positive chest pain, palpitations. No syncope GASTROINTESTINAL: No nausea, vomiting, constipation, diarrhea, abdominal pain. No hematemesis, melena or  hematochezia. GENITOURINARY: No dysuria, frequency, hematuria. ENDOCRINE: No polyuria or nocturia. No heat or cold intolerance. HEMATOLOGY: No anemia, bruising, bleeding. INTEGUMENTARY: No rashes, ulcers, lesions. MUSCULOSKELETAL: No pain, arthritis, swelling, gout. NEUROLOGIC: No numbness, tingling, weakness or ataxia. No seizure-type activity. PSYCHIATRIC: No anxiety, depression, insomnia.  PHYSICAL EXAMINATION: VITAL SIGNS: Blood pressure 124/66, pulse 73, temperature 97.7 F (36.5 C), temperature source Oral, resp. rate (!) 23, SpO2 98 %.  GENERAL: 56 y.o.-year-old female patient, well-developed, well-nourished lying in the bed in mild distress.  Pleasant and cooperative.   HEENT: Head atraumatic, normocephalic. Pupils equal, round, reactive to light and accommodation. No scleral icterus. Extraocular muscles intact. Oropharynx is clear. Mucus membranes moist. NECK: Supple, full range of motion. No JVD, no bruit heard. No cervical lymphadenopathy. CHEST: Normal breath sounds bilaterally. No wheezing, rales, rhonchi or crackles. No use of accessory muscles of respiration.  Positive reproducible chest wall tenderness.  CARDIOVASCULAR: S1, S2 normal. No murmurs, rubs, or gallops appreciated. Cap refill <2 seconds. ABDOMEN: Soft, nontender, nondistended. No rebound, guarding, rigidity. Normoactive bowel sounds present in all  four quadrants. No organomegaly or mass. EXTREMITIES: Full range of motion. No pedal edema, cyanosis, or clubbing. NEUROLOGIC: Cranial nerves II through XII are grossly intact with no focal sensorimotor deficit. Muscle strength 5/5 in all extremities. Sensation intact. Gait not checked. PSYCHIATRIC: The patient is alert and oriented x 3. Normal affect, mood, thought content. SKIN: Warm, dry, and intact without obvious rash, lesion, or ulcer.  LABORATORY PANEL:  CBC  Recent Labs Lab 06/30/16 2151  WBC 10.3  HGB 13.9  HCT 41.1  PLT 288    ----------------------------------------------------------------------------------------------------------------- Chemistries  Recent Labs Lab 06/30/16 2151  NA 139  K 4.2  CL 106  CO2 28  GLUCOSE 90  BUN 21*  CREATININE 0.88  CALCIUM 9.5   ------------------------------------------------------------------------------------------------------------------ Cardiac Enzymes No results for input(s): TROPONINI in the last 168 hours. ------------------------------------------------------------------------------------------------------------------  RADIOLOGY: Dg Chest 2 View  Result Date: 06/30/2016 CLINICAL DATA:  Central chest pain, onset last night. Shortness of breath. EXAM: CHEST  2 VIEW COMPARISON:  Radiograph 10/07/2015 FINDINGS: The cardiomediastinal contours are normal. An azygos fissure is noted. Pulmonary vasculature is normal. No consolidation, pleural effusion, or pneumothorax. No acute osseous abnormalities are seen. Surgical hardware in the lower cervical spine is partially included. Widening of the acromioclavicular joints is chronic and unchanged. IMPRESSION: No acute abnormality. Electronically Signed   By: Jeb Levering M.D.   On: 06/30/2016 22:25    EKG: Normal sinus rhythm at 73 bpm with rightward axis and nonspecific ST-T wave changes.   IMPRESSION AND PLAN:  This is a 56 y.o. female with a history of ADD, anxiety/depression, bladder cancer, HLD, HTN, hypothyroidism, iron deficiency anemia now being admitted with:  1. Chest pain, rule out ACS - Admit to observation with telemetry monitoring. - Trend troponins, check lipids and TSH. - Morphine, nitro, beta blocker aspirin ordered.   - Check echo - Consider cardiology consult - Duonebs for shortness of breath - Consider chest CT if not improving.   2. History of ADD - Continue Adderall  3. History of anxiety - Continue Klonopin  4. History of HTN - Monitor closely - Continue Lasix PRN  LE  edema  5. History of neuropathy - Continue gabapentin  Admission status: Observation Diet/Nutrition: Heart healthy Fluids: NS DVT Px: Heparin, SCDs and early ambulation Code Status: Full  All the records are reviewed and case discussed with ED provider. Management plans discussed with the patient and/or family who express understanding and agree with plan of care.   TOTAL TIME TAKING CARE OF THIS PATIENT: 60 minutes.   [name] D.O. on 07/01/2016 at 12:26 AM Between 7am to 6pm - Pager - [phone] After 6pm go to [email or web address] - [employer] [place] [facility] Office [phone] CC: Primary care physician; Alesia Richards, MD     Note: This dictation was prepared with Dragon dictation along with smaller phrase technology. Any transcriptional errors that result from this process are unintentional.

## 2016-07-01 NOTE — Progress Notes (Signed)
Patient discharged home with husband, discharge instructions given and explained to patient, she verbalized understanding, denies any pain/distress. No wound noted, skin intact. Acompanied home by husband.

## 2016-07-01 NOTE — ED Notes (Signed)
Hospitalist at bedside 

## 2016-07-01 NOTE — Care Management Note (Signed)
Case Management Note  Patient Details  Name: [name] MRN: [id number] Date of Birth: 05/21/1960  Subjective/Objective:55 y/o f admitted w/chest pain. From home. Cardio-possible myoview in am or otpt stress test.                    Action/Plan:d/c plan home.   Expected Discharge Date:                  Expected Discharge Plan:  Home/Self Care  In-House Referral:     Discharge planning Services  CM Consult  Post Acute Care Choice:    Choice offered to:     DME Arranged:    DME Agency:     HH Arranged:    HH Agency:     Status of Service:  In process, will continue to follow  If discussed at Long Length of Stay Meetings, dates discussed:    Additional Comments:  Dessa Phi, RN 07/01/2016, 1:31 PM

## 2016-07-01 NOTE — ED Notes (Signed)
This RN attempted IV access x2 without success.  Will have another RN look.  

## 2016-07-01 NOTE — Discharge Summary (Signed)
Discharge Summary  [name] KKX:[id number] DOB: 14-Nov-1960  PCP: Alesia Richards, MD  Admit date: 06/30/2016 Discharge date: 07/01/2016  Time spent: 25 minutes   Recommendations for Outpatient Follow-up:  1. New medication: Toradol 10 mg by mouth every 8 hours as needed. Total #10 pills 2. New medication: Metoprolol 12.5 by mouth twice a day 3. Patient will follow up with her PCP in the next one month 4. Patient encouraged to use incentive spirometer   Discharge Diagnoses:  Active Hospital Problems   Diagnosis Date Noted  . Chest pain, rule out acute myocardial infarction 07/01/2016  Anxiety Atelectasis or basilar Obesity   Resolved Hospital Problems   Diagnosis Date Noted Date Resolved  No resolved problems to display.    Discharge Condition: Improved, being discharged home   Diet recommendation: Regular diet   Vitals:   07/01/16 0959 07/01/16 1445  BP: (!) 120/42 (!) 107/47  Pulse: 63 69  Resp:  19  Temp:  98.1 F (36.7 C)    History of present illness:  56 year old female with past medical history of fibromyalgia, ADHD and obesity presented to the emergency room on 3/25 evening with complaints of chest pain. She described it as a heaviness, but surrounding her entire chest. This was worse whenever she would sit up or leaning forward, this was most prominent whenever she would take a deep breath in. Chest x-ray initial blood work was unrevealing. Patient had a heart score force was felt that she come in for further observation.  Hospital Course:  Active Problems: Atypical chest pain: Cranial consulted. EKG and enzymes negative. With physical exam, pain noticeably worse with deep inspiration and not necessarily with exertion. Not reproducible and chest wall not truly tender. She seemed improved with a dose of IV Toradol. Beta blocker started which helped the patient feel more calm. ABG only noted a minimally less than normal PO2 and patient was having an  oxygen saturation greater than 90% on room air. Given concerns for possible pleurisy, chest CT check. Noted to have some bibasilar atelectasis. Extensive discussion with cardiologist and with patient and likely patient's symptoms may be accommodation of anxiety as well as this atelectasis. Provided in spirometer and continued beta blocker plus some few pills when necessary Toradol. Patient not felt to have anything cardiac or pulmonary in nature. Provided reassurance and she'll follow up with her PCP in the next month.  Obesity: Patient needs criteria with BMI greater than 30  ADHD: Meds held on admission  Anxiety/depression: See above. Likely playing a role in this.   Procedures:  None   Consultations:  Cardiology   Discharge Exam: BP (!) 107/47 (BP Location: Left Arm)   Pulse 69   Temp 98.1 F (36.7 C) (Oral)   Resp 19   Ht 5\' 3"  (1.6 m)   Wt 89.5 kg (197 lb 5 oz)   SpO2 98%   BMI 34.95 kg/m   General: Alert and oriented 3, no acute distress currently  Cardiovascular: Regular rate and rhythm, S1-S2  Respiratory: Decreased breath sounds bibasilar   Discharge Instructions You were cared for by a hospitalist during your hospital stay. If you have any questions about your discharge medications or the care you received while you were in the hospital after you are discharged, you can call the unit and asked to speak with the hospitalist on call if the hospitalist that took care of you is not available. Once you are discharged, your primary care physician will handle any further medical issues.  Please note that NO REFILLS for any discharge medications will be authorized once you are discharged, as it is imperative that you return to your primary care physician (or establish a relationship with a primary care physician if you do not have one) for your aftercare needs so that they can reassess your need for medications and monitor your lab values.  Discharge Instructions    Diet - low  sodium heart healthy    Complete by:  As directed    Increase activity slowly    Complete by:  As directed      Allergies as of 07/01/2016      Reactions   Sulfa Antibiotics Hives, Itching      Medication List    STOP taking these medications   furosemide 20 MG tablet Commonly known as:  LASIX     TAKE these medications   albuterol 108 (90 Base) MCG/ACT inhaler Commonly known as:  VENTOLIN HFA Inhale 2 puffs into the lungs every 4 (four) hours as needed for wheezing or shortness of breath.   amphetamine-dextroamphetamine 20 MG tablet Commonly known as:  ADDERALL Take 1 tablet (20 mg total) by mouth daily. What changed:  how much to take  when to take this   aspirin 81 MG tablet Take 81 mg by mouth daily.   cholecalciferol 1000 units tablet Commonly known as:  VITAMIN D Take 7,000 Units by mouth at bedtime.   clonazePAM 2 MG tablet Commonly known as:  KLONOPIN Take 1 tablet (2 mg total) by mouth 3 (three) times daily as needed for anxiety. What changed:  when to take this   cyclobenzaprine 5 MG tablet Commonly known as:  FLEXERIL Take 5-10 mg by mouth daily as needed for muscle spasms.   gabapentin 100 MG capsule Commonly known as:  NEURONTIN Take 100 mg by mouth 3 (three) times daily as needed. Fibromyalgia pain   ketorolac 10 MG tablet Commonly known as:  TORADOL Take 1 tablet (10 mg total) by mouth every 8 (eight) hours.   MAGNESIUM PO Take 450 mg by mouth daily.   meloxicam 15 MG tablet Commonly known as:  MOBIC Take 15 mg by mouth daily as needed for pain.   metoprolol tartrate 25 MG tablet Commonly known as:  LOPRESSOR Take 0.5 tablets (12.5 mg total) by mouth 2 (two) times daily.   traMADol 50 MG tablet Commonly known as:  ULTRAM Take 50 mg by mouth every 8 (eight) hours as needed for moderate pain or severe pain.      Allergies  Allergen Reactions  . Sulfa Antibiotics Hives and Itching   Follow-up Information    MCKEOWN,WILLIAM  DAVID, MD Follow up in 1 month(s).   Specialty:  Internal Medicine Contact information: [address] [place] [address] [phone]            The results of significant diagnostics from this hospitalization (including imaging, microbiology, ancillary and laboratory) are listed below for reference.    Significant Diagnostic Studies: Dg Chest 2 View  Result Date: 06/30/2016 CLINICAL DATA:  Central chest pain, onset last night. Shortness of breath. EXAM: CHEST  2 VIEW COMPARISON:  Radiograph 10/07/2015 FINDINGS: The cardiomediastinal contours are normal. An azygos fissure is noted. Pulmonary vasculature is normal. No consolidation, pleural effusion, or pneumothorax. No acute osseous abnormalities are seen. Surgical hardware in the lower cervical spine is partially included. Widening of the acromioclavicular joints is chronic and unchanged. IMPRESSION: No acute abnormality. Electronically Signed   By: Threasa Beards  Ehinger M.D.   On: 06/30/2016 22:25   Ct Chest W Contrast  Result Date: 07/01/2016 CLINICAL DATA:  Substernal chest pain since yesterday with diaphoresis and anxiety. EXAM: CT CHEST WITH CONTRAST TECHNIQUE: Multidetector CT imaging of the chest was performed during intravenous contrast administration. CONTRAST:  1 ISOVUE-300 IOPAMIDOL (ISOVUE-300) INJECTION 61% COMPARISON:  CT abdomen/pelvis 05/18/2015 FINDINGS: Chest wall: No breast masses, supraclavicular or axillary lymphadenopathy. Small scattered lymph nodes are noted. The thyroid gland appears normal. Cardiovascular: The heart is normal in size. No pericardial effusion. The aorta is normal in caliber. No dissection. Atherosclerotic calcifications are noted at the aortic arch. The branch vessels are patent. No obvious coronary artery calcifications. Mediastinum/Nodes: Small scattered mediastinal and hilar lymph nodes but no mass or overt adenopathy. The esophagus is grossly normal. Lungs/Pleura: Azygos fissure  noted. Patchy dependent subpleural atelectasis but no focal infiltrates, edema or effusions. No worrisome pulmonary lesions. Upper Abdomen: No significant findings. Status post cholecystectomy with mild stable common bile duct dilatation. Small bilateral adrenal gland adenomas are noted. Musculoskeletal: No significant bony findings. IMPRESSION: No acute findings in the chest. Dependent subpleural atelectasis but no infiltrates, edema or effusions. Scattered small mediastinal and hilar lymph nodes but no mass or overt adenopathy. Minimal atherosclerotic calcifications involving the thoracic aorta. No aneurysm or dissection. Electronically Signed   By: Marijo Sanes M.D.   On: 07/01/2016 13:37    Microbiology: No results found for this or any previous visit (from the past 240 hour(s)).   Labs: Basic Metabolic Panel:  Recent Labs Lab 06/30/16 2151  NA 139  K 4.2  CL 106  CO2 28  GLUCOSE 90  BUN 21*  CREATININE 0.88  CALCIUM 9.5   Liver Function Tests: No results for input(s): AST, ALT, ALKPHOS, BILITOT, PROT, ALBUMIN in the last 168 hours. No results for input(s): LIPASE, AMYLASE in the last 168 hours. No results for input(s): AMMONIA in the last 168 hours. CBC:  Recent Labs Lab 06/30/16 2151  WBC 10.3  HGB 13.9  HCT 41.1  MCV 87.3  PLT 288   Cardiac Enzymes:  Recent Labs Lab 07/01/16 0341 07/01/16 0640 07/01/16 0954  TROPONINI <0.03 <0.03 <0.03   BNP: BNP (last 3 results) No results for input(s): BNP in the last 8760 hours.  ProBNP (last 3 results) No results for input(s): PROBNP in the last 8760 hours.  CBG: No results for input(s): GLUCAP in the last 168 hours.     Signed:  Annita Brod, MD [facility] 07/01/2016, 5:47 PM

## 2016-07-02 LAB — HIV ANTIBODY (ROUTINE TESTING W REFLEX): HIV Screen 4th Generation wRfx: NONREACTIVE

## 2016-07-11 ENCOUNTER — Encounter: Payer: Self-pay | Admitting: Physician Assistant

## 2016-07-11 ENCOUNTER — Ambulatory Visit (INDEPENDENT_AMBULATORY_CARE_PROVIDER_SITE_OTHER): Payer: 59 | Admitting: Physician Assistant

## 2016-07-11 VITALS — BP 120/66 | HR 71 | Temp 97.3°F | Resp 14 | Ht 64.0 in | Wt 205.6 lb

## 2016-07-11 DIAGNOSIS — R079 Chest pain, unspecified: Secondary | ICD-10-CM

## 2016-07-11 DIAGNOSIS — M797 Fibromyalgia: Secondary | ICD-10-CM

## 2016-07-11 DIAGNOSIS — J449 Chronic obstructive pulmonary disease, unspecified: Secondary | ICD-10-CM | POA: Diagnosis not present

## 2016-07-11 DIAGNOSIS — IMO0001 Reserved for inherently not codable concepts without codable children: Secondary | ICD-10-CM

## 2016-07-11 DIAGNOSIS — Z6835 Body mass index (BMI) 35.0-35.9, adult: Secondary | ICD-10-CM | POA: Diagnosis not present

## 2016-07-11 DIAGNOSIS — F329 Major depressive disorder, single episode, unspecified: Secondary | ICD-10-CM | POA: Diagnosis not present

## 2016-07-11 DIAGNOSIS — E6609 Other obesity due to excess calories: Secondary | ICD-10-CM

## 2016-07-11 DIAGNOSIS — F32A Depression, unspecified: Secondary | ICD-10-CM

## 2016-07-11 MED ORDER — KETOROLAC TROMETHAMINE 10 MG PO TABS: 10.0000 mg | ORAL_TABLET | Freq: Three times a day (TID) | ORAL | 1 refills | Status: DC

## 2016-07-11 MED ORDER — DULOXETINE HCL 30 MG PO CPEP: 30.0000 mg | ORAL_CAPSULE | Freq: Every day | ORAL | 3 refills | Status: DC

## 2016-07-11 MED ORDER — AMPHETAMINE-DEXTROAMPHETAMINE 20 MG PO TABS: 20.0000 mg | ORAL_TABLET | Freq: Every day | ORAL | 0 refills | Status: DC

## 2016-07-11 MED ORDER — CLONAZEPAM 2 MG PO TABS: 2.0000 mg | ORAL_TABLET | Freq: Three times a day (TID) | ORAL | 0 refills | Status: DC | PRN

## 2016-07-11 NOTE — Progress Notes (Signed)
Hospital follow up  Assessment and Plan: Hospital visit follow up for chest pain:   Chest pain, rule out acute myocardial infarction Atypical, normal CT chest, normal enzymes, nonexertional Will continue to monitor Continue ASA Pain is more right neck with radicular symptoms along C6-C7, has follow up with ortho  Class 2 obesity due to excess calories with serious comorbidity and body mass index (BMI) of 35.0 to 35.9 in adult - long discussion about weight loss, diet, and exercise  Fibromyalgia -     DULoxetine (CYMBALTA) 30 MG capsule; Take 1 capsule (30 mg total) by mouth daily. -     ketorolac (TORADOL) 10 MG tablet; Take 1 tablet (10 mg total) by mouth every 8 (eight) hours. -     clonazePAM (KLONOPIN) 2 MG tablet; Take 1 tablet (2 mg total) by mouth 3 (three) times daily as needed for anxiety. -     amphetamine-dextroamphetamine (ADDERALL) 20 MG tablet; Take 1 tablet (20 mg total) by mouth daily.  Chronic obstructive pulmonary disease, unspecified COPD type ([place]) Continue spirometer Will monitor  Depression, unspecified depression type/anxiety -     DULoxetine (CYMBALTA) 30 MG capsule; Take 1 capsule (30 mg total) by mouth daily. -     clonazePAM (KLONOPIN) 2 MG tablet; Take 1 tablet (2 mg total) by mouth 3 (three) times daily as needed for anxiety. -     amphetamine-dextroamphetamine (ADDERALL) 20 MG tablet; Take 1 tablet (20 mg total) by mouth daily. - will add on cymbalta for possible FM and anxiety, follow up 1 month   Hospital discharge meds were reviewed, and reconciled with the patient.   Medications Discontinued During This Encounter  Medication Reason  . cyclobenzaprine (FLEXERIL) 5 MG tablet Completed Course  . metoprolol tartrate (LOPRESSOR) 25 MG tablet Completed Course  . meloxicam (MOBIC) 15 MG tablet   . ketorolac (TORADOL) 10 MG tablet Reorder  . clonazePAM (KLONOPIN) 2 MG tablet Reorder  . amphetamine-dextroamphetamine (ADDERALL) 20 MG tablet Reorder     Over 40 minutes of exam, counseling, chart review, and complex, high/moderate level critical decision making was performed this visit.   Future Appointments Date Time Provider [place]  08/14/2016 9:00 AM Vicie Mutters, PA-C [id number] None    HPI 56 y.o.female presents for follow up for transition from recent hospitalization or [facility]IF stay. Admit date to the hospital was 06/30/16, patient was discharged from the hospital on 07/01/16 and our clinical staff contacted the office the day after discharge to set up a follow up appointment, patient was admitted for: Chest pain.  She went to the ER 03/25 for chest pain, heaviness worse with leaning forward/sitting up and deep breath. She had a normal CXR, normal cardiac enzymes, negative Ddimer, CT chest showed atelectasis, she was given spirometer, toradol, and BB. States she had stopped klonopin and adderall 3 days before this happens, now back on both and states she feels more balanced.   She will have right sided neck pain that the toradol helps, throbbing pain that radiates into right shoulder, worse with leaning forward or leaning all the way back. No right ear symptoms, will have some vertigo with turning to the right, has some right arm tingling.   Images while in the hospital: Dg Chest 2 View  Result Date: 06/30/2016 CLINICAL DATA:  Central chest pain, onset last night. Shortness of breath. EXAM: CHEST  2 VIEW COMPARISON:  Radiograph 10/07/2015 FINDINGS: The cardiomediastinal contours are normal. An azygos fissure is noted. Pulmonary vasculature is normal. No consolidation,  pleural effusion, or pneumothorax. No acute osseous abnormalities are seen. Surgical hardware in the lower cervical spine is partially included. Widening of the acromioclavicular joints is chronic and unchanged. IMPRESSION: No acute abnormality. Electronically Signed   By: Jeb Levering M.D.   On: 06/30/2016 22:25   Ct Chest W Contrast  Result Date:  07/01/2016 CLINICAL DATA:  Substernal chest pain since yesterday with diaphoresis and anxiety. EXAM: CT CHEST WITH CONTRAST TECHNIQUE: Multidetector CT imaging of the chest was performed during intravenous contrast administration. CONTRAST:  1 ISOVUE-300 IOPAMIDOL (ISOVUE-300) INJECTION 61% COMPARISON:  CT abdomen/pelvis 05/18/2015 FINDINGS: Chest wall: No breast masses, supraclavicular or axillary lymphadenopathy. Small scattered lymph nodes are noted. The thyroid gland appears normal. Cardiovascular: The heart is normal in size. No pericardial effusion. The aorta is normal in caliber. No dissection. Atherosclerotic calcifications are noted at the aortic arch. The branch vessels are patent. No obvious coronary artery calcifications. Mediastinum/Nodes: Small scattered mediastinal and hilar lymph nodes but no mass or overt adenopathy. The esophagus is grossly normal. Lungs/Pleura: Azygos fissure noted. Patchy dependent subpleural atelectasis but no focal infiltrates, edema or effusions. No worrisome pulmonary lesions. Upper Abdomen: No significant findings. Status post cholecystectomy with mild stable common bile duct dilatation. Small bilateral adrenal gland adenomas are noted. Musculoskeletal: No significant bony findings. IMPRESSION: No acute findings in the chest. Dependent subpleural atelectasis but no infiltrates, edema or effusions. Scattered small mediastinal and hilar lymph nodes but no mass or overt adenopathy. Minimal atherosclerotic calcifications involving the thoracic aorta. No aneurysm or dissection. Electronically Signed   By: Marijo Sanes M.D.   On: 07/01/2016 13:37    Past Medical History:  Diagnosis Date  . ADD (attention deficit disorder)   . Anxiety   . Bladder neoplasm   . Cancer [facility]) 2015   Bladder cancer  . COPD (chronic obstructive pulmonary disease) ([place])   . Depression   . Fibroids   . Fibromyalgia   . H/O bronchitis    treated within the last week w steroids, antibx  tessalon pearls.   . H/O sebaceous cyst 07/18/14   left breast   . Hemorrhoid   . History of melanoma excision    leg  . Hyperlipidemia   . Hypertension    normal now, only took medications for 1 month- thinks it was from pain.  Marland Kitchen Hypothyroidism   . Iron deficiency anemia   . PONV (postoperative nausea and vomiting)    used a scop patch last surg-did well  . Prediabetes   . Restless leg syndrome   . Shortness of breath dyspnea    with exertion  . Shoulder impingement    right  . SUI (stress urinary incontinence, female)   . Wears glasses      Allergies  Allergen Reactions  . Sulfa Antibiotics Hives and Itching      Current Outpatient Prescriptions on File Prior to Visit  Medication Sig Dispense Refill  . albuterol (VENTOLIN HFA) 108 (90 Base) MCG/ACT inhaler Inhale 2 puffs into the lungs every 4 (four) hours as needed for wheezing or shortness of breath. 1 Inhaler 3  . aspirin 81 MG tablet Take 81 mg by mouth daily.    . cholecalciferol (VITAMIN D) 1000 UNITS tablet Take 7,000 Units by mouth at bedtime.     . gabapentin (NEURONTIN) 100 MG capsule Take 100 mg by mouth 3 (three) times daily as needed. Fibromyalgia pain    . MAGNESIUM PO Take 450 mg by mouth daily.    Marland Kitchen  traMADol (ULTRAM) 50 MG tablet Take 50 mg by mouth every 8 (eight) hours as needed for moderate pain or severe pain.      No current facility-administered medications on file prior to visit.     ROS: all negative except above.   Physical Exam: Filed Weights   07/11/16 0921  Weight: 205 lb 9.6 oz (93.3 kg)   BP 120/66   Pulse 71   Temp 97.3 F (36.3 C)   Resp 14   Ht 5\' 4"  (1.626 m)   Wt 205 lb 9.6 oz (93.3 kg)   SpO2 98%   BMI 35.29 kg/m  General Appearance: Well nourished, in no apparent distress. Eyes: PERRLA, EOMs, conjunctiva no swelling or erythema Sinuses: No Frontal/maxillary tenderness ENT/Mouth: Ext aud canals clear, TMs without erythema, bulging. No erythema, swelling, or exudate on  post pharynx.  Tonsils not swollen or erythematous. Hearing normal.  Neck: Supple, thyroid normal.  Respiratory: Respiratory effort normal, BS equal bilaterally without rales, rhonchi, wheezing or stridor.  Cardio: RRR with no MRGs. Brisk peripheral pulses without edema.  Abdomen: Soft, + BS,  Non tender, no guarding, rebound, hernias, masses. Lymphatics: Non tender without lymphadenopathy.  Musculoskeletal: Full ROM, 5/5 strength, Antalgic gait, right shoulder with anterior pain on palpation, decrease sensation C6-C7 distribution more C6, good reflexes Skin: Warm, dry without rashes, lesions, ecchymosis.  Neuro: Cranial nerves intact. Normal muscle tone, no cerebellar symptoms. Psych: Awake and oriented X 3, normal affect, Insight and Judgment appropriate.     Vicie Mutters, PA-C 10:10 AM [facility]

## 2016-07-11 NOTE — Patient Instructions (Signed)
Living With Depression Everyone experiences occasional disappointment, sadness, and loss in their lives. When you are feeling down, blue, or sad for at least 2 weeks in a row, it may mean that you have depression. Depression can affect your thoughts and feelings, relationships, daily activities, and physical health. It is caused by changes in the way your brain functions. If you receive a diagnosis of depression, your health care provider will tell you which type of depression you have and what treatment options are available to you. If you are living with depression, there are ways to help you recover from it and also ways to prevent it from coming back. How to cope with lifestyle changes Coping with stress  Stress is your body's reaction to life changes and events, both good and bad. Stressful situations may include:  Getting married.  The death of a spouse.  Losing a job.  Retiring.  Having a baby. Stress can last just a few hours or it can be ongoing. Stress can play a major role in depression, so it is important to learn both how to cope with stress and how to think about it differently. Talk with your health care provider or a counselor if you would like to learn more about stress reduction. He or she may suggest some stress reduction techniques, such as:  Music therapy. This can include creating music or listening to music. Choose music that you enjoy and that inspires you.  Mindfulness-based meditation. This kind of meditation can be done while sitting or walking. It involves being aware of your normal breaths, rather than trying to control your breathing.  Centering prayer. This is a kind of meditation that involves focusing on a spiritual word or phrase. Choose a word, phrase, or sacred image that is meaningful to you and that brings you peace.  Deep breathing. To do this, expand your stomach and inhale slowly through your nose. Hold your breath for 3-5 seconds, then exhale slowly,  allowing your stomach muscles to relax.  Muscle relaxation. This involves intentionally tensing muscles then relaxing them. Choose a stress reduction technique that fits your lifestyle and personality. Stress reduction techniques take time and practice to develop. Set aside 5-15 minutes a day to do them. Therapists can offer training in these techniques. The training may be covered by some insurance plans. Other things you can do to manage stress include:  Keeping a stress diary. This can help you learn what triggers your stress and ways to control your response.  Understanding what your limits are and saying no to requests or events that lead to a schedule that is too full.  Thinking about how you respond to certain situations. You may not be able to control everything, but you can control how you react.  Adding humor to your life by watching funny films or TV shows.  Making time for activities that help you relax and not feeling guilty about spending your time this way. Medicines  Your health care provider may suggest certain medicines if he or she feels that they will help improve your condition. Avoid using alcohol and other substances that may prevent your medicines from working properly (may interact). It is also important to:  Talk with your pharmacist or health care provider about all the medicines that you take, their possible side effects, and what medicines are safe to take together.  Make it your goal to take part in all treatment decisions (shared decision-making). This includes giving input on the side effects   of medicines. It is best if shared decision-making with your health care provider is part of your total treatment plan. If your health care provider prescribes a medicine, you may not notice the full benefits of it for 4-8 weeks. Most people who are treated for depression need to be on medicine for at least 6-12 months after they feel better. If you are taking medicines as  part of your treatment, do not stop taking medicines without first talking to your health care provider. You may need to have the medicine slowly decreased (tapered) over time to decrease the risk of harmful side effects. Relationships  Your health care provider may suggest family therapy along with individual therapy and drug therapy. While there may not be family problems that are causing you to feel depressed, it is still important to make sure your family learns as much as they can about your mental health. Having your family's support can help make your treatment successful. How to recognize changes in your condition Everyone has a different response to treatment for depression. Recovery from major depression happens when you have not had signs of major depression for two months. This may mean that you will start to:  Have more interest in doing activities.  Feel less hopeless than you did 2 months ago.  Have more energy.  Overeat less often, or have better or improving appetite.  Have better concentration. Your health care provider will work with you to decide the next steps in your recovery. It is also important to recognize when your condition is getting worse. Watch for these signs:  Having fatigue or low energy.  Eating too much or too little.  Sleeping too much or too little.  Feeling restless, agitated, or hopeless.  Having trouble concentrating or making decisions.  Having unexplained physical complaints.  Feeling irritable, angry, or aggressive. Get help as soon as you or your family members notice these symptoms coming back. How to get support and help from others How to talk with friends and family members about your condition  Talking to friends and family members about your condition can provide you with one way to get support and guidance. Reach out to trusted friends or family members, explain your symptoms to them, and let them know that you are working with a  health care provider to treat your depression. Financial resources  Not all insurance plans cover mental health care, so it is important to check with your insurance carrier. If paying for co-pays or counseling services is a problem, search for a local or county mental health care center. They may be able to offer public mental health care services at low or no cost when you are not able to see a private health care provider. If you are taking medicine for depression, you may be able to get the generic form, which may be less expensive. Some makers of prescription medicines also offer help to patients who cannot afford the medicines they need. Follow these instructions at home:  Get the right amount and quality of sleep.  Cut down on using caffeine, tobacco, alcohol, and other potentially harmful substances.  Try to exercise, such as walking or lifting small weights.  Take over-the-counter and prescription medicines only as told by your health care provider.  Eat a healthy diet that includes plenty of vegetables, fruits, whole grains, low-fat dairy products, and lean protein. Do not eat a lot of foods that are high in solid fats, added sugars, or salt.  Keep   all follow-up visits as told by your health care provider. This is important. Contact a health care provider if:  You stop taking your antidepressant medicines, and you have any of these symptoms:  Nausea.  Headache.  Feeling lightheaded.  Chills and body aches.  Not being able to sleep (insomnia).  You or your friends and family think your depression is getting worse. Get help right away if:  You have thoughts of hurting yourself or others. If you ever feel like you may hurt yourself or others, or have thoughts about taking your own life, get help right away. You can go to your nearest emergency department or call:  Your local emergency services (911 in the [place]  A suicide crisis helpline, such as the [facility] at (463)859-3600. This is open 24-hours a day. Summary  If you are living with depression, there are ways to help you recover from it and also ways to prevent it from coming back.  Work with your health care team to create a management plan that includes counseling, stress management techniques, and healthy lifestyle habits. This information is not intended to replace advice given to you by your health care provider. Make sure you discuss any questions you have with your health care provider. Document Released: 02/26/2016 Document Revised: 02/26/2016 Document Reviewed: 02/26/2016 Elsevier Interactive Patient Education  2017 [employer].   What is the TMJ? The temporomandibular (tem-PUH-[id number]-man-DIB-[id number]-ler) joint, or the TMJ, connects the upper and lower jawbones. This joint allows the jaw to open wide and move back and forth when you chew, talk, or yawn.There are also several muscles that help this joint move. There can be muscle tightness and pain in the muscle that can cause several symptoms.  What causes TMJ pain? There are many causes of TMJ pain. Repeated chewing (for example, chewing gum) and clenching your teeth can cause pain in the joint. Some TMJ pain has no obvious cause. What can I do to ease the pain? There are many things you can do to help your pain get better. When you have pain:  Eat soft foods and stay away from chewy foods (for example, taffy) Try to use both sides of your mouth to chew Don't chew gum Massage Don't open your mouth wide (for example, during yawning or singing) Don't bite your cheeks or fingernails Lower your amount of stress and worry Applying a warm, damp washcloth to the joint may help. Over-the-counter pain medicines such as ibuprofen (one brand: Advil) or acetaminophen (one brand: Tylenol) might also help. Do not use these medicines if you are allergic to them or if your doctor told you not to use them. How can I stop the pain  from coming back? When your pain is better, you can do these exercises to make your muscles stronger and to keep the pain from coming back:  Resisted mouth opening: Place your thumb or two fingers under your chin and open your mouth slowly, pushing up lightly on your chin with your thumb. Hold for three to six seconds. Close your mouth slowly. Resisted mouth closing: Place your thumbs under your chin and your two index fingers on the ridge between your mouth and the bottom of your chin. Push down lightly on your chin as you close your mouth. Tongue up: Slowly open and close your mouth while keeping the tongue touching the roof of the mouth. Side-to-side jaw movement: Place an object about one fourth of an inch thick (for example, two tongue depressors)  between your front teeth. Slowly move your jaw from side to side. Increase the thickness of the object as the exercise becomes easier Forward jaw movement: Place an object about one fourth of an inch thick between your front teeth and move the bottom jaw forward so that the bottom teeth are in front of the top teeth. Increase the thickness of the object as the exercise becomes easier. These exercises should not be painful. If it hurts to do these exercises, stop doing them and talk to your family doctor.

## 2016-08-13 NOTE — Progress Notes (Deleted)
Complete Physical Assessment and Plan: Essential hypertension - continue medications, DASH diet, exercise and monitor at home. Call if greater than 130/80.  - CBC with Differential/Platelet - BASIC METABOLIC PANEL WITH GFR - TSH - Hepatic function panel - Microalbumin / creatinine urine ratio - EKG 12-Lead - Urinalysis, Routine w reflex microscopic  Chronic obstructive pulmonary disease, unspecified COPD, unspecified chronic bronchitis type Q smoking 2011, no need for ABX at this time and patient agrees.  - DG Chest 2 View; Future - albuterol (VENTOLIN HFA) 108 (90 BASE) MCG/ACT inhaler; Inhale 2 puffs into the lungs every 4 (four) hours as needed for wheezing or shortness of breath.  Dispense: 1 Inhaler; Refill: 3 - montelukast (SINGULAIR) 10 MG tablet; Take 1 tablet (10 mg total) by mouth daily.  Dispense: 30 tablet; Refill: 2 - benzonatate (TESSALON PERLES) 100 MG capsule; Take 2 capsules (200 mg total) by mouth 3 (three) times daily as needed for cough (Max: 600mg  per day (6 capsules per day)).  Dispense: 120 capsule; Refill: 0   Gastroesophageal reflux disease with esophagitis Continue PPI/H2 blocker, diet discussed  Prediabetes Discussed general issues about diabetes pathophysiology and management., Educational material distributed., Suggested low cholesterol diet., Encouraged aerobic exercise., Discussed foot care., Reminded to get yearly retinal exam. - Hemoglobin A1c - Insulin, fasting  Thyroid disease Hypothyroidism-check TSH level, continue medications the same, reminded to take on an empty stomach 30-7mins before food.  - levothyroxine (SYNTHROID, LEVOTHROID) 75 MCG tablet; Take 1 tablet (75 mcg total) by mouth daily before breakfast.  Dispense: 90 tablet; Refill: 0  Peripheral sensory neuropathy Continue medications and follow up  Fibromyalgia Continue follow up with ortho, declines pain management at this time - gabapentin (NEURONTIN) 300 MG capsule; Take 1 capsule  (300 mg total) by mouth 3 (three) times daily.  Dispense: 90 capsule; Refill: 2 - amphetamine-dextroamphetamine (ADDERALL) 20 MG tablet; Take 1 tablet (20 mg total) by mouth 2 (two) times daily.  Dispense: 60 tablet; Refill: 0  Hyperlipidemia -continue medications, check lipids, decrease fatty foods, increase activity. - Lipid panel  Bipolar 2 disorder, major depressive episode ? Biopolar, + pressured speech but no pure manic episodes, strongly encouraged to go to psych for complete evaluation. No SI/HI   Restless leg syndrome Continue klonopin - Magnesium  Cancer Bladder cancer, continue follow up with Dr. Junious Silk.   Estrogen deficiency - DG Bone Density; Future  Obesity Obesity with co morbidities- long discussion about weight loss, diet, and exercise  Vitamin D deficiency - Vit D  25 hydroxy (rtn osteoporosis monitoring)  Thoracic aorta atherosclerosis Control blood pressure, cholesterol, glucose, increase exercise.    ADD (attention deficit disorder) - amphetamine-dextroamphetamine (ADDERALL) 20 MG tablet; Take 1 tablet (20 mg total) by mouth 2 (two) times daily.  Dispense: 60 tablet; Refill: 0   Depression/Bipolar Depression- continue medications for now, continue meds and start on depakote for possible bipolar tendency STRONGLY suggested patient to follow up with psych, she has OCD/anxiety/bipolar, will call ASAP, close follow up here.  No SI/HI   HPI 56 y.o. female  presents for a complete physical. Her blood pressure has been controlled at home, today their BP is   She does not workout. She denies chest pain, shortness of breath, dizziness.  She is not on cholesterol medication and denies myalgias. Her cholesterol is at goal. The cholesterol last visit was:  Lab Results  Component Value Date   CHOL 193 03/12/2016   HDL 61 03/12/2016   LDLCALC 114 (H) 03/12/2016  TRIG 91 03/12/2016   CHOLHDL 3.2 03/12/2016  She has not been working on diet and exercise for  prediabetes, and denies hyperglycemia, nausea, polydipsia and polyuria. Last A1C in the office was:  Lab Results  Component Value Date   HGBA1C 5.4 03/12/2016  Patient is on Vitamin D supplement.   Lab Results  Component Value Date   VD25OH 60 08/14/2015  She is on thyroid medication. Her medication was not changed last visit. Patient denies diarrhea, heat / cold intolerance and palpitations.  Lab Results  Component Value Date   TSH 3.43 03/12/2016  She has fibromyalgia and RLS with general paresthesias, has followed with Dr. Posey Pronto in neuro as well- she states the klonopin helps her sleep and helps her mood, her adderall helps with energy/brain fog, the lyrica has helped her pain. She is on adderall for ADD/depression which helps.  She has bipolar/depression/OCD tendencies- she states she cries a lot and has a lot of anger issues- feels that she would like treatment, has had diagnosis of bipolar, only on klonopin for treatment. She is on klonopin and lyrica for fibromyalgia and anxiety. Denies SI/HI.  She had bladder tumor resection and stent placement with Dr. Junious Silk in 2015, also had negative genetic testing with her Ob/GYN.   History of 30 pack year smoking history (Q 2011). Has history of COPD from smoking, has been worse with recent allergies with wheezing, coughing. Denies fever or chills.  She had recent right shoulder surgery 07/2015 and most recent anterior neck decompression on 08/02/2015 with Dr. Lynann Bologna. She is having some trouble swallowing, will follow up Wednesday.  She did follow up with Dr. Trudie Reed but had a negtiave RA work up. She is working on disability and is not on working at this time.  BMI is There is no height or weight on file to calculate BMI., she is working on diet and exercise. Wt Readings from Last 3 Encounters:  07/11/16 205 lb 9.6 oz (93.3 kg)  07/01/16 197 lb 5 oz (89.5 kg)  05/23/16 199 lb (90.3 kg)     Current Medications:  Current Outpatient  Prescriptions on File Prior to Visit  Medication Sig Dispense Refill  . albuterol (VENTOLIN HFA) 108 (90 Base) MCG/ACT inhaler Inhale 2 puffs into the lungs every 4 (four) hours as needed for wheezing or shortness of breath. 1 Inhaler 3  . amphetamine-dextroamphetamine (ADDERALL) 20 MG tablet Take 1 tablet (20 mg total) by mouth daily. 30 tablet 0  . aspirin 81 MG tablet Take 81 mg by mouth daily.    . cholecalciferol (VITAMIN D) 1000 UNITS tablet Take 7,000 Units by mouth at bedtime.     . clonazePAM (KLONOPIN) 2 MG tablet Take 1 tablet (2 mg total) by mouth 3 (three) times daily as needed for anxiety. 90 tablet 0  . DULoxetine (CYMBALTA) 30 MG capsule Take 1 capsule (30 mg total) by mouth daily. 30 capsule 3  . gabapentin (NEURONTIN) 100 MG capsule Take 100 mg by mouth 3 (three) times daily as needed. Fibromyalgia pain    . ketorolac (TORADOL) 10 MG tablet Take 1 tablet (10 mg total) by mouth every 8 (eight) hours. 90 tablet 1  . MAGNESIUM PO Take 450 mg by mouth daily.    . traMADol (ULTRAM) 50 MG tablet Take 50 mg by mouth every 8 (eight) hours as needed for moderate pain or severe pain.      No current facility-administered medications on file prior to visit.    Health Maintenance:  Immunization History  Administered Date(s) Administered  . Influenza Split 01/14/2014, 01/13/2015  . Influenza, Seasonal, Injecte, Preservative Fre 03/12/2016  . Pneumococcal Polysaccharide-23 09/02/2013  . Tdap 06/19/2012   Tetanus: 2015 Pneumovax: 2015 Prevnar 13: Flu vaccine: 01/2015 Zostavax: N/A  Pap: 07/2014  MGM: 02/2014, STATES HAD ONE LAST YEAR, DUE THIS YEAR DEXA: N/A- broke sacrum while falling, last menses 2006, former smoker.  Colonoscopy: 06/2011 7 polyps Dr. Deatra Ina EGD: N/A US Pelvis 07/2014 CXR 07/2015 Ct AB pelvis 06/2015 VAS [place] lower legs 06/2015  Patient Care Team: Unk Pinto, MD as PCP - General (Internal Medicine) Festus Aloe, MD as Consulting Physician  (Urology) Inda Castle, MD as Consulting Physician (Gastroenterology)  Medical History:  Past Medical History:  Diagnosis Date  . ADD (attention deficit disorder)   . Anxiety   . Bladder neoplasm   . Cancer [facility]) 2015   Bladder cancer  . COPD (chronic obstructive pulmonary disease) ([place])   . Depression   . Fibroids   . Fibromyalgia   . H/O bronchitis    treated within the last week w steroids, antibx tessalon pearls.   . H/O sebaceous cyst 07/18/14   left breast   . Hemorrhoid   . History of melanoma excision    leg  . Hyperlipidemia   . Hypertension    normal now, only took medications for 1 month- thinks it was from pain.  Marland Kitchen Hypothyroidism   . Iron deficiency anemia   . PONV (postoperative nausea and vomiting)    used a scop patch last surg-did well  . Prediabetes   . Restless leg syndrome   . Shortness of breath dyspnea    with exertion  . Shoulder impingement    right  . SUI (stress urinary incontinence, female)   . Wears glasses    Allergies Allergies  Allergen Reactions  . Sulfa Antibiotics Hives and Itching    SURGICAL HISTORY She  has a past surgical history that includes Ankle Fusion (Right, 2011); Knee arthroscopy with medial menisectomy (Right, 05/22/2012); Anterior cervical decomp/discectomy fusion (N/A, 09/01/2013); Shoulder arthroscopy with subacromial decompression (Left, 11/10/2013); CLOSED REDUCTION AND PINNING LEFT FIFTH  PROXIMAL PHALANGEAL FX (10-09-2005); D & C HYSTEROSCOPY WITH NOVASURE ENDOMETRIAL ABLATION (07-16-2006); Shoulder arthroscopy w/ subacromial decompression and distal clavicle excision (Left, 11-27-2011); Cataract extraction w/ intraocular lens implant (Bilateral); Tonsillectomy (age 15); Laparoscopic cholecystectomy (2005); Tubal ligation (1980); Transurethral resection of bladder tumor (N/A, 01/11/2014); Cystoscopy w/ retrogrades (Left, 01/11/2014); Cystoscopy with stent placement (Left, 01/11/2014); Laparoscopic assisted vaginal  hysterectomy (N/A, 08/29/2014); Salpingoophorectomy (Bilateral, 08/29/2014); Pubovaginal sling (N/A, 08/29/2014); laparotomy (N/A, 08/29/2014); Shoulder arthroscopy with subacromial decompression (Right, 12/21/2014); Shoulder arthroscopy with distal clavicle resection (Right, 12/21/2014); Shoulder arthroscopy with bicepstenotomy (Right, 12/21/2014); Abdominal hysterectomy; Posterior cervical fusion/foraminotomy (N/A, 01/25/2015); Shoulder arthroscopy with open rotator cuff repair (Right, 07/12/2015); and Anterior cervical decomp/discectomy fusion (N/A, 08/02/2015). FAMILY HISTORY Her family history includes Breast cancer in her cousin, cousin, maternal aunt, maternal uncle, mother, paternal aunt, paternal aunt, paternal aunt, and paternal aunt; Cancer in her brother, brother, father, and paternal grandfather; Heart disease in her father; Heart failure in her paternal grandfather; Hypertension in her father; Ovarian cancer in her maternal aunt; Ovarian cancer (age of onset: 41) in her sister; Ovarian cancer (age of onset: 14) in her mother; Ovarian cancer (age of onset: 14) in her sister; Ovarian cancer (age of onset: 25) in her sister; Stroke in her father; Thyroid cancer in her mother. SOCIAL HISTORY She  reports that she quit smoking about 3  years ago. Her smoking use included Cigarettes. She has a 30.00 pack-year smoking history. She has never used smokeless tobacco. She reports that she does not drink alcohol or use drugs.   Review of Systems  Constitutional: Negative.   HENT: Negative for congestion, ear discharge, ear pain, hearing loss, nosebleeds, sore throat and tinnitus.   Eyes: Negative.   Respiratory: Negative for cough, hemoptysis, sputum production, shortness of breath, wheezing and stridor.   Cardiovascular: Negative for chest pain, palpitations, orthopnea, claudication, leg swelling and PND.  Gastrointestinal: Negative for abdominal pain, blood in stool, constipation, diarrhea, heartburn, melena,  nausea and vomiting.  Genitourinary: Negative.   Musculoskeletal: Positive for back pain, joint pain, myalgias and neck pain. Negative for falls.  Skin: Negative.   Neurological: Negative.  Negative for headaches.  Endo/Heme/Allergies: Negative.   Psychiatric/Behavioral: Positive for depression and memory loss. Negative for hallucinations, substance abuse and suicidal ideas. The patient is nervous/anxious. The patient does not have insomnia.    Physical Exam: Estimated body mass index is 35.29 kg/m as calculated from the following:   Height as of 07/11/16: 5\' 4"  (1.626 m).   Weight as of 07/11/16: 205 lb 9.6 oz (93.3 kg). There were no vitals taken for this visit. General Appearance: Well nourished, in no apparent distress. Eyes: PERRLA, EOMs, conjunctiva no swelling or erythema, normal fundi and vessels. Sinuses: No Frontal/maxillary tenderness ENT/Mouth: Ext aud canals clear, normal light reflex with TMs without erythema, bulging.  Good dentition. No erythema, swelling, or exudate on post pharynx. Tonsils not swollen or erythematous. Hearing normal.  Neck: Supple, thyroid normal. No bruits Respiratory: Respiratory effort normal, BS equal bilaterally, course expiratory breath sounds without rales, rhonchi, wheezing or stridor. RA O2 98% Cardio: RRR without murmurs, rubs or gallops. Brisk peripheral pulses.  Chest: symmetric, with normal excursions and percussion. Breasts: defer Abdomen: Soft, obese, +BS. Non tender, no guarding, rebound, hernias, masses, or organomegaly. .  Lymphatics: Non tender without lymphadenopathy.  Genitourinary: defer Musculoskeletal: Full ROM all peripheral extremities 4/5 strength, and antalgic gait with neck in brace and right arm in sling, good distal neurovascular in arm/legs.  Left ankle with mild swelling, sensation intact.  Skin: Warm, dry without rashes, lesions, ecchymosis.  Neuro: Cranial nerves intact, reflexes equal bilaterally. Normal muscle tone, no  cerebellar symptoms.  Psych: Awake and oriented X 3, normal affect, Insight and Judgment appropriate.   EKG: defer, had EKG prior to surgery 04/272017, EKG reviewed   Vicie Mutters 7:41 AM

## 2016-08-14 ENCOUNTER — Encounter: Payer: Self-pay | Admitting: Physician Assistant

## 2016-08-27 ENCOUNTER — Other Ambulatory Visit: Payer: Self-pay | Admitting: Physician Assistant

## 2016-08-27 DIAGNOSIS — F329 Major depressive disorder, single episode, unspecified: Secondary | ICD-10-CM

## 2016-08-27 DIAGNOSIS — F32A Depression, unspecified: Secondary | ICD-10-CM

## 2016-08-27 DIAGNOSIS — M797 Fibromyalgia: Secondary | ICD-10-CM

## 2016-08-27 MED ORDER — AMPHETAMINE-DEXTROAMPHETAMINE 20 MG PO TABS: 20.0000 mg | ORAL_TABLET | Freq: Every day | ORAL | 0 refills | Status: DC

## 2016-08-27 MED ORDER — CLONAZEPAM 2 MG PO TABS: 2.0000 mg | ORAL_TABLET | Freq: Three times a day (TID) | ORAL | 0 refills | Status: DC | PRN

## 2016-09-16 ENCOUNTER — Other Ambulatory Visit: Payer: Self-pay | Admitting: Orthopedic Surgery

## 2016-09-16 DIAGNOSIS — M5412 Radiculopathy, cervical region: Secondary | ICD-10-CM

## 2016-09-30 ENCOUNTER — Ambulatory Visit
Admission: RE | Admit: 2016-09-30 | Discharge: 2016-09-30 | Disposition: A | Discharge status: Home / Self Care | Payer: 59 | Source: Ambulatory Visit | Attending: Orthopedic Surgery | Admitting: Orthopedic Surgery

## 2016-09-30 DIAGNOSIS — M5412 Radiculopathy, cervical region: Secondary | ICD-10-CM

## 2016-11-09 ENCOUNTER — Other Ambulatory Visit: Payer: Self-pay | Admitting: Physician Assistant

## 2016-11-09 DIAGNOSIS — F32A Depression, unspecified: Secondary | ICD-10-CM

## 2016-11-09 DIAGNOSIS — M797 Fibromyalgia: Secondary | ICD-10-CM

## 2016-11-09 DIAGNOSIS — F329 Major depressive disorder, single episode, unspecified: Secondary | ICD-10-CM

## 2016-11-11 NOTE — Telephone Encounter (Signed)
Klonopin called into pharmacy on Aug 6th 2018 at 8:46am by DD

## 2017-01-09 NOTE — Progress Notes (Deleted)
Assessment and Plan:   Hypertension -Continue medication, monitor blood pressure at home. Continue DASH diet.  Reminder to go to the ER if any CP, SOB, nausea, dizziness, severe HA, changes vision/speech, left arm numbness and tingling and jaw pain.  Cholesterol -Continue diet and exercise. Check cholesterol.    Prediabetes  -Continue diet and exercise. Check A1C  Vitamin D Def - check level and continue medications.   Morbid Obesity with co morbidities - long discussion about weight loss, diet, and exercise  Depression, unspecified depression type -     amphetamine-dextroamphetamine (ADDERALL) 20 MG tablet; Take 1 tablet (20 mg total) by mouth daily. -     clonazePAM (KLONOPIN) 2 MG tablet; Take 1 tablet (2 mg total) by mouth 3 (three) times daily as needed for anxiety.  Fibromyalgia -     amphetamine-dextroamphetamine (ADDERALL) 20 MG tablet; Take 1 tablet (20 mg total) by mouth daily. -     clonazePAM (KLONOPIN) 2 MG tablet; Take 1 tablet (2 mg total) by mouth 3 (three) times daily as needed for anxiety.  Medication management -     Magnesium  Continue diet and meds as discussed. Further disposition pending results of labs. Over 30 minutes of exam, counseling, chart review, and critical decision making was performed  Future Appointments Date Time Provider [place]  01/13/2017 8:45 AM Vicie Mutters, PA-C [id number] None     HPI 56 y.o. female  presents for 3 month follow up on hypertension, cholesterol, prediabetes, and vitamin D deficiency.   She has a complicated history of OCD, depression, FM with chronic pain from cervical and lumbar DDD. She has been tried on several medications and has been told she needs to follow up with psych for further evaluation/help, she saw PA and counselor she went for 3 months about 2 x a week, and she was tried on several medications that did not help. She was tried on latuda that she did not feel well with. She has stopped going  due to cost. She stopped all of her medications and then went into a deep depression, she restarted her klonopin and adderall 1/2 in the AM and 1/2 in the PM. She is only on vitamin D, klonopin, lasix, gabapentin, mag, protonix, and adderall. She is off the lexapro, wellbutrin, and thyroid medication for 6 months or longer. She states she feels much better with just these medications.   Her blood pressure has been controlled at home, today their BP is    She does not workout. She denies chest pain, shortness of breath, dizziness.  She is not on cholesterol medication and denies myalgias. Her cholesterol is not at goal. The cholesterol last visit was:   Lab Results  Component Value Date   CHOL 193 03/12/2016   HDL 61 03/12/2016   LDLCALC 114 (H) 03/12/2016   TRIG 91 03/12/2016   CHOLHDL 3.2 03/12/2016    She has been working on diet and exercise for prediabetes, and denies polydipsia, polyuria and visual disturbances. Last A1C in the office was:  Lab Results  Component Value Date   HGBA1C 5.4 03/12/2016   Patient is on Vitamin D supplement.   Lab Results  Component Value Date   VD25OH 29 08/14/2015    She was on thyroid medication, she has been off x 6 months Lab Results  Component Value Date   TSH 3.43 03/12/2016  .  BMI is There is no height or weight on file to calculate BMI., she is working on diet  and exercise. Wt Readings from Last 3 Encounters:  07/11/16 205 lb 9.6 oz (93.3 kg)  07/01/16 197 lb 5 oz (89.5 kg)  05/23/16 199 lb (90.3 kg)   Current Medications:  Current Outpatient Prescriptions on File Prior to Visit  Medication Sig Dispense Refill  . albuterol (VENTOLIN HFA) 108 (90 Base) MCG/ACT inhaler Inhale 2 puffs into the lungs every 4 (four) hours as needed for wheezing or shortness of breath. 1 Inhaler 3  . amphetamine-dextroamphetamine (ADDERALL) 20 MG tablet Take 1 tablet (20 mg total) by mouth daily. 30 tablet 0  . aspirin 81 MG tablet Take 81 mg by mouth daily.     . cholecalciferol (VITAMIN D) 1000 UNITS tablet Take 7,000 Units by mouth at bedtime.     . clonazePAM (KLONOPIN) 2 MG tablet TAKE 1 TABLET BY MOUTH THREE TIMES DAILY AS NEEDED FOR ANXIETY 60 tablet 0  . DULoxetine (CYMBALTA) 30 MG capsule Take 1 capsule (30 mg total) by mouth daily. 30 capsule 3  . gabapentin (NEURONTIN) 100 MG capsule Take 100 mg by mouth 3 (three) times daily as needed. Fibromyalgia pain    . ketorolac (TORADOL) 10 MG tablet Take 1 tablet (10 mg total) by mouth every 8 (eight) hours. 90 tablet 1  . MAGNESIUM PO Take 450 mg by mouth daily.    . traMADol (ULTRAM) 50 MG tablet Take 50 mg by mouth every 8 (eight) hours as needed for moderate pain or severe pain.      No current facility-administered medications on file prior to visit.    Medical History:  Past Medical History:  Diagnosis Date  . ADD (attention deficit disorder)   . Anxiety   . Bladder neoplasm   . Cancer [facility]) 2015   Bladder cancer  . COPD (chronic obstructive pulmonary disease) ([place])   . Depression   . Fibroids   . Fibromyalgia   . H/O bronchitis    treated within the last week w steroids, antibx tessalon pearls.   . H/O sebaceous cyst 07/18/14   left breast   . Hemorrhoid   . History of melanoma excision    leg  . Hyperlipidemia   . Hypertension    normal now, only took medications for 1 month- thinks it was from pain.  Marland Kitchen Hypothyroidism   . Iron deficiency anemia   . PONV (postoperative nausea and vomiting)    used a scop patch last surg-did well  . Prediabetes   . Restless leg syndrome   . Shortness of breath dyspnea    with exertion  . Shoulder impingement    right  . SUI (stress urinary incontinence, female)   . Wears glasses    Allergies:  Allergies  Allergen Reactions  . Sulfa Antibiotics Hives and Itching     Review of Systems:  Review of Systems  Constitutional: Negative.   HENT: Negative for congestion, ear discharge, ear pain, hearing loss, nosebleeds, sore throat and  tinnitus.   Eyes: Negative.   Respiratory: Negative for cough, hemoptysis, sputum production, shortness of breath, wheezing and stridor.   Cardiovascular: Negative for chest pain, palpitations, orthopnea, claudication, leg swelling and PND.  Gastrointestinal: Negative for abdominal pain, blood in stool, constipation, diarrhea, heartburn, melena, nausea and vomiting.  Genitourinary: Negative.   Musculoskeletal: Positive for back pain, joint pain, myalgias and neck pain. Negative for falls.  Skin: Negative.   Neurological: Negative.  Negative for headaches.  Endo/Heme/Allergies: Negative.   Psychiatric/Behavioral: Positive for depression and memory loss. Negative for hallucinations,  substance abuse and suicidal ideas. The patient is nervous/anxious. The patient does not have insomnia.     Family history- Review and unchanged Social history- Review and unchanged Physical Exam: There were no vitals taken for this visit. Wt Readings from Last 3 Encounters:  07/11/16 205 lb 9.6 oz (93.3 kg)  07/01/16 197 lb 5 oz (89.5 kg)  05/23/16 199 lb (90.3 kg)   General Appearance: Well nourished, in no apparent distress. Eyes: PERRLA, EOMs, conjunctiva no swelling or erythema Sinuses: No Frontal/maxillary tenderness ENT/Mouth: Ext aud canals clear, TMs without erythema, bulging. No erythema, swelling, or exudate on post pharynx.  Tonsils not swollen or erythematous. Hearing normal.  Neck: Supple, thyroid normal.  Respiratory: Respiratory effort normal, BS equal bilaterally without rales, rhonchi, wheezing or stridor.  Cardio: RRR with no MRGs. Brisk peripheral pulses without edema.  Abdomen: Soft, + BS,  Non tender, no guarding, rebound, hernias, masses. Lymphatics: Non tender without lymphadenopathy.  Musculoskeletal: Full ROM, 5/5 strength, Antalgic gait Skin: Warm, dry without rashes, lesions, ecchymosis.  Neuro: Cranial nerves intact. Normal muscle tone, no cerebellar symptoms. Psych: Awake and  oriented X 3, normal affect, Insight and Judgment appropriate.    Vicie Mutters, PA-C 7:45 AM [facility]

## 2017-01-10 ENCOUNTER — Encounter: Payer: Self-pay | Admitting: *Deleted

## 2017-01-12 NOTE — Progress Notes (Addendum)
hasFOLLOW UP  Assessment and Plan:    Labile hypertension  Recently stable without medication; continue to monitor.    Monitor blood pressure at home.  Continue DASH diet.    Reminder to go to the ER if any CP, SOB, nausea, dizziness, severe HA, changes vision/speech, left arm numbness and tingling and jaw pain.  Borderline hyperlipidemia   Currently well managed by lifestyle  Continue diet and exercise.   Check lipid panel.   Prediabetes  Well managed by lifestyle with most recent A1C back in normal range  Continue diet and exercise.   Check A1C  Vitamin D Def/ osteoporosis prevention  Continue medications: cholecalciferol 1000 units daily  Check Vit D level  Obesity with co morbidities  Long discussion about weight loss, diet, and exercise  Discussed goal weight for height  Hypothyroidism  Previously on medication- at last visit was off medication and resulted a normal TSH   check TSH level  Bipolar Depression 2 with Major Depression  Patient appears "stable" with current medication regimen, however discussed this is not a realistic long-term plan for her complex conditions, and as she has failed several trials of medications proposed by this office, and is beyond the scope of practice for primary care- needs to be managed by psychiatry.   Stress management techniques discussed, increase water, good sleep hygiene discussed, increase exercise, and increase veggies.   Follow up with psychiatry for medication management  Fibromyalgia  She has discontinued cymbalta and gabapentin due to "sedative" side effects- currently managing fibro fog symptoms with Adderall  Discussed lifestyle, stress management, sleeping habits  Long-term use of High Risk Medication  Long discussion regarding current medications and that we cannot prescribe klonopin and opioid-type medications concurrently due to risks  Discussed concurrent use of Adderall and  klonopin  Toradol discontinued; alternatively meloxicam prescription provided  Right hip pain  Status post R hip bursectomy by Dr. Berenice Primas 10/2016 with ongoing pain of right hip not attributable to musculoskeletal/nervous origin (negative lumbar MRI, ortho workup)  Mild edema of right hip soft tissue with palpable varicose veins to area of concern, with somewhat decreased pedal/posterior tibial pulses noted on exam- may benefit from vascular evaluation. Will provide referral.    Continue diet and meds as discussed. Further disposition pending results of labs. Discussed med's effects and SE's.  Over 30 minutes of exam, counseling, chart review, and critical decision making was performed.   Future Appointments Date Time Provider [place]  04/17/2017 8:45 AM Liane Comber, NP [facility] None    ----------------------------------------------------------------------------------------------------------------------  HPI 56 y.o. female  presents for follow up on labile hypertension, borderline cholesterol, prediabetes, vitamin D deficiency, hypothyroidism, obesity. She was last seen for management of chronic medical conditions over 1 year ago. Review of medications indicates concurrent use of several high-risk medications. The patient has a complicated history of OCD, depression, FM with chronic pain from cervical and lumbar DDD. She has been tried on several medications and has been told she needs to follow up with psych for further evaluation/help, she saw PA and counselor she went for 3 months about 2 x a week, and she was tried on several medications that did not help. She was tried on latuda that she did not feel well with. She stopped going due to cost. She stopped all of her medications and then went into a deep depression, she restarted on just klonopin and adderall and was reportedly feeling much better. She was restarted on cymbalta and neurontin, but reports these  caused significant  sedation and she stopped taking. Currently back to taking adderall 20 mg 1/2 tab with breakfast and 1/2 tab with lunch, then klonopin 2 mg at HS. She reports she has not had mood swings "manic episodes" or crippling depression as she has had, and that the adderall helps her get up and accomplish ADLs.   She requests a refill for adderall, klonopin and tramadol today. Discussed we did not prescribe the tramadol, and discussed risks of taking opioids with benzodiazepines, as well as the risks associated with current medication regimen. Patient discusses she did not find previous counseling encounters with several providers beneficial; discussed she needs to have the medication management of bipolar depression and fibromyalgia (she also reports history of OCD not noted in chart) evaluated in light of repeated treatment failure.  Right hip bursectomy 10/2016 by Dr. Berenice Primas- reports soft tissue pain below/distal to incision site that has continued since prior to the operation. She has discussed this pain with orthopedics who suspected lumbar origination and have ordered MRIs. Patient feels strongly that the pain is soft tissue in origin and is requesting a local [place]. Cervical/back pain managed by Dr. Lynann Bologna.   Today her BP is BP: 124/78   She does workout (just restarted post surgery). She denies chest pain, shortness of breath, dizziness.   She is not on cholesterol medication and denies myalgias. Her cholesterol is not at goal. The cholesterol last visit was:   Lab Results  Component Value Date   CHOL 193 03/12/2016   HDL 61 03/12/2016   LDLCALC 114 (H) 03/12/2016   TRIG 91 03/12/2016   CHOLHDL 3.2 03/12/2016    She been working on diet and exercise for prediabetes, and denies hyperglycemia, hypoglycemia , nausea, paresthesia of the feet and vomiting. Last A1C in the office was:  Lab Results  Component Value Date   HGBA1C 5.4 03/12/2016   Patient is on Vitamin D supplement and at goal:   Lab  Results  Component Value Date   VD25OH 77 08/14/2015     She was previously on thyroid medication but was found to be WNL at last visit despite not taking medication: Lab Results  Component Value Date   TSH 3.43 03/12/2016  .      Current Medications:  Outpatient Encounter Prescriptions as of 01/13/2017  Medication Sig Note  . amphetamine-dextroamphetamine (ADDERALL) 20 MG tablet Take 1 tablet (20 mg total) by mouth daily.   Marland Kitchen aspirin 81 MG tablet Take 81 mg by mouth daily.   . cholecalciferol (VITAMIN D) 1000 UNITS tablet Take 7,000 Units by mouth at bedtime.  07/01/2016: Patient takes a 5000 unit + 2000 unit capsule  . clonazePAM (KLONOPIN) 2 MG tablet Take 1 tablet (2 mg total) by mouth at bedtime. for anxiety   . MAGNESIUM PO Take 450 mg by mouth daily.   . traMADol (ULTRAM) 50 MG tablet Take 50 mg by mouth every 8 (eight) hours as needed for moderate pain or severe pain.    . [DISCONTINUED] albuterol (VENTOLIN HFA) 108 (90 Base) MCG/ACT inhaler Inhale 2 puffs into the lungs every 4 (four) hours as needed for wheezing or shortness of breath.   . [DISCONTINUED] amphetamine-dextroamphetamine (ADDERALL) 20 MG tablet Take 1 tablet (20 mg total) by mouth daily.   . [DISCONTINUED] clonazePAM (KLONOPIN) 2 MG tablet TAKE 1 TABLET BY MOUTH THREE TIMES DAILY AS NEEDED FOR ANXIETY   . [DISCONTINUED] DULoxetine (CYMBALTA) 30 MG capsule Take 1 capsule (30 mg total) by mouth  daily.   . [DISCONTINUED] gabapentin (NEURONTIN) 100 MG capsule Take 100 mg by mouth 3 (three) times daily as needed. Fibromyalgia pain   . [DISCONTINUED] ketorolac (TORADOL) 10 MG tablet Take 1 tablet (10 mg total) by mouth every 8 (eight) hours.   . meloxicam (MOBIC) 15 MG tablet Take 1 tablet (15 mg total) by mouth daily.    No facility-administered encounter medications on file as of 01/13/2017.      Allergies:  Allergies  Allergen Reactions  . Sulfa Antibiotics Hives and Itching     Medical History:  Past Medical  History:  Diagnosis Date  . ADD (attention deficit disorder)   . Anxiety   . Bladder neoplasm   . Cancer [facility]) 2015   Bladder cancer  . COPD (chronic obstructive pulmonary disease) ([facility])   . Depression   . Fibroids   . Fibromyalgia   . H/O bronchitis    treated within the last week w steroids, antibx tessalon pearls.   . H/O sebaceous cyst 07/18/14   left breast   . Hemorrhoid   . History of melanoma excision    leg  . Hyperlipidemia   . Hypertension    normal now, only took medications for 1 month- thinks it was from pain.  Marland Kitchen Hypothyroidism   . Iron deficiency anemia   . PONV (postoperative nausea and vomiting)    used a scop patch last surg-did well  . Prediabetes   . Restless leg syndrome   . Shortness of breath dyspnea    with exertion  . Shoulder impingement    right  . SUI (stress urinary incontinence, female)   . Wears glasses    Family history- Reviewed and unchanged Social history- Reviewed and unchanged   Review of Systems:  Review of Systems  Constitutional: Negative.  Negative for chills, fever and malaise/fatigue.  HENT: Negative.  Negative for congestion, hearing loss and sore throat.   Eyes: Negative for blurred vision, double vision and pain.  Respiratory: Negative for cough, sputum production, shortness of breath and wheezing.   Cardiovascular: Negative for chest pain, palpitations, orthopnea and leg swelling.  Gastrointestinal: Negative for abdominal pain, blood in stool, constipation, melena, nausea and vomiting.  Genitourinary: Negative.   Musculoskeletal: Negative for myalgias.  Skin: Negative.   Neurological: Negative for dizziness, sensory change, weakness and headaches.  Endo/Heme/Allergies: Negative.   Psychiatric/Behavioral: Negative for suicidal ideas. The patient has insomnia (without use of klonopin).   All other systems reviewed and are negative.     Physical Exam: BP 124/78   Pulse 64   Temp (!) 97.3 F (36.3 C)   Resp 18    Ht 5\' 4"  (1.626 m)   Wt 202 lb 9.6 oz (91.9 kg)   BMI 34.78 kg/m   Wt Readings from Last 3 Encounters:  01/13/17 202 lb 9.6 oz (91.9 kg)  07/11/16 205 lb 9.6 oz (93.3 kg)  07/01/16 197 lb 5 oz (89.5 kg)   General Appearance: Well nourished, overweight female in no apparent distress. Eyes: PERRLA, EOMs, conjunctiva no swelling or erythema Sinuses: No Frontal/maxillary tenderness ENT/Mouth: Ext aud canals clear, TMs without erythema, bulging. No erythema, swelling, or exudate on post pharynx.  Tonsils not swollen or erythematous. Hearing normal.  Neck: Supple, thyroid normal.  Respiratory: Respiratory effort normal, BS equal bilaterally without rales, rhonchi, wheezing or stridor.  Cardiovascular: RRR with no MRGs. Peripheral pulses brisk on LLE without edema, RLE pulses 1+, some mild nonpitting edema to ankle. Superficial vericose veins noted  to right upper thigh/hip near area of concerns with pain. Abdomen: Soft, + BS.  Non tender, no guarding, rebound, hernias, masses. Lymphatics: Non tender without lymphadenopathy.  Musculoskeletal: Full ROM, 5/5 strength, Normal gait.  Skin: Warm, dry without rashes, lesions, ecchymosis. Postoperative wound to R hip healing well without erythema/injection.  Neuro: Cranial nerves intact. No cerebellar symptoms.  Psych: Awake and oriented X 3, normal affect and speech, Insight and Judgment appropriate. Thought pattern somewhat cyclical.    Izora Ribas, NP 1:18 PM [facility]

## 2017-01-13 ENCOUNTER — Encounter: Payer: Self-pay | Admitting: Adult Health

## 2017-01-13 ENCOUNTER — Ambulatory Visit: Payer: Self-pay | Admitting: Physician Assistant

## 2017-01-13 ENCOUNTER — Other Ambulatory Visit: Payer: Self-pay | Admitting: *Deleted

## 2017-01-13 ENCOUNTER — Ambulatory Visit (INDEPENDENT_AMBULATORY_CARE_PROVIDER_SITE_OTHER): Payer: BLUE CROSS/BLUE SHIELD | Admitting: Adult Health

## 2017-01-13 VITALS — BP 124/78 | HR 64 | Temp 97.3°F | Resp 18 | Ht 64.0 in | Wt 202.6 lb

## 2017-01-13 DIAGNOSIS — R7303 Prediabetes: Secondary | ICD-10-CM | POA: Diagnosis not present

## 2017-01-13 DIAGNOSIS — M25551 Pain in right hip: Secondary | ICD-10-CM

## 2017-01-13 DIAGNOSIS — M797 Fibromyalgia: Secondary | ICD-10-CM

## 2017-01-13 DIAGNOSIS — R0989 Other specified symptoms and signs involving the circulatory and respiratory systems: Secondary | ICD-10-CM

## 2017-01-13 DIAGNOSIS — Z79899 Other long term (current) drug therapy: Secondary | ICD-10-CM | POA: Diagnosis not present

## 2017-01-13 DIAGNOSIS — Z6835 Body mass index (BMI) 35.0-35.9, adult: Secondary | ICD-10-CM

## 2017-01-13 DIAGNOSIS — F329 Major depressive disorder, single episode, unspecified: Secondary | ICD-10-CM

## 2017-01-13 DIAGNOSIS — F3181 Bipolar II disorder: Secondary | ICD-10-CM

## 2017-01-13 DIAGNOSIS — E785 Hyperlipidemia, unspecified: Secondary | ICD-10-CM | POA: Diagnosis not present

## 2017-01-13 DIAGNOSIS — E559 Vitamin D deficiency, unspecified: Secondary | ICD-10-CM | POA: Diagnosis not present

## 2017-01-13 DIAGNOSIS — Z23 Encounter for immunization: Secondary | ICD-10-CM

## 2017-01-13 DIAGNOSIS — E039 Hypothyroidism, unspecified: Secondary | ICD-10-CM

## 2017-01-13 DIAGNOSIS — F32A Depression, unspecified: Secondary | ICD-10-CM

## 2017-01-13 MED ORDER — AMPHETAMINE-DEXTROAMPHETAMINE 20 MG PO TABS: 20.0000 mg | ORAL_TABLET | Freq: Every day | ORAL | 0 refills | Status: DC

## 2017-01-13 MED ORDER — GABAPENTIN 100 MG PO CAPS: 100.0000 mg | ORAL_CAPSULE | Freq: Three times a day (TID) | ORAL | 1 refills | Status: AC | PRN

## 2017-01-13 MED ORDER — CLONAZEPAM 2 MG PO TABS: 2.0000 mg | ORAL_TABLET | Freq: Every day | ORAL | 0 refills | Status: AC

## 2017-01-13 MED ORDER — MELOXICAM 15 MG PO TABS: 15.0000 mg | ORAL_TABLET | Freq: Every day | ORAL | 1 refills | Status: AC

## 2017-01-13 MED ORDER — DULOXETINE HCL 30 MG PO CPEP: 30.0000 mg | ORAL_CAPSULE | Freq: Every day | ORAL | 3 refills | Status: DC

## 2017-01-13 MED ORDER — ALBUTEROL SULFATE HFA 108 (90 BASE) MCG/ACT IN AERS: 2.0000 | INHALATION_SPRAY | RESPIRATORY_TRACT | 3 refills | Status: DC | PRN

## 2017-01-13 NOTE — Patient Instructions (Signed)
Counseling services  Here are some numbers below you can try but I suggest calling your insurance and finding out who is in your network and THEN calling those people or looking them up on google.   I'm a big fan of Cognitive Behavioral Therapy, look this up on You tube or check with the therapist you see if they are certified.  This form of therapy helps to teach you skills to better handle with current situation that are causing anxiety or depression.   [place].  Address: [address] [place], [place] [address] [place], [place] [address] [address]  [phone] Fax [phone]  [facility] Hours: Monday-Thursday 830-8pm  Friday 830AM-7PM Address: [place] [phone]) [place] [facility] [phone] office [email or web address] [address], [address] [place], [place], [place] [address]  Dr. Arbutus Leas, Ph.D. [address] [place] [address] Phone: [phone]              Psychological Services:  [place], [place] [address], [place], [place]: [phone] or [phone], [email or web address]  Mobile Crisis Teams:  [facility]  Mobile Crisis Care Unit  [phone]  Assertive  Psychotherapeutic Services  [place] [address]  [place]  [address]  [place]  [phone]  Self-Help/Support Groups:  [place]. of [employer] of support groups  [phone] (call for more info)

## 2017-01-14 LAB — CBC WITH DIFFERENTIAL/PLATELET
Basophils Absolute: 34 cells/uL (ref 0–200)
Basophils Relative: 0.4 %
Eosinophils Absolute: 111 cells/uL (ref 15–500)
Eosinophils Relative: 1.3 %
HCT: 41.2 % (ref 35.0–45.0)
Hemoglobin: 13.9 g/dL (ref 11.7–15.5)
Lymphs Abs: 2941 cells/uL (ref 850–3900)
MCH: 29.4 pg (ref 27.0–33.0)
MCHC: 33.7 g/dL (ref 32.0–36.0)
MCV: 87.1 fL (ref 80.0–100.0)
MPV: 10.6 fL (ref 7.5–12.5)
Monocytes Relative: 6.6 %
Neutro Abs: 4854 cells/uL (ref 1500–7800)
Neutrophils Relative %: 57.1 %
Platelets: 271 10*3/uL (ref 140–400)
RBC: 4.73 10*6/uL (ref 3.80–5.10)
RDW: 13.1 % (ref 11.0–15.0)
Total Lymphocyte: 34.6 %
WBC mixed population: 561 cells/uL (ref 200–950)
WBC: 8.5 10*3/uL (ref 3.8–10.8)

## 2017-01-14 LAB — HEPATIC FUNCTION PANEL
AG Ratio: 1.8 (calc) (ref 1.0–2.5)
ALT: 18 U/L (ref 6–29)
AST: 18 U/L (ref 10–35)
Albumin: 4.2 g/dL (ref 3.6–5.1)
Alkaline phosphatase (APISO): 91 U/L (ref 33–130)
Bilirubin, Direct: 0.1 mg/dL (ref 0.0–0.2)
Globulin: 2.3 g/dL (calc) (ref 1.9–3.7)
Indirect Bilirubin: 0.3 mg/dL (calc) (ref 0.2–1.2)
Total Bilirubin: 0.4 mg/dL (ref 0.2–1.2)
Total Protein: 6.5 g/dL (ref 6.1–8.1)

## 2017-01-14 LAB — BASIC METABOLIC PANEL WITH GFR
BUN: 20 mg/dL (ref 7–25)
CO2: 28 mmol/L (ref 20–32)
Calcium: 9.4 mg/dL (ref 8.6–10.4)
Chloride: 106 mmol/L (ref 98–110)
Creat: 0.66 mg/dL (ref 0.50–1.05)
GFR, Est African American: 114 mL/min/{1.73_m2} (ref >=60)
GFR, Est Non African American: 98 mL/min/{1.73_m2} (ref >=60)
Glucose, Bld: 99 mg/dL (ref 65–99)
Potassium: 4.7 mmol/L (ref 3.5–5.3)
Sodium: 141 mmol/L (ref 135–146)

## 2017-01-14 LAB — LIPID PANEL
Cholesterol: 200 mg/dL — ABNORMAL HIGH (ref <200)
HDL: 51 mg/dL (ref >50)
LDL Cholesterol (Calc): 127 mg/dL (calc) — ABNORMAL HIGH
Non-HDL Cholesterol (Calc): 149 mg/dL (calc) — ABNORMAL HIGH (ref <130)
Total CHOL/HDL Ratio: 3.9 (calc) (ref <5.0)
Triglycerides: 116 mg/dL (ref <150)

## 2017-01-14 LAB — TSH: TSH: 3.55 mIU/L (ref 0.40–4.50)

## 2017-01-14 LAB — VITAMIN D 25 HYDROXY (VIT D DEFICIENCY, FRACTURES): Vit D, 25-Hydroxy: 70 ng/mL (ref 30–100)

## 2017-01-15 ENCOUNTER — Telehealth: Payer: Self-pay | Admitting: Internal Medicine

## 2017-01-15 NOTE — Telephone Encounter (Signed)
Called patient per Caryl Pina Corbett's recommendation,  to advise that the Patient Requested  Ultrasound of right thigh was declined by ,insurance. Recommends she return to [place] for for further evaluation of pain in right thigh. New Information: Patient advised that after her last consult with Cassie Freer, she had a consultation with  [facility] for 2nd opinion. S&S prescribed Gabapentin, with no relief. Patient states  In June 2018 [facility]  was supposed to order an MRI of her right hip, thigh and knee, but when they scheduled her it was an MRI Cervical Spine w/o cm. They advised further studies would be considered determinate to MRI Cer/Spine.  She has not pursued any further studies since. Patient states she did not disclose to Liane Comber  this issue or give her current and ongoing symptoms. Patient states that Right leg has continuous pain at and above thigh "saddle bag" area of right leg. States if she stands for more than 5 minutes or puts weight on the right leg it goes numb, tingles, then stabbing pain and leg gives out. This has caused several falls for the patient. She states she sometimes doesn't realize that the right  leg has gone numb and she is unable to stand up or put weight on it.  New issue, patient states that now the pain is radiating down to the outside of the right knee, since 2 months.  Patient agrees to follow up with Dr Lynann Bologna or Dr Berenice Primas for further evaluation.

## 2017-01-15 NOTE — Addendum Note (Signed)
Addended by: Izora Ribas on: 01/15/2017 08:46 AM   Modules accepted: Orders

## 2017-02-13 ENCOUNTER — Encounter: Payer: Self-pay | Admitting: Physician Assistant

## 2017-02-13 LAB — HM MAMMOGRAPHY

## 2017-02-14 ENCOUNTER — Other Ambulatory Visit: Payer: Self-pay | Admitting: Physician Assistant

## 2017-02-14 DIAGNOSIS — F329 Major depressive disorder, single episode, unspecified: Secondary | ICD-10-CM

## 2017-02-14 DIAGNOSIS — M797 Fibromyalgia: Secondary | ICD-10-CM

## 2017-02-14 DIAGNOSIS — F32A Depression, unspecified: Secondary | ICD-10-CM

## 2017-02-14 MED ORDER — AMPHETAMINE-DEXTROAMPHETAMINE 20 MG PO TABS: 20.0000 mg | ORAL_TABLET | Freq: Every day | ORAL | 0 refills | Status: AC

## 2017-02-17 ENCOUNTER — Telehealth: Payer: Self-pay

## 2017-02-17 NOTE — Telephone Encounter (Signed)
Pharmacy called to report Adderall was e-prescribed & filled on Nov 10th 2018 by Rodena Piety. & on that same day patient dropped off a paper script for adderall written by you to [facility].  Spoke with pt & pt states she did not get her Rx filled twice. She states that she did go get  It filled by other dr because she was told at her last visit here that she could not get it filled here anymore.  Pt wants you to know that she was not happy at her last visit because she did not get to see you & this is why she went to another doctor.

## 2017-02-19 ENCOUNTER — Ambulatory Visit (INDEPENDENT_AMBULATORY_CARE_PROVIDER_SITE_OTHER): Payer: BLUE CROSS/BLUE SHIELD | Admitting: Neurology

## 2017-02-19 ENCOUNTER — Ambulatory Visit: Payer: BLUE CROSS/BLUE SHIELD | Admitting: Neurology

## 2017-02-19 ENCOUNTER — Encounter: Payer: Self-pay | Admitting: Neurology

## 2017-02-19 DIAGNOSIS — M79604 Pain in right leg: Secondary | ICD-10-CM | POA: Diagnosis not present

## 2017-02-19 DIAGNOSIS — M5410 Radiculopathy, site unspecified: Secondary | ICD-10-CM

## 2017-02-19 NOTE — Progress Notes (Signed)
Please refer to EMG and nerve conduction study procedure note. 

## 2017-02-19 NOTE — Procedures (Signed)
     HISTORY:  [name] is a 56 year old patient with a history of numbness and discomfort in the right hip and thigh and lower leg that has been present off and on for about 1 year.  The patient has worse symptoms when up standing.  She is being evaluated for a possible neuropathy or a lumbar radiculopathy.  NERVE CONDUCTION STUDIES:  Nerve conduction studies were performed on both lower extremities. The distal motor latencies and motor amplitudes for the peroneal and posterior tibial nerves were within normal limits. The nerve conduction velocities for these nerves were also normal. The H reflex latencies were normal. The sensory latencies for the peroneal nerves were within normal limits.   EMG STUDIES:  EMG study was performed on the right lower extremity:  The tibialis anterior muscle reveals 2 to 4K motor units with full recruitment. No fibrillations or positive waves were seen. The peroneus tertius muscle reveals 2 to 4K motor units with full recruitment. No fibrillations or positive waves were seen. The medial gastrocnemius muscle reveals 1 to 3K motor units with full recruitment. No fibrillations or positive waves were seen. The vastus lateralis muscle reveals 2 to 4K motor units with full recruitment. No fibrillations or positive waves were seen. The iliopsoas muscle reveals 2 to 4K motor units with full recruitment. No fibrillations or positive waves were seen. The biceps femoris muscle (long head) reveals 2 to 4K motor units with full recruitment. No fibrillations or positive waves were seen. The lumbosacral paraspinal muscles were tested at 3 levels, and revealed no abnormalities of insertional activity at all 3 levels tested. There was good relaxation.   IMPRESSION:  Nerve conduction studies done on both lower extremities were within normal limits.  No evidence of a neuropathy is seen.  EMG evaluation of the right lower extremity is unremarkable, no evidence of a  lumbar radiculopathy is seen on this evaluation.  Jill Alexanders MD 02/19/2017 2:22 PM  [facility] [address] [place], [place] [address]  Phone [phone] Fax [phone]

## 2017-03-03 ENCOUNTER — Encounter: Payer: Self-pay | Admitting: Physician Assistant

## 2017-03-23 ENCOUNTER — Emergency Department (HOSPITAL_COMMUNITY)
Admission: EM | Admit: 2017-03-23 | Discharge: 2017-03-23 | Disposition: A | Discharge status: Home / Self Care | Payer: BLUE CROSS/BLUE SHIELD | Attending: Emergency Medicine | Admitting: Emergency Medicine

## 2017-03-23 ENCOUNTER — Encounter (HOSPITAL_COMMUNITY): Payer: Self-pay | Admitting: Emergency Medicine

## 2017-03-23 ENCOUNTER — Emergency Department (HOSPITAL_COMMUNITY): Payer: BLUE CROSS/BLUE SHIELD

## 2017-03-23 DIAGNOSIS — I10 Essential (primary) hypertension: Secondary | ICD-10-CM | POA: Insufficient documentation

## 2017-03-23 DIAGNOSIS — Z7982 Long term (current) use of aspirin: Secondary | ICD-10-CM | POA: Diagnosis not present

## 2017-03-23 DIAGNOSIS — Z8551 Personal history of malignant neoplasm of bladder: Secondary | ICD-10-CM | POA: Diagnosis not present

## 2017-03-23 DIAGNOSIS — M5441 Lumbago with sciatica, right side: Secondary | ICD-10-CM | POA: Diagnosis not present

## 2017-03-23 DIAGNOSIS — Z87891 Personal history of nicotine dependence: Secondary | ICD-10-CM | POA: Diagnosis not present

## 2017-03-23 DIAGNOSIS — Z85820 Personal history of malignant melanoma of skin: Secondary | ICD-10-CM | POA: Diagnosis not present

## 2017-03-23 DIAGNOSIS — J449 Chronic obstructive pulmonary disease, unspecified: Secondary | ICD-10-CM | POA: Diagnosis not present

## 2017-03-23 DIAGNOSIS — E039 Hypothyroidism, unspecified: Secondary | ICD-10-CM | POA: Insufficient documentation

## 2017-03-23 DIAGNOSIS — M545 Low back pain: Secondary | ICD-10-CM | POA: Diagnosis present

## 2017-03-23 MED ORDER — KETOROLAC TROMETHAMINE 30 MG/ML IJ SOLN
30.0000 mg | Freq: Once | INTRAMUSCULAR | Status: AC
  Administered 2017-03-23: 30 mg via INTRAVENOUS
  Filled 2017-03-23: qty 1

## 2017-03-23 MED ORDER — MORPHINE SULFATE (PF) 4 MG/ML IV SOLN: 4.0000 mg | Freq: Once | INTRAVENOUS | Status: DC

## 2017-03-23 MED ORDER — LIDOCAINE 5 % EX PTCH: 1.0000 | MEDICATED_PATCH | CUTANEOUS | 0 refills | Status: DC

## 2017-03-23 MED ORDER — NAPROXEN 500 MG PO TABS: 500.0000 mg | ORAL_TABLET | Freq: Two times a day (BID) | ORAL | 0 refills | Status: DC

## 2017-03-23 MED ORDER — METHYLPREDNISOLONE SODIUM SUCC 125 MG IJ SOLR
80.0000 mg | Freq: Once | INTRAMUSCULAR | Status: AC
  Administered 2017-03-23: 80 mg via INTRAVENOUS
  Filled 2017-03-23: qty 2

## 2017-03-23 MED ORDER — HYDROCODONE-ACETAMINOPHEN 5-325 MG PO TABS
2.0000 | ORAL_TABLET | Freq: Once | ORAL | Status: AC
  Administered 2017-03-23: 2 via ORAL
  Filled 2017-03-23: qty 2

## 2017-03-23 MED ORDER — HYDROCODONE-ACETAMINOPHEN 5-325 MG PO TABS: 1.0000 | ORAL_TABLET | ORAL | 0 refills | Status: DC | PRN

## 2017-03-23 MED ORDER — METHOCARBAMOL 500 MG PO TABS: 500.0000 mg | ORAL_TABLET | Freq: Two times a day (BID) | ORAL | 0 refills | Status: DC

## 2017-03-23 MED ORDER — METHOCARBAMOL 500 MG PO TABS
1000.0000 mg | ORAL_TABLET | Freq: Once | ORAL | Status: AC
  Administered 2017-03-23: 1000 mg via ORAL
  Filled 2017-03-23: qty 2

## 2017-03-23 NOTE — ED Triage Notes (Signed)
Pt arrives via [facility]. Hx of spinal fusions. Over three days has had progressive lower back pain and when she moves her neck she has increased pain in her neck. IV established #20 in the left hand.

## 2017-03-23 NOTE — Discharge Instructions (Signed)
Take it easy, but do not lay around too much as this may make any stiffness worse.  Antiinflammatory medications: Take 600 mg of ibuprofen every 6 hours or 440 mg (over the counter dose) to 500 mg (prescription dose) of naproxen every 12 hours for the next 3 days. After this time, these medications may be used as needed for pain. Take these medications with food to avoid upset stomach. Choose only one of these medications, do not take them together.  Tylenol: Should you continue to have additional pain while taking the ibuprofen or naproxen, you may add in tylenol as needed. Your daily total maximum amount of tylenol from all sources should be limited to 4000mg /day for persons without liver problems, or 2000mg /day for those with liver problems. Vicodin: May take Vicodin as needed for severe pain.  Do not drive or perform other dangerous activities while taking the Vicodin.  Please note that each pill of Vicodin contains 325 mg of Tylenol and the above dosage limits apply. Muscle relaxer: Robaxin is a muscle relaxer and may help loosen stiff muscles. Do not take the Robaxin while driving or performing other dangerous activities.  Lidocaine patches: These are available via either prescription or over-the-counter. The over-the-counter option may be more economical one and are likely just as effective. There are multiple over-the-counter brands, such as Salonpas. Exercises: Be sure to perform the attached exercises starting with three times a week and working up to performing them daily. This is an essential part of preventing long term problems.   Follow up with your spinal specialist as soon as possible on this matter.

## 2017-03-23 NOTE — ED Triage Notes (Signed)
Patient denies any urinary problems or falls to cause pain.

## 2017-03-23 NOTE — ED Provider Notes (Signed)
[facility] DEPT Provider Note   CSN: [id number] Arrival date & time: 03/23/17  1734     History   Chief Complaint Chief Complaint  Patient presents with  . Back Pain  . Neck Pain    HPI [name] is a 56 y.o. female.  HPI   [name] is a 56 y.o. female, with a history of fibromyalgia, COPD, bladder cancer, and anxiety, presenting to the ED with back over the last 3 days.  States she has had similar back pain intermittently in the past, but this recent episode is worse. She began to have bilateral lower back pain while at rest.  Pain is described as a tightness, 10/10, radiating into the right buttock.   She endorses some numbness to the right lateral thigh, but this has been present for months.  States she feels as if her pain is causing weakness in the right leg. The patient also complains of right-sided neck pain, moderate, also described as a tightness, radiating into the right shoulder.  Accompanied by paresthesias in the right hand. Denies history of IV drug use or HIV.  Denies changes in bowel or bladder function, saddle anesthesias, falls/trauma, fever/chills, abdominal pain, urinary complaints, or any other complaints. Has close follow up with her spinal surgeon, Dr. Lynann Bologna, tomorrow.    Past Medical History:  Diagnosis Date  . ADD (attention deficit disorder)   . Anxiety   . Bladder neoplasm   . Cancer [facility]) 2015   Bladder cancer  . COPD (chronic obstructive pulmonary disease) ([place])   . Depression   . Fibroids   . Fibromyalgia   . H/O bronchitis    treated within the last week w steroids, antibx tessalon pearls.   . H/O sebaceous cyst 07/18/14   left breast   . Hemorrhoid   . History of melanoma excision    leg  . Hyperlipidemia   . Hypertension    normal now, only took medications for 1 month- thinks it was from pain.  Marland Kitchen Hypothyroidism   . Iron deficiency anemia   . PONV (postoperative nausea and vomiting)     used a scop patch last surg-did well  . Prediabetes   . Restless leg syndrome   . Shortness of breath dyspnea    with exertion  . Shoulder impingement    right  . SUI (stress urinary incontinence, female)   . Wears glasses     Patient Active Problem List   Diagnosis Date Noted  . Back pain 11/24/2015  . Radicular pain 08/02/2015  . Peripheral sensory neuropathy 05/10/2015  . Obesity 07/05/2014  . Vitamin D deficiency 07/05/2014  . Thoracic aorta atherosclerosis ([place]) 07/05/2014  . Bladder cancer ([place])   . Hypothyroidism   . Restless leg syndrome   . Fibromyalgia   . Labile hypertension   . Borderline hyperlipidemia   . COPD (chronic obstructive pulmonary disease) ([place])   . GERD (gastroesophageal reflux disease)   . Prediabetes   . Bipolar 2 disorder, major depressive episode [facility])     Past Surgical History:  Procedure Laterality Date  . ABDOMINAL HYSTERECTOMY    . ANKLE FUSION Right 2011  . ANTERIOR CERVICAL DECOMP/DISCECTOMY FUSION N/A 09/01/2013   Procedure: Anterior cervical decompression fusion, cervical 5-6 with instrumentation and allograft;  Surgeon: Sinclair Ship, MD;  Location: [place];  Service: Orthopedics;  Laterality: N/A;  Anterior cervical decompression fusion, cervical 5-6 with instrumentation and allograft  . ANTERIOR CERVICAL DECOMP/DISCECTOMY FUSION N/A 08/02/2015  Procedure: ANTERIOR CERVICAL DECOMPRESSION FUSION CERVICAL 6 - THORACIC 1 WITH INSTRUMENTATION AND ALLOGRAFT;  Surgeon: Phylliss Bob, MD;  Location: [place];  Service: Orthopedics;  Laterality: N/A;  ANTERIOR CERVICAL DECOMPRESSION FUSION, CERVICAL 6-7,CERVICAL 7-THORACIC 7 WITH INSTRUMENTATION AND ALLOGRAFT  . CATARACT EXTRACTION W/ INTRAOCULAR LENS IMPLANT Bilateral   . CLOSED REDUCTION AND PINNING LEFT FIFTH  PROXIMAL PHALANGEAL FX  10-09-2005  . CYSTOSCOPY W/ RETROGRADES Left 01/11/2014   Procedure: CYSTOSCOPY WITH RETROGRADE PYELOGRAM;  Surgeon: Festus Aloe, MD;  Location: [facility];  Service: Urology;  Laterality: Left;  . CYSTOSCOPY WITH STENT PLACEMENT Left 01/11/2014   Procedure: CYSTOSCOPY WITH STENT PLACEMENT;  Surgeon: Festus Aloe, MD;  Location[facility];  Service: Urology;  Laterality: Left;  . [place] ENDOMETRIAL ABLATION  07-16-2006  . KNEE ARTHROSCOPY WITH MEDIAL MENISECTOMY Right 05/22/2012   Procedure: KNEE ARTHROSCOPY WITH MEDIAL MENISECTOMY and Medial [place]a excision;  Surgeon: Alta Corning, MD;  Location: [place];  Service: Orthopedics;  Laterality: Right;  . LAPAROSCOPIC ASSISTED VAGINAL HYSTERECTOMY N/A 08/29/2014   Procedure: LAPAROSCOPIC ASSISTED VAGINAL HYSTERECTOMY;  Surgeon: Molli Posey, MD;  Location: [place];  Service: Gynecology;  Laterality: N/A;  . LAPAROSCOPIC CHOLECYSTECTOMY  2005  . LAPAROTOMY N/A 08/29/2014   Procedure: EXPLORATORY LAPAROTOMY WITH LIGATION OF BLEEDER;  Surgeon: Molli Posey, MD;  Location: [facility];  Service: Gynecology;  Laterality: N/A;  . POSTERIOR CERVICAL FUSION/FORAMINOTOMY N/A 01/25/2015   Procedure: POSTERIOR CERVICAL DECOMPRESSSION CERVICAL 7-THORACIC 1;  Surgeon: Phylliss Bob, MD;  Location: [place];  Service: Orthopedics;  Laterality: N/A;  Posterior cervical decompression, cervical 5-6, cervical 6-7, cervical 7-thoracic 1   . PUBOVAGINAL SLING N/A 08/29/2014   Procedure: Okey Dupre SINGLE INCISION SLING;  Surgeon: Molli Posey, MD;  Location: [facility];  Service: Gynecology;  Laterality: N/A;  . SALPINGOOPHORECTOMY Bilateral 08/29/2014   Procedure: SALPINGO OOPHORECTOMY;  Surgeon: Molli Posey, MD;  Location: [facility];  Service: Gynecology;  Laterality: Bilateral;  . SHOULDER ARTHROSCOPY W/ SUBACROMIAL DECOMPRESSION AND DISTAL CLAVICLE EXCISION Left 11-27-2011   W/  DEBRIDEMENT  . SHOULDER ARTHROSCOPY WITH BICEPSTENOTOMY Right 12/21/2014   Procedure: SHOULDER ARTHROSCOPY WITH  BICEPSTENOTOMY;  Surgeon: Dorna Leitz, MD;  Location: [place];  Service: Orthopedics;  Laterality: Right;  . SHOULDER ARTHROSCOPY WITH DISTAL CLAVICLE RESECTION Right 12/21/2014   Procedure: SHOULDER ARTHROSCOPY WITH DISTAL CLAVICLE RESECTION;  Surgeon: Dorna Leitz, MD;  Location: [place];  Service: Orthopedics;  Laterality: Right;  . SHOULDER ARTHROSCOPY WITH OPEN ROTATOR CUFF REPAIR Right 07/12/2015   Procedure: SHOULDER ARTHROSCOPY WITH ACROMIOPLASTY AND OPEN ROTATOR CUFF REPAIR;  Surgeon: Dorna Leitz, MD;  Location: [place];  Service: Orthopedics;  Laterality: Right;  . SHOULDER ARTHROSCOPY WITH SUBACROMIAL DECOMPRESSION Left 11/10/2013   Procedure: SHOULDER ARTHROSCOPY WITH SUBACROMIAL DECOMPRESSION WITH ROTATOR CUFF REPAIR AND BICEPS TENODESIS;  Surgeon: Alta Corning, MD;  Location: [place];  Service: Orthopedics;  Laterality: Left;  . SHOULDER ARTHROSCOPY WITH SUBACROMIAL DECOMPRESSION Right 12/21/2014   Procedure: SHOULDER ARTHROSCOPY WITH SUBACROMIAL DECOMPRESSION;  Surgeon: Dorna Leitz, MD;  Location: [place];  Service: Orthopedics;  Laterality: Right;  . TONSILLECTOMY  age 29  . TRANSURETHRAL RESECTION OF BLADDER TUMOR N/A 01/11/2014   Procedure: TRANSURETHRAL RESECTION OF BLADDER TUMOR (TURBT);  Surgeon: Festus Aloe, MD;  Location: [facility];  Service: Urology;  Laterality: N/A;  . [place]  OB History    No data available       Home Medications    Prior to Admission medications   Medication Sig Start Date End Date Taking? Authorizing Provider  albuterol (VENTOLIN HFA) 108 (90 Base) MCG/ACT inhaler Inhale 2 puffs into the lungs every 4 (four) hours as needed for wheezing or shortness of breath. 01/13/17   Unk Pinto, MD  amphetamine-dextroamphetamine (ADDERALL) 20 MG tablet Take 1 tablet (20 mg total) daily by mouth. 02/14/17   Vicie Mutters, PA-C    aspirin 81 MG tablet Take 81 mg by mouth daily.    [provider]  cholecalciferol (VITAMIN D) 1000 UNITS tablet Take 7,000 Units by mouth at bedtime.     [provider]  clonazePAM (KLONOPIN) 2 MG tablet Take 1 tablet (2 mg total) by mouth at bedtime. for anxiety 01/13/17   Liane Comber, NP  DULoxetine (CYMBALTA) 30 MG capsule Take 1 capsule (30 mg total) by mouth daily. Patient not taking: Reported on 01/13/2017 01/13/17   Unk Pinto, MD  gabapentin (NEURONTIN) 100 MG capsule Take 1 capsule (100 mg total) by mouth 3 (three) times daily as needed. Fibromyalgia pain Patient not taking: Reported on 01/13/2017 01/13/17   Unk Pinto, MD  HYDROcodone-acetaminophen (NORCO/VICODIN) 5-325 MG tablet Take 1-2 tablets by mouth every 4 (four) hours as needed for severe pain. 03/23/17   Cariah Salatino C, PA-C  lidocaine (LIDODERM) 5 % Place 1 patch onto the skin daily. Remove & Discard patch within 12 hours or as directed by MD 03/23/17   Lorayne Bender, PA-C  MAGNESIUM PO Take 450 mg by mouth daily.    [provider]  meloxicam (MOBIC) 15 MG tablet Take 1 tablet (15 mg total) by mouth daily. 01/13/17 01/13/18  Liane Comber, NP  methocarbamol (ROBAXIN) 500 MG tablet Take 1 tablet (500 mg total) by mouth 2 (two) times daily. 03/23/17   Bree Heinzelman C, PA-C  naproxen (NAPROSYN) 500 MG tablet Take 1 tablet (500 mg total) by mouth 2 (two) times daily. 03/23/17   Macon Lesesne C, PA-C  traMADol (ULTRAM) 50 MG tablet Take 50 mg by mouth every 8 (eight) hours as needed for moderate pain or severe pain.  06/25/16   [provider]    Family History Family History  Problem Relation Age of Onset  . Cancer Father        Lung and stomach   . Heart disease Father   . Hypertension Father   . Stroke Father   . Heart failure Paternal Grandfather   . Cancer Paternal Grandfather        colon cancer  . Ovarian cancer Mother 38  . Breast cancer Mother        dx in her late  77s-50s  . Thyroid cancer Mother   . Ovarian cancer Sister 86  . Cancer Brother        abdominal tumor  . Ovarian cancer Maternal Aunt   . Breast cancer Maternal Aunt   . Breast cancer Maternal Uncle   . Breast cancer Paternal Aunt   . Ovarian cancer Sister 3  . Ovarian cancer Sister 59  . Cancer Brother        abdominal tumor  . Breast cancer Paternal Aunt   . Breast cancer Paternal Aunt   . Breast cancer Paternal Aunt   . Breast cancer Cousin   . Breast cancer Cousin     Social History Social History   Tobacco Use  .  Smoking status: Former Smoker    Packs/day: 1.00    Years: 30.00    Pack years: 30.00    Types: Cigarettes    Last attempt to quit: 2015    Years since quitting: 3.9  . Smokeless tobacco: Never Used  Substance Use Topics  . Alcohol use: No  . Drug use: No     Allergies   Sulfa antibiotics   Review of Systems Review of Systems  Constitutional: Negative for chills and fever.  Respiratory: Negative for shortness of breath.   Cardiovascular: Negative for chest pain.  Gastrointestinal: Negative for abdominal pain, nausea and vomiting.  Musculoskeletal: Positive for back pain and neck pain.  Neurological: Positive for weakness and numbness. Negative for headaches.  All other systems reviewed and are negative.    Physical Exam Updated Vital Signs BP 138/73 (BP Location: Left Arm)   Pulse 77   Temp 98.3 F (36.8 C) (Oral)   Resp 20   SpO2 99%   Physical Exam  Constitutional: She appears well-developed and well-nourished. No distress.  HENT:  Head: Normocephalic and atraumatic.  Mouth/Throat: Oropharynx is clear and moist.  Eyes: Conjunctivae and EOM are normal. Pupils are equal, round, and reactive to light.  Neck: Normal range of motion. Neck supple.  Cardiovascular: Normal rate, regular rhythm, normal heart sounds and intact distal pulses.  Pulmonary/Chest: Effort normal and breath sounds normal. No respiratory distress.  Abdominal:  Soft. There is no tenderness. There is no guarding.  Musculoskeletal: She exhibits tenderness. She exhibits no edema.  Tenderness across the lumbar spinal region bilaterally.  No noted midline step-off, deformity, or swelling. Tenderness to the right cervical musculature into the right trapezius. Normal motor function intact in all 4 extremities.  Lymphadenopathy:    She has no cervical adenopathy.  Neurological: She is alert.  Initial exam: No noted speech deficits. No aphasia. Patient handles oral secretions without difficulty. No noted swallowing defects.  Decreased sensation and paresthesias to the right first through third digits.  Grip strength equal bilaterally. Decreased sensation to the right lateral thigh otherwise normal sensation throughout the rest of the right leg. 3/5 strength in right hip. 3/5 strength with extension of right knee.  Unable to test dorsiflexion and plantar flexion of the right ankle due to ankle fusion. Cranial nerves III-XII grossly intact.  No facial droop.  Reexam following pain management: Equal grip strength bilaterally. Strength 5/5 in the upper extremities. Strength 5/5 with flexion and extension of the hips and knees bilaterally.  Continued 5/5 strength in the left ankle. Patellar DTRs 2+ bilaterally. Negative Romberg. Gait is slow, but steady and unassisted.  Coordination intact demonstrated with finger to nose testing.    Skin: Skin is warm and dry. Capillary refill takes less than 2 seconds. She is not diaphoretic.  Psychiatric: Her mood appears anxious.  Nursing note and vitals reviewed.    ED Treatments / Results  Labs (all labs ordered are listed, but only abnormal results are displayed) Labs Reviewed - No data to display  EKG  EKG Interpretation None       Radiology Dg Lumbar Spine Complete  Result Date: 03/23/2017 CLINICAL DATA:  Right leg numbness and weakness EXAM: LUMBAR SPINE - COMPLETE 4+ VIEW COMPARISON:  MRI  11/26/2016 FINDINGS: Normal alignment. No fracture. Early degenerative spurring. Mild degenerative facet disease throughout the lumbar spine. SI joints are symmetric and unremarkable. IMPRESSION: No acute bony abnormality. Electronically Signed   By: Rolm Baptise M.D.   On: 03/23/2017 19:39  Procedures Procedures (including critical care time)  Medications Ordered in ED Medications  methocarbamol (ROBAXIN) tablet 1,000 mg (1,000 mg Oral Given 03/23/17 1907)  methylPREDNISolone sodium succinate (SOLU-MEDROL) 125 mg/2 mL injection 80 mg (80 mg Intravenous Given 03/23/17 1907)  ketorolac (TORADOL) 30 MG/ML injection 30 mg (30 mg Intravenous Given 03/23/17 1906)  HYDROcodone-acetaminophen (NORCO/VICODIN) 5-325 MG per tablet 2 tablet (2 tablets Oral Given 03/23/17 2046)     Initial Impression / Assessment and Plan / ED Course  I have reviewed the triage vital signs and the nursing notes.  Pertinent labs & imaging results that were available during my care of the patient were reviewed by me and considered in my medical decision making (see chart for details).  Clinical Course as of Mar 24 2227  Nancy Fetter Mar 23, 2017  2010 Patient reexamined. States she feels much better, pain is now moderate. Strength 5/5 in the lower extremities and equal bilaterally.  Patient able to ambulate without assistance.  [SJ]    Clinical Course User Index [SJ] Kirsta Probert C, PA-C    Patient presents with back pain and neck pain.  Initial deficits on exam resolved following analgesic therapy and were thus thought to be largely due to pain.  Patient will certainly need orthopedic follow-up.  She has close follow-up with her orthopedic surgeon tomorrow. The patient was given instructions for home care as well as return precautions. Patient voices understanding of these instructions, accepts the plan, and is comfortable with discharge.   Final Clinical Impressions(s) / ED Diagnoses   Final diagnoses:  Acute right-sided  low back pain with right-sided sciatica    ED Discharge Orders        Ordered    naproxen (NAPROSYN) 500 MG tablet  2 times daily     03/23/17 2029    methocarbamol (ROBAXIN) 500 MG tablet  2 times daily     03/23/17 2029    lidocaine (LIDODERM) 5 %  Every 24 hours     03/23/17 2029    HYDROcodone-acetaminophen (NORCO/VICODIN) 5-325 MG tablet  Every 4 hours PRN     03/23/17 2029       Lorayne Bender, PA-C 03/23/17 2230    Lorayne Bender, PA-C 03/23/17 2230    Fatima Blank, MD 03/24/17 0010

## 2017-04-08 HISTORY — PX: HIP ARTHROPLASTY: SHX981

## 2017-04-09 ENCOUNTER — Other Ambulatory Visit: Payer: Self-pay | Admitting: Orthopedic Surgery

## 2017-04-09 DIAGNOSIS — M533 Sacrococcygeal disorders, not elsewhere classified: Secondary | ICD-10-CM

## 2017-04-17 ENCOUNTER — Ambulatory Visit: Payer: Self-pay | Admitting: Adult Health

## 2017-04-17 ENCOUNTER — Ambulatory Visit
Admission: RE | Admit: 2017-04-17 | Discharge: 2017-04-17 | Disposition: A | Discharge status: Home / Self Care | Payer: BLUE CROSS/BLUE SHIELD | Source: Ambulatory Visit | Attending: Orthopedic Surgery | Admitting: Orthopedic Surgery

## 2017-04-17 DIAGNOSIS — M533 Sacrococcygeal disorders, not elsewhere classified: Secondary | ICD-10-CM

## 2017-07-30 ENCOUNTER — Other Ambulatory Visit: Payer: Self-pay | Admitting: Orthopedic Surgery

## 2017-07-30 DIAGNOSIS — M545 Low back pain, unspecified: Secondary | ICD-10-CM

## 2017-08-03 ENCOUNTER — Ambulatory Visit
Admission: RE | Admit: 2017-08-03 | Discharge: 2017-08-03 | Disposition: A | Discharge status: Home / Self Care | Payer: BLUE CROSS/BLUE SHIELD | Source: Ambulatory Visit | Attending: Orthopedic Surgery | Admitting: Orthopedic Surgery

## 2017-08-03 DIAGNOSIS — M545 Low back pain, unspecified: Secondary | ICD-10-CM

## 2017-08-18 ENCOUNTER — Encounter: Payer: Self-pay | Admitting: Physician Assistant

## 2017-09-23 ENCOUNTER — Other Ambulatory Visit: Payer: Self-pay | Admitting: Orthopedic Surgery

## 2017-09-23 DIAGNOSIS — M7061 Trochanteric bursitis, right hip: Secondary | ICD-10-CM

## 2017-09-27 ENCOUNTER — Ambulatory Visit
Admission: RE | Admit: 2017-09-27 | Discharge: 2017-09-27 | Disposition: A | Discharge status: Home / Self Care | Payer: BLUE CROSS/BLUE SHIELD | Source: Ambulatory Visit | Attending: Orthopedic Surgery | Admitting: Orthopedic Surgery

## 2017-09-27 DIAGNOSIS — M7061 Trochanteric bursitis, right hip: Secondary | ICD-10-CM

## 2017-10-02 ENCOUNTER — Other Ambulatory Visit: Payer: Self-pay | Admitting: Orthopedic Surgery

## 2017-10-02 DIAGNOSIS — S73191A Other sprain of right hip, initial encounter: Secondary | ICD-10-CM

## 2017-10-03 ENCOUNTER — Ambulatory Visit
Admission: RE | Admit: 2017-10-03 | Discharge: 2017-10-03 | Disposition: A | Discharge status: Home / Self Care | Payer: BLUE CROSS/BLUE SHIELD | Source: Ambulatory Visit | Attending: Orthopedic Surgery | Admitting: Orthopedic Surgery

## 2017-10-03 DIAGNOSIS — S73191A Other sprain of right hip, initial encounter: Secondary | ICD-10-CM

## 2017-10-03 MED ORDER — IOPAMIDOL (ISOVUE-M 200) INJECTION 41%
1.0000 mL | Freq: Once | INTRAMUSCULAR | Status: AC
  Administered 2017-10-03: 1 mL via INTRA_ARTICULAR

## 2017-10-03 MED ORDER — METHYLPREDNISOLONE ACETATE 40 MG/ML INJ SUSP (RADIOLOG
120.0000 mg | Freq: Once | INTRAMUSCULAR | Status: AC
  Administered 2017-10-03: 120 mg via INTRA_ARTICULAR

## 2018-05-12 ENCOUNTER — Ambulatory Visit
Admission: RE | Admit: 2018-05-12 | Discharge: 2018-05-12 | Disposition: A | Discharge status: Home / Self Care | Payer: BLUE CROSS/BLUE SHIELD | Source: Ambulatory Visit | Attending: Family Medicine | Admitting: Family Medicine

## 2018-05-12 ENCOUNTER — Other Ambulatory Visit: Payer: Self-pay | Admitting: Family Medicine

## 2018-05-12 DIAGNOSIS — R059 Cough, unspecified: Secondary | ICD-10-CM

## 2018-05-12 DIAGNOSIS — R05 Cough: Secondary | ICD-10-CM

## 2018-06-19 ENCOUNTER — Other Ambulatory Visit: Payer: Self-pay | Admitting: Orthopedic Surgery

## 2018-06-19 DIAGNOSIS — M545 Low back pain, unspecified: Secondary | ICD-10-CM

## 2018-06-19 DIAGNOSIS — M533 Sacrococcygeal disorders, not elsewhere classified: Secondary | ICD-10-CM

## 2018-06-29 ENCOUNTER — Ambulatory Visit
Admission: RE | Admit: 2018-06-29 | Discharge: 2018-06-29 | Disposition: A | Discharge status: Home / Self Care | Payer: BLUE CROSS/BLUE SHIELD | Source: Ambulatory Visit | Attending: Orthopedic Surgery | Admitting: Orthopedic Surgery

## 2018-06-29 ENCOUNTER — Other Ambulatory Visit: Payer: Self-pay

## 2018-06-29 DIAGNOSIS — M545 Low back pain, unspecified: Secondary | ICD-10-CM

## 2018-06-29 DIAGNOSIS — M533 Sacrococcygeal disorders, not elsewhere classified: Secondary | ICD-10-CM

## 2018-09-01 ENCOUNTER — Encounter (HOSPITAL_COMMUNITY): Payer: Self-pay

## 2018-09-01 ENCOUNTER — Emergency Department (HOSPITAL_COMMUNITY): Payer: BLUE CROSS/BLUE SHIELD

## 2018-09-01 ENCOUNTER — Other Ambulatory Visit: Payer: Self-pay

## 2018-09-01 ENCOUNTER — Emergency Department (HOSPITAL_COMMUNITY)
Admission: EM | Admit: 2018-09-01 | Discharge: 2018-09-01 | Disposition: A | Discharge status: Home / Self Care | Payer: BLUE CROSS/BLUE SHIELD | Attending: Emergency Medicine | Admitting: Emergency Medicine

## 2018-09-01 DIAGNOSIS — M5441 Lumbago with sciatica, right side: Secondary | ICD-10-CM | POA: Diagnosis not present

## 2018-09-01 DIAGNOSIS — I10 Essential (primary) hypertension: Secondary | ICD-10-CM | POA: Diagnosis not present

## 2018-09-01 DIAGNOSIS — Z87891 Personal history of nicotine dependence: Secondary | ICD-10-CM | POA: Diagnosis not present

## 2018-09-01 DIAGNOSIS — J449 Chronic obstructive pulmonary disease, unspecified: Secondary | ICD-10-CM | POA: Diagnosis not present

## 2018-09-01 DIAGNOSIS — Z8551 Personal history of malignant neoplasm of bladder: Secondary | ICD-10-CM | POA: Insufficient documentation

## 2018-09-01 DIAGNOSIS — Z79899 Other long term (current) drug therapy: Secondary | ICD-10-CM | POA: Insufficient documentation

## 2018-09-01 DIAGNOSIS — M545 Low back pain: Secondary | ICD-10-CM | POA: Diagnosis present

## 2018-09-01 DIAGNOSIS — E039 Hypothyroidism, unspecified: Secondary | ICD-10-CM | POA: Diagnosis not present

## 2018-09-01 MED ORDER — LIDOCAINE 5 % EX PTCH: 1.0000 | MEDICATED_PATCH | CUTANEOUS | 0 refills | Status: DC

## 2018-09-01 MED ORDER — PREDNISONE 10 MG PO TABS: 40.0000 mg | ORAL_TABLET | Freq: Every day | ORAL | 0 refills | Status: AC

## 2018-09-01 MED ORDER — KETOROLAC TROMETHAMINE 15 MG/ML IJ SOLN: 15.0000 mg | Freq: Once | INTRAMUSCULAR | Status: DC

## 2018-09-01 MED ORDER — KETOROLAC TROMETHAMINE 15 MG/ML IJ SOLN
15.0000 mg | Freq: Once | INTRAMUSCULAR | Status: AC
  Administered 2018-09-01: 15 mg via INTRAMUSCULAR
  Filled 2018-09-01: qty 1

## 2018-09-01 MED ORDER — LIDOCAINE 5 % EX PTCH
1.0000 | MEDICATED_PATCH | CUTANEOUS | Status: DC
  Administered 2018-09-01: 1 via TRANSDERMAL
  Filled 2018-09-01: qty 1

## 2018-09-01 MED ORDER — METHOCARBAMOL 500 MG PO TABS: 500.0000 mg | ORAL_TABLET | Freq: Two times a day (BID) | ORAL | 0 refills | Status: AC

## 2018-09-01 MED ORDER — HYDROCODONE-ACETAMINOPHEN 5-325 MG PO TABS
1.0000 | ORAL_TABLET | Freq: Once | ORAL | Status: AC
  Administered 2018-09-01: 1 via ORAL
  Filled 2018-09-01: qty 1

## 2018-09-01 NOTE — ED Notes (Signed)
Patient transported to X-ray 

## 2018-09-01 NOTE — ED Notes (Signed)
ED Provider at bedside. 

## 2018-09-01 NOTE — ED Provider Notes (Signed)
[facility] DEPT Provider Note   CSN: [id number] Arrival date & time: 09/01/18  1953    History   Chief Complaint Chief Complaint  Patient presents with  . Back Pain    HPI [name] is a 58 y.o. female with history of bladder cancer, COPD, fibromyalgia, hypertension, hyperlipidemia, chronic back pain presents today with back pain.  Patient reports that she was sitting down at her home today when she attempted to stand up and felt a pop sensation to her right-sided lower back.  Patient endorses an immediate severe aching pain constant worsened with movement and palpation without alleviating factors.  Patient reports similar pain with prior back pain flares.  Patient took 1 Flexeril prior to arrival without relief of her symptoms.  Patient reports occasional tingling sensation with severe pain to her right leg states that this is happened before with severe pain.  Chart review reveals patient seen in ED on 03/23/2017 for acute right-sided lower back pain with right-sided sciatica.  Patient denies saddle area paresthesias, bowel/bladder incontinence, urinary retention, fever/chills, abdominal pain, dysuria/hematuria, fall/injury, weakness or any additional concerns.  Patient with history of hysterectomy, denies history of CKD or gastric ulcers.  Reports she has had Toradol in the past for back pain with improvement of symptoms.  Of note patient had MRI performed of lumbar spine on 06/29/2018. IMPRESSION: 1. Multilevel lumbar spondylosis as described above is overall similar to prior study.    HPI  Past Medical History:  Diagnosis Date  . ADD (attention deficit disorder)   . Anxiety   . Bladder neoplasm   . Cancer [facility]) 2015   Bladder cancer  . COPD (chronic obstructive pulmonary disease) ([place])   . Depression   . Fibroids   . Fibromyalgia   . H/O bronchitis    treated within the last week w steroids, antibx tessalon pearls.   . H/O sebaceous  cyst 07/18/14   left breast   . Hemorrhoid   . History of melanoma excision    leg  . Hyperlipidemia   . Hypertension    normal now, only took medications for 1 month- thinks it was from pain.  Marland Kitchen Hypothyroidism   . Iron deficiency anemia   . PONV (postoperative nausea and vomiting)    used a scop patch last surg-did well  . Prediabetes   . Restless leg syndrome   . Shortness of breath dyspnea    with exertion  . Shoulder impingement    right  . SUI (stress urinary incontinence, female)   . Wears glasses     Patient Active Problem List   Diagnosis Date Noted  . Back pain 11/24/2015  . Radicular pain 08/02/2015  . Peripheral sensory neuropathy 05/10/2015  . Obesity 07/05/2014  . Vitamin D deficiency 07/05/2014  . Thoracic aorta atherosclerosis ([place]) 07/05/2014  . Bladder cancer ([place])   . Hypothyroidism   . Restless leg syndrome   . Fibromyalgia   . Labile hypertension   . Borderline hyperlipidemia   . COPD (chronic obstructive pulmonary disease) ([place])   . GERD (gastroesophageal reflux disease)   . Prediabetes   . Bipolar 2 disorder, major depressive episode [facility])     Past Surgical History:  Procedure Laterality Date  . ABDOMINAL HYSTERECTOMY    . ANKLE FUSION Right 2011  . ANTERIOR CERVICAL DECOMP/DISCECTOMY FUSION N/A 09/01/2013   Procedure: Anterior cervical decompression fusion, cervical 5-6 with instrumentation and allograft;  Surgeon: Sinclair Ship, MD;  Location: [facility];  Service: Orthopedics;  Laterality: N/A;  Anterior cervical decompression fusion, cervical 5-6 with instrumentation and allograft  . ANTERIOR CERVICAL DECOMP/DISCECTOMY FUSION N/A 08/02/2015   Procedure: ANTERIOR CERVICAL DECOMPRESSION FUSION CERVICAL 6 - THORACIC 1 WITH INSTRUMENTATION AND ALLOGRAFT;  Surgeon: Phylliss Bob, MD;  Location: [facility];  Service: Orthopedics;  Laterality: N/A;  ANTERIOR CERVICAL DECOMPRESSION FUSION, CERVICAL 6-7,CERVICAL 7-THORACIC 7 WITH INSTRUMENTATION AND  ALLOGRAFT  . CATARACT EXTRACTION W/ INTRAOCULAR LENS IMPLANT Bilateral   . CLOSED REDUCTION AND PINNING LEFT FIFTH  PROXIMAL PHALANGEAL FX  10-09-2005  . CYSTOSCOPY W/ RETROGRADES Left 01/11/2014   Procedure: CYSTOSCOPY WITH RETROGRADE PYELOGRAM;  Surgeon: Festus Aloe, MD;  Location: [facility];  Service: Urology;  Laterality: Left;  . CYSTOSCOPY WITH STENT PLACEMENT Left 01/11/2014   Procedure: CYSTOSCOPY WITH STENT PLACEMENT;  Surgeon: Festus Aloe, MD;  Location: [facility];  Service: Urology;  Laterality: Left;  . [place] ENDOMETRIAL ABLATION  07-16-2006  . KNEE ARTHROSCOPY WITH MEDIAL MENISECTOMY Right 05/22/2012   Procedure: KNEE ARTHROSCOPY WITH MEDIAL MENISECTOMY and Medial [place] excision;  Surgeon: Alta Corning, MD;  Location: [place];  Service: Orthopedics;  Laterality: Right;  . LAPAROSCOPIC ASSISTED VAGINAL HYSTERECTOMY N/A 08/29/2014   Procedure: LAPAROSCOPIC ASSISTED VAGINAL HYSTERECTOMY;  Surgeon: Molli Posey, MD;  Location: [facility];  Service: Gynecology;  Laterality: N/A;  . LAPAROSCOPIC CHOLECYSTECTOMY  2005  . LAPAROTOMY N/A 08/29/2014   Procedure: EXPLORATORY LAPAROTOMY WITH LIGATION OF BLEEDER;  Surgeon: Molli Posey, MD;  Location: [facility];  Service: Gynecology;  Laterality: N/A;  . POSTERIOR CERVICAL FUSION/FORAMINOTOMY N/A 01/25/2015   Procedure: POSTERIOR CERVICAL DECOMPRESSSION CERVICAL 7-THORACIC 1;  Surgeon: Phylliss Bob, MD;  Location: [place];  Service: Orthopedics;  Laterality: N/A;  Posterior cervical decompression, cervical 5-6, cervical 6-7, cervical 7-thoracic 1   . PUBOVAGINAL SLING N/A 08/29/2014   Procedure: Okey Dupre SINGLE INCISION SLING;  Surgeon: Molli Posey, MD;  Location: [facility];  Service: Gynecology;  Laterality: N/A;  . SALPINGOOPHORECTOMY Bilateral 08/29/2014   Procedure: SALPINGO OOPHORECTOMY;  Surgeon: Molli Posey, MD;   Location: [facility];  Service: Gynecology;  Laterality: Bilateral;  . SHOULDER ARTHROSCOPY W/ SUBACROMIAL DECOMPRESSION AND DISTAL CLAVICLE EXCISION Left 11-27-2011   W/  DEBRIDEMENT  . SHOULDER ARTHROSCOPY WITH BICEPSTENOTOMY Right 12/21/2014   Procedure: SHOULDER ARTHROSCOPY WITH BICEPSTENOTOMY;  Surgeon: Dorna Leitz, MD;  Location: [place];  Service: Orthopedics;  Laterality: Right;  . SHOULDER ARTHROSCOPY WITH DISTAL CLAVICLE RESECTION Right 12/21/2014   Procedure: SHOULDER ARTHROSCOPY WITH DISTAL CLAVICLE RESECTION;  Surgeon: Dorna Leitz, MD;  Location: [facility];  Service: Orthopedics;  Laterality: Right;  . SHOULDER ARTHROSCOPY WITH OPEN ROTATOR CUFF REPAIR Right 07/12/2015   Procedure: SHOULDER ARTHROSCOPY WITH ACROMIOPLASTY AND OPEN ROTATOR CUFF REPAIR;  Surgeon: Dorna Leitz, MD;  Location: [place];  Service: Orthopedics;  Laterality: Right;  . SHOULDER ARTHROSCOPY WITH SUBACROMIAL DECOMPRESSION Left 11/10/2013   Procedure: SHOULDER ARTHROSCOPY WITH SUBACROMIAL DECOMPRESSION WITH ROTATOR CUFF REPAIR AND BICEPS TENODESIS;  Surgeon: Alta Corning, MD;  Location: [place];  Service: Orthopedics;  Laterality: Left;  . SHOULDER ARTHROSCOPY WITH SUBACROMIAL DECOMPRESSION Right 12/21/2014   Procedure: SHOULDER ARTHROSCOPY WITH SUBACROMIAL DECOMPRESSION;  Surgeon: Dorna Leitz, MD;  Location: [facility];  Service: Orthopedics;  Laterality: Right;  . TONSILLECTOMY  age 58  . TRANSURETHRAL RESECTION OF BLADDER TUMOR N/A 01/11/2014   Procedure: TRANSURETHRAL RESECTION OF BLADDER TUMOR (  TURBT);  Surgeon: Festus Aloe, MD;  [facility];  Service: Urology;  Laterality: N/A;  . TUBAL LIGATION  1980     OB History   No obstetric history on file.      Home Medications    Prior to Admission medications   Medication Sig Start Date End Date Taking? Authorizing Provider  albuterol  (VENTOLIN HFA) 108 (90 Base) MCG/ACT inhaler Inhale 2 puffs into the lungs every 4 (four) hours as needed for wheezing or shortness of breath. 01/13/17   Unk Pinto, MD  amphetamine-dextroamphetamine (ADDERALL) 20 MG tablet Take 1 tablet (20 mg total) daily by mouth. 02/14/17   Vicie Mutters, PA-C  aspirin 81 MG tablet Take 81 mg by mouth daily.    [provider]  cholecalciferol (VITAMIN D) 1000 UNITS tablet Take 7,000 Units by mouth at bedtime.     [provider]  clonazePAM (KLONOPIN) 2 MG tablet Take 1 tablet (2 mg total) by mouth at bedtime. for anxiety 01/13/17   Liane Comber, NP  DULoxetine (CYMBALTA) 30 MG capsule Take 1 capsule (30 mg total) by mouth daily. Patient not taking: Reported on 01/13/2017 01/13/17   Unk Pinto, MD  gabapentin (NEURONTIN) 100 MG capsule Take 1 capsule (100 mg total) by mouth 3 (three) times daily as needed. Fibromyalgia pain Patient not taking: Reported on 01/13/2017 01/13/17   Unk Pinto, MD  HYDROcodone-acetaminophen (NORCO/VICODIN) 5-325 MG tablet Take 1-2 tablets by mouth every 4 (four) hours as needed for severe pain. 03/23/17   Joy, Shawn C, PA-C  lidocaine (LIDODERM) 5 % Place 1 patch onto the skin daily. Remove & Discard patch within 12 hours or as directed by MD 09/01/18   Deliah Boston, PA-C  MAGNESIUM PO Take 450 mg by mouth daily.    [provider]  methocarbamol (ROBAXIN) 500 MG tablet Take 1 tablet (500 mg total) by mouth 2 (two) times daily for 7 days. 09/01/18 09/08/18  Nuala Alpha A, PA-C  naproxen (NAPROSYN) 500 MG tablet Take 1 tablet (500 mg total) by mouth 2 (two) times daily. 03/23/17   Joy, Shawn C, PA-C  predniSONE (DELTASONE) 10 MG tablet Take 4 tablets (40 mg total) by mouth daily for 5 days. 09/01/18 09/06/18  Nuala Alpha A, PA-C  traMADol (ULTRAM) 50 MG tablet Take 50 mg by mouth every 8 (eight) hours as needed for moderate pain or severe pain.  06/25/16   [provider]     Family History Family History  Problem Relation Age of Onset  . Cancer Father        Lung and stomach   . Heart disease Father   . Hypertension Father   . Stroke Father   . Heart failure Paternal Grandfather   . Cancer Paternal Grandfather        colon cancer  . Ovarian cancer Mother 9  . Breast cancer Mother        dx in her late 70s-50s  . Thyroid cancer Mother   . Ovarian cancer Sister 68  . Cancer Brother        abdominal tumor  . Ovarian cancer Maternal Aunt   . Breast cancer Maternal Aunt   . Breast cancer Maternal Uncle   . Breast cancer Paternal Aunt   . Ovarian cancer Sister 38  . Ovarian cancer Sister 2  . Cancer Brother        abdominal tumor  . Breast cancer Paternal Aunt   . Breast cancer Paternal  Aunt   . Breast cancer Paternal Aunt   . Breast cancer Cousin   . Breast cancer Cousin     Social History Social History   Tobacco Use  . Smoking status: Former Smoker    Packs/day: 1.00    Years: 30.00    Pack years: 30.00    Types: Cigarettes    Last attempt to quit: 2015    Years since quitting: 5.4  . Smokeless tobacco: Never Used  Substance Use Topics  . Alcohol use: No  . Drug use: No     Allergies   Sulfa antibiotics   Review of Systems Review of Systems  Constitutional: Negative.  Negative for chills and fever.  Respiratory: Negative.  Negative for shortness of breath.   Cardiovascular: Negative.  Negative for chest pain.  Gastrointestinal: Negative.  Negative for abdominal pain, nausea and vomiting.  Genitourinary: Negative for dysuria, hematuria and vaginal discharge.  Musculoskeletal: Positive for back pain. Negative for neck pain.  Neurological: Positive for numbness (Occasional tingling significant right leg, none at this time). Negative for weakness and headaches.   Physical Exam Updated Vital Signs BP (!) 157/81   Pulse 94   Temp 98.2 F (36.8 C)   Resp (!) 22   SpO2 96%   Physical Exam Constitutional:      General:  She is not in acute distress.    Appearance: Normal appearance. She is well-developed. She is not ill-appearing or diaphoretic.  HENT:     Head: Normocephalic and atraumatic.     Right Ear: External ear normal.     Left Ear: External ear normal.     Nose: Nose normal.  Eyes:     General: Vision grossly intact. Gaze aligned appropriately.     Pupils: Pupils are equal, round, and reactive to light.  Neck:     Musculoskeletal: Normal range of motion.     Trachea: Trachea and phonation normal. No tracheal deviation.  Cardiovascular:     Pulses:          Dorsalis pedis pulses are 2+ on the right side and 2+ on the left side.       Posterior tibial pulses are 2+ on the right side and 2+ on the left side.  Pulmonary:     Effort: Pulmonary effort is normal. No respiratory distress.  Abdominal:     General: There is no distension.     Palpations: Abdomen is soft.     Tenderness: There is no abdominal tenderness. There is no guarding or rebound.  Musculoskeletal: Normal range of motion.       Back:     Comments: No midline C/T/L spinal tenderness to palpation, no deformity, crepitus, or step-off noted. No sign of injury to the neck or back.  Patient with diffuse right-sided lumbar paraspinal muscular tenderness to palpation, additionally with tenderness overlying right gluteal muscle without sign of injury.  Feet:     Right foot:     Protective Sensation: 5 sites tested. 5 sites sensed.     Left foot:     Protective Sensation: 5 sites tested. 5 sites sensed.  Skin:    General: Skin is warm and dry.  Neurological:     Mental Status: She is alert.     GCS: GCS eye subscore is 4. GCS verbal subscore is 5. GCS motor subscore is 6.     Comments: Speech is clear and goal oriented, follows commands Major Cranial nerves without deficit, no facial droop Normal bilateral  grip strength, patient is unable to dorsi or plantar flex her right ankle due to ankle fusion, full movement with appropriate  strength of the toes, knees and at hip with some increase in pain. Sensation normal to light and sharp touch Moves extremities without ataxia, coordination intact  Psychiatric:        Behavior: Behavior normal.    ED Treatments / Results  Labs (all labs ordered are listed, but only abnormal results are displayed) Labs Reviewed - No data to display  EKG None  Radiology Dg Lumbar Spine Complete  Result Date: 09/01/2018 CLINICAL DATA:  Low back pain following sitting 1 hour ago, initial encounter EXAM: LUMBAR SPINE - COMPLETE 4+ VIEW COMPARISON:  06/29/2018 MRI FINDINGS: Five lumbar type vertebral bodies are well visualized. Postsurgical changes in the right sacroiliac joint are noted. Mild osteophytic changes are seen. No pars defects are noted. No anterolisthesis is seen. No bony abnormality is noted. IMPRESSION: Mild degenerative change without acute abnormality. Electronically Signed   By: Inez Catalina M.D.   On: 09/01/2018 21:01    Procedures Procedures (including critical care time)  Medications Ordered in ED Medications  lidocaine (LIDODERM) 5 % 1 patch (1 patch Transdermal Patch Applied 09/01/18 2109)  ketorolac (TORADOL) 15 MG/ML injection 15 mg (15 mg Intramuscular Given 09/01/18 2109)  HYDROcodone-acetaminophen (NORCO/VICODIN) 5-325 MG per tablet 1 tablet (1 tablet Oral Given 09/01/18 2150)     Initial Impression / Assessment and Plan / ED Course  I have reviewed the triage vital signs and the nursing notes.  Pertinent labs & imaging results that were available during my care of the patient were reviewed by me and considered in my medical decision making (see chart for details).    [name] is a 58 y.o. female presenting with right sided back pain after getting out of a chair today around 6 PM.  She describes pain as similar to previous back pain exacerbations.  She denies history of trauma, fever, IV drug use, night sweats, weight loss, saddle anesthesia, urinary  rentention, bowel/bladder incontinence. No neurological deficits and normal neuro exam.   Suspect musculoskeletal etiology of patient's pain. Pain is consistently reproducible with palpation of the right lumbar paraspinal musculature and right leg raise. Abdomen soft/nontender and without pulsatile mass. Patient with equal pedal pulses. Doubt spinal epidural abscess, cauda equina or AAA.  DG lumbar spine: IMPRESSION: Mild degenerative change without acute abnormality.  Patient denies history of CKD or gastric ulcers/bleeding, she has been given 15 mg IM Toradol today.  Advised to avoid further NSAIDs for 48 hours. Reassessed states improvement of pain and is requesting discharge.  I advised that she follow-up with PCP/established Ortho, call office tomorrow to schedule an appointment.  Robaxin 500mg  BID prescribed. Patient informed to avoid driving or operating heavy machinery while taking muscle relaxer.  Prednisone 40 mg daily x5 days prescribed for sciatic-like symptoms, she denies history of diabetes or adverse reactions to steroid medications in the past.  Patient requesting additional pain medicine just prior to discharge, she has been given 1 Norco 5 mg, she has a ride home, discussed that narcotic prescriptions are not appropriate at this time, agreeable.  At this time there does not appear to be any evidence of an acute emergency medical condition and the patient appears stable for discharge with appropriate outpatient follow up. Diagnosis was discussed with patient who verbalizes understanding of care plan and is agreeable to discharge. I have discussed return precautions with patient who verbalizes understanding of  return precautions. Patient encouraged to follow-up with their PCP. All questions answered.  Patient has been discharged in good condition.    Note: Portions of this report may have been transcribed using voice recognition software. Every effort was made to ensure accuracy;  however, inadvertent computerized transcription errors may still be present. Final Clinical Impressions(s) / ED Diagnoses   Final diagnoses:  Right-sided low back pain with right-sided sciatica, unspecified chronicity    ED Discharge Orders         Ordered    predniSONE (DELTASONE) 10 MG tablet  Daily     09/01/18 2129    methocarbamol (ROBAXIN) 500 MG tablet  2 times daily     09/01/18 2129    lidocaine (LIDODERM) 5 %  Every 24 hours     09/01/18 2129           Gari Crown 09/01/18 2222    Julianne Rice, MD 09/02/18 [phone]

## 2018-09-01 NOTE — ED Triage Notes (Signed)
Pt complains of low back pain after sitting down and feeling a pop about one hour ago

## 2018-09-01 NOTE — Discharge Instructions (Addendum)
You have been diagnosed today with right-sided lower back pain with sciatica of the right side.  At this time there does not appear to be the presence of an emergent medical condition, however there is always the potential for conditions to change. Please read and follow the below instructions.  Please return to the Emergency Department immediately for any new or worsening symptoms. Please be sure to follow up with your Primary Care Provider within one week regarding your visit today; please call their office to schedule an appointment even if you are feeling better for a follow-up visit. You have been given a medication called Toradol today, this is an NSAID medication.  Please avoid further NSAID use including ibuprofen and naproxen for the next 2 days.  Please drink plenty water and get plenty of rest. You may use the medication Robaxin as prescribed, this is a muscle relaxer.  Do not take this medication with Flexeril or other muscle relaxing medications as this will potentiate side effects.  Do not drink alcohol or take other sedating medications with Robaxin.  Do not drive or operate heavy machinery as will make you drowsy. You may use Lidoderm patches as prescribed to help with your pain. You may use the medication prednisone as prescribed to help with your symptoms, this medication should help with your sciatica-like symptoms.  Prednisone may make you feel jittery, take medication and morning.  Get help right away if: You cannot control when you pee (urinate) or poop (have a bowel movement). You have weakness in any of these areas and it gets worse. Lower back. Lower belly (pelvis). Butt (buttocks). Legs. You have redness or swelling of your back. You have a burning feeling when you pee. Get help right away if: You develop new bowel or bladder control problems. You have unusual weakness or numbness in your arms or legs. You develop nausea or vomiting. You develop abdominal pain. You  feel faint. Any new/concerning or worsening symptoms  Please read the additional information packets attached to your discharge summary.  Do not take your medicine if  develop an itchy rash, swelling in your mouth or lips, or difficulty breathing.  Call 911/seek immediate emergency medical attention if this occurs.

## 2018-09-15 ENCOUNTER — Other Ambulatory Visit: Payer: Self-pay | Admitting: Orthopedic Surgery

## 2018-09-15 DIAGNOSIS — G5700 Lesion of sciatic nerve, unspecified lower limb: Secondary | ICD-10-CM

## 2018-10-01 ENCOUNTER — Ambulatory Visit
Admission: RE | Admit: 2018-10-01 | Discharge: 2018-10-01 | Disposition: A | Discharge status: Home / Self Care | Payer: BC Managed Care – PPO | Source: Ambulatory Visit | Attending: Orthopedic Surgery | Admitting: Orthopedic Surgery

## 2018-10-01 DIAGNOSIS — G5700 Lesion of sciatic nerve, unspecified lower limb: Secondary | ICD-10-CM

## 2019-04-09 HISTORY — PX: COLONOSCOPY: SHX174

## 2019-05-31 DIAGNOSIS — R3915 Urgency of urination: Secondary | ICD-10-CM | POA: Diagnosis not present

## 2019-05-31 DIAGNOSIS — N3941 Urge incontinence: Secondary | ICD-10-CM | POA: Diagnosis not present

## 2019-05-31 DIAGNOSIS — Z8551 Personal history of malignant neoplasm of bladder: Secondary | ICD-10-CM | POA: Diagnosis not present

## 2019-07-06 DIAGNOSIS — M545 Low back pain: Secondary | ICD-10-CM | POA: Diagnosis not present

## 2019-07-15 DIAGNOSIS — Z1331 Encounter for screening for depression: Secondary | ICD-10-CM | POA: Diagnosis not present

## 2019-07-15 DIAGNOSIS — F902 Attention-deficit hyperactivity disorder, combined type: Secondary | ICD-10-CM | POA: Diagnosis not present

## 2019-07-15 DIAGNOSIS — F4310 Post-traumatic stress disorder, unspecified: Secondary | ICD-10-CM | POA: Diagnosis not present

## 2019-07-15 DIAGNOSIS — Z79899 Other long term (current) drug therapy: Secondary | ICD-10-CM | POA: Diagnosis not present

## 2019-07-15 DIAGNOSIS — F419 Anxiety disorder, unspecified: Secondary | ICD-10-CM | POA: Diagnosis not present

## 2019-07-15 DIAGNOSIS — F321 Major depressive disorder, single episode, moderate: Secondary | ICD-10-CM | POA: Diagnosis not present

## 2019-07-15 DIAGNOSIS — M797 Fibromyalgia: Secondary | ICD-10-CM | POA: Diagnosis not present

## 2019-07-15 DIAGNOSIS — E785 Hyperlipidemia, unspecified: Secondary | ICD-10-CM | POA: Diagnosis not present

## 2019-07-15 DIAGNOSIS — F319 Bipolar disorder, unspecified: Secondary | ICD-10-CM | POA: Diagnosis not present

## 2019-07-15 DIAGNOSIS — R7303 Prediabetes: Secondary | ICD-10-CM | POA: Diagnosis not present

## 2019-07-15 DIAGNOSIS — F422 Mixed obsessional thoughts and acts: Secondary | ICD-10-CM | POA: Diagnosis not present

## 2019-07-15 DIAGNOSIS — R7989 Other specified abnormal findings of blood chemistry: Secondary | ICD-10-CM | POA: Diagnosis not present

## 2019-07-16 DIAGNOSIS — M545 Low back pain: Secondary | ICD-10-CM | POA: Diagnosis not present

## 2019-08-05 DIAGNOSIS — M5136 Other intervertebral disc degeneration, lumbar region: Secondary | ICD-10-CM | POA: Diagnosis not present

## 2019-08-05 DIAGNOSIS — M5431 Sciatica, right side: Secondary | ICD-10-CM | POA: Diagnosis not present

## 2019-08-05 DIAGNOSIS — M545 Low back pain: Secondary | ICD-10-CM | POA: Diagnosis not present

## 2019-08-30 DIAGNOSIS — Z1231 Encounter for screening mammogram for malignant neoplasm of breast: Secondary | ICD-10-CM | POA: Diagnosis not present

## 2019-10-13 ENCOUNTER — Other Ambulatory Visit: Payer: Self-pay

## 2019-10-13 ENCOUNTER — Ambulatory Visit: Payer: PPO | Admitting: Neurology

## 2019-10-13 ENCOUNTER — Encounter: Payer: Self-pay | Admitting: Neurology

## 2019-10-13 ENCOUNTER — Telehealth: Payer: Self-pay | Admitting: Neurology

## 2019-10-13 VITALS — BP 140/74 | HR 61 | Ht 64.0 in | Wt 228.0 lb

## 2019-10-13 DIAGNOSIS — M5431 Sciatica, right side: Secondary | ICD-10-CM

## 2019-10-13 DIAGNOSIS — M797 Fibromyalgia: Secondary | ICD-10-CM | POA: Diagnosis not present

## 2019-10-13 HISTORY — DX: Sciatica, right side: M54.31

## 2019-10-13 NOTE — Progress Notes (Signed)
Reason for visit: Right-sided sciatica  Referring physician: Dr. Anselmo Rod is a 59 y.o. female  History of present illness:  [name] is a 59 year old right-handed white female with a longstanding history of right-sided leg pain.  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  Within a year after the procedure she began having some pain and discomfort affecting the right leg.  The patient indicates that the pain is much worse with weightbearing, when she stands up she gets numb and tingly sensations that began in the hip and buttock area and goes down the leg into the anterior and medial thigh and down below the knee to the foot.  The patient has some days where she feels normal without any pain, but these days are rare.  The patient feels best when she is lying down flat.  She has been seen by Dr. Lynann Bologna previously and underwent a right SI joint injection and eventually a fusion procedure that was done about 4 years ago.  She was then seen by Dr. Berenice Primas and underwent a right total hip replacement for ongoing pain.  These procedures did not help her pain and her pain actually worsened following the SI joint fusion procedure.  The patient at times feels as if the leg will give away when she has significant pain.  She may have pain all the way into the center of the back.  She has had multiple MRI procedures of the low back, the most recent was done through Dr. Tonita Cong and revealed moderate left and mild to moderate right L5-S1 neuroforaminal narrowing with mild left L4-5 neuroforaminal narrowing and minimal anterolisthesis of L3 on L4.  No structural etiology of her pain has been found.  The patient is had multiple injections involving epidural injections, SI joint injections, and piriformis injections without sustained benefit.  She has had EMG and nerve conduction study done through this office in  2018 that was normal.  She has had vascular studies of the veins and arteries in the legs that were normal.  She currently is on low-dose gabapentin taking 300 mg at night, gabapentin during the daytime resulted in too much drowsiness.  She also takes low-dose Cymbalta.  She has been gaining some weight recently.  She denies issues controlling the bowels or the bladder.  She is sent to this office for further evaluation.  In the past, she has also had cervical spine surgery at C5-6, C6-7, and C7-T1 levels done by Dr. Lynann Bologna.  Past Medical History:  Diagnosis Date  . ADD (attention deficit disorder)   . Anxiety   . Bladder neoplasm   . Cancer [facility]) 2015   Bladder cancer  . COPD (chronic obstructive pulmonary disease) ([place])   . Depression   . Fibroids   . Fibromyalgia   . H/O bronchitis    treated within the last week w steroids, antibx tessalon pearls.   . H/O sebaceous cyst 07/18/14   left breast   . Hemorrhoid   . History of melanoma excision    leg  . Hyperlipidemia   . Hypertension    normal now, only took medications for 1 month- thinks it was from pain.  Marland Kitchen Hypothyroidism   . Iron deficiency anemia   . PONV (postoperative nausea and vomiting)    used a scop patch last surg-did well  . Prediabetes   . Restless leg  syndrome   . Shortness of breath dyspnea    with exertion  . Shoulder impingement    right  . SUI (stress urinary incontinence, female)   . Wears glasses     Past Surgical History:  Procedure Laterality Date  . ABDOMINAL HYSTERECTOMY    . ANKLE FUSION Right 2011  . ANTERIOR CERVICAL DECOMP/DISCECTOMY FUSION N/A 09/01/2013   Procedure: Anterior cervical decompression fusion, cervical 5-6 with instrumentation and allograft;  Surgeon: Sinclair Ship, MD;  Location: [place];  Service: Orthopedics;  Laterality: N/A;  Anterior cervical decompression fusion, cervical 5-6 with instrumentation and allograft  . ANTERIOR CERVICAL DECOMP/DISCECTOMY FUSION N/A  08/02/2015   Procedure: ANTERIOR CERVICAL DECOMPRESSION FUSION CERVICAL 6 - THORACIC 1 WITH INSTRUMENTATION AND ALLOGRAFT;  Surgeon: Phylliss Bob, MD;  Location: [place];  Service: Orthopedics;  Laterality: N/A;  ANTERIOR CERVICAL DECOMPRESSION FUSION, CERVICAL 6-7,CERVICAL 7-THORACIC 7 WITH INSTRUMENTATION AND ALLOGRAFT  . CATARACT EXTRACTION W/ INTRAOCULAR LENS IMPLANT Bilateral   . CLOSED REDUCTION AND PINNING LEFT FIFTH  PROXIMAL PHALANGEAL FX  10-09-2005  . CYSTOSCOPY W/ RETROGRADES Left 01/11/2014   Procedure: CYSTOSCOPY WITH RETROGRADE PYELOGRAM;  Surgeon: Festus Aloe, MD;  Location: [facility];  Service: Urology;  Laterality: Left;  . CYSTOSCOPY WITH STENT PLACEMENT Left 01/11/2014   Procedure: CYSTOSCOPY WITH STENT PLACEMENT;  Surgeon: Festus Aloe, MD;  Location: [facility];  Service: Urology;  Laterality: Left;  . [place]de ENDOMETRIAL ABLATION  07-16-2006  . KNEE ARTHROSCOPY WITH MEDIAL MENISECTOMY Right 05/22/2012   Procedure: KNEE ARTHROSCOPY WITH MEDIAL MENISECTOMY and Medial Plica excision;  Surgeon: Alta Corning, MD;  Location: [place];  Service: Orthopedics;  Laterality: Right;  . LAPAROSCOPIC ASSISTED VAGINAL HYSTERECTOMY N/A 08/29/2014   Procedure: LAPAROSCOPIC ASSISTED VAGINAL HYSTERECTOMY;  Surgeon: Molli Posey, MD;  Location: [place];  Service: Gynecology;  Laterality: N/A;  . LAPAROSCOPIC CHOLECYSTECTOMY  2005  . LAPAROTOMY N/A 08/29/2014   Procedure: EXPLORATORY LAPAROTOMY WITH LIGATION OF BLEEDER;  Surgeon: Molli Posey, MD;  Location: [facility];  Service: Gynecology;  Laterality: N/A;  . POSTERIOR CERVICAL FUSION/FORAMINOTOMY N/A 01/25/2015   Procedure: POSTERIOR CERVICAL DECOMPRESSSION CERVICAL 7-THORACIC 1;  Surgeon: Phylliss Bob, MD;  Location: [place];  Service: Orthopedics;  Laterality: N/A;  Posterior cervical decompression, cervical 5-6, cervical 6-7, cervical  7-thoracic 1   . PUBOVAGINAL SLING N/A 08/29/2014   Procedure: Okey Dupre SINGLE INCISION SLING;  Surgeon: Molli Posey, MD;  Location: [facility];  Service: Gynecology;  Laterality: N/A;  . SALPINGOOPHORECTOMY Bilateral 08/29/2014   Procedure: SALPINGO OOPHORECTOMY;  Surgeon: Molli Posey, MD;  Location: [facility];  Service: Gynecology;  Laterality: Bilateral;  . SHOULDER ARTHROSCOPY W/ SUBACROMIAL DECOMPRESSION AND DISTAL CLAVICLE EXCISION Left 11-27-2011   W/  DEBRIDEMENT  . SHOULDER ARTHROSCOPY WITH BICEPSTENOTOMY Right 12/21/2014   Procedure: SHOULDER ARTHROSCOPY WITH BICEPSTENOTOMY;  Surgeon: Dorna Leitz, MD;  Location: [place];  Service: Orthopedics;  Laterality: Right;  . SHOULDER ARTHROSCOPY WITH DISTAL CLAVICLE RESECTION Right 12/21/2014   Procedure: SHOULDER ARTHROSCOPY WITH DISTAL CLAVICLE RESECTION;  Surgeon: Dorna Leitz, MD;  Location: [place];  Service: Orthopedics;  Laterality: Right;  . SHOULDER ARTHROSCOPY WITH OPEN ROTATOR CUFF REPAIR Right 07/12/2015   Procedure: SHOULDER ARTHROSCOPY WITH ACROMIOPLASTY AND OPEN ROTATOR CUFF REPAIR;  Surgeon: Dorna Leitz, MD;  Location: [place];  Service: Orthopedics;  Laterality: Right;  . SHOULDER ARTHROSCOPY WITH SUBACROMIAL DECOMPRESSION Left 11/10/2013  Procedure: SHOULDER ARTHROSCOPY WITH SUBACROMIAL DECOMPRESSION WITH ROTATOR CUFF REPAIR AND BICEPS TENODESIS;  Surgeon: Alta Corning, MD;  Location: [place];  Service: Orthopedics;  Laterality: Left;  . SHOULDER ARTHROSCOPY WITH SUBACROMIAL DECOMPRESSION Right 12/21/2014   Procedure: SHOULDER ARTHROSCOPY WITH SUBACROMIAL DECOMPRESSION;  Surgeon: Dorna Leitz, MD;  Location: [place];  Service: Orthopedics;  Laterality: Right;  . TONSILLECTOMY  age 79  . TRANSURETHRAL RESECTION OF BLADDER TUMOR N/A 01/11/2014   Procedure: TRANSURETHRAL RESECTION OF BLADDER TUMOR (TURBT);  Surgeon:  Festus Aloe, MD;  Location: [facility];  Service: Urology;  Laterality: N/A;  . TUBAL LIGATION  1980    Family History  Problem Relation Age of Onset  . Cancer Father        Lung and stomach   . Heart disease Father   . Hypertension Father   . Stroke Father   . Heart failure Paternal Grandfather   . Cancer Paternal Grandfather        colon cancer  . Ovarian cancer Mother 58  . Breast cancer Mother        dx in her late 27s-50s  . Thyroid cancer Mother   . Ovarian cancer Sister 75  . Cancer Brother        abdominal tumor  . Ovarian cancer Maternal Aunt   . Breast cancer Maternal Aunt   . Breast cancer Maternal Uncle   . Breast cancer Paternal Aunt   . Ovarian cancer Sister 52  . Ovarian cancer Sister 50  . Cancer Brother        abdominal tumor  . Breast cancer Paternal Aunt   . Breast cancer Paternal Aunt   . Breast cancer Paternal Aunt   . Breast cancer Cousin   . Breast cancer Cousin     Social history:  reports that she quit smoking about 6 years ago. Her smoking use included cigarettes. She has a 30.00 pack-year smoking history. She has never used smokeless tobacco. She reports that she does not drink alcohol and does not use drugs.  Medications:  Prior to Admission medications   Medication Sig Start Date End Date Taking? Authorizing Provider  albuterol (PROAIR HFA) 108 (90 Base) MCG/ACT inhaler  01/13/17  Yes [provider]  amphetamine-dextroamphetamine (ADDERALL) 20 MG tablet Take 1 tablet (20 mg total) daily by mouth. 02/14/17  Yes Vicie Mutters, PA-C  Calcium Carbonate (CALCIUM 500 PO) Take by mouth.   Yes [provider]  cholecalciferol (VITAMIN D) 1000 UNITS tablet Take 7,000 Units by mouth at bedtime.    Yes [provider]  clonazePAM (KLONOPIN) 2 MG tablet Take 1 tablet (2 mg total) by mouth at bedtime. for anxiety 01/13/17  Yes Liane Comber, NP  DULoxetine (CYMBALTA) 30 MG capsule Take 1 capsule (30 mg  total) by mouth daily. 01/13/17  Yes Unk Pinto, MD  gabapentin (NEURONTIN) 100 MG capsule Take 1 capsule (100 mg total) by mouth 3 (three) times daily as needed. Fibromyalgia pain Patient taking differently: Take 100 mg by mouth at bedtime. Fibromyalgia pain  01/13/17  Yes Unk Pinto, MD  levothyroxine (SYNTHROID) 75 MCG tablet 75 mcg Take as directed.   Yes [provider]  MAGNESIUM PO Take 450 mg by mouth daily.   Yes [provider]      Allergies  Allergen Reactions  . Sulfa Antibiotics Hives and Itching    ROS:  Out of a complete 14 system review of symptoms, the patient complains only  of the following symptoms, and all other reviewed systems are negative.  Back pain, right leg pain Neck pain Weight gain Walking difficulty  Blood pressure 140/74, pulse 61, height 5\' 4"  (1.626 m), weight 228 lb (103.4 kg).  Physical Exam  General: The patient is alert and cooperative at the time of the examination.  The patient is markedly obese.  Eyes: Pupils are equal, round, and reactive to light. Discs are flat bilaterally.  Neck: The neck is supple, no carotid bruits are noted.  Respiratory: The respiratory examination is clear.  Cardiovascular: The cardiovascular examination reveals a regular rate and rhythm, no obvious murmurs or rubs are noted.  Neuromuscular: The patient has full flexion of the low back, she has some tenderness over the right SI joint.  The patient has some discomfort with internal and external rotation of the right hip.  Skin: Extremities are without significant edema.  Neurologic Exam  Mental status: The patient is alert and oriented x 3 at the time of the examination. The patient has apparent normal recent and remote memory, with an apparently normal attention span and concentration ability.  Cranial nerves: Facial symmetry is present. There is good sensation of the face to pinprick and soft touch bilaterally. The strength of  the facial muscles and the muscles to head turning and shoulder shrug are normal bilaterally. Speech is well enunciated, no aphasia or dysarthria is noted. Extraocular movements are full. Visual fields are full. The tongue is midline, and the patient has symmetric elevation of the soft palate. No obvious hearing deficits are noted.  Motor: The motor testing reveals 5 over 5 strength of all 4 extremities.  There is fusion of the right ankle.  Good symmetric motor tone is noted throughout.  Sensory: Sensory testing is intact to pinprick, soft touch, vibration sensation, and position sense on all 4 extremities, with exception of some decreased pinprick station over the lateral and medial aspect of the right thigh. No evidence of extinction is noted.  Coordination: Cerebellar testing reveals good finger-nose-finger and heel-to-shin bilaterally.  Gait and station: Gait is associated with a limping quality on the right leg.  Tandem gait is normal. Romberg is negative. No drift is seen.  Reflexes: Deep tendon reflexes are symmetric, but are decreased bilaterally, particularly in the legs.  Toes are downgoing bilaterally.   MRI lumbar 06/29/18:  IMPRESSION: 1. Multilevel lumbar spondylosis as described above is overall similar to prior study.  * MRI scan images were reviewed online. I agree with the written report.    Assessment/Plan:  1.  Chronic right leg pain, sciatica  MRI studies of the lumbar spine do not show any structural abnormalities that would explain her pain.  Certainly, her discomfort could be associated with SI joint dysfunction but she has had a surgical fusion procedure on the right SI joint.  The patient has not responded to any injections.  The etiology of her pain is not clear.  We will check MRI of the thoracic spine, I will check another EMG and nerve conduction study.  She will have nerve conductions on both legs and EMG on the right leg.  If the studies are unrevealing, we  will consider a referral to a pain center, I would wonder if a radiofrequency ablation procedure can be done on the right SI joint if she has already had a prior surgical fusion procedure.  Jill Alexanders MD 10/13/2019 9:43 AM  [facility] [address] [place], [place] [address]  Phone [phone] Fax [phone]  

## 2019-10-13 NOTE — Telephone Encounter (Signed)
health team order sent to GI. No auth they will reach out to the patient to schedule.  

## 2019-10-17 ENCOUNTER — Other Ambulatory Visit: Payer: Self-pay

## 2019-10-17 ENCOUNTER — Ambulatory Visit
Admission: RE | Admit: 2019-10-17 | Discharge: 2019-10-17 | Disposition: A | Discharge status: Home / Self Care | Payer: BC Managed Care – PPO | Source: Ambulatory Visit | Attending: Neurology | Admitting: Neurology

## 2019-10-17 DIAGNOSIS — M5431 Sciatica, right side: Secondary | ICD-10-CM | POA: Diagnosis not present

## 2019-10-18 ENCOUNTER — Telehealth: Payer: Self-pay | Admitting: Neurology

## 2019-10-18 NOTE — Telephone Encounter (Signed)
I called the patient.  The MRI of the thoracic spine is unrevealing, no evidence of spinal cord compression or spinal cord abnormalities that would be causing discomfort.  We will do EMG and nerve conduction study, if this is normal, I will refer the patient to the [facility] pain center.  We will consider the possibility of a spinal stimulator or if they can do it, radiofrequency ablation of the right SI joint.   MRI thoracic 10/17/19:  IMPRESSION: This MRI of the thoracic spine without contrast shows the following: 1.   There are disc protrusions to the left at T8-T9 and to the right at T9-T10.  There is no nerve root compression or spinal stenosis. 2.   The spinal cord appears normal.

## 2019-11-22 ENCOUNTER — Encounter: Payer: Self-pay | Admitting: Neurology

## 2019-11-22 ENCOUNTER — Ambulatory Visit (INDEPENDENT_AMBULATORY_CARE_PROVIDER_SITE_OTHER): Payer: PPO | Admitting: Neurology

## 2019-11-22 ENCOUNTER — Ambulatory Visit: Payer: PPO | Admitting: Neurology

## 2019-11-22 DIAGNOSIS — Z0289 Encounter for other administrative examinations: Secondary | ICD-10-CM

## 2019-11-22 DIAGNOSIS — M5440 Lumbago with sciatica, unspecified side: Secondary | ICD-10-CM

## 2019-11-22 DIAGNOSIS — M5431 Sciatica, right side: Secondary | ICD-10-CM

## 2019-11-22 NOTE — Progress Notes (Signed)
Please refer to EMG and nerve conduction procedure note.  

## 2019-11-22 NOTE — Progress Notes (Addendum)
The patient comes in for EMG and nerve conduction study evaluation.  The study was unremarkable, no evidence of a neuropathy or lumbosacral radiculopathy is seen.  The etiology of the right leg numbness and discomfort is not clear, could possibly be still secondary to SI joint dysfunction.  This seemed to worsen after SI joint fusion.  The patient could benefit from a pain center referral.     [place]    Nerve / Sites Muscle Latency Ref. Amplitude Ref. Rel Amp Segments Distance Velocity Ref. Area    ms ms mV mV %  cm m/s m/s mVms  L Peroneal - EDB     Ankle EDB 4.0 ?6.5 8.9 ?2.0 100 Ankle - EDB 9   23.9     Fib head EDB 9.2  7.7  86.8 Fib head - Ankle 26 50 ?44 23.0     Pop fossa EDB 11.2  7.4  96.6 Pop fossa - Fib head 10 49 ?44 23.4         Pop fossa - Ankle      R Peroneal - EDB     Ankle EDB 4.0 ?6.5 7.8 ?2.0 100 Ankle - EDB 9   24.2     Fib head EDB 8.8  7.1  91.8 Fib head - Ankle 24 50 ?44 22.5     Pop fossa EDB 10.9  7.0  97.6 Pop fossa - Fib head 10 47 ?44 22.3         Pop fossa - Ankle      L Tibial - AH     Ankle AH 3.9 ?5.8 4.5 ?4.0 100 Ankle - AH 9   11.6     Pop fossa AH 11.6  5.1  113 Pop fossa - Ankle 37 48 ?41 12.5  R Tibial - AH     Ankle AH 3.9 ?5.8 6.0 ?4.0 100 Ankle - AH 9   12.0     Pop fossa AH 12.3  4.9  82.7 Pop fossa - Ankle 36 43 ?41 9.3             SNC    Nerve / Sites Rec. Site Peak Lat Ref.  Amp Ref. Segments Distance    ms ms V V  cm  L Sural - Ankle (Calf)     Calf Ankle 4.3 ?4.4 8 ?6 Calf - Ankle 14  R Sural - Ankle (Calf)     Calf Ankle 3.7 ?4.4 6 ?6 Calf - Ankle 14  L Superficial peroneal - Ankle     Lat leg Ankle 4.0 ?4.4 8 ?6 Lat leg - Ankle 14  R Superficial peroneal - Ankle     Lat leg Ankle 4.0 ?4.4 9 ?6 Lat leg - Ankle 14             F  Wave    Nerve F Lat Ref.   ms ms  L Tibial - AH 44.8 ?56.0  R Tibial - AH 49.4 ?56.0

## 2019-11-22 NOTE — Procedures (Signed)
     HISTORY:  [name] is a 59 year old patient with a history of chronic right leg and low back pain.  She has had right SI joint fusion in the past that has worsened her discomfort and numbness.  She is being evaluated for her ongoing pain and sensory changes.  NERVE CONDUCTION STUDIES:  Nerve conduction studies were performed on both lower extremities. The distal motor latencies and motor amplitudes for the peroneal and posterior tibial nerves were within normal limits. The nerve conduction velocities for these nerves were also normal. The sensory latencies for the peroneal and sural nerves were within normal limits. The F wave latencies for the posterior tibial nerves were within normal limits.   EMG STUDIES:  EMG study was performed on the right lower extremity:  The tibialis anterior muscle reveals 2 to 4K motor units with full recruitment. No fibrillations or positive waves were seen. The peroneus tertius muscle reveals 2 to 4K motor units with full recruitment. No fibrillations or positive waves were seen. The medial gastrocnemius muscle reveals 1 to 3K motor units with full recruitment. No fibrillations or positive waves were seen. The vastus lateralis muscle reveals 2 to 4K motor units with full recruitment. No fibrillations or positive waves were seen. The iliopsoas muscle reveals 2 to 4K motor units with full recruitment. No fibrillations or positive waves were seen. The biceps femoris muscle (long head) reveals 2 to 4K motor units with full recruitment. No fibrillations or positive waves were seen. The lumbosacral paraspinal muscles were tested at 3 levels, and revealed no abnormalities of insertional activity at all 3 levels tested. There was good relaxation.   IMPRESSION:  Nerve conduction studies done on both lower extremities were unremarkable, no evidence of a neuropathy is seen.  EMG evaluation of the right lower extremity was unremarkable without evidence of an  overlying lumbosacral radiculopathy.  Jill Alexanders MD 11/22/2019 1:35 PM  [facility] [address] [place], [place] [address]  Phone [phone] Fax [phone]

## 2019-12-14 DIAGNOSIS — J449 Chronic obstructive pulmonary disease, unspecified: Secondary | ICD-10-CM | POA: Diagnosis not present

## 2019-12-14 DIAGNOSIS — Z20822 Contact with and (suspected) exposure to covid-19: Secondary | ICD-10-CM | POA: Diagnosis not present

## 2019-12-14 DIAGNOSIS — R05 Cough: Secondary | ICD-10-CM | POA: Diagnosis not present

## 2019-12-14 DIAGNOSIS — B349 Viral infection, unspecified: Secondary | ICD-10-CM | POA: Diagnosis not present

## 2020-01-18 DIAGNOSIS — B029 Zoster without complications: Secondary | ICD-10-CM | POA: Diagnosis not present

## 2020-01-24 DIAGNOSIS — Z8551 Personal history of malignant neoplasm of bladder: Secondary | ICD-10-CM | POA: Diagnosis not present

## 2020-01-24 DIAGNOSIS — F321 Major depressive disorder, single episode, moderate: Secondary | ICD-10-CM | POA: Diagnosis not present

## 2020-01-24 DIAGNOSIS — G2581 Restless legs syndrome: Secondary | ICD-10-CM | POA: Diagnosis not present

## 2020-01-24 DIAGNOSIS — E559 Vitamin D deficiency, unspecified: Secondary | ICD-10-CM | POA: Diagnosis not present

## 2020-01-24 DIAGNOSIS — M797 Fibromyalgia: Secondary | ICD-10-CM | POA: Diagnosis not present

## 2020-01-24 DIAGNOSIS — E669 Obesity, unspecified: Secondary | ICD-10-CM | POA: Diagnosis not present

## 2020-01-24 DIAGNOSIS — B029 Zoster without complications: Secondary | ICD-10-CM | POA: Diagnosis not present

## 2020-01-24 DIAGNOSIS — Z8639 Personal history of other endocrine, nutritional and metabolic disease: Secondary | ICD-10-CM | POA: Diagnosis not present

## 2020-01-24 DIAGNOSIS — F419 Anxiety disorder, unspecified: Secondary | ICD-10-CM | POA: Diagnosis not present

## 2020-01-24 DIAGNOSIS — F988 Other specified behavioral and emotional disorders with onset usually occurring in childhood and adolescence: Secondary | ICD-10-CM | POA: Diagnosis not present

## 2020-01-24 DIAGNOSIS — J410 Simple chronic bronchitis: Secondary | ICD-10-CM | POA: Diagnosis not present

## 2020-03-09 DIAGNOSIS — Z23 Encounter for immunization: Secondary | ICD-10-CM | POA: Diagnosis not present

## 2020-03-09 DIAGNOSIS — M797 Fibromyalgia: Secondary | ICD-10-CM | POA: Diagnosis not present

## 2020-03-09 DIAGNOSIS — G2581 Restless legs syndrome: Secondary | ICD-10-CM | POA: Diagnosis not present

## 2020-03-09 DIAGNOSIS — J410 Simple chronic bronchitis: Secondary | ICD-10-CM | POA: Diagnosis not present

## 2020-03-09 DIAGNOSIS — F988 Other specified behavioral and emotional disorders with onset usually occurring in childhood and adolescence: Secondary | ICD-10-CM | POA: Diagnosis not present

## 2020-03-09 DIAGNOSIS — F321 Major depressive disorder, single episode, moderate: Secondary | ICD-10-CM | POA: Diagnosis not present

## 2020-03-09 DIAGNOSIS — F419 Anxiety disorder, unspecified: Secondary | ICD-10-CM | POA: Diagnosis not present

## 2020-04-28 DIAGNOSIS — F419 Anxiety disorder, unspecified: Secondary | ICD-10-CM | POA: Diagnosis not present

## 2020-04-28 DIAGNOSIS — F321 Major depressive disorder, single episode, moderate: Secondary | ICD-10-CM | POA: Diagnosis not present

## 2020-06-09 DIAGNOSIS — R310 Gross hematuria: Secondary | ICD-10-CM | POA: Diagnosis not present

## 2020-06-09 DIAGNOSIS — Z8551 Personal history of malignant neoplasm of bladder: Secondary | ICD-10-CM | POA: Diagnosis not present

## 2020-06-09 DIAGNOSIS — M5137 Other intervertebral disc degeneration, lumbosacral region: Secondary | ICD-10-CM | POA: Diagnosis not present

## 2020-06-09 DIAGNOSIS — D3500 Benign neoplasm of unspecified adrenal gland: Secondary | ICD-10-CM | POA: Diagnosis not present

## 2020-06-20 DIAGNOSIS — R6889 Other general symptoms and signs: Secondary | ICD-10-CM | POA: Diagnosis not present

## 2020-06-20 DIAGNOSIS — F321 Major depressive disorder, single episode, moderate: Secondary | ICD-10-CM | POA: Diagnosis not present

## 2020-06-20 DIAGNOSIS — F419 Anxiety disorder, unspecified: Secondary | ICD-10-CM | POA: Diagnosis not present

## 2020-07-07 DIAGNOSIS — J209 Acute bronchitis, unspecified: Secondary | ICD-10-CM | POA: Diagnosis not present

## 2020-07-07 DIAGNOSIS — R051 Acute cough: Secondary | ICD-10-CM | POA: Diagnosis not present

## 2020-07-26 DIAGNOSIS — J410 Simple chronic bronchitis: Secondary | ICD-10-CM | POA: Diagnosis not present

## 2020-07-26 DIAGNOSIS — M797 Fibromyalgia: Secondary | ICD-10-CM | POA: Diagnosis not present

## 2020-07-26 DIAGNOSIS — R946 Abnormal results of thyroid function studies: Secondary | ICD-10-CM | POA: Diagnosis not present

## 2020-07-26 DIAGNOSIS — H43393 Other vitreous opacities, bilateral: Secondary | ICD-10-CM | POA: Diagnosis not present

## 2020-07-26 DIAGNOSIS — F321 Major depressive disorder, single episode, moderate: Secondary | ICD-10-CM | POA: Diagnosis not present

## 2020-07-26 DIAGNOSIS — F988 Other specified behavioral and emotional disorders with onset usually occurring in childhood and adolescence: Secondary | ICD-10-CM | POA: Diagnosis not present

## 2020-07-26 DIAGNOSIS — E669 Obesity, unspecified: Secondary | ICD-10-CM | POA: Diagnosis not present

## 2020-07-26 DIAGNOSIS — E559 Vitamin D deficiency, unspecified: Secondary | ICD-10-CM | POA: Diagnosis not present

## 2020-07-26 DIAGNOSIS — H40033 Anatomical narrow angle, bilateral: Secondary | ICD-10-CM | POA: Diagnosis not present

## 2020-07-26 DIAGNOSIS — G2581 Restless legs syndrome: Secondary | ICD-10-CM | POA: Diagnosis not present

## 2020-07-26 DIAGNOSIS — Z8639 Personal history of other endocrine, nutritional and metabolic disease: Secondary | ICD-10-CM | POA: Diagnosis not present

## 2020-07-26 DIAGNOSIS — F419 Anxiety disorder, unspecified: Secondary | ICD-10-CM | POA: Diagnosis not present

## 2020-08-23 DIAGNOSIS — H04123 Dry eye syndrome of bilateral lacrimal glands: Secondary | ICD-10-CM | POA: Diagnosis not present

## 2020-08-29 DIAGNOSIS — M797 Fibromyalgia: Secondary | ICD-10-CM | POA: Diagnosis not present

## 2020-08-29 DIAGNOSIS — F419 Anxiety disorder, unspecified: Secondary | ICD-10-CM | POA: Diagnosis not present

## 2020-08-29 DIAGNOSIS — F321 Major depressive disorder, single episode, moderate: Secondary | ICD-10-CM | POA: Diagnosis not present

## 2020-08-30 DIAGNOSIS — Z1231 Encounter for screening mammogram for malignant neoplasm of breast: Secondary | ICD-10-CM | POA: Diagnosis not present

## 2020-10-16 ENCOUNTER — Ambulatory Visit: Payer: PPO | Admitting: Orthopaedic Surgery

## 2020-10-17 DIAGNOSIS — M1612 Unilateral primary osteoarthritis, left hip: Secondary | ICD-10-CM | POA: Diagnosis not present

## 2020-11-08 ENCOUNTER — Ambulatory Visit
Admission: RE | Admit: 2020-11-08 | Discharge: 2020-11-08 | Disposition: A | Discharge status: Home / Self Care | Payer: PPO | Source: Ambulatory Visit | Attending: Family Medicine | Admitting: Family Medicine

## 2020-11-08 ENCOUNTER — Other Ambulatory Visit: Payer: Self-pay | Admitting: Family Medicine

## 2020-11-08 DIAGNOSIS — J42 Unspecified chronic bronchitis: Secondary | ICD-10-CM

## 2020-11-08 DIAGNOSIS — F419 Anxiety disorder, unspecified: Secondary | ICD-10-CM | POA: Diagnosis not present

## 2020-11-08 DIAGNOSIS — M797 Fibromyalgia: Secondary | ICD-10-CM | POA: Diagnosis not present

## 2020-11-08 DIAGNOSIS — Z01818 Encounter for other preprocedural examination: Secondary | ICD-10-CM | POA: Diagnosis not present

## 2020-11-08 DIAGNOSIS — M25552 Pain in left hip: Secondary | ICD-10-CM | POA: Diagnosis not present

## 2020-11-08 DIAGNOSIS — F321 Major depressive disorder, single episode, moderate: Secondary | ICD-10-CM | POA: Diagnosis not present

## 2020-11-08 DIAGNOSIS — J449 Chronic obstructive pulmonary disease, unspecified: Secondary | ICD-10-CM | POA: Diagnosis not present

## 2020-11-13 ENCOUNTER — Ambulatory Visit: Payer: Self-pay | Admitting: Student

## 2020-11-20 NOTE — Patient Instructions (Signed)
DUE TO COVID-19 ONLY ONE VISITOR IS ALLOWED TO COME WITH YOU AND STAY IN THE WAITING ROOM ONLY DURING PRE OP AND PROCEDURE.   **NO VISITORS ARE ALLOWED IN THE SHORT STAY AREA OR RECOVERY ROOM!!**  IF YOU WILL BE ADMITTED INTO THE HOSPITAL YOU ARE ALLOWED ONLY TWO SUPPORT PEOPLE DURING VISITATION HOURS ONLY (10AM -8PM)   The support person(s) may change daily. The support person(s) must pass our screening, gel in and out, and wear a mask at all times, including in the patient's room. Patients must also wear a mask when staff or their support person are in the room.  No visitors under the age of 22. Any visitor under the age of 32 must be accompanied by an adult.    COVID SWAB TESTING MUST BE COMPLETED ON:  Tuesday, 11-28-20 between the hours of 8 and 3  **MUST PRESENT COMPLETED FORM AT TESTING SITE**    [place] (backside of the building)  You are not required to quarantine, however you are required to wear a well-fitted mask when you are out and around people not in your household.  Hand Hygiene often Do NOT share personal items Notify your provider if you are in close contact with someone who has COVID or you develop fever 100.4 or greater, new onset of sneezing, cough, sore throat, shortness of breath or body aches.        Your procedure is scheduled on:  Thursday, 11-30-20   Report to [facility] Main  Entrance   Report to Short Stay at 5:15 AM   [facility])   Call this number if you have problems the morning of surgery [phone]   Do not eat food :After Midnight.   May have liquids until 4:30 AM  day of surgery  CLEAR LIQUID DIET  Foods Allowed                                                                     Foods Excluded  Water, Black Coffee and tea, regular and decaf              liquids that you cannot  Plain Jell-O in any flavor  (No red)                                    see through such as: Fruit ices (not with fruit pulp)                                       milk, soups, orange juice              Iced Popsicles (No red)                                      All solid food  Apple juices Sports drinks like Gatorade (No red) Lightly seasoned clear broth or consume(fat free) Sugar, honey syrup     Complete one G2 drink the morning of surgery at 4:30 AM  the day of surgery.        The day of surgery:  Drink ONE (1) G2  the morning of surgery. Drink in one sitting. Do not sip.  This drink was given to you during your hospital  pre-op appointment visit. Nothing else to drink after completing the G2.          If you have questions, please contact your surgeon's office.     Oral Hygiene is also important to reduce your risk of infection.                                    Remember - BRUSH YOUR TEETH THE MORNING OF SURGERY WITH YOUR REGULAR TOOTHPASTE   Do NOT smoke after Midnight   Take these medicines the morning of surgery with A SIP OF WATER: Duloxetine, Levothyroxine                               You may not have any metal on your body including hair pins, jewelry, and body piercing             Do not wear make-up, lotions, powders, perfumes/ or deodorant  Do not wear nail polish including gel and S&S, artificial/acrylic nails, or any other type of covering on natural nails including finger and toenails. If you have artificial nails, gel coating, etc. that needs to be removed by a nail salon please have this removed prior to surgery or surgery may need to be canceled/ delayed if the surgeon/ anesthesia feels like they are unable to be safely monitored.   Do not shave  48 hours prior to surgery.         Do not bring valuables to the hospital. [place].   Contacts, dentures or bridgework may not be worn into surgery.     Patients discharged the day of surgery will not be allowed to drive home.  Special Instructions: Bring a  copy of your healthcare power of attorney and living will documents         the day of surgery if you haven't scanned them in before.  Please read over the following fact sheets you were given: IF YOU HAVE QUESTIONS ABOUT YOUR PRE OP INSTRUCTIONS PLEASE CALL [phone]   [facility] - Preparing for Surgery Before surgery, you can play an important role.  Because skin is not sterile, your skin needs to be as free of germs as possible.  You can reduce the number of germs on your skin by washing with CHG (chlorahexidine gluconate) soap before surgery.  CHG is an antiseptic cleaner which kills germs and bonds with the skin to continue killing germs even after washing. Please DO NOT use if you have an allergy to CHG or antibacterial soaps.  If your skin becomes reddened/irritated stop using the CHG and inform your nurse when you arrive at Short Stay. Do not shave (including legs and underarms) for at least 48 hours prior to the first CHG shower.  You may shave your face/neck.  Please follow these instructions carefully:  1.  Shower with CHG Soap the night  before surgery and the  morning of surgery.  2.  If you choose to wash your hair, wash your hair first as usual with your normal  shampoo.  3.  After you shampoo, rinse your hair and body thoroughly to remove the shampoo.                             4.  Use CHG as you would any other liquid soap.  You can apply chg directly to the skin and wash.  Gently with a scrungie or clean washcloth.  5.  Apply the CHG Soap to your body ONLY FROM THE NECK DOWN.   Do   not use on face/ open                           Wound or open sores. Avoid contact with eyes, ears mouth and   genitals (private parts).                       Wash face,  Genitals (private parts) with your normal soap.             6.  Wash thoroughly, paying special attention to the area where your    surgery  will be performed.  7.  Thoroughly rinse your body with warm water from the neck  down.  8.  DO NOT shower/wash with your normal soap after using and rinsing off the CHG Soap.                9.  Pat yourself dry with a clean towel.            10.  Wear clean pajamas.            11.  Place clean sheets on your bed the night of your first shower and do not  sleep with pets. Day of Surgery : Do not apply any lotions/deodorants the morning of surgery.  Please wear clean clothes to the hospital/surgery center.  FAILURE TO FOLLOW THESE INSTRUCTIONS MAY RESULT IN THE CANCELLATION OF YOUR SURGERY  PATIENT SIGNATURE_________________________________  NURSE SIGNATURE__________________________________  ________________________________________________________________________   [name]  An incentive spirometer is a tool that can help keep your lungs clear and active. This tool measures how well you are filling your lungs with each breath. Taking long deep breaths may help reverse or decrease the chance of developing breathing (pulmonary) problems (especially infection) following: A long period of time when you are unable to move or be active. BEFORE THE PROCEDURE  If the spirometer includes an indicator to show your best effort, your nurse or respiratory therapist will set it to a desired goal. If possible, sit up straight or lean slightly forward. Try not to slouch. Hold the incentive spirometer in an upright position. INSTRUCTIONS FOR USE  Sit on the edge of your bed if possible, or sit up as far as you can in bed or on a chair. Hold the incentive spirometer in an upright position. Breathe out normally. Place the mouthpiece in your mouth and seal your lips tightly around it. Breathe in slowly and as deeply as possible, raising the piston or the ball toward the top of the column. Hold your breath for 3-5 seconds or for as long as possible. Allow the piston or ball to fall to the bottom of the column. Remove the mouthpiece from your mouth and  breathe out  normally. Rest for a few seconds and repeat Steps 1 through 7 at least 10 times every 1-2 hours when you are awake. Take your time and take a few normal breaths between deep breaths. The spirometer may include an indicator to show your best effort. Use the indicator as a goal to work toward during each repetition. After each set of 10 deep breaths, practice coughing to be sure your lungs are clear. If you have an incision (the cut made at the time of surgery), support your incision when coughing by placing a pillow or rolled up towels firmly against it. Once you are able to get out of bed, walk around indoors and cough well. You may stop using the incentive spirometer when instructed by your caregiver.  RISKS AND COMPLICATIONS Take your time so you do not get dizzy or light-headed. If you are in pain, you may need to take or ask for pain medication before doing incentive spirometry. It is harder to take a deep breath if you are having pain. AFTER USE Rest and breathe slowly and easily. It can be helpful to keep track of a log of your progress. Your caregiver can provide you with a simple table to help with this. If you are using the spirometer at home, follow these instructions: [place] IF:  You are having difficultly using the spirometer. You have trouble using the spirometer as often as instructed. Your pain medication is not giving enough relief while using the spirometer. You develop fever of 100.5 F (38.1 C) or higher. SEEK IMMEDIATE MEDICAL CARE IF:  You cough up bloody sputum that had not been present before. You develop fever of 102 F (38.9 C) or greater. You develop worsening pain at or near the incision site. MAKE SURE YOU:  Understand these instructions. Will watch your condition. Will get help right away if you are not doing well or get worse. Document Released: 08/05/2006 Document Revised: 06/17/2011 Document Reviewed: 10/06/2006 Exit[employer] Patient Information  2014 [employer], [place].   ________________________________________________________________________  WHAT IS A BLOOD TRANSFUSION? Blood Transfusion Information  A transfusion is the replacement of blood or some of its parts. Blood is made up of multiple cells which provide different functions. Red blood cells carry oxygen and are used for blood loss replacement. White blood cells fight against infection. Platelets control bleeding. Plasma helps clot blood. Other blood products are available for specialized needs, such as hemophilia or other clotting disorders. BEFORE THE TRANSFUSION  Who gives blood for transfusions?  Healthy volunteers who are fully evaluated to make sure their blood is safe. This is blood bank blood. Transfusion therapy is the safest it has ever been in the practice of medicine. Before blood is taken from a donor, a complete history is taken to make sure that person has no history of diseases nor engages in risky social behavior (examples are intravenous drug use or sexual activity with multiple partners). The donor's travel history is screened to minimize risk of transmitting infections, such as malaria. The donated blood is tested for signs of infectious diseases, such as HIV and hepatitis. The blood is then tested to be sure it is compatible with you in order to minimize the chance of a transfusion reaction. If you or a relative donates blood, this is often done in anticipation of surgery and is not appropriate for emergency situations. It takes many days to process the donated blood. RISKS AND COMPLICATIONS Although transfusion therapy is very safe and saves many  lives, the main dangers of transfusion include:  Getting an infectious disease. Developing a transfusion reaction. This is an allergic reaction to something in the blood you were given. Every precaution is taken to prevent this. The decision to have a blood transfusion has been considered carefully by your caregiver  before blood is given. Blood is not given unless the benefits outweigh the risks. AFTER THE TRANSFUSION Right after receiving a blood transfusion, you will usually feel much better and more energetic. This is especially true if your red blood cells have gotten low (anemic). The transfusion raises the level of the red blood cells which carry oxygen, and this usually causes an energy increase. The nurse administering the transfusion will monitor you carefully for complications. HOME CARE INSTRUCTIONS  No special instructions are needed after a transfusion. You may find your energy is better. Speak with your caregiver about any limitations on activity for underlying diseases you may have. SEEK MEDICAL CARE IF:  Your condition is not improving after your transfusion. You develop redness or irritation at the intravenous (IV) site. SEEK IMMEDIATE MEDICAL CARE IF:  Any of the following symptoms occur over the next 12 hours: Shaking chills. You have a temperature by mouth above 102 F (38.9 C), not controlled by medicine. Chest, back, or muscle pain. People around you feel you are not acting correctly or are confused. Shortness of breath or difficulty breathing. Dizziness and fainting. You get a rash or develop hives. You have a decrease in urine output. Your urine turns a dark color or changes to pink, red, or brown. Any of the following symptoms occur over the next 10 days: You have a temperature by mouth above 102 F (38.9 C), not controlled by medicine. Shortness of breath. Weakness after normal activity. The white part of the eye turns yellow (jaundice). You have a decrease in the amount of urine or are urinating less often. Your urine turns a dark color or changes to pink, red, or brown. Document Released: 03/22/2000 Document Revised: 06/17/2011 Document Reviewed: 11/09/2007 [facility] Patient Information 2014 [place],  [place].  _______________________________________________________________________

## 2020-11-20 NOTE — Progress Notes (Signed)
COVID swab appointment:  COVID Vaccine Completed: Date COVID Vaccine completed: Has received booster: COVID vaccine manufacturer: [place]   Date of COVID positive in last 90 days:  PCP - Rodena Piety, MD Cardiologist -   Chest x-ray - 11-08-20 Epic EKG -  Stress Test -  ECHO -  Cardiac Cath -  Pacemaker/ICD device last checked: Spinal Cord Stimulator:  Sleep Study -  CPAP -   Fasting Blood Sugar -  Checks Blood Sugar _____ times a day  Blood Thinner Instructions: Aspirin Instructions: Last Dose:  Activity level:  Can go up a flight of stairs and perform activities of daily living without stopping and without symptoms of chest pain or shortness of breath.   Able to exercise without symptoms  Unable to go up a flight of stairs without symptoms of      Anesthesia review: COPD, HTN  Patient denies shortness of breath, fever, cough and chest pain at PAT appointment   Patient verbalized understanding of instructions that were given to them at the PAT appointment. Patient was also instructed that they will need to review over the PAT instructions again at home before surgery.

## 2020-11-21 ENCOUNTER — Encounter (HOSPITAL_COMMUNITY): Payer: Self-pay

## 2020-11-21 ENCOUNTER — Encounter (HOSPITAL_COMMUNITY)
Admission: RE | Admit: 2020-11-21 | Discharge: 2020-11-21 | Disposition: A | Discharge status: Home / Self Care | Payer: PPO | Source: Ambulatory Visit | Attending: Orthopedic Surgery | Admitting: Orthopedic Surgery

## 2020-11-21 ENCOUNTER — Ambulatory Visit: Payer: Self-pay | Admitting: Student

## 2020-11-21 ENCOUNTER — Other Ambulatory Visit: Payer: Self-pay

## 2020-11-21 DIAGNOSIS — Z01818 Encounter for other preprocedural examination: Secondary | ICD-10-CM | POA: Diagnosis not present

## 2020-11-21 HISTORY — DX: Prediabetes: R73.03

## 2020-11-21 HISTORY — DX: Unspecified osteoarthritis, unspecified site: M19.90

## 2020-11-21 LAB — SURGICAL PCR SCREEN
MRSA, PCR: NEGATIVE
Staphylococcus aureus: NEGATIVE

## 2020-11-21 NOTE — H&P (Signed)
TOTAL HIP ADMISSION H&P  Patient is admitted for left total hip arthroplasty.  Subjective:  Chief Complaint: left hip pain  HPI: [name], 60 y.o. female, has a history of pain and functional disability in the left hip(s) due to arthritis and patient has failed non-surgical conservative treatments for greater than 12 weeks to include NSAID's and/or analgesics and activity modification.  Onset of symptoms was gradual starting 3 years ago with gradually worsening course since that time.The patient noted no past surgery on the left hip(s).  Patient currently rates pain in the left hip at 8 out of 10 with activity. Patient has worsening of pain with activity and weight bearing, pain that interfers with activities of daily living, and pain with passive range of motion. Patient has evidence of subchondral cysts, subchondral sclerosis, and joint space narrowing by imaging studies. This condition presents safety issues increasing the risk of falls. There is no current active infection.  Patient Active Problem List   Diagnosis Date Noted   Sciatica of right side 10/13/2019   Back pain 11/24/2015   Radicular pain 08/02/2015   Peripheral sensory neuropathy 05/10/2015   Obesity 07/05/2014   Vitamin D deficiency 07/05/2014   Thoracic aorta atherosclerosis ([place]) 07/05/2014   Bladder cancer (HCC)    Hypothyroidism    Restless leg syndrome    Fibromyalgia    Labile hypertension    Borderline hyperlipidemia    COPD (chronic obstructive pulmonary disease) (HCC)    GERD (gastroesophageal reflux disease)    Prediabetes    Bipolar 2 disorder, major depressive episode ([place])    Past Medical History:  Diagnosis Date   ADD (attention deficit disorder)    Anxiety    Arthritis    Bladder neoplasm    Cancer ([place]) 2015   Bladder cancer   COPD (chronic obstructive pulmonary disease) (HCC)    Depression    Fibroids    Fibromyalgia    H/O bronchitis    treated within the last week w steroids,  antibx tessalon pearls.    H/O sebaceous cyst 07/18/2014   left breast    Hemorrhoid    History of melanoma excision    leg   Hyperlipidemia    Hypertension    normal now, only took medications for 1 month- thinks it was from pain.   Hypothyroidism    Iron deficiency anemia    PONV (postoperative nausea and vomiting)    used a scop patch last surg-did well   Pre-diabetes    Prediabetes    Restless leg syndrome    Sciatica of right side 10/13/2019   Shortness of breath dyspnea    with exertion   Shoulder impingement    right   SUI (stress urinary incontinence, female)    Wears glasses     Past Surgical History:  Procedure Laterality Date   ABDOMINAL HYSTERECTOMY     ANKLE FUSION Right 2011   ANTERIOR CERVICAL DECOMP/DISCECTOMY FUSION N/A 09/01/2013   Procedure: Anterior cervical decompression fusion, cervical 5-6 with instrumentation and allograft;  Surgeon: Sinclair Ship, MD;  Location: [place];  Service: Orthopedics;  Laterality: N/A;  Anterior cervical decompression fusion, cervical 5-6 with instrumentation and allograft   ANTERIOR CERVICAL DECOMP/DISCECTOMY FUSION N/A 08/02/2015   Procedure: ANTERIOR CERVICAL DECOMPRESSION FUSION CERVICAL 6 - THORACIC 1 WITH INSTRUMENTATION AND ALLOGRAFT;  Surgeon: Phylliss Bob, MD;  Location: [place];  Service: Orthopedics;  Laterality: N/A;  ANTERIOR CERVICAL DECOMPRESSION FUSION, CERVICAL 6-7,CERVICAL 7-THORACIC 7 WITH INSTRUMENTATION AND ALLOGRAFT  CATARACT EXTRACTION W/ INTRAOCULAR LENS IMPLANT Bilateral    CLOSED REDUCTION AND PINNING LEFT FIFTH  PROXIMAL PHALANGEAL FX  10-09-2005   CYSTOSCOPY W/ RETROGRADES Left 01/11/2014   Procedure: CYSTOSCOPY WITH RETROGRADE PYELOGRAM;  Surgeon: Festus Aloe, MD;  Location: [facility];  Service: Urology;  Laterality: Left;   CYSTOSCOPY WITH STENT PLACEMENT Left 01/11/2014   Procedure: CYSTOSCOPY WITH STENT PLACEMENT;  Surgeon: Festus Aloe, MD;  Location: [facility];  Service: Urology;  Laterality: Left;   D & C HYSTEROSCOPY WITH NOVASURE ENDOMETRIAL ABLATION  07-16-2006   KNEE ARTHROSCOPY WITH MEDIAL MENISECTOMY Right 05/22/2012   Procedure: KNEE ARTHROSCOPY WITH MEDIAL MENISECTOMY and Medial Plica excision;  Surgeon: Alta Corning, MD;  Location: [place];  Service: Orthopedics;  Laterality: Right;   LAPAROSCOPIC ASSISTED VAGINAL HYSTERECTOMY N/A 08/29/2014   Procedure: LAPAROSCOPIC ASSISTED VAGINAL HYSTERECTOMY;  Surgeon: Molli Posey, MD;  Location: [place];  Service: Gynecology;  Laterality: N/A;   LAPAROSCOPIC CHOLECYSTECTOMY  2005   LAPAROTOMY N/A 08/29/2014   Procedure: EXPLORATORY LAPAROTOMY WITH LIGATION OF BLEEDER;  Surgeon: Molli Posey, MD;  Location: [facility];  Service: Gynecology;  Laterality: N/A;   POSTERIOR CERVICAL FUSION/FORAMINOTOMY N/A 01/25/2015   Procedure: POSTERIOR CERVICAL DECOMPRESSSION CERVICAL 7-THORACIC 1;  Surgeon: Phylliss Bob, MD;  Location: [place];  Service: Orthopedics;  Laterality: N/A;  Posterior cervical decompression, cervical 5-6, cervical 6-7, cervical 7-thoracic 1    PUBOVAGINAL SLING N/A 08/29/2014   Procedure: SOLYX SINGLE INCISION SLING;  Surgeon: Molli Posey, MD;  Location: [place];  Service: Gynecology;  Laterality: N/A;   SALPINGOOPHORECTOMY Bilateral 08/29/2014   Procedure: SALPINGO OOPHORECTOMY;  Surgeon: Molli Posey, MD;  Location: [facility];  Service: Gynecology;  Laterality: Bilateral;   SHOULDER ARTHROSCOPY W/ SUBACROMIAL DECOMPRESSION AND DISTAL CLAVICLE EXCISION Left 11-27-2011   W/  DEBRIDEMENT   SHOULDER ARTHROSCOPY WITH BICEPSTENOTOMY Right 12/21/2014   Procedure: SHOULDER ARTHROSCOPY WITH BICEPSTENOTOMY;  Surgeon: Dorna Leitz, MD;  Location: [place];  Service: Orthopedics;  Laterality: Right;   SHOULDER ARTHROSCOPY WITH DISTAL CLAVICLE RESECTION Right 12/21/2014   Procedure: SHOULDER  ARTHROSCOPY WITH DISTAL CLAVICLE RESECTION;  Surgeon: Dorna Leitz, MD;  Location: [place];  Service: Orthopedics;  Laterality: Right;   SHOULDER ARTHROSCOPY WITH OPEN ROTATOR CUFF REPAIR Right 07/12/2015   Procedure: SHOULDER ARTHROSCOPY WITH ACROMIOPLASTY AND OPEN ROTATOR CUFF REPAIR;  Surgeon: Dorna Leitz, MD;  Location: [place];  Service: Orthopedics;  Laterality: Right;   SHOULDER ARTHROSCOPY WITH SUBACROMIAL DECOMPRESSION Left 11/10/2013   Procedure: SHOULDER ARTHROSCOPY WITH SUBACROMIAL DECOMPRESSION WITH ROTATOR CUFF REPAIR AND BICEPS TENODESIS;  Surgeon: Alta Corning, MD;  Location: [place];  Service: Orthopedics;  Laterality: Left;   SHOULDER ARTHROSCOPY WITH SUBACROMIAL DECOMPRESSION Right 12/21/2014   Procedure: SHOULDER ARTHROSCOPY WITH SUBACROMIAL DECOMPRESSION;  Surgeon: Dorna Leitz, MD;  Location: [place];  Service: Orthopedics;  Laterality: Right;   TONSILLECTOMY  age 18   TRANSURETHRAL RESECTION OF BLADDER TUMOR N/A 01/11/2014   Procedure: TRANSURETHRAL RESECTION OF BLADDER TUMOR (TURBT);  Surgeon: Festus Aloe, MD;  Location: [facility];  Service: Urology;  Laterality: N/A;   TUBAL LIGATION  1980    Current Outpatient Medications  Medication Sig Dispense Refill Last Dose   albuterol (PROAIR HFA) 108 (90 Base) MCG/ACT inhaler       amphetamine-dextroamphetamine (ADDERALL) 20 MG tablet Take 1 tablet (20 mg total) daily by mouth. 30 tablet 0  Calcium Carbonate (CALCIUM 500 PO) Take by mouth.      cholecalciferol (VITAMIN D) 1000 UNITS tablet Take 7,000 Units by mouth at bedtime.       clonazePAM (KLONOPIN) 2 MG tablet Take 1 tablet (2 mg total) by mouth at bedtime. for anxiety 60 tablet 0    DULoxetine (CYMBALTA) 30 MG capsule Take 1 capsule (30 mg total) by mouth daily. 30 capsule 3    escitalopram (LEXAPRO) 10 MG tablet Take 10 mg by mouth daily.      gabapentin (NEURONTIN) 100 MG capsule  Take 1 capsule (100 mg total) by mouth 3 (three) times daily as needed. Fibromyalgia pain (Patient taking differently: Take 100 mg by mouth 2 (two) times daily. Fibromyalgia pain) 90 capsule 1    levothyroxine (SYNTHROID) 75 MCG tablet 75 mcg Take as directed. (Patient not taking: Reported on 11/21/2020)      MAGNESIUM PO Take 450 mg by mouth daily.      tiotropium (SPIRIVA) 18 MCG inhalation capsule Place 18 mcg into inhaler and inhale daily.      No current facility-administered medications for this visit.   Allergies  Allergen Reactions   Sulfa Antibiotics Hives and Itching    Social History   Tobacco Use   Smoking status: Former    Packs/day: 1.00    Years: 30.00    Pack years: 30.00    Types: Cigarettes    Quit date: 2015    Years since quitting: 7.6   Smokeless tobacco: Never  Substance Use Topics   Alcohol use: No    Family History  Problem Relation Age of Onset   Cancer Father        Lung and stomach    Heart disease Father    Hypertension Father    Stroke Father    Heart failure Paternal Grandfather    Cancer Paternal Grandfather        colon cancer   Ovarian cancer Mother 1   Breast cancer Mother        dx in her late 2s-50s   Thyroid cancer Mother    Ovarian cancer Sister 44   Cancer Brother        abdominal tumor   Ovarian cancer Maternal Aunt    Breast cancer Maternal Aunt    Breast cancer Maternal Uncle    Breast cancer Paternal Aunt    Ovarian cancer Sister 15   Ovarian cancer Sister 9   Cancer Brother        abdominal tumor   Breast cancer Paternal Aunt    Breast cancer Paternal Aunt    Breast cancer Paternal Aunt    Breast cancer Cousin    Breast cancer Cousin      Review of Systems  Musculoskeletal:  Positive for arthralgias.  All other systems reviewed and are negative.  Objective:  Physical Exam HENT:     Head: Normocephalic.  Eyes:     Pupils: Pupils are equal, round, and reactive to light.  Cardiovascular:     Rate and  Rhythm: Normal rate.     Pulses: Normal pulses.  Pulmonary:     Effort: Pulmonary effort is normal.  Abdominal:     Palpations: Abdomen is soft.  Genitourinary:    Comments: Deferred Musculoskeletal:     Cervical back: Normal range of motion.     Comments: Pain with PROM left hip  Skin:    General: Skin is warm and dry.  Neurological:     Mental Status:  She is alert and oriented to person, place, and time.  Psychiatric:        Behavior: Behavior normal.    Vital signs in last 24 hours: '@VSRANGES'$ @  Labs:   Estimated body mass index is 38.79 kg/m as calculated from the following:   Height as of an earlier encounter on 11/21/20: '5\' 3"'$  (1.6 m).   Weight as of an earlier encounter on 11/21/20: 99.3 kg.   Imaging Review Plain radiographs demonstrate severe degenerative joint disease of the left hip(s). The bone quality appears to be adequate for age and reported activity level.      Assessment/Plan:  End stage arthritis, left hip(s)  The patient history, physical examination, clinical judgement of the provider and imaging studies are consistent with end stage degenerative joint disease of the left hip(s) and total hip arthroplasty is deemed medically necessary. The treatment options including medical management, injection therapy, arthroscopy and arthroplasty were discussed at length. The risks and benefits of total hip arthroplasty were presented and reviewed. The risks due to aseptic loosening, infection, stiffness, dislocation/subluxation,  thromboembolic complications and other imponderables were discussed.  The patient acknowledged the explanation, agreed to proceed with the plan and consent was signed. Patient is being admitted for inpatient treatment for surgery, pain control, PT, OT, prophylactic antibiotics, VTE prophylaxis, progressive ambulation and ADL's and discharge planning.The patient is planning to be discharged  home after overnight observation

## 2020-11-21 NOTE — H&P (View-Only) (Signed)
TOTAL HIP ADMISSION H&P  Patient is admitted for left total hip arthroplasty.  Subjective:  Chief Complaint: left hip pain  HPI: [name], 60 y.o. female, has a history of pain and functional disability in the left hip(s) due to arthritis and patient has failed non-surgical conservative treatments for greater than 12 weeks to include NSAID's and/or analgesics and activity modification.  Onset of symptoms was gradual starting 3 years ago with gradually worsening course since that time.The patient noted no past surgery on the left hip(s).  Patient currently rates pain in the left hip at 8 out of 10 with activity. Patient has worsening of pain with activity and weight bearing, pain that interfers with activities of daily living, and pain with passive range of motion. Patient has evidence of subchondral cysts, subchondral sclerosis, and joint space narrowing by imaging studies. This condition presents safety issues increasing the risk of falls. There is no current active infection.  Patient Active Problem List   Diagnosis Date Noted   Sciatica of right side 10/13/2019   Back pain 11/24/2015   Radicular pain 08/02/2015   Peripheral sensory neuropathy 05/10/2015   Obesity 07/05/2014   Vitamin D deficiency 07/05/2014   Thoracic aorta atherosclerosis ([place]) 07/05/2014   Bladder cancer (HCC)    Hypothyroidism    Restless leg syndrome    Fibromyalgia    Labile hypertension    Borderline hyperlipidemia    COPD (chronic obstructive pulmonary disease) (HCC)    GERD (gastroesophageal reflux disease)    Prediabetes    Bipolar 2 disorder, major depressive episode ([place])    Past Medical History:  Diagnosis Date   ADD (attention deficit disorder)    Anxiety    Arthritis    Bladder neoplasm    Cancer ([place]) 2015   Bladder cancer   COPD (chronic obstructive pulmonary disease) (HCC)    Depression    Fibroids    Fibromyalgia    H/O bronchitis    treated within the last week w steroids,  antibx tessalon pearls.    H/O sebaceous cyst 07/18/2014   left breast    Hemorrhoid    History of melanoma excision    leg   Hyperlipidemia    Hypertension    normal now, only took medications for 1 month- thinks it was from pain.   Hypothyroidism    Iron deficiency anemia    PONV (postoperative nausea and vomiting)    used a scop patch last surg-did well   Pre-diabetes    Prediabetes    Restless leg syndrome    Sciatica of right side 10/13/2019   Shortness of breath dyspnea    with exertion   Shoulder impingement    right   SUI (stress urinary incontinence, female)    Wears glasses     Past Surgical History:  Procedure Laterality Date   ABDOMINAL HYSTERECTOMY     ANKLE FUSION Right 2011   ANTERIOR CERVICAL DECOMP/DISCECTOMY FUSION N/A 09/01/2013   Procedure: Anterior cervical decompression fusion, cervical 5-6 with instrumentation and allograft;  Surgeon: Sinclair Ship, MD;  Location: [place];  Service: Orthopedics;  Laterality: N/A;  Anterior cervical decompression fusion, cervical 5-6 with instrumentation and allograft   ANTERIOR CERVICAL DECOMP/DISCECTOMY FUSION N/A 08/02/2015   Procedure: ANTERIOR CERVICAL DECOMPRESSION FUSION CERVICAL 6 - THORACIC 1 WITH INSTRUMENTATION AND ALLOGRAFT;  Surgeon: Phylliss Bob, MD;  Location: [place];  Service: Orthopedics;  Laterality: N/A;  ANTERIOR CERVICAL DECOMPRESSION FUSION, CERVICAL 6-7,CERVICAL 7-THORACIC 7 WITH INSTRUMENTATION AND ALLOGRAFT  CATARACT EXTRACTION W/ INTRAOCULAR LENS IMPLANT Bilateral    CLOSED REDUCTION AND PINNING LEFT FIFTH  PROXIMAL PHALANGEAL FX  10-09-2005   CYSTOSCOPY W/ RETROGRADES Left 01/11/2014   Procedure: CYSTOSCOPY WITH RETROGRADE PYELOGRAM;  Surgeon: Festus Aloe, MD;  Location[facility];  Service: Urology;  Laterality: Left;   CYSTOSCOPY WITH STENT PLACEMENT Left 01/11/2014   Procedure: CYSTOSCOPY WITH STENT PLACEMENT;  Surgeon: Festus Aloe, MD;  Location[facility];  Service: Urology;  Laterality: Left;   D & C HYSTEROSCOPY WITH NOVASURE ENDOMETRIAL ABLATION  07-16-2006   KNEE ARTHROSCOPY WITH MEDIAL MENISECTOMY Right 05/22/2012   Procedure: KNEE ARTHROSCOPY WITH MEDIAL MENISECTOMY and Medial Plica excision;  Surgeon: Alta Corning, MD;  Location: [place];  Service: Orthopedics;  Laterality: Right;   LAPAROSCOPIC ASSISTED VAGINAL HYSTERECTOMY N/A 08/29/2014   Procedure: LAPAROSCOPIC ASSISTED VAGINAL HYSTERECTOMY;  Surgeon: Molli Posey, MD;  Location: [place];  Service: Gynecology;  Laterality: N/A;   LAPAROSCOPIC CHOLECYSTECTOMY  2005   LAPAROTOMY N/A 08/29/2014   Procedure: EXPLORATORY LAPAROTOMY WITH LIGATION OF BLEEDER;  Surgeon: Molli Posey, MD;  Location: [facility];  Service: Gynecology;  Laterality: N/A;   POSTERIOR CERVICAL FUSION/FORAMINOTOMY N/A 01/25/2015   Procedure: POSTERIOR CERVICAL DECOMPRESSSION CERVICAL 7-THORACIC 1;  Surgeon: Phylliss Bob, MD;  Location: [place];  Service: Orthopedics;  Laterality: N/A;  Posterior cervical decompression, cervical 5-6, cervical 6-7, cervical 7-thoracic 1    PUBOVAGINAL SLING N/A 08/29/2014   Procedure: SOLYX SINGLE INCISION SLING;  Surgeon: Molli Posey, MD;  Location: [place];  Service: Gynecology;  Laterality: N/A;   SALPINGOOPHORECTOMY Bilateral 08/29/2014   Procedure: SALPINGO OOPHORECTOMY;  Surgeon: Molli Posey, MD;  Location: [facility];  Service: Gynecology;  Laterality: Bilateral;   SHOULDER ARTHROSCOPY W/ SUBACROMIAL DECOMPRESSION AND DISTAL CLAVICLE EXCISION Left 11-27-2011   W/  DEBRIDEMENT   SHOULDER ARTHROSCOPY WITH BICEPSTENOTOMY Right 12/21/2014   Procedure: SHOULDER ARTHROSCOPY WITH BICEPSTENOTOMY;  Surgeon: Dorna Leitz, MD;  Location: [place];  Service: Orthopedics;  Laterality: Right;   SHOULDER ARTHROSCOPY WITH DISTAL CLAVICLE RESECTION Right 12/21/2014   Procedure: SHOULDER  ARTHROSCOPY WITH DISTAL CLAVICLE RESECTION;  Surgeon: Dorna Leitz, MD;  Location: [place];  Service: Orthopedics;  Laterality: Right;   SHOULDER ARTHROSCOPY WITH OPEN ROTATOR CUFF REPAIR Right 07/12/2015   Procedure: SHOULDER ARTHROSCOPY WITH ACROMIOPLASTY AND OPEN ROTATOR CUFF REPAIR;  Surgeon: Dorna Leitz, MD;  Location: [place];  Service: Orthopedics;  Laterality: Right;   SHOULDER ARTHROSCOPY WITH SUBACROMIAL DECOMPRESSION Left 11/10/2013   Procedure: SHOULDER ARTHROSCOPY WITH SUBACROMIAL DECOMPRESSION WITH ROTATOR CUFF REPAIR AND BICEPS TENODESIS;  Surgeon: Alta Corning, MD;  Location: [place];  Service: Orthopedics;  Laterality: Left;   SHOULDER ARTHROSCOPY WITH SUBACROMIAL DECOMPRESSION Right 12/21/2014   Procedure: SHOULDER ARTHROSCOPY WITH SUBACROMIAL DECOMPRESSION;  Surgeon: Dorna Leitz, MD;  Location: [place];  Service: Orthopedics;  Laterality: Right;   TONSILLECTOMY  age 21   TRANSURETHRAL RESECTION OF BLADDER TUMOR N/A 01/11/2014   Procedure: TRANSURETHRAL RESECTION OF BLADDER TUMOR (TURBT);  Surgeon: Festus Aloe, MD;  Location: [facility];  Service: Urology;  Laterality: N/A;   TUBAL LIGATION  1980    Current Outpatient Medications  Medication Sig Dispense Refill Last Dose   albuterol (PROAIR HFA) 108 (90 Base) MCG/ACT inhaler       amphetamine-dextroamphetamine (ADDERALL) 20 MG tablet Take 1 tablet (20 mg total) daily by mouth. 30 tablet 0  Calcium Carbonate (CALCIUM 500 PO) Take by mouth.      cholecalciferol (VITAMIN D) 1000 UNITS tablet Take 7,000 Units by mouth at bedtime.       clonazePAM (KLONOPIN) 2 MG tablet Take 1 tablet (2 mg total) by mouth at bedtime. for anxiety 60 tablet 0    DULoxetine (CYMBALTA) 30 MG capsule Take 1 capsule (30 mg total) by mouth daily. 30 capsule 3    escitalopram (LEXAPRO) 10 MG tablet Take 10 mg by mouth daily.      gabapentin (NEURONTIN) 100 MG capsule  Take 1 capsule (100 mg total) by mouth 3 (three) times daily as needed. Fibromyalgia pain (Patient taking differently: Take 100 mg by mouth 2 (two) times daily. Fibromyalgia pain) 90 capsule 1    levothyroxine (SYNTHROID) 75 MCG tablet 75 mcg Take as directed. (Patient not taking: Reported on 11/21/2020)      MAGNESIUM PO Take 450 mg by mouth daily.      tiotropium (SPIRIVA) 18 MCG inhalation capsule Place 18 mcg into inhaler and inhale daily.      No current facility-administered medications for this visit.   Allergies  Allergen Reactions   Sulfa Antibiotics Hives and Itching    Social History   Tobacco Use   Smoking status: Former    Packs/day: 1.00    Years: 30.00    Pack years: 30.00    Types: Cigarettes    Quit date: 2015    Years since quitting: 7.6   Smokeless tobacco: Never  Substance Use Topics   Alcohol use: No    Family History  Problem Relation Age of Onset   Cancer Father        Lung and stomach    Heart disease Father    Hypertension Father    Stroke Father    Heart failure Paternal Grandfather    Cancer Paternal Grandfather        colon cancer   Ovarian cancer Mother 21   Breast cancer Mother        dx in her late 56s-50s   Thyroid cancer Mother    Ovarian cancer Sister 50   Cancer Brother        abdominal tumor   Ovarian cancer Maternal Aunt    Breast cancer Maternal Aunt    Breast cancer Maternal Uncle    Breast cancer Paternal Aunt    Ovarian cancer Sister 93   Ovarian cancer Sister 17   Cancer Brother        abdominal tumor   Breast cancer Paternal Aunt    Breast cancer Paternal Aunt    Breast cancer Paternal Aunt    Breast cancer Cousin    Breast cancer Cousin      Review of Systems  Musculoskeletal:  Positive for arthralgias.  All other systems reviewed and are negative.  Objective:  Physical Exam HENT:     Head: Normocephalic.  Eyes:     Pupils: Pupils are equal, round, and reactive to light.  Cardiovascular:     Rate and  Rhythm: Normal rate.     Pulses: Normal pulses.  Pulmonary:     Effort: Pulmonary effort is normal.  Abdominal:     Palpations: Abdomen is soft.  Genitourinary:    Comments: Deferred Musculoskeletal:     Cervical back: Normal range of motion.     Comments: Pain with PROM left hip  Skin:    General: Skin is warm and dry.  Neurological:     Mental Status:  She is alert and oriented to person, place, and time.  Psychiatric:        Behavior: Behavior normal.    Vital signs in last 24 hours: '@VSRANGES'$ @  Labs:   Estimated body mass index is 38.79 kg/m as calculated from the following:   Height as of an earlier encounter on 11/21/20: '5\' 3"'$  (1.6 m).   Weight as of an earlier encounter on 11/21/20: 99.3 kg.   Imaging Review Plain radiographs demonstrate severe degenerative joint disease of the left hip(s). The bone quality appears to be adequate for age and reported activity level.      Assessment/Plan:  End stage arthritis, left hip(s)  The patient history, physical examination, clinical judgement of the provider and imaging studies are consistent with end stage degenerative joint disease of the left hip(s) and total hip arthroplasty is deemed medically necessary. The treatment options including medical management, injection therapy, arthroscopy and arthroplasty were discussed at length. The risks and benefits of total hip arthroplasty were presented and reviewed. The risks due to aseptic loosening, infection, stiffness, dislocation/subluxation,  thromboembolic complications and other imponderables were discussed.  The patient acknowledged the explanation, agreed to proceed with the plan and consent was signed. Patient is being admitted for inpatient treatment for surgery, pain control, PT, OT, prophylactic antibiotics, VTE prophylaxis, progressive ambulation and ADL's and discharge planning.The patient is planning to be discharged  home after overnight observation

## 2020-11-29 NOTE — Anesthesia Preprocedure Evaluation (Addendum)
Anesthesia Evaluation  Patient identified by MRN, date of birth, ID band Patient awake    Reviewed: Allergy & Precautions, NPO status , Patient's Chart, lab work & pertinent test results  History of Anesthesia Complications (+) PONV  Airway Mallampati: II  TM Distance: >3 FB Neck ROM: Full    Dental no notable dental hx. (+) Teeth Intact, Implants, Dental Advisory Given   Pulmonary COPD, former smoker,    Pulmonary exam normal breath sounds clear to auscultation       Cardiovascular Normal cardiovascular exam Rhythm:Regular Rate:Normal     Neuro/Psych Bipolar Disorder    GI/Hepatic Neg liver ROS, GERD  ,  Endo/Other    Renal/GU negative Renal ROS   Bladder CA    Musculoskeletal  (+) Arthritis , Fibromyalgia -  Abdominal (+) + obese,   Peds  Hematology Hgb 14.6   Anesthesia Other Findings   Reproductive/Obstetrics                            Anesthesia Physical Anesthesia Plan  ASA: 3  Anesthesia Plan: Spinal   Post-op Pain Management:    Induction: Intravenous  PONV Risk Score and Plan: 4 or greater and Treatment may vary due to age or medical condition, Ondansetron and Midazolam  Airway Management Planned: Nasal Cannula and Natural Airway  Additional Equipment: None  Intra-op Plan:   Post-operative Plan:   Informed Consent: I have reviewed the patients History and Physical, chart, labs and discussed the procedure including the risks, benefits and alternatives for the proposed anesthesia with the patient or authorized representative who has indicated his/her understanding and acceptance.     Dental advisory given  Plan Discussed with: CRNA and Anesthesiologist  Anesthesia Plan Comments: (Spinal)       Anesthesia Quick Evaluation

## 2020-11-30 ENCOUNTER — Ambulatory Visit (HOSPITAL_COMMUNITY): Payer: PPO

## 2020-11-30 ENCOUNTER — Encounter (HOSPITAL_COMMUNITY)
Admission: RE | Disposition: A | Discharge status: Home / Self Care | Payer: Self-pay | Source: Home / Self Care | Attending: Orthopedic Surgery

## 2020-11-30 ENCOUNTER — Ambulatory Visit (HOSPITAL_COMMUNITY): Payer: PPO | Admitting: Anesthesiology

## 2020-11-30 ENCOUNTER — Observation Stay (HOSPITAL_COMMUNITY): Payer: PPO

## 2020-11-30 ENCOUNTER — Other Ambulatory Visit: Payer: Self-pay

## 2020-11-30 ENCOUNTER — Observation Stay (HOSPITAL_COMMUNITY)
Admission: RE | Admit: 2020-11-30 | Discharge: 2020-12-01 | Disposition: A | Discharge status: Home / Self Care | Payer: PPO | Attending: Orthopedic Surgery | Admitting: Orthopedic Surgery

## 2020-11-30 ENCOUNTER — Encounter (HOSPITAL_COMMUNITY): Payer: Self-pay | Admitting: Orthopedic Surgery

## 2020-11-30 DIAGNOSIS — J449 Chronic obstructive pulmonary disease, unspecified: Secondary | ICD-10-CM | POA: Insufficient documentation

## 2020-11-30 DIAGNOSIS — M1612 Unilateral primary osteoarthritis, left hip: Secondary | ICD-10-CM | POA: Diagnosis not present

## 2020-11-30 DIAGNOSIS — E559 Vitamin D deficiency, unspecified: Secondary | ICD-10-CM | POA: Diagnosis not present

## 2020-11-30 DIAGNOSIS — D509 Iron deficiency anemia, unspecified: Secondary | ICD-10-CM | POA: Diagnosis not present

## 2020-11-30 DIAGNOSIS — W19XXXA Unspecified fall, initial encounter: Secondary | ICD-10-CM

## 2020-11-30 DIAGNOSIS — R7303 Prediabetes: Secondary | ICD-10-CM | POA: Insufficient documentation

## 2020-11-30 DIAGNOSIS — Z8551 Personal history of malignant neoplasm of bladder: Secondary | ICD-10-CM | POA: Insufficient documentation

## 2020-11-30 DIAGNOSIS — Z79899 Other long term (current) drug therapy: Secondary | ICD-10-CM | POA: Diagnosis not present

## 2020-11-30 DIAGNOSIS — E039 Hypothyroidism, unspecified: Secondary | ICD-10-CM | POA: Insufficient documentation

## 2020-11-30 DIAGNOSIS — Z96642 Presence of left artificial hip joint: Secondary | ICD-10-CM | POA: Diagnosis not present

## 2020-11-30 DIAGNOSIS — Z09 Encounter for follow-up examination after completed treatment for conditions other than malignant neoplasm: Secondary | ICD-10-CM

## 2020-11-30 DIAGNOSIS — I10 Essential (primary) hypertension: Secondary | ICD-10-CM | POA: Insufficient documentation

## 2020-11-30 DIAGNOSIS — Z87891 Personal history of nicotine dependence: Secondary | ICD-10-CM | POA: Insufficient documentation

## 2020-11-30 DIAGNOSIS — Z419 Encounter for procedure for purposes other than remedying health state, unspecified: Secondary | ICD-10-CM

## 2020-11-30 DIAGNOSIS — Z471 Aftercare following joint replacement surgery: Secondary | ICD-10-CM | POA: Diagnosis not present

## 2020-11-30 HISTORY — PX: TOTAL HIP ARTHROPLASTY: SHX124

## 2020-11-30 LAB — TYPE AND SCREEN
ABO/RH(D): A POS
Antibody Screen: NEGATIVE

## 2020-11-30 LAB — HEMOGLOBIN A1C
Hgb A1c MFr Bld: 6.1 % — ABNORMAL HIGH (ref 4.8–5.6)
Mean Plasma Glucose: 128.37 mg/dL

## 2020-11-30 SURGERY — ARTHROPLASTY, HIP, TOTAL, ANTERIOR APPROACH
Anesthesia: Spinal | Site: Hip | Laterality: Left

## 2020-11-30 MED ORDER — UMECLIDINIUM BROMIDE 62.5 MCG/INH IN AEPB
1.0000 | INHALATION_SPRAY | Freq: Every day | RESPIRATORY_TRACT | Status: DC
  Filled 2020-11-30: qty 7

## 2020-11-30 MED ORDER — HYDROCODONE-ACETAMINOPHEN 5-325 MG PO TABS: 1.0000 | ORAL_TABLET | ORAL | Status: DC | PRN

## 2020-11-30 MED ORDER — OXYCODONE HCL 5 MG/5ML PO SOLN: 5.0000 mg | Freq: Once | ORAL | Status: AC | PRN

## 2020-11-30 MED ORDER — BUPIVACAINE-EPINEPHRINE 0.25% -1:200000 IJ SOLN
INTRAMUSCULAR | Status: DC | PRN
  Administered 2020-11-30: 30 mL

## 2020-11-30 MED ORDER — ALBUTEROL SULFATE (2.5 MG/3ML) 0.083% IN NEBU: 2.5000 mg | INHALATION_SOLUTION | Freq: Four times a day (QID) | RESPIRATORY_TRACT | Status: DC | PRN

## 2020-11-30 MED ORDER — SODIUM CHLORIDE (PF) 0.9 % IJ SOLN
INTRAMUSCULAR | Status: DC | PRN
  Administered 2020-11-30: 30 mL

## 2020-11-30 MED ORDER — MIDAZOLAM HCL 5 MG/5ML IJ SOLN
INTRAMUSCULAR | Status: DC | PRN
  Administered 2020-11-30: 2 mg via INTRAVENOUS

## 2020-11-30 MED ORDER — CEFAZOLIN SODIUM-DEXTROSE 2-4 GM/100ML-% IV SOLN
2.0000 g | Freq: Four times a day (QID) | INTRAVENOUS | Status: AC
  Administered 2020-11-30 (×2): 2 g via INTRAVENOUS
  Filled 2020-11-30 (×2): qty 100

## 2020-11-30 MED ORDER — SODIUM CHLORIDE 0.9 % IV SOLN: INTRAVENOUS | Status: DC

## 2020-11-30 MED ORDER — HYDROMORPHONE HCL 1 MG/ML IJ SOLN: 0.2500 mg | INTRAMUSCULAR | Status: DC | PRN

## 2020-11-30 MED ORDER — KETOROLAC TROMETHAMINE 15 MG/ML IJ SOLN
15.0000 mg | Freq: Four times a day (QID) | INTRAMUSCULAR | Status: AC
  Administered 2020-11-30 – 2020-12-01 (×4): 15 mg via INTRAVENOUS
  Filled 2020-11-30 (×4): qty 1

## 2020-11-30 MED ORDER — POVIDONE-IODINE 10 % EX SWAB: 2.0000 "application " | Freq: Once | CUTANEOUS | Status: DC

## 2020-11-30 MED ORDER — POVIDONE-IODINE 10 % EX SWAB
2.0000 "application " | Freq: Once | CUTANEOUS | Status: AC
  Administered 2020-11-30: 2 via TOPICAL

## 2020-11-30 MED ORDER — ALBUTEROL SULFATE (2.5 MG/3ML) 0.083% IN NEBU: 2.5000 mg | INHALATION_SOLUTION | Freq: Every day | RESPIRATORY_TRACT | Status: DC

## 2020-11-30 MED ORDER — WATER FOR IRRIGATION, STERILE IR SOLN
Status: DC | PRN
  Administered 2020-11-30: 2000 mL

## 2020-11-30 MED ORDER — KETOROLAC TROMETHAMINE 30 MG/ML IJ SOLN
INTRAMUSCULAR | Status: DC | PRN
  Administered 2020-11-30: 30 mg via INTRA_ARTICULAR

## 2020-11-30 MED ORDER — OXYCODONE HCL 5 MG PO TABS
ORAL_TABLET | ORAL | Status: AC
  Filled 2020-11-30: qty 1

## 2020-11-30 MED ORDER — MENTHOL 3 MG MT LOZG: 1.0000 | LOZENGE | OROMUCOSAL | Status: DC | PRN

## 2020-11-30 MED ORDER — BUPIVACAINE IN DEXTROSE 0.75-8.25 % IT SOLN
INTRATHECAL | Status: DC | PRN
  Administered 2020-11-30: 12.75 mg via INTRATHECAL

## 2020-11-30 MED ORDER — DEXAMETHASONE SODIUM PHOSPHATE 10 MG/ML IJ SOLN
10.0000 mg | Freq: Once | INTRAMUSCULAR | Status: AC
  Administered 2020-12-01: 10 mg via INTRAVENOUS
  Filled 2020-11-30: qty 1

## 2020-11-30 MED ORDER — ACETAMINOPHEN 10 MG/ML IV SOLN
1000.0000 mg | Freq: Once | INTRAVENOUS | Status: AC
  Administered 2020-11-30: 1000 mg via INTRAVENOUS
  Filled 2020-11-30: qty 100

## 2020-11-30 MED ORDER — LACTATED RINGERS IV SOLN: INTRAVENOUS | Status: DC

## 2020-11-30 MED ORDER — METHOCARBAMOL 500 MG PO TABS
500.0000 mg | ORAL_TABLET | Freq: Four times a day (QID) | ORAL | Status: DC | PRN
  Administered 2020-11-30 – 2020-12-01 (×4): 500 mg via ORAL
  Filled 2020-11-30 (×4): qty 1

## 2020-11-30 MED ORDER — DOCUSATE SODIUM 100 MG PO CAPS
100.0000 mg | ORAL_CAPSULE | Freq: Two times a day (BID) | ORAL | Status: DC
  Administered 2020-11-30 – 2020-12-01 (×2): 100 mg via ORAL
  Filled 2020-11-30 (×2): qty 1

## 2020-11-30 MED ORDER — AMPHETAMINE-DEXTROAMPHETAMINE 20 MG PO TABS
20.0000 mg | ORAL_TABLET | Freq: Every day | ORAL | Status: DC
  Filled 2020-11-30: qty 1

## 2020-11-30 MED ORDER — ORAL CARE MOUTH RINSE: 15.0000 mL | Freq: Once | OROMUCOSAL | Status: AC

## 2020-11-30 MED ORDER — METOCLOPRAMIDE HCL 5 MG PO TABS: 5.0000 mg | ORAL_TABLET | Freq: Three times a day (TID) | ORAL | Status: DC | PRN

## 2020-11-30 MED ORDER — ONDANSETRON HCL 4 MG/2ML IJ SOLN
INTRAMUSCULAR | Status: DC | PRN
  Administered 2020-11-30: 4 mg via INTRAVENOUS

## 2020-11-30 MED ORDER — APREPITANT 40 MG PO CAPS
40.0000 mg | ORAL_CAPSULE | Freq: Once | ORAL | Status: AC
  Administered 2020-11-30: 40 mg via ORAL
  Filled 2020-11-30: qty 1

## 2020-11-30 MED ORDER — ALUM & MAG HYDROXIDE-SIMETH 200-200-20 MG/5ML PO SUSP
30.0000 mL | ORAL | Status: DC | PRN
  Administered 2020-11-30: 30 mL via ORAL
  Filled 2020-11-30: qty 30

## 2020-11-30 MED ORDER — ONDANSETRON HCL 4 MG/2ML IJ SOLN
INTRAMUSCULAR | Status: AC
  Filled 2020-11-30: qty 2

## 2020-11-30 MED ORDER — PROPOFOL 10 MG/ML IV BOLUS
INTRAVENOUS | Status: AC
  Filled 2020-11-30: qty 20

## 2020-11-30 MED ORDER — DIPHENHYDRAMINE HCL 12.5 MG/5ML PO ELIX: 12.5000 mg | ORAL_SOLUTION | ORAL | Status: DC | PRN

## 2020-11-30 MED ORDER — SENNA 8.6 MG PO TABS
1.0000 | ORAL_TABLET | Freq: Two times a day (BID) | ORAL | Status: DC
  Administered 2020-11-30 – 2020-12-01 (×2): 8.6 mg via ORAL
  Filled 2020-11-30 (×2): qty 1

## 2020-11-30 MED ORDER — TRANEXAMIC ACID-NACL 1000-0.7 MG/100ML-% IV SOLN
1000.0000 mg | INTRAVENOUS | Status: AC
  Administered 2020-11-30: 1000 mg via INTRAVENOUS
  Filled 2020-11-30: qty 100

## 2020-11-30 MED ORDER — SODIUM CHLORIDE 0.9 % IV SOLN
6.2500 mg | Freq: Four times a day (QID) | INTRAVENOUS | Status: DC | PRN
  Filled 2020-11-30: qty 0.25

## 2020-11-30 MED ORDER — FLUTICASONE FUROATE-VILANTEROL 100-25 MCG/INH IN AEPB
1.0000 | INHALATION_SPRAY | Freq: Every day | RESPIRATORY_TRACT | Status: DC
  Filled 2020-11-30: qty 28

## 2020-11-30 MED ORDER — METHOCARBAMOL 1000 MG/10ML IJ SOLN
500.0000 mg | Freq: Four times a day (QID) | INTRAVENOUS | Status: DC | PRN
  Filled 2020-11-30: qty 5

## 2020-11-30 MED ORDER — KETOROLAC TROMETHAMINE 30 MG/ML IJ SOLN
INTRAMUSCULAR | Status: AC
  Filled 2020-11-30: qty 1

## 2020-11-30 MED ORDER — ESCITALOPRAM OXALATE 20 MG PO TABS
20.0000 mg | ORAL_TABLET | Freq: Every evening | ORAL | Status: DC
  Administered 2020-11-30: 20 mg via ORAL
  Filled 2020-11-30: qty 1

## 2020-11-30 MED ORDER — AMPHETAMINE-DEXTROAMPHETAMINE 20 MG PO TABS: 20.0000 mg | ORAL_TABLET | Freq: Every day | ORAL | Status: DC

## 2020-11-30 MED ORDER — CLONAZEPAM 1 MG PO TABS
2.0000 mg | ORAL_TABLET | Freq: Every day | ORAL | Status: DC
  Administered 2020-11-30: 2 mg via ORAL
  Filled 2020-11-30: qty 2

## 2020-11-30 MED ORDER — UMECLIDINIUM BROMIDE 62.5 MCG/INH IN AEPB
1.0000 | INHALATION_SPRAY | Freq: Every day | RESPIRATORY_TRACT | Status: DC
  Administered 2020-12-01: 1 via RESPIRATORY_TRACT
  Filled 2020-11-30: qty 7

## 2020-11-30 MED ORDER — METOCLOPRAMIDE HCL 5 MG/ML IJ SOLN
5.0000 mg | Freq: Three times a day (TID) | INTRAMUSCULAR | Status: DC | PRN
  Administered 2020-11-30: 10 mg via INTRAVENOUS
  Filled 2020-11-30: qty 2

## 2020-11-30 MED ORDER — PHENOL 1.4 % MT LIQD: 1.0000 | OROMUCOSAL | Status: DC | PRN

## 2020-11-30 MED ORDER — ALBUTEROL SULFATE (2.5 MG/3ML) 0.083% IN NEBU
2.5000 mg | INHALATION_SOLUTION | Freq: Every day | RESPIRATORY_TRACT | Status: DC
  Administered 2020-12-01: 2.5 mg via RESPIRATORY_TRACT
  Filled 2020-11-30: qty 3

## 2020-11-30 MED ORDER — ISOPROPYL ALCOHOL 70 % SOLN
Status: DC | PRN
  Administered 2020-11-30: 1 via TOPICAL

## 2020-11-30 MED ORDER — ONDANSETRON HCL 4 MG/2ML IJ SOLN
4.0000 mg | Freq: Four times a day (QID) | INTRAMUSCULAR | Status: DC | PRN
  Administered 2020-12-01: 4 mg via INTRAVENOUS
  Filled 2020-11-30: qty 2

## 2020-11-30 MED ORDER — GABAPENTIN 100 MG PO CAPS
100.0000 mg | ORAL_CAPSULE | Freq: Two times a day (BID) | ORAL | Status: DC
  Administered 2020-11-30 – 2020-12-01 (×2): 100 mg via ORAL
  Filled 2020-11-30 (×2): qty 1

## 2020-11-30 MED ORDER — ASPIRIN 81 MG PO CHEW
81.0000 mg | CHEWABLE_TABLET | Freq: Two times a day (BID) | ORAL | Status: DC
  Administered 2020-11-30 – 2020-12-01 (×2): 81 mg via ORAL
  Filled 2020-11-30 (×2): qty 1

## 2020-11-30 MED ORDER — FENTANYL CITRATE (PF) 250 MCG/5ML IJ SOLN
INTRAMUSCULAR | Status: DC | PRN
  Administered 2020-11-30 (×2): 50 ug via INTRAVENOUS

## 2020-11-30 MED ORDER — SODIUM CHLORIDE 0.9 % IR SOLN
Status: DC | PRN
  Administered 2020-11-30: 1000 mL

## 2020-11-30 MED ORDER — MORPHINE SULFATE (PF) 2 MG/ML IV SOLN: 0.5000 mg | INTRAVENOUS | Status: DC | PRN

## 2020-11-30 MED ORDER — MAGNESIUM OXIDE 400 MG PO TABS
400.0000 mg | ORAL_TABLET | Freq: Every day | ORAL | Status: DC | PRN
  Filled 2020-11-30: qty 1

## 2020-11-30 MED ORDER — TIOTROPIUM BROMIDE MONOHYDRATE 18 MCG IN CAPS: 18.0000 ug | ORAL_CAPSULE | Freq: Every day | RESPIRATORY_TRACT | Status: DC

## 2020-11-30 MED ORDER — CHLORHEXIDINE GLUCONATE 0.12 % MT SOLN
15.0000 mL | Freq: Once | OROMUCOSAL | Status: AC
  Administered 2020-11-30: 15 mL via OROMUCOSAL

## 2020-11-30 MED ORDER — AMISULPRIDE (ANTIEMETIC) 5 MG/2ML IV SOLN: 10.0000 mg | Freq: Once | INTRAVENOUS | Status: DC | PRN

## 2020-11-30 MED ORDER — CEFAZOLIN SODIUM-DEXTROSE 2-4 GM/100ML-% IV SOLN
2.0000 g | INTRAVENOUS | Status: AC
  Administered 2020-11-30: 2 g via INTRAVENOUS
  Filled 2020-11-30: qty 100

## 2020-11-30 MED ORDER — FLUTICASONE FUROATE-VILANTEROL 100-25 MCG/INH IN AEPB
1.0000 | INHALATION_SPRAY | Freq: Every day | RESPIRATORY_TRACT | Status: DC
  Administered 2020-12-01: 1 via RESPIRATORY_TRACT
  Filled 2020-11-30: qty 28

## 2020-11-30 MED ORDER — OXYCODONE HCL 5 MG PO TABS
5.0000 mg | ORAL_TABLET | ORAL | Status: DC | PRN
  Administered 2020-11-30 – 2020-12-01 (×3): 5 mg via ORAL
  Filled 2020-11-30 (×3): qty 1

## 2020-11-30 MED ORDER — OXYCODONE HCL 5 MG PO TABS
5.0000 mg | ORAL_TABLET | Freq: Once | ORAL | Status: AC | PRN
  Administered 2020-11-30: 5 mg via ORAL

## 2020-11-30 MED ORDER — ACETAMINOPHEN 325 MG PO TABS: 325.0000 mg | ORAL_TABLET | Freq: Four times a day (QID) | ORAL | Status: DC | PRN

## 2020-11-30 MED ORDER — ONDANSETRON HCL 4 MG PO TABS
4.0000 mg | ORAL_TABLET | Freq: Four times a day (QID) | ORAL | Status: DC | PRN
  Administered 2020-11-30 – 2020-12-01 (×2): 4 mg via ORAL
  Filled 2020-11-30 (×2): qty 1

## 2020-11-30 MED ORDER — ACETAMINOPHEN 10 MG/ML IV SOLN: 1000.0000 mg | Freq: Once | INTRAVENOUS | Status: DC | PRN

## 2020-11-30 MED ORDER — POLYETHYLENE GLYCOL 3350 17 G PO PACK: 17.0000 g | PACK | Freq: Every day | ORAL | Status: DC | PRN

## 2020-11-30 MED ORDER — ONDANSETRON HCL 4 MG/2ML IJ SOLN: 4.0000 mg | Freq: Once | INTRAMUSCULAR | Status: DC | PRN

## 2020-11-30 MED ORDER — OXYCODONE HCL 5 MG PO TABS: 10.0000 mg | ORAL_TABLET | ORAL | Status: DC | PRN

## 2020-11-30 MED ORDER — ALBUTEROL SULFATE HFA 108 (90 BASE) MCG/ACT IN AERS: 2.0000 | INHALATION_SPRAY | RESPIRATORY_TRACT | Status: DC

## 2020-11-30 MED ORDER — HYDROCODONE-ACETAMINOPHEN 7.5-325 MG PO TABS
1.0000 | ORAL_TABLET | ORAL | Status: DC | PRN
  Administered 2020-11-30: 1 via ORAL
  Filled 2020-11-30: qty 1

## 2020-11-30 MED ORDER — LIDOCAINE 2% (20 MG/ML) 5 ML SYRINGE
INTRAMUSCULAR | Status: AC
  Filled 2020-11-30: qty 5

## 2020-11-30 MED ORDER — PROPOFOL 500 MG/50ML IV EMUL
INTRAVENOUS | Status: DC | PRN
  Administered 2020-11-30: 100 ug/kg/min via INTRAVENOUS

## 2020-11-30 MED ORDER — FENTANYL CITRATE (PF) 100 MCG/2ML IJ SOLN
INTRAMUSCULAR | Status: AC
  Filled 2020-11-30: qty 2

## 2020-11-30 MED ORDER — BUPIVACAINE-EPINEPHRINE (PF) 0.25% -1:200000 IJ SOLN
INTRAMUSCULAR | Status: AC
  Filled 2020-11-30: qty 30

## 2020-11-30 MED ORDER — MIDAZOLAM HCL 2 MG/2ML IJ SOLN
INTRAMUSCULAR | Status: AC
  Filled 2020-11-30: qty 2

## 2020-11-30 SURGICAL SUPPLY — 65 items
ADH SKN CLS APL DERMABOND .7 (GAUZE/BANDAGES/DRESSINGS) ×1
APL PRP STRL LF DISP 70% ISPRP (MISCELLANEOUS) ×1
BAG COUNTER SPONGE SURGICOUNT (BAG) IMPLANT
BAG DECANTER FOR FLEXI CONT (MISCELLANEOUS) IMPLANT
BAG SPEC THK2 15X12 ZIP CLS (MISCELLANEOUS)
BAG SPNG CNTER NS LX DISP (BAG)
BAG ZIPLOCK 12X15 (MISCELLANEOUS) IMPLANT
BALL HIP CERAMIC (Hips) IMPLANT
BLADE SURG SZ10 CARB STEEL (BLADE) IMPLANT
CHLORAPREP W/TINT 26 (MISCELLANEOUS) ×2 IMPLANT
COVER PERINEAL POST (MISCELLANEOUS) ×2 IMPLANT
COVER SURGICAL LIGHT HANDLE (MISCELLANEOUS) ×2 IMPLANT
DECANTER SPIKE VIAL GLASS SM (MISCELLANEOUS) ×2 IMPLANT
DERMABOND ADVANCED (GAUZE/BANDAGES/DRESSINGS) ×1
DERMABOND ADVANCED .7 DNX12 (GAUZE/BANDAGES/DRESSINGS) ×2 IMPLANT
DRAPE IMP U-DRAPE 54X76 (DRAPES) ×2 IMPLANT
DRAPE SHEET LG 3/4 BI-LAMINATE (DRAPES) ×6 IMPLANT
DRAPE STERI IOBAN 125X83 (DRAPES) IMPLANT
DRAPE U-SHAPE 47X51 STRL (DRAPES) ×4 IMPLANT
DRSG AQUACEL AG ADV 3.5X10 (GAUZE/BANDAGES/DRESSINGS) ×2 IMPLANT
ELECT REM PT RETURN 15FT ADLT (MISCELLANEOUS) ×2 IMPLANT
GAUZE SPONGE 4X4 12PLY STRL (GAUZE/BANDAGES/DRESSINGS) ×2 IMPLANT
GLOVE SRG 8 PF TXTR STRL LF DI (GLOVE) ×1 IMPLANT
GLOVE SURG ENC MOIS LTX SZ8.5 (GLOVE) ×4 IMPLANT
GLOVE SURG ENC TEXT LTX SZ7.5 (GLOVE) ×4 IMPLANT
GLOVE SURG UNDER POLY LF SZ8 (GLOVE) ×2
GLOVE SURG UNDER POLY LF SZ8.5 (GLOVE) ×2 IMPLANT
GOWN SPEC L3 XXLG W/TWL (GOWN DISPOSABLE) ×2 IMPLANT
GOWN STRL REUS W/TWL XL LVL3 (GOWN DISPOSABLE) ×2 IMPLANT
HANDPIECE INTERPULSE COAX TIP (DISPOSABLE) ×2
HIP BALL CERAMIC (Hips) ×2 IMPLANT
HOLDER FOLEY CATH W/STRAP (MISCELLANEOUS) ×2 IMPLANT
HOOD PEEL AWAY FLYTE STAYCOOL (MISCELLANEOUS) ×8 IMPLANT
JET LAVAGE IRRISEPT WOUND (IRRIGATION / IRRIGATOR)
KIT TURNOVER KIT A (KITS) ×2 IMPLANT
LAVAGE JET IRRISEPT WOUND (IRRIGATION / IRRIGATOR) IMPLANT
LINER PINN ACET NEUT 32X52 (Liner) ×1 IMPLANT
MANIFOLD NEPTUNE II (INSTRUMENTS) ×2 IMPLANT
MARKER SKIN DUAL TIP RULER LAB (MISCELLANEOUS) ×2 IMPLANT
NDL SAFETY ECLIPSE 18X1.5 (NEEDLE) ×1 IMPLANT
NDL SPNL 18GX3.5 QUINCKE PK (NEEDLE) ×1 IMPLANT
NEEDLE HYPO 18GX1.5 SHARP (NEEDLE) ×2
NEEDLE SPNL 18GX3.5 QUINCKE PK (NEEDLE) ×2 IMPLANT
PACK ANTERIOR HIP CUSTOM (KITS) ×2 IMPLANT
PENCIL SMOKE EVACUATOR (MISCELLANEOUS) IMPLANT
PIN SECTOR W/GRIP ACE CUP 52MM (Hips) ×1 IMPLANT
SAW OSC TIP CART 19.5X105X1.3 (SAW) ×2 IMPLANT
SEALER BIPOLAR AQUA 6.0 (INSTRUMENTS) ×2 IMPLANT
SET HNDPC FAN SPRY TIP SCT (DISPOSABLE) ×1 IMPLANT
STEM TRI LOC BPS SZ3 W GRIPTON IMPLANT
SUT MNCRL AB 3-0 PS2 18 (SUTURE) ×2 IMPLANT
SUT MNCRL AB 4-0 PS2 18 (SUTURE) ×2 IMPLANT
SUT MON AB 2-0 CT1 36 (SUTURE) ×4 IMPLANT
SUT STRATAFIX 0 PDS 27 VIOLET (SUTURE) ×2
SUT STRATAFIX PDO 1 14 VIOLET (SUTURE) ×2
SUT STRATFX PDO 1 14 VIOLET (SUTURE) ×1
SUT VIC AB 2-0 CT1 27 (SUTURE) ×2
SUT VIC AB 2-0 CT1 TAPERPNT 27 (SUTURE) ×1 IMPLANT
SUTURE STRATFX 0 PDS 27 VIOLET (SUTURE) IMPLANT
SUTURE STRATFX PDO 1 14 VIOLET (SUTURE) ×1 IMPLANT
SYR 3ML LL SCALE MARK (SYRINGE) ×2 IMPLANT
TRAY FOLEY MTR SLVR 16FR STAT (SET/KITS/TRAYS/PACK) IMPLANT
TRI LOC BPS SZ 3 W GRIPTON ×2 IMPLANT
TUBE SUCTION HIGH CAP CLEAR NV (SUCTIONS) ×2 IMPLANT
WATER STERILE IRR 1000ML POUR (IV SOLUTION) ×2 IMPLANT

## 2020-11-30 NOTE — Anesthesia Procedure Notes (Addendum)
Spinal  Patient location during procedure: OR Start time: 11/30/2020 7:55 AM End time: 11/30/2020 8:01 AM Reason for block: surgical anesthesia Staffing Performed: resident/CRNA  Anesthesiologist: Barnet Glasgow, MD Resident/CRNA: Myna Bright, CRNA Preanesthetic Checklist Completed: patient identified, IV checked, site marked, risks and benefits discussed, surgical consent, monitors and equipment checked, pre-op evaluation and timeout performed Spinal Block Patient position: sitting Prep: DuraPrep Patient monitoring: heart rate, cardiac monitor, continuous pulse ox and blood pressure Approach: midline Location: L3-4 Injection technique: single-shot Needle Needle type: Pencan  Needle gauge: 24 G Needle length: 10 cm Needle insertion depth: 6 cm Assessment Sensory level: T4 Events: CSF return

## 2020-11-30 NOTE — Anesthesia Postprocedure Evaluation (Signed)
Anesthesia Post Note  Patient: [name]  Procedure(s) Performed: TOTAL HIP ARTHROPLASTY ANTERIOR APPROACH (Left: Hip)     Patient location during evaluation: Nursing Unit Anesthesia Type: Spinal Level of consciousness: oriented and awake and alert Pain management: pain level controlled Vital Signs Assessment: post-procedure vital signs reviewed and stable Respiratory status: spontaneous breathing and respiratory function stable Cardiovascular status: blood pressure returned to baseline and stable Postop Assessment: no headache, no backache, no apparent nausea or vomiting and patient able to bend at knees Anesthetic complications: no   No notable events documented.  Last Vitals:  Vitals:   11/30/20 1130 11/30/20 1202  BP: 112/60 (!) 103/57  Pulse: (!) 52 (!) 50  Resp: 16 16  Temp:  36.7 C  SpO2: 94% 96%    Last Pain:  Vitals:   11/30/20 1325  TempSrc:   PainSc: 7                  Barnet Glasgow

## 2020-11-30 NOTE — Care Plan (Signed)
Ortho Bundle Case Management Note  Patient Details  Name: [name] MRN: [id number] Date of Birth: [birth year]                  L THA on 11-30-20 DCP: Home with husband and sister. 1 story home with 6 ste. DME: No needs. Has a RW and 3-in-1. PT: HEP   DME Arranged:  N/A DME Agency:     HH Arranged:    [place] Agency:     Additional Comments: Please contact me with any questions of if this plan should need to change.  Marianne Sofia, RN,CCM [facility]  [phone] 11/30/2020, 8:47 AM

## 2020-11-30 NOTE — Discharge Instructions (Signed)
? ?Dr. Joselinne Lawal ?Joint Replacement Specialist ?[facility] ?[address] ?[place], [place] [address] ?[phone] ? ? ?TOTAL HIP REPLACEMENT POSTOPERATIVE DIRECTIONS ? ? ? ?Hip Rehabilitation, Guidelines Following Surgery  ? ?WEIGHT BEARING ?Weight bearing as tolerated with assist device (walker, cane, etc) as directed, use it as long as suggested by your surgeon or therapist, typically at least 4-6 weeks. ? ?The results of a hip operation are greatly improved after range of motion and muscle strengthening exercises. Follow all safety measures which are given to protect your hip. If any of these exercises cause increased pain or swelling in your joint, decrease the amount until you are comfortable again. Then slowly increase the exercises. Call your caregiver if you have problems or questions.  ? ?HOME CARE INSTRUCTIONS  ?Most of the following instructions are designed to prevent the dislocation of your new hip.  ?Remove items at home which could result in a fall. This includes throw rugs or furniture in walking pathways.  ?Continue medications as instructed at time of discharge. ?You may have some home medications which will be placed on hold until you complete the course of blood thinner medication. ?You may start showering once you are discharged home. Do not remove your dressing. ?Do not put on socks or shoes without following the instructions of your caregivers.   ?Sit on chairs with arms. Use the chair arms to help push yourself up when arising.  ?Arrange for the use of a toilet seat elevator so you are not sitting low.  ?Walk with walker as instructed.  ?You may resume a sexual relationship in one month or when given the OK by your caregiver.  ?Use walker as long as suggested by your caregivers.  ?You may put full weight on your legs and walk as much as is comfortable. ?Avoid periods of inactivity such as sitting longer than an hour when not asleep. This helps prevent blood  clots.  ?You may return to work once you are cleared by your surgeon.  ?Do not drive a car for 6 weeks or until released by your surgeon.  ?Do not drive while taking narcotics.  ?Wear elastic stockings for two weeks following surgery during the day but you may remove then at night.  ?Make sure you keep all of your appointments after your operation with all of your doctors and caregivers. You should call the office at the above phone number and make an appointment for approximately two weeks after the date of your surgery. ?Please pick up a stool softener and laxative for home use as long as you are requiring pain medications. ?ICE to the affected hip every three hours for 30 minutes at a time and then as needed for pain and swelling. Continue to use ice on the hip for pain and swelling from surgery. You may notice swelling that will progress down to the foot and ankle.  This is normal after surgery.  Elevate the leg when you are not up walking on it.   ?It is important for you to complete the blood thinner medication as prescribed by your doctor. ?Continue to use the breathing machine which will help keep your temperature down.  It is common for your temperature to cycle up and down following surgery, especially at night when you are not up moving around and exerting yourself.  The breathing machine keeps your lungs expanded and your temperature down. ? ?RANGE OF MOTION AND STRENGTHENING EXERCISES  ?These exercises are designed to help you   keep full movement of your hip joint. Follow your caregiver's or physical therapist's instructions. Perform all exercises about fifteen times, three times per day or as directed. Exercise both hips, even if you have had only one joint replacement. These exercises can be done on a training (exercise) mat, on the floor, on a table or on a bed. Use whatever works the best and is most comfortable for you. Use music or television while you are exercising so that the exercises are a  pleasant break in your day. This will make your life better with the exercises acting as a break in routine you can look forward to.  ?Lying on your back, slowly slide your foot toward your buttocks, raising your knee up off the floor. Then slowly slide your foot back down until your leg is straight again.  ?Lying on your back spread your legs as far apart as you can without causing discomfort.  ?Lying on your side, raise your upper leg and foot straight up from the floor as far as is comfortable. Slowly lower the leg and repeat.  ?Lying on your back, tighten up the muscle in the front of your thigh (quadriceps muscles). You can do this by keeping your leg straight and trying to raise your heel off the floor. This helps strengthen the largest muscle supporting your knee.  ?Lying on your back, tighten up the muscles of your buttocks both with the legs straight and with the knee bent at a comfortable angle while keeping your heel on the floor.  ? ?SKILLED REHAB INSTRUCTIONS: ?If the patient is transferred to a skilled rehab facility following release from the hospital, a list of the current medications will be sent to the facility for the patient to continue.  When discharged from the skilled rehab facility, please have the facility set up the patient's Home Health Physical Therapy prior to being released. Also, the skilled facility will be responsible for providing the patient with their medications at time of release from the facility to include their pain medication and their blood thinner medication. If the patient is still at the rehab facility at time of the two week follow up appointment, the skilled rehab facility will also need to assist the patient in arranging follow up appointment in our office and any transportation needs. ? ?POST-OPERATIVE OPIOID TAPER INSTRUCTIONS: ?It is important to wean off of your opioid medication as soon as possible. If you do not need pain medication after your surgery it is ok  to stop day one. ?Opioids include: ?Codeine, Hydrocodone(Norco, Vicodin), Oxycodone(Percocet, oxycontin) and hydromorphone amongst others.  ?Long term and even short term use of opiods can cause: ?Increased pain response ?Dependence ?Constipation ?Depression ?Respiratory depression ?And more.  ?Withdrawal symptoms can include ?Flu like symptoms ?Nausea, vomiting ?And more ?Techniques to manage these symptoms ?Hydrate well ?Eat regular healthy meals ?Stay active ?Use relaxation techniques(deep breathing, meditating, yoga) ?Do Not substitute Alcohol to help with tapering ?If you have been on opioids for less than two weeks and do not have pain than it is ok to stop all together.  ?Plan to wean off of opioids ?This plan should start within one week post op of your joint replacement. ?Maintain the same interval or time between taking each dose and first decrease the dose.  ?Cut the total daily intake of opioids by one tablet each day ?Next start to increase the time between doses. ?The last dose that should be eliminated is the evening dose.  ? ? ?MAKE   SURE YOU:  ?Understand these instructions.  ?Will watch your condition.  ?Will get help right away if you are not doing well or get worse. ? ?Pick up stool softner and laxative for home use following surgery while on pain medications. ?Do not remove your dressing. ?The dressing is waterproof--it is OK to take showers. ?Continue to use ice for pain and swelling after surgery. ?Do not use any lotions or creams on the incision until instructed by your surgeon. ?Total Hip Protocol. ? ?

## 2020-11-30 NOTE — Interval H&P Note (Signed)
History and Physical Interval Note:  11/30/2020 7:43 AM  [name]  has presented today for surgery, with the diagnosis of Left hip osteoarthritis.  The various methods of treatment have been discussed with the patient and family. After consideration of risks, benefits and other options for treatment, the patient has consented to  Procedure(s) with comments: [place] (Left) - 178mn as a surgical intervention.  The patient's history has been reviewed, patient examined, no change in status, stable for surgery.  I have reviewed the patient's chart and labs.  Questions were answered to the patient's satisfaction.     [place]

## 2020-11-30 NOTE — Op Note (Signed)
OPERATIVE REPORT  SURGEON: Rod Can, MD   ASSISTANT: Cherlynn June, PA-C. Almira Coaster, RNFA.  PREOPERATIVE DIAGNOSIS: Left hip arthritis.   POSTOPERATIVE DIAGNOSIS: Left hip arthritis.   PROCEDURE: Left total hip arthroplasty, anterior approach.   IMPLANTS: DePuy Tri Lock stem, size 3, hi offset. DePuy Pinnacle Cup, size 52 mm. DePuy Altrx liner, size 32 by 52 mm, neutral. DePuy Biolox ceramic head ball, size 32 + 5 mm.  ANESTHESIA:  MAC and Spinal  ESTIMATED BLOOD LOSS:-300 mL    ANTIBIOTICS: 2g Ancef.  DRAINS: None.  COMPLICATIONS: None.   CONDITION: PACU - hemodynamically stable.   BRIEF CLINICAL NOTE: [name] is a 60 y.o. female with a long-standing history of Left hip arthritis. After failing conservative management, the patient was indicated for total hip arthroplasty. The risks, benefits, and alternatives to the procedure were explained, and the patient elected to proceed.  PROCEDURE IN DETAIL: Surgical site was marked by myself in the pre-op holding area. Once inside the operating room, spinal anesthesia was obtained, and a foley catheter was inserted. The patient was then positioned on the Hana table.  All bony prominences were well padded.  The hip was prepped and draped in the normal sterile surgical fashion.  A time-out was called verifying side and site of surgery. The patient received IV antibiotics within 60 minutes of beginning the procedure.   Bikini incision was created three finger breadths distal to the ASIS, taking care to stay lateral to the medial border of the ASIS. The direct anterior approach to the hip was performed through the Hueter interval.  Lateral femoral circumflex vessels were treated with the Auqumantys. The anterior capsule was exposed and an inverted T capsulotomy was made. The femoral neck cut was made to the level of the templated cut.  A corkscrew was placed into the head and the head was removed.  The femoral head was  found to have eburnated bone. The head was passed to the back table and was measured. Pubofemoral ligament was released off of the calcar, taking care to stay on bone. Superior capsule was released from the greater trochanter, taking care to stay lateral to the posterior border of the femoral neck in order to preserve the short external rotators.   Acetabular exposure was achieved, and the pulvinar and labrum were excised. Sequential reaming of the acetabulum was then performed up to a size 51 mm reamer under direct visulization. A 52 mm cup was then opened and impacted into place at approximately 40 degrees of abduction and 20 degrees of anteversion. The final polyethylene liner was impacted into place and acetabular osteophytes were removed.    I then gained femoral exposure taking care to protect the abductors and greater trochanter.  This was performed using standard external rotation, extension, and adduction.  A cookie cutter was used to enter the femoral canal, and then the femoral canal finder was placed.  Sequential broaching was performed up to a size 3.  Calcar planer was used on the femoral neck remnant.  I placed a hi offset neck and a trial head ball.  The hip was reduced.  Leg lengths and offset were checked fluoroscopically.  The hip was dislocated and trial components were removed.  The final implants were placed, and the hip was reduced.  Fluoroscopy was used to confirm component position and leg lengths.  At 90 degrees of external rotation and full extension, the hip was stable to an anterior directed force.   The wound was copiously  irrigated with Irrisept solution and normal saline using pule lavage.  Marcaine solution was injected into the periarticular soft tissue.  The wound was closed in layers using #1 Stratafix for the fascia, 2-0 Vicryl for the subcutaneous fat, 2-0 Monocryl for the deep dermal layer, 3-0 running Monocryl subcuticular stitch, and Dermabond for the skin.  Once the  glue was fully dried, an Aquacell Ag dressing was applied.  The patient was transported to the recovery room in stable condition.  Sponge, needle, and instrument counts were correct at the end of the case x2.  The patient tolerated the procedure well and there were no known complications.  Please note that a surgical assistant was a medical necessity for this procedure to perform it in a safe and expeditious manner. Assistant was necessary to provide appropriate retraction of vital neurovascular structures, to prevent femoral fracture, and to allow for anatomic placement of the prosthesis.

## 2020-11-30 NOTE — Transfer of Care (Signed)
Immediate Anesthesia Transfer of Care Note  Patient: [name]   Procedure(s) Performed: TOTAL HIP ARTHROPLASTY ANTERIOR APPROACH (Left: Hip)  Patient Location: PACU  Anesthesia Type:MAC and Spinal  Level of Consciousness: awake, alert , oriented and patient cooperative  Airway & Oxygen Therapy: Patient Spontanous Breathing  Post-op Assessment: Report given to RN and Post -op Vital signs reviewed and stable  Post vital signs: Reviewed and stable  Last Vitals:  Vitals Value Taken Time  BP    Temp    Pulse 57 11/30/20 1008  Resp 15 11/30/20 1008  SpO2 98 % 11/30/20 1008  Vitals shown include unvalidated device data.  Last Pain:  Vitals:   11/30/20 0554  TempSrc:   PainSc: 3       Patients Stated Pain Goal: 5 (26/37/85 8850)  Complications: No notable events documented.

## 2020-11-30 NOTE — Anesthesia Postprocedure Evaluation (Signed)
Anesthesia Post Note  Patient: [name]  Procedure(s) Performed: TOTAL HIP ARTHROPLASTY ANTERIOR APPROACH (Left: Hip)     Patient location during evaluation: Endoscopy Anesthesia Type: Spinal Level of consciousness: awake and alert Pain management: pain level controlled Vital Signs Assessment: post-procedure vital signs reviewed and stable Respiratory status: spontaneous breathing, nonlabored ventilation, respiratory function stable and patient connected to nasal cannula oxygen Cardiovascular status: blood pressure returned to baseline and stable Postop Assessment: no apparent nausea or vomiting Anesthetic complications: no   No notable events documented.  Last Vitals:  Vitals:   11/30/20 1130 11/30/20 1202  BP: 112/60 (!) 103/57  Pulse: (!) 52 (!) 50  Resp: 16 16  Temp:  36.7 C  SpO2: 94% 96%    Last Pain:  Vitals:   11/30/20 1325  TempSrc:   PainSc: 7                  Barnet Glasgow

## 2020-11-30 NOTE — Evaluation (Signed)
Physical Therapy Evaluation Patient Details Name: [name] MRN: [id number] DOB: 03-13-1961 Today's Date: 11/30/2020   History of Present Illness   Patient is 60 y.o. female s/p Lt THA anterior approach on 11/30/20 with PMH significant for OA, ADD, anxiety, depression, fibromyalgia, HTN, HLD, hypothyroidism, COPD, bladder cancer, Rt ankle fusion, ACDF.    Clinical Impression  [name] is a 60 y.o. female POD 0 s/p Lt THA. Patient reports independence with mobility at baseline. Patient is now limited by functional impairments (see PT problem list below) and requires min assist for transfers and gait with RW. Patient was able to ambulate ~60 feet with RW and min assist. Patient instructed in exercise to facilitate circulation to manage edema and reduce risk of DVT. Patient will benefit from continued skilled PT interventions to address impairments and progress towards PLOF. Acute PT will follow to progress mobility and stair training in preparation for safe discharge home.        11/30/20 1400  PT Visit Information  Last PT Received On 11/30/20  Assistance Needed +1  History of Present Illness Patient is 60 y.o. female s/p Lt THA anterior approach on 11/30/20 with PMH significant for OA, ADD, anxiety, depression, fibromyalgia, HTN, HLD, hypothyroidism, COPD, bladder cancer, Rt ankle fusion, ACDF.  Precautions  Precautions Fall  Precaution Comments pt had a fall about 3 weeks ago, landed on Rt elbow and has had pain raising arm ever since  Restrictions  Weight Bearing Restrictions No  LLE Weight Bearing WBAT  Home Living  Family/patient expects to be discharged to: Private residence  Living Arrangements Spouse/significant other  Available Help at Discharge Family  Type of [place] to enter;Elevator  Entrance Stairs-Number of Steps [place] One level  Bathroom Shower/Tub Walk-in shower  Bathroom Toilet Handicapped height  [place] - 2 wheels;Walker - 4 wheels;BSC;Shower seat - built in;Walker - standard;Cane - single point  Prior Function  Level of Independence Independent  Communication  Communication No difficulties  Pain Assessment  Pain Assessment 0-10  Pain Score 7  Pain Location Lt hip  Pain Descriptors / Indicators Aching;Discomfort  Pain Intervention(s) Limited activity within patient's tolerance;Monitored during session;Repositioned  Cognition  Arousal/Alertness Awake/alert  Behavior During Therapy WFL for tasks assessed/performed  Overall Cognitive Status Within Functional Limits for tasks assessed  Upper Extremity Assessment  Upper Extremity Assessment Overall WFL for tasks assessed;RUE deficits/detail  RUE Deficits / Details pt reports recent fall ~3 wks ago while at water park. pt slipped and landed on side. reports she has had pain at elbow with raising UE overhead. Gross testing reveals shoulder flexion limited to ~90 degrees, pain with resistance to wrist flex/ext, forearm pronation/supination, and shoulder flexion. strength overall 4/5.  RUE Sensation WNL  RUE Coordination WNL  Lower Extremity Assessment  Lower Extremity Assessment Generalized weakness;LLE deficits/detail  LLE Unable to fully assess due to pain  LLE Sensation WNL  LLE Coordination WNL  Cervical / Trunk Assessment  Cervical / Trunk Assessment Normal  Bed Mobility  Overal bed mobility Needs Assistance  Bed Mobility Supine to Sit  Supine to sit Min assist;HOB elevated  General bed mobility comments cues for sequencing and use of bed rail to press up trunk, assist needed to bring LE's off EOB.  Transfers  Overall transfer level Needs assistance  Equipment used Rolling walker (2 wheeled)  Transfers Sit to/from Stand  Sit to Stand Min assist  General transfer comment cues  for hand placement/technique and assist for power up from EOB. pt required encouragement to initiate transfer.  Ambulation/Gait   Ambulation/Gait assistance Min assist;Min guard  Gait Distance (Feet) 60 Feet  Assistive device Rolling walker (2 wheeled)  Gait Pattern/deviations Step-to pattern;Decreased stride length;Antalgic;Decreased stance time - left;Decreased weight shift to left  General Gait Details cues for step to pattern and proximity to RW, no overt LOB throughout. pt required assist initially for position and cues to keep walker on ground rather than lift walker up. pt denited pain in Rt shoulder/UE with use of RW.  Gait velocity decr  Balance  Overall balance assessment Needs assistance  Sitting-balance support Feet supported  Sitting balance-Leahy Scale Good  Standing balance support During functional activity;Bilateral upper extremity supported  Standing balance-Leahy Scale Fair  Exercises  Exercises Total Joint  Total Joint Exercises  Ankle Circles/Pumps AROM;Both;20 reps;Seated  PT - End of Session  Equipment Utilized During Treatment Gait belt  Activity Tolerance Patient tolerated treatment well  Patient left in chair;with call bell/phone within reach;with chair alarm set  Nurse Communication Mobility status  PT Assessment  PT Recommendation/Assessment Patient needs continued PT services  PT Visit Diagnosis Muscle weakness (generalized) (M62.81);Difficulty in walking, not elsewhere classified (R26.2)  PT Problem List Decreased strength;Decreased range of motion;Decreased balance;Decreased mobility;Decreased activity tolerance;Decreased knowledge of use of DME;Decreased knowledge of precautions;Pain  PT Plan  PT Frequency (ACUTE ONLY) 7X/week  PT Treatment/Interventions (ACUTE ONLY) DME instruction;Gait training;Stair training;Functional mobility training;Therapeutic activities;Therapeutic exercise;Balance training;Patient/family education  AM-PAC PT "6 Clicks" Mobility Outcome Measure (Version 2)  Help needed turning from your back to your side while in a flat bed without using bedrails? 3  Help  needed moving from lying on your back to sitting on the side of a flat bed without using bedrails? 3  Help needed moving to and from a bed to a chair (including a wheelchair)? 3  Help needed standing up from a chair using your arms (e.g., wheelchair or bedside chair)? 3  Help needed to walk in hospital room? 3  Help needed climbing 3-5 steps with a railing?  2  6 Click Score 17  Consider Recommendation of Discharge To: Home with [facility]  Progressive Mobility  What is the highest level of mobility based on the progressive mobility assessment? Level 5 (Walks with assist in room/hall) - Balance while stepping forward/back and can walk in room with assist - Complete  Mobility Ambulated with assistance in hallway  PT Recommendation  Recommendations for Other Services OT consult (Rt shulder/elbow pain)  Follow Up Recommendations Follow surgeon's recommendation for DC plan and follow-up therapies;Home health PT  PT equipment None recommended by PT  Individuals Consulted  Consulted and Agree with Results and Recommendations Patient  Acute Rehab PT Goals  Patient Stated Goal stop hurting  PT Goal Formulation With patient  Time For Goal Achievement 12/07/20  Potential to Achieve Goals Good  PT Time Calculation  PT Start Time (ACUTE ONLY) 1406  PT Stop Time (ACUTE ONLY) 1443  PT Time Calculation (min) (ACUTE ONLY) 37 min  PT General Charges  $$ ACUTE PT VISIT 1 Visit  PT Evaluation  $PT Eval Low Complexity 1 Low  PT Treatments  $Gait Training 8-22 mins  Written Expression  Dominant Hand Right     Verner Mould, DPT Acute Rehabilitation Services Office [phone] Pager [phone]   Jacques Navy 11/30/2020, 6:29 PM

## 2020-11-30 NOTE — Plan of Care (Signed)
  Problem: Activity: Goal: Ability to avoid complications of mobility impairment will improve Outcome: Progressing   Problem: Clinical Measurements: Goal: Postoperative complications will be avoided or minimized Outcome: Progressing   Problem: Pain Management: Goal: Pain level will decrease with appropriate interventions Outcome: Progressing   Problem: Clinical Measurements: Goal: Respiratory complications will improve Outcome: Progressing   Problem: Clinical Measurements: Goal: Cardiovascular complication will be avoided Outcome: Progressing

## 2020-11-30 NOTE — Anesthesia Procedure Notes (Signed)
Spinal  Patient location during procedure: OR Start time: 11/30/2020 7:52 AM End time: 11/30/2020 8:01 AM Reason for block: surgical anesthesia Staffing Anesthesiologist: Barnet Glasgow, MD Preanesthetic Checklist Completed: patient identified, IV checked, site marked, risks and benefits discussed, surgical consent, monitors and equipment checked, pre-op evaluation and timeout performed Spinal Block Patient position: sitting Prep: DuraPrep Patient monitoring: heart rate, cardiac monitor, continuous pulse ox and blood pressure Approach: midline Location: L3-4 Injection technique: single-shot Needle Needle type: Sprotte  Needle gauge: 24 G Needle length: 9 cm Needle insertion depth: 7 cm Assessment Sensory level: T4 Events: CSF return Additional Notes 1 Attempt pt tolerated procedure well

## 2020-12-01 ENCOUNTER — Observation Stay (HOSPITAL_COMMUNITY): Payer: PPO

## 2020-12-01 DIAGNOSIS — M79631 Pain in right forearm: Secondary | ICD-10-CM | POA: Diagnosis not present

## 2020-12-01 DIAGNOSIS — M1612 Unilateral primary osteoarthritis, left hip: Secondary | ICD-10-CM | POA: Diagnosis not present

## 2020-12-01 DIAGNOSIS — M79601 Pain in right arm: Secondary | ICD-10-CM | POA: Diagnosis not present

## 2020-12-01 LAB — CBC
HCT: 34.6 % — ABNORMAL LOW (ref 36.0–46.0)
Hemoglobin: 11 g/dL — ABNORMAL LOW (ref 12.0–15.0)
MCH: 30.5 pg (ref 26.0–34.0)
MCHC: 31.8 g/dL (ref 30.0–36.0)
MCV: 95.8 fL (ref 80.0–100.0)
Platelets: 192 10*3/uL (ref 150–400)
RBC: 3.61 MIL/uL — ABNORMAL LOW (ref 3.87–5.11)
RDW: 13 % (ref 11.5–15.5)
WBC: 9.9 10*3/uL (ref 4.0–10.5)
nRBC: 0 % (ref 0.0–0.2)

## 2020-12-01 LAB — BASIC METABOLIC PANEL
Anion gap: 2 — ABNORMAL LOW (ref 5–15)
BUN: 21 mg/dL — ABNORMAL HIGH (ref 6–20)
CO2: 31 mmol/L (ref 22–32)
Calcium: 8.4 mg/dL — ABNORMAL LOW (ref 8.9–10.3)
Chloride: 109 mmol/L (ref 98–111)
Creatinine, Ser: 0.74 mg/dL (ref 0.44–1.00)
GFR, Estimated: >60 mL/min (ref >60)
Glucose, Bld: 122 mg/dL — ABNORMAL HIGH (ref 70–99)
Potassium: 5 mmol/L (ref 3.5–5.1)
Sodium: 142 mmol/L (ref 135–145)

## 2020-12-01 MED ORDER — SENNA 8.6 MG PO TABS: 1.0000 | ORAL_TABLET | Freq: Two times a day (BID) | ORAL | 0 refills | Status: DC

## 2020-12-01 MED ORDER — ASPIRIN 81 MG PO CHEW: 81.0000 mg | CHEWABLE_TABLET | Freq: Two times a day (BID) | ORAL | Status: AC

## 2020-12-01 MED ORDER — DOCUSATE SODIUM 100 MG PO CAPS: 100.0000 mg | ORAL_CAPSULE | Freq: Two times a day (BID) | ORAL | 0 refills | Status: AC

## 2020-12-01 MED ORDER — ONDANSETRON HCL 4 MG PO TABS: 4.0000 mg | ORAL_TABLET | Freq: Four times a day (QID) | ORAL | 0 refills | Status: DC | PRN

## 2020-12-01 MED ORDER — OXYCODONE HCL 5 MG PO TABS: 5.0000 mg | ORAL_TABLET | ORAL | 0 refills | Status: AC | PRN

## 2020-12-01 MED ORDER — ACETAMINOPHEN 325 MG PO TABS: 325.0000 mg | ORAL_TABLET | Freq: Four times a day (QID) | ORAL | Status: DC | PRN

## 2020-12-01 NOTE — TOC Transition Note (Signed)
Transition of Care [facility]) - CM/SW Discharge Note   Patient Details  Name: [name] MRN: [id number] Date of Birth: 18-Nov-1960  Transition of [facility]) CM/SW Contact:  Lennart Pall, LCSW Phone Number: 12/01/2020, 3:30 PM   Clinical Narrative:    Met with and confirming she has all needed DME.  Plan HEP.  No TOC needs.   Final next level of care: Home/Self Care Barriers to Discharge: No Barriers Identified   Patient Goals and CMS Choice Patient states their goals for this hospitalization and ongoing recovery are:: return home      Discharge Placement                       Discharge Plan and Services                DME Arranged: N/A                    Social Determinants of Health (SDOH) Interventions     Readmission Risk Interventions No flowsheet data found.

## 2020-12-01 NOTE — Progress Notes (Signed)
Physical Therapy Treatment Patient Details Name: [name] MRN: [id number] DOB: 09-15-1960 Today's Date: 12/01/2020    History of Present Illness Patient is 60 y.o. female s/p Lt THA anterior approach on 11/30/20 with PMH significant for OA, ADD, anxiety, depression, fibromyalgia, HTN, HLD, hypothyroidism, COPD, bladder cancer, Rt ankle fusion, ACDF.    PT Comments    Pt is POD # 1 and is progressing well.  Pt was unable to be seen earlier due to xrays pending on R UE - xrays back in pm and were negative.  Pt reporting nausea during session but still motivated to do therapy and return home.  She ambulated 180' with RW and supervision and does not have to do steps at her home. Pt demonstrates safe gait & transfers in order to return home from PT perspective once discharged by MD.  While in hospital, will continue to benefit from PT for skilled therapy to advance mobility and exercises.      Follow Up Recommendations  Follow surgeon's recommendation for DC plan and follow-up therapies;Home health PT     Equipment Recommendations  None recommended by PT    Recommendations for Other Services       Precautions / Restrictions Precautions Precautions: Fall Precaution Comments: pt had a fall about 3 weeks ago (at water park), landed on Rt elbow and has had pain raising arm ever since. Xrays negative Restrictions LLE Weight Bearing: Weight bearing as tolerated    Mobility  Bed Mobility Overal bed mobility: Needs Assistance Bed Mobility: Supine to Sit;Sit to Supine     Supine to sit: Min guard Sit to supine: Min guard   General bed mobility comments: Use of rail but able to do without physical assist    Transfers Overall transfer level: Needs assistance Equipment used: Rolling walker (2 wheeled) Transfers: Sit to/from Stand Sit to Stand: Supervision         General transfer comment: Demonstrated safe sit to stand  Ambulation/Gait Ambulation/Gait assistance: Min  guard;Supervision Gait Distance (Feet): 180 Feet Assistive device: Rolling walker (2 wheeled) Gait Pattern/deviations: Step-to pattern;Decreased stride length Gait velocity: decr   General Gait Details: Min guard progressed to supervision.  Good use of RW today.  Pt with slight antalgic gait on R but overall progressing well and no overt LOB   Stairs Stairs:  (Pt has elevator)           Wheelchair Mobility    Modified Rankin (Stroke Patients Only)       Balance Overall balance assessment: Needs assistance Sitting-balance support: Feet supported Sitting balance-Leahy Scale: Good     Standing balance support: During functional activity;Bilateral upper extremity supported;No upper extremity supported Standing balance-Leahy Scale: Fair Standing balance comment: Able to static stand without UE support; RW to ambulate                            Cognition Arousal/Alertness: Awake/alert Behavior During Therapy: WFL for tasks assessed/performed Overall Cognitive Status: Within Functional Limits for tasks assessed                                 General Comments: Pt keeping eyes closed but reports due to nausea      Exercises Total Joint Exercises Ankle Circles/Pumps: AROM;Both;10 reps;Supine Quad Sets: AROM;Both;10 reps;Supine Heel Slides: AAROM;Left;10 reps;Supine (gait belt for min AAROM) Hip ABduction/ADduction: AAROM;Supine;Left;10 reps (gait belt for min AAROM) Long  Arc Quad: AROM;Left;10 reps;Seated    General Comments General comments (skin integrity, edema, etc.): VSS.  Pt reports nauseated from medication but denies lightheadedness or dizziness.  REports wants to go home.  Educated on safe ice use, no pivots, car transfers. Also, encouraged walking every 1-2 hours during day. Educated on HEP with focus on mobility the first weeks. Discussed doing exercises within pain control and if pain increasing could decreased ROM, reps, and stop  exercises as needed. Encouraged to perform quad sets and ankle pumps frequently for blood flow and to promote full knee extension.       Pertinent Vitals/Pain Pain Assessment: 0-10 Pain Score: 5  Pain Location: headache Pain Descriptors / Indicators: Aching Pain Intervention(s): Limited activity within patient's tolerance;Monitored during session;Premedicated before session;Repositioned;Ice applied    Home Living                      Prior Function            PT Goals (current goals can now be found in the care plan section) Acute Rehab PT Goals Patient Stated Goal: go home PT Goal Formulation: With patient Time For Goal Achievement: 12/07/20 Potential to Achieve Goals: Good Progress towards PT goals: Progressing toward goals    Frequency    7X/week      PT Plan Current plan remains appropriate    Co-evaluation              AM-PAC PT "6 Clicks" Mobility   Outcome Measure  Help needed turning from your back to your side while in a flat bed without using bedrails?: A Little Help needed moving from lying on your back to sitting on the side of a flat bed without using bedrails?: A Little Help needed moving to and from a bed to a chair (including a wheelchair)?: A Little Help needed standing up from a chair using your arms (e.g., wheelchair or bedside chair)?: A Little Help needed to walk in hospital room?: A Little Help needed climbing 3-5 steps with a railing? : A Little 6 Click Score: 18    End of Session Equipment Utilized During Treatment: Gait belt Activity Tolerance: Patient tolerated treatment well Patient left: with call bell/phone within reach;in bed;with family/visitor present;with bed alarm set;with SCD's reapplied Nurse Communication: Mobility status PT Visit Diagnosis: Muscle weakness (generalized) (M62.81);Difficulty in walking, not elsewhere classified (R26.2)     Time: [id number] PT Time Calculation (min) (ACUTE ONLY): 36  min  Charges:  $Gait Training: 8-22 mins $Therapeutic Exercise: 8-22 mins                     Abran Richard, PT Acute Rehab Services Pager [phone] [facility] 12/01/2020, 4:05 PM

## 2020-12-01 NOTE — Evaluation (Signed)
Occupational Therapy Evaluation Patient Details Name: [name] MRN: [id number] DOB: 07-05-1960 Today's Date: 12/01/2020    History of Present Illness Patient is 60 y.o. female s/p Lt THA anterior approach on 11/30/20 with PMH significant for OA, ADD, anxiety, depression, fibromyalgia, HTN, HLD, hypothyroidism, COPD, bladder cancer, Rt ankle fusion, ACDF.   Clinical Impression    Patient lives at home with spouse, is typically independent with self care tasks and no use of adaptive equipment. Patient reports falling twice onto R arm, most recent 3 weeks ago limiting functional use due to pain in elbow, shoulder with mobility. Per RN supposed to have XR of arm, instructed patient to minimize weight bearing in R arm during session. Patient also appearing groggy, reports she does not take medication at home and has been medicated for pain this AM. Overall patient min A for functional transfers to 3 in 1 then recliner for safety and moderate multimodal cues for hand placement/sequencing with walker. Patient mod A for lower body self care as she was unable to perform dressing task on L leg due to pain. May benefit from use of adaptive equipment during recovery. Acute OT to follow during acute care stay in order to facilitate D/C home with family support.     Follow Up Recommendations  Supervision/Assistance - 24 hour;Follow surgeon's recommendation for DC plan and follow-up therapies (at least initial)    Equipment Recommendations  Other (comment) (possibly long handle AE)       Precautions / Restrictions Precautions Precautions: Fall Precaution Comments: pt had a fall about 3 weeks ago, landed on Rt elbow and has had pain raising arm ever since. Per nursing XR for R UE ordered 8/26 Restrictions Weight Bearing Restrictions: No LLE Weight Bearing: Weight bearing as tolerated      Mobility Bed Mobility Overal bed mobility: Needs Assistance Bed Mobility: Supine to Sit     Supine to sit:  HOB elevated;Min guard     General bed mobility comments: min G for safety, cue not to push/pull with R UE    Transfers Overall transfer level: Needs assistance Equipment used: Rolling walker (2 wheeled) Transfers: Sit to/from [facility] Sit to Stand: Min assist Stand pivot transfers: Min assist       General transfer comment: please see toilet transfer in ADL section    Balance Overall balance assessment: Needs assistance Sitting-balance support: Feet supported Sitting balance-Leahy Scale: Good     Standing balance support: During functional activity;Bilateral upper extremity supported Standing balance-Leahy Scale: Poor Standing balance comment: static stand at commode for peri care with unilateral UE support                           ADL either performed or assessed with clinical judgement   ADL Overall ADL's : Needs assistance/impaired Eating/Feeding: Independent;Sitting Eating/Feeding Details (indicate cue type and reason): to drink from cup Grooming: Set up;Sitting   Upper Body Bathing: Minimal assistance;Sitting Upper Body Bathing Details (indicate cue type and reason): R arm pain Lower Body Bathing: Moderate assistance;Sitting/lateral leans;Sit to/from stand Lower Body Bathing Details (indicate cue type and reason): L LE pain Upper Body Dressing : Minimal assistance;Sitting Upper Body Dressing Details (indicate cue type and reason): R arm pain Lower Body Dressing: Moderate assistance;Sitting/lateral leans;Sit to/from stand Lower Body Dressing Details (indicate cue type and reason): patient demonstrates ability to pull up R sock, unable L due to pain. may benefit from adaptive equipment while healing to maximize independence  with LB ADLs Toilet Transfer: Minimal assistance;Cueing for safety;Cueing for sequencing;Stand-pivot;BSC;RW Toilet Transfer Details (indicate cue type and reason): patient groggy from pain medications therefore  transferred to bedside commode vs ambulating to bathroom. needing moderate multimodal cues for body mechanics, min A for safety Toileting- Clothing Manipulation and Hygiene: Min guard;Sit to/from stand Toileting - Clothing Manipulation Details (indicate cue type and reason): for peri care after voiding     Functional mobility during ADLs: Minimal assistance;Rolling walker;Cueing for safety;Cueing for sequencing General ADL Comments: patient currently limited by L hip pain, R arm pain and activity tolerance as she is groggy from pain medications (let RN know) may benefit from AE for LB ADLs                         Pertinent Vitals/Pain Pain Assessment: Faces Faces Pain Scale: Hurts little more Pain Location: Lt hip Pain Descriptors / Indicators: Aching;Discomfort Pain Intervention(s): Premedicated before session     Hand Dominance Right   Extremity/Trunk Assessment Upper Extremity Assessment Upper Extremity Assessment: RUE deficits/detail RUE Deficits / Details: patient reports falling twice onto R arm, most recent 3 weeks ago. Reports pain in R elbow and shoulder with mobility however distal AROM appears intact as patient is able to drink from cup. instruct patient not to push from R UE until XR results in RUE Sensation: WNL RUE Coordination: WNL   Lower Extremity Assessment Lower Extremity Assessment: Defer to PT evaluation   Cervical / Trunk Assessment Cervical / Trunk Assessment: Normal   Communication Communication Communication: No difficulties   Cognition Arousal/Alertness: Awake/alert Behavior During Therapy: WFL for tasks assessed/performed Overall Cognitive Status: Within Functional Limits for tasks assessed                                 General Comments: patient is groggy due to pain medications this AM "I don't take pills"              Home Living Family/patient expects to be discharged to:: Private residence Living Arrangements:  Spouse/significant other Available Help at Discharge: Family Type of Home: House Home Access: Stairs to Probation officer of Steps: 8   Home Layout: One level     Bathroom Shower/Tub: Occupational psychologist: Handicapped height Bathroom Accessibility: Yes   Home Equipment: Environmental consultant - 2 wheels;Walker - 4 wheels;Bedside commode;Shower seat - built in;Walker - standard;Cane - single point          Prior Functioning/Environment Level of Independence: Independent                 OT Problem List: Decreased activity tolerance;Impaired balance (sitting and/or standing);Decreased safety awareness;Decreased knowledge of use of DME or AE;Pain;Impaired UE functional use      OT Treatment/Interventions: Self-care/ADL training;Therapeutic exercise;DME and/or AE instruction;Therapeutic activities;Patient/family education;Balance training    OT Goals(Current goals can be found in the care plan section) Acute Rehab OT Goals Patient Stated Goal: stop hurting OT Goal Formulation: With patient Time For Goal Achievement: 12/15/20 Potential to Achieve Goals: Good  OT Frequency: Min 2X/week    AM-PAC OT "6 Clicks" Daily Activity     Outcome Measure Help from another person eating meals?: None Help from another person taking care of personal grooming?: A Little Help from another person toileting, which includes using toliet, bedpan, or urinal?: A Little Help from another person bathing (including washing, rinsing, drying)?: A Lot  Help from another person to put on and taking off regular upper body clothing?: A Little Help from another person to put on and taking off regular lower body clothing?: A Lot 6 Click Score: 17   End of Session Equipment Utilized During Treatment: Rolling walker;Gait belt Nurse Communication: Mobility status;Other (comment) (groggy from pain medications)  Activity Tolerance: Patient tolerated treatment well Patient left: in chair;with  call bell/phone within reach;with chair alarm set  OT Visit Diagnosis: Other abnormalities of gait and mobility (R26.89);Pain Pain - Right/Left: Left Pain - part of body: Hip (R arm)                Time: 0727-0805 OT Time Calculation (min): 38 min Charges:  OT General Charges $OT Visit: 1 Visit OT Evaluation $OT Eval Low Complexity: 1 Low OT Treatments $Self Care/Home Management : 23-37 mins  Delbert Phenix OT OT pager: [place] 12/01/2020, 9:50 AM

## 2020-12-01 NOTE — Progress Notes (Signed)
    Subjective:  Patient reports pain as mild to moderate.  Denies N/V/CP/SOB.   Objective:   VITALS:   Vitals:   11/30/20 2115 12/01/20 0132 12/01/20 0556 12/01/20 0810  BP: 109/63 (!) 127/59 (!) 122/55   Pulse: 71 65 71   Resp: '18 18 16   '$ Temp: 97.9 F (36.6 C) 99.2 F (37.3 C) 98.6 F (37 C)   TempSrc: Oral Oral Oral   SpO2: 98% 98% 98% 93%  Weight:      Height:        NAD ABD soft Neurovascular intact Sensation intact distally Intact pulses distally Dorsiflexion/Plantar flexion intact Incision: dressing C/D/I   Lab Results  Component Value Date   WBC 9.9 12/01/2020   HGB 11.0 (L) 12/01/2020   HCT 34.6 (L) 12/01/2020   MCV 95.8 12/01/2020   PLT 192 12/01/2020   BMET    Component Value Date/Time   NA 142 12/01/2020 0332   K 5.0 12/01/2020 0332   CL 109 12/01/2020 0332   CO2 31 12/01/2020 0332   GLUCOSE 122 (H) 12/01/2020 0332   BUN 21 (H) 12/01/2020 0332   CREATININE 0.74 12/01/2020 0332   CREATININE 0.66 01/13/2017 1003   CALCIUM 8.4 (L) 12/01/2020 0332   GFRNONAA >60 12/01/2020 0332   GFRNONAA 98 01/13/2017 1003   GFRAA 114 01/13/2017 1003     Assessment/Plan: 1 Day Post-Op   Principal Problem:   Osteoarthritis of left hip   WBAT with walker DVT ppx: Aspirin, SCDs, TEDS PO pain control PT/OT Dispo: D/C home once cleared by therapy. Follow up in 2 weeks for incision check     Dorothyann Peng 12/01/2020, 8:48 AM  [facility] now [facility] [address] [place], [place], [place] [address] Phone: [phone] [email or web address] Facebook  [employer]

## 2020-12-04 ENCOUNTER — Encounter (HOSPITAL_COMMUNITY): Payer: Self-pay | Admitting: Orthopedic Surgery

## 2020-12-04 NOTE — Discharge Summary (Signed)
Physician Discharge Summary  Patient ID: [name] MRN: [id number] DOB/AGE: 09-Jul-1960 60 y.o.  Admit date: 11/30/2020 Discharge date: 12/01/2020  Admission Diagnoses:  Osteoarthritis of left hip  Discharge Diagnoses:  Principal Problem:   Osteoarthritis of left hip   Past Medical History:  Diagnosis Date   ADD (attention deficit disorder)    Anxiety    Arthritis    Bladder neoplasm    Cancer ([place]) 2015   Bladder cancer   COPD (chronic obstructive pulmonary disease) (HCC)    Depression    Fibroids    Fibromyalgia    H/O bronchitis    treated within the last week w steroids, antibx tessalon pearls.    H/O sebaceous cyst 07/18/2014   left breast    Hemorrhoid    History of melanoma excision    leg   Hyperlipidemia    Hypertension    normal now, only took medications for 1 month- thinks it was from pain.   Hypothyroidism    Iron deficiency anemia    PONV (postoperative nausea and vomiting)    used a scop patch last surg-did well   Pre-diabetes    Prediabetes    Restless leg syndrome    Sciatica of right side 10/13/2019   Shortness of breath dyspnea    with exertion   Shoulder impingement    right   SUI (stress urinary incontinence, female)    Wears glasses     Surgeries: Procedure(s): TOTAL HIP ARTHROPLASTY ANTERIOR APPROACH on 11/30/2020   Consultants (if any):   Discharged Condition: Improved  Hospital Course: [name] is an 60 y.o. female who was admitted 11/30/2020 with a diagnosis of Osteoarthritis of left hip and went to the operating room on 11/30/2020 and underwent the above named procedures.    She was given perioperative antibiotics:  Anti-infectives (From admission, onward)    Start     Dose/Rate Route Frequency Ordered Stop   11/30/20 1400  ceFAZolin (ANCEF) IVPB 2g/100 mL premix        2 g 200 mL/hr over 30 Minutes Intravenous Every 6 hours 11/30/20 1155 11/30/20 2133   11/30/20 0600  ceFAZolin (ANCEF) IVPB 2g/100 mL premix         2 g 200 mL/hr over 30 Minutes Intravenous On call to O.R. 11/30/20 [id number] 11/30/20 [id number]     .  She was given sequential compression devices, early ambulation, and aspirin for DVT prophylaxis.  She benefited maximally from the hospital stay and there were no complications.    Recent vital signs:  Vitals:   12/01/20 1013 12/01/20 1349  BP: (!) 121/47 (!) 128/57  Pulse: 66 64  Resp: 18 16  Temp: 97.7 F (36.5 C) 98.5 F (36.9 C)  SpO2: 92% 92%    Recent laboratory studies:  Lab Results  Component Value Date   HGB 11.0 (L) 12/01/2020   HGB 13.9 01/13/2017   HGB 13.9 06/30/2016   Lab Results  Component Value Date   WBC 9.9 12/01/2020   PLT 192 12/01/2020   Lab Results  Component Value Date   INR 1.03 01/17/2015   Lab Results  Component Value Date   NA 142 12/01/2020   K 5.0 12/01/2020   CL 109 12/01/2020   CO2 31 12/01/2020   BUN 21 (H) 12/01/2020   CREATININE 0.74 12/01/2020   GLUCOSE 122 (H) 12/01/2020     WEIGHT BEARING   Weight bearing as tolerated with assist device (walker, cane, etc) as directed, use it as long  as suggested by your surgeon or therapist, typically at least 4-6 weeks.   EXERCISES  Results after joint replacement surgery are often greatly improved when you follow the exercise, range of motion and muscle strengthening exercises prescribed by your doctor. Safety measures are also important to protect the joint from further injury. Any time any of these exercises cause you to have increased pain or swelling, decrease what you are doing until you are comfortable again and then slowly increase them. If you have problems or questions, call your caregiver or physical therapist for advice.   Rehabilitation is important following a joint replacement. After just a few days of immobilization, the muscles of the leg can become weakened and shrink (atrophy).  These exercises are designed to build up the tone and strength of the thigh and leg muscles and  to improve motion. Often times heat used for twenty to thirty minutes before working out will loosen up your tissues and help with improving the range of motion but do not use heat for the first two weeks following surgery (sometimes heat can increase post-operative swelling).   These exercises can be done on a training (exercise) mat, on the floor, on a table or on a bed. Use whatever works the best and is most comfortable for you.    Use music or television while you are exercising so that the exercises are a pleasant break in your day. This will make your life better with the exercises acting as a break in your routine that you can look forward to.   Perform all exercises about fifteen times, three times per day or as directed.  You should exercise both the operative leg and the other leg as well.  Exercises include:   Quad Sets - Tighten up the muscle on the front of the thigh (Quad) and hold for 5-10 seconds.   Straight Leg Raises - With your knee straight (if you were given a brace, keep it on), lift the leg to 60 degrees, hold for 3 seconds, and slowly lower the leg.  Perform this exercise against resistance later as your leg gets stronger.  Leg Slides: Lying on your back, slowly slide your foot toward your buttocks, bending your knee up off the floor (only go as far as is comfortable). Then slowly slide your foot back down until your leg is flat on the floor again.  Angel Wings: Lying on your back spread your legs to the side as far apart as you can without causing discomfort.  Hamstring Strength:  Lying on your back, push your heel against the floor with your leg straight by tightening up the muscles of your buttocks.  Repeat, but this time bend your knee to a comfortable angle, and push your heel against the floor.  You may put a pillow under the heel to make it more comfortable if necessary.   A rehabilitation program following joint replacement surgery can speed recovery and prevent re-injury  in the future due to weakened muscles. Contact your doctor or a physical therapist for more information on knee rehabilitation.    CONSTIPATION  Constipation is defined medically as fewer than three stools per week and severe constipation as less than one stool per week.  Even if you have a regular bowel pattern at home, your normal regimen is likely to be disrupted due to multiple reasons following surgery.  Combination of anesthesia, postoperative narcotics, change in appetite and fluid intake all can affect your bowels.   YOU MUST  use at least one of the following options; they are listed in order of increasing strength to get the job done.  They are all available over the counter, and you may need to use some, POSSIBLY even all of these options:    Drink plenty of fluids (prune juice may be helpful) and high fiber foods Colace 100 mg by mouth twice a day  Senokot for constipation as directed and as needed Dulcolax (bisacodyl), take with full glass of water  Miralax (polyethylene glycol) once or twice a day as needed.  If you have tried all these things and are unable to have a bowel movement in the first 3-4 days after surgery call either your surgeon or your primary doctor.    If you experience loose stools or diarrhea, hold the medications until you stool forms back up.  If your symptoms do not get better within 1 week or if they get worse, check with your doctor.  If you experience "the worst abdominal pain ever" or develop nausea or vomiting, please contact the office immediately for further recommendations for treatment.   ITCHING:  If you experience itching with your medications, try taking only a single pain pill, or even half a pain pill at a time.  You can also use Benadryl over the counter for itching or also to help with sleep.   TED HOSE STOCKINGS:  Use stockings on both legs until for at least 2 weeks or as directed by physician office. They may be removed at night for  sleeping.  MEDICATIONS:  See your medication summary on the "After Visit Summary" that nursing will review with you.  You may have some home medications which will be placed on hold until you complete the course of blood thinner medication.  It is important for you to complete the blood thinner medication as prescribed.  PRECAUTIONS:  If you experience chest pain or shortness of breath - call 911 immediately for transfer to the hospital emergency department.   If you develop a fever greater that 101 F, purulent drainage from wound, increased redness or drainage from wound, foul odor from the wound/dressing, or calf pain - CONTACT YOUR SURGEON.                                                   FOLLOW-UP APPOINTMENTS:  If you do not already have a post-op appointment, please call the office for an appointment to be seen by your surgeon.  Guidelines for how soon to be seen are listed in your "After Visit Summary", but are typically between 1-4 weeks after surgery.  OTHER INSTRUCTIONS:   Knee Replacement:  Do not place pillow under knee, focus on keeping the knee straight while resting. CPM instructions: 0-90 degrees, 2 hours in the morning, 2 hours in the afternoon, and 2 hours in the evening. Place foam block, curve side up under heel at all times except when in CPM or when walking.  DO NOT modify, tear, cut, or change the foam block in any way.   MAKE SURE YOU:  Understand these instructions.  Get help right away if you are not doing well or get worse.    Thank you for letting us be a part of your medical care team.  It is a privilege we respect greatly.  We hope these instructions will help  you stay on track for a fast and full recovery!   Diagnostic Studies: DG Chest 2 View  Result Date: 11/08/2020 CLINICAL DATA:  Chronic bronchitis.  Preop for a surgery. EXAM: CHEST - 2 VIEW COMPARISON:  Two-view chest x-ray a 05/12/2018 FINDINGS: Heart size is normal. Changes of COPD noted. Cervical spine  surgery noted. No edema or effusion is present. No focal airspace disease is evident. IMPRESSION: 1. Stable changes of COPD. 2. No acute cardiopulmonary disease. Electronically Signed   By: San Morelle M.D.   On: 11/08/2020 16:30   DG Forearm Right  Result Date: 12/01/2020 CLINICAL DATA:  Right forearm pain after fall. EXAM: RIGHT FOREARM - 2 VIEW COMPARISON:  None. FINDINGS: There is no evidence of fracture or other focal bone lesions. Soft tissues are unremarkable. IMPRESSION: Negative. Electronically Signed   By: Marijo Conception M.D.   On: 12/01/2020 11:39   DG Pelvis Portable  Result Date: 11/30/2020 CLINICAL DATA:  Status post left hip replacement. EXAM: PORTABLE PELVIS 1-2 VIEWS COMPARISON:  Fluoroscopic images of same day. FINDINGS: The left acetabular and femoral components are well situated. Expected postoperative changes are noted in the surrounding soft tissues. IMPRESSION: Status post left total hip arthroplasty. Electronically Signed   By: Marijo Conception M.D.   On: 11/30/2020 11:02   DG Humerus Right  Result Date: 12/01/2020 CLINICAL DATA:  Right arm pain after fall today. EXAM: RIGHT HUMERUS - 2+ VIEW COMPARISON:  None. FINDINGS: There is no evidence of fracture or other focal bone lesions. Soft tissues are unremarkable. IMPRESSION: Negative. Electronically Signed   By: Marijo Conception M.D.   On: 12/01/2020 11:38   DG C-Arm 1-60 Min-No Report  Result Date: 11/30/2020 Fluoroscopy was utilized by the requesting physician.  No radiographic interpretation.   DG HIP OPERATIVE UNILAT W OR W/O PELVIS LEFT  Result Date: 11/30/2020 CLINICAL DATA:  Left hip replacement EXAM: OPERATIVE LEFT HIP (WITH PELVIS IF PERFORMED) 1 VIEW TECHNIQUE: Fluoroscopic spot image(s) were submitted for interpretation post-operatively. COMPARISON:  Pelvis radiograph 12/15/2018 FINDINGS: Four frontal images of the left hip were provided for interpretation. The initial 2 images demonstrate unremarkable  sonographic appearance of the left hip and partially visualized right total hip prosthesis. The final 2 images demonstrate placement of left total hip prosthesis. IMPRESSION: Intraoperative fluoroscopic images of left hip replacement as above. Electronically Signed   By: Miachel Roux M.D.   On: 11/30/2020 12:36    Disposition: Discharge disposition: 01-Home or Self Care       Discharge Instructions     Call MD / Call 911   Complete by: As directed    If you experience chest pain or shortness of breath, CALL 911 and be transported to the hospital emergency room.  If you develope a fever above 101 F, pus (white drainage) or increased drainage or redness at the wound, or calf pain, call your surgeon's office.   Constipation Prevention   Complete by: As directed    Drink plenty of fluids.  Prune juice may be helpful.  You may use a stool softener, such as Colace (over the counter) 100 mg twice a day.  Use MiraLax (over the counter) for constipation as needed.   Diet - low sodium heart healthy   Complete by: As directed    Driving restrictions   Complete by: As directed    No driving for 6 weeks   Follow the hip precautions as taught in Physical Therapy   Complete by:  As directed    Increase activity slowly as tolerated   Complete by: As directed    Lifting restrictions   Complete by: As directed    No lifting for 6 weeks   Post-operative opioid taper instructions:   Complete by: As directed    POST-OPERATIVE OPIOID TAPER INSTRUCTIONS: It is important to wean off of your opioid medication as soon as possible. If you do not need pain medication after your surgery it is ok to stop day one. Opioids include: Codeine, Hydrocodone(Norco, Vicodin), Oxycodone(Percocet, oxycontin) and hydromorphone amongst others.  Long term and even short term use of opiods can cause: Increased pain response Dependence Constipation Depression Respiratory depression And more.  Withdrawal symptoms can  include Flu like symptoms Nausea, vomiting And more Techniques to manage these symptoms Hydrate well Eat regular healthy meals Stay active Use relaxation techniques(deep breathing, meditating, yoga) Do Not substitute Alcohol to help with tapering If you have been on opioids for less than two weeks and do not have pain than it is ok to stop all together.  Plan to wean off of opioids This plan should start within one week post op of your joint replacement. Maintain the same interval or time between taking each dose and first decrease the dose.  Cut the total daily intake of opioids by one tablet each day Next start to increase the time between doses. The last dose that should be eliminated is the evening dose.      TED hose   Complete by: As directed    Use stockings (TED hose) for 2 weeks on both leg(s).  You may remove them at night for sleeping.        Follow-up Information     Rod Can, MD. Go on 12/15/2020.   Specialty: Orthopedic Surgery Why: You are scheduled for first post op appointment on Friday September 9th at 4:00pm. Contact information: [address] [place] [address] [place] [address] [id number]                  Signed: Dorothyann Peng 12/04/2020, 10:24 AM

## 2020-12-15 DIAGNOSIS — Z471 Aftercare following joint replacement surgery: Secondary | ICD-10-CM | POA: Diagnosis not present

## 2020-12-15 DIAGNOSIS — Z96642 Presence of left artificial hip joint: Secondary | ICD-10-CM | POA: Diagnosis not present

## 2021-01-19 DIAGNOSIS — Z96642 Presence of left artificial hip joint: Secondary | ICD-10-CM | POA: Diagnosis not present

## 2021-01-19 DIAGNOSIS — Z471 Aftercare following joint replacement surgery: Secondary | ICD-10-CM | POA: Diagnosis not present

## 2021-02-08 DIAGNOSIS — Z8639 Personal history of other endocrine, nutritional and metabolic disease: Secondary | ICD-10-CM | POA: Diagnosis not present

## 2021-02-08 DIAGNOSIS — F988 Other specified behavioral and emotional disorders with onset usually occurring in childhood and adolescence: Secondary | ICD-10-CM | POA: Diagnosis not present

## 2021-02-08 DIAGNOSIS — G2581 Restless legs syndrome: Secondary | ICD-10-CM | POA: Diagnosis not present

## 2021-02-08 DIAGNOSIS — R7303 Prediabetes: Secondary | ICD-10-CM | POA: Diagnosis not present

## 2021-02-08 DIAGNOSIS — E559 Vitamin D deficiency, unspecified: Secondary | ICD-10-CM | POA: Diagnosis not present

## 2021-02-08 DIAGNOSIS — F321 Major depressive disorder, single episode, moderate: Secondary | ICD-10-CM | POA: Diagnosis not present

## 2021-02-08 DIAGNOSIS — J410 Simple chronic bronchitis: Secondary | ICD-10-CM | POA: Diagnosis not present

## 2021-02-08 DIAGNOSIS — F908 Attention-deficit hyperactivity disorder, other type: Secondary | ICD-10-CM | POA: Diagnosis not present

## 2021-02-08 DIAGNOSIS — E669 Obesity, unspecified: Secondary | ICD-10-CM | POA: Diagnosis not present

## 2021-02-08 DIAGNOSIS — F419 Anxiety disorder, unspecified: Secondary | ICD-10-CM | POA: Diagnosis not present

## 2021-02-08 DIAGNOSIS — M797 Fibromyalgia: Secondary | ICD-10-CM | POA: Diagnosis not present

## 2021-03-19 DIAGNOSIS — U071 COVID-19: Secondary | ICD-10-CM | POA: Diagnosis not present

## 2021-04-10 DIAGNOSIS — L300 Nummular dermatitis: Secondary | ICD-10-CM | POA: Diagnosis not present

## 2021-05-04 DIAGNOSIS — J449 Chronic obstructive pulmonary disease, unspecified: Secondary | ICD-10-CM | POA: Diagnosis not present

## 2021-05-04 DIAGNOSIS — J42 Unspecified chronic bronchitis: Secondary | ICD-10-CM | POA: Diagnosis not present

## 2021-05-04 DIAGNOSIS — J410 Simple chronic bronchitis: Secondary | ICD-10-CM | POA: Diagnosis not present

## 2021-05-04 DIAGNOSIS — F321 Major depressive disorder, single episode, moderate: Secondary | ICD-10-CM | POA: Diagnosis not present

## 2021-05-08 DIAGNOSIS — M7062 Trochanteric bursitis, left hip: Secondary | ICD-10-CM | POA: Diagnosis not present

## 2021-05-08 DIAGNOSIS — S76012D Strain of muscle, fascia and tendon of left hip, subsequent encounter: Secondary | ICD-10-CM | POA: Diagnosis not present

## 2021-05-08 DIAGNOSIS — Z96642 Presence of left artificial hip joint: Secondary | ICD-10-CM | POA: Diagnosis not present

## 2021-05-10 DIAGNOSIS — H9192 Unspecified hearing loss, left ear: Secondary | ICD-10-CM | POA: Diagnosis not present

## 2021-05-10 DIAGNOSIS — H93299 Other abnormal auditory perceptions, unspecified ear: Secondary | ICD-10-CM | POA: Diagnosis not present

## 2021-05-10 DIAGNOSIS — H903 Sensorineural hearing loss, bilateral: Secondary | ICD-10-CM | POA: Diagnosis not present

## 2021-05-10 DIAGNOSIS — H9312 Tinnitus, left ear: Secondary | ICD-10-CM | POA: Diagnosis not present

## 2021-05-14 DIAGNOSIS — M25511 Pain in right shoulder: Secondary | ICD-10-CM | POA: Diagnosis not present

## 2021-05-21 DIAGNOSIS — H9190 Unspecified hearing loss, unspecified ear: Secondary | ICD-10-CM | POA: Diagnosis not present

## 2021-05-21 DIAGNOSIS — H903 Sensorineural hearing loss, bilateral: Secondary | ICD-10-CM | POA: Diagnosis not present

## 2021-06-08 DIAGNOSIS — H93299 Other abnormal auditory perceptions, unspecified ear: Secondary | ICD-10-CM | POA: Diagnosis not present

## 2021-06-08 DIAGNOSIS — H903 Sensorineural hearing loss, bilateral: Secondary | ICD-10-CM | POA: Diagnosis not present

## 2021-06-09 ENCOUNTER — Encounter (HOSPITAL_COMMUNITY): Payer: Self-pay | Admitting: Emergency Medicine

## 2021-06-09 ENCOUNTER — Other Ambulatory Visit: Payer: Self-pay

## 2021-06-09 ENCOUNTER — Emergency Department (HOSPITAL_COMMUNITY)
Admission: EM | Admit: 2021-06-09 | Discharge: 2021-06-09 | Disposition: A | Discharge status: Home / Self Care | Payer: PPO | Attending: Emergency Medicine | Admitting: Emergency Medicine

## 2021-06-09 ENCOUNTER — Emergency Department (HOSPITAL_COMMUNITY): Payer: PPO

## 2021-06-09 DIAGNOSIS — S8012XA Contusion of left lower leg, initial encounter: Secondary | ICD-10-CM | POA: Insufficient documentation

## 2021-06-09 DIAGNOSIS — Y9301 Activity, walking, marching and hiking: Secondary | ICD-10-CM | POA: Insufficient documentation

## 2021-06-09 DIAGNOSIS — W19XXXA Unspecified fall, initial encounter: Secondary | ICD-10-CM

## 2021-06-09 DIAGNOSIS — M25571 Pain in right ankle and joints of right foot: Secondary | ICD-10-CM | POA: Diagnosis not present

## 2021-06-09 DIAGNOSIS — Z23 Encounter for immunization: Secondary | ICD-10-CM | POA: Diagnosis not present

## 2021-06-09 DIAGNOSIS — M25552 Pain in left hip: Secondary | ICD-10-CM | POA: Insufficient documentation

## 2021-06-09 DIAGNOSIS — M79605 Pain in left leg: Secondary | ICD-10-CM | POA: Diagnosis not present

## 2021-06-09 DIAGNOSIS — M7989 Other specified soft tissue disorders: Secondary | ICD-10-CM | POA: Diagnosis not present

## 2021-06-09 DIAGNOSIS — W1789XA Other fall from one level to another, initial encounter: Secondary | ICD-10-CM | POA: Insufficient documentation

## 2021-06-09 DIAGNOSIS — S8992XA Unspecified injury of left lower leg, initial encounter: Secondary | ICD-10-CM | POA: Diagnosis present

## 2021-06-09 DIAGNOSIS — M79662 Pain in left lower leg: Secondary | ICD-10-CM | POA: Diagnosis not present

## 2021-06-09 DIAGNOSIS — M79672 Pain in left foot: Secondary | ICD-10-CM | POA: Diagnosis not present

## 2021-06-09 DIAGNOSIS — M25561 Pain in right knee: Secondary | ICD-10-CM | POA: Insufficient documentation

## 2021-06-09 DIAGNOSIS — R102 Pelvic and perineal pain: Secondary | ICD-10-CM | POA: Diagnosis not present

## 2021-06-09 DIAGNOSIS — T148XXA Other injury of unspecified body region, initial encounter: Secondary | ICD-10-CM

## 2021-06-09 DIAGNOSIS — M545 Low back pain, unspecified: Secondary | ICD-10-CM | POA: Diagnosis not present

## 2021-06-09 MED ORDER — TETANUS-DIPHTH-ACELL PERTUSSIS 5-2.5-18.5 LF-MCG/0.5 IM SUSY
0.5000 mL | PREFILLED_SYRINGE | Freq: Once | INTRAMUSCULAR | Status: AC
  Administered 2021-06-09: 0.5 mL via INTRAMUSCULAR
  Filled 2021-06-09 (×2): qty 0.5

## 2021-06-09 MED ORDER — LIDOCAINE 5 % EX PTCH: 1.0000 | MEDICATED_PATCH | CUTANEOUS | 0 refills | Status: AC

## 2021-06-09 MED ORDER — METHOCARBAMOL 500 MG PO TABS: 500.0000 mg | ORAL_TABLET | Freq: Two times a day (BID) | ORAL | 0 refills | Status: AC

## 2021-06-09 MED ORDER — MORPHINE SULFATE (PF) 4 MG/ML IV SOLN
4.0000 mg | Freq: Once | INTRAVENOUS | Status: AC
  Administered 2021-06-09: 4 mg via INTRAMUSCULAR
  Filled 2021-06-09: qty 1

## 2021-06-09 MED ORDER — OXYCODONE HCL 5 MG PO TABS: 5.0000 mg | ORAL_TABLET | ORAL | 0 refills | Status: DC | PRN

## 2021-06-09 MED ORDER — OXYCODONE-ACETAMINOPHEN 5-325 MG PO TABS
1.0000 | ORAL_TABLET | Freq: Once | ORAL | Status: AC
  Administered 2021-06-09: 1 via ORAL
  Filled 2021-06-09: qty 1

## 2021-06-09 MED ORDER — NAPROXEN 500 MG PO TABS: 500.0000 mg | ORAL_TABLET | Freq: Two times a day (BID) | ORAL | 0 refills | Status: DC

## 2021-06-09 NOTE — ED Notes (Signed)
Pt ambulated in room, although complained of leg feeling sore she was able to bear weight on leg and able to get dressed. ?

## 2021-06-09 NOTE — ED Notes (Signed)
Pt returned from xray

## 2021-06-09 NOTE — Discharge Instructions (Signed)
It was a pleasure taking care of you here in the emergency department today ? ?Your imaging did not show any evidence of broken bones or dislocations. ? ?You do have a developing hematoma which is soft tissue swelling and a collection of blood to your left front of your leg by your shin.  Make sure to keep a compression wrap on this area to limit the swelling.  Would also recommend ibuprofen, ice, elevate the leg. ? ?I have written you for a short course of pain medicine.  Please take as prescribed.  Please use caution as this is an opiate medication and does have the addictive potential.  Do not drive, operate heavy machinery or make life or death decisions while taking this medication. ? ?Follow-up with your primary care provider return to emergency department if you have any new or worsening symptoms ?

## 2021-06-09 NOTE — ED Notes (Signed)
Pt still down in x-ray. ?

## 2021-06-09 NOTE — ED Triage Notes (Signed)
Pt states she was walking on a deck her husband was replacing and walked on rotten wood and fell just PTA.  C/o pain to L hip, L groin, and L lower leg with abrasions to L lower leg.  Denies LOC.  History of L hip replacement.  Pt crying on arrival. ?

## 2021-06-09 NOTE — ED Provider Notes (Signed)
[facility] EMERGENCY DEPARTMENT Provider Note   CSN: [id number] Arrival date & time: 06/09/21  1844     History  Chief Complaint  Patient presents with   Lytle Michaels    [name] is a 61 y.o. female here for evaluation of fall.  She was walking on a deck that her husband was replacing subsequently falling through a rotten wood board.  This occurred just PTA.  She suffered abrasions to her left lower extremity.  She has pain to her left foot, tib-fib, femur, left hip as well as right knee and ankle.  She has had multiple surgical procedures with Dr. Lyla Glassing with [facility].  Unsure last tetanus.  She denies hitting her head, LOC or anticoagulation.  She has some mild lower aching back pain.  Denies bowel or bladder incontinence, saddle paresthesia.  No pain to chest, abdomen, numbness or weakness. Ambulatory however with pain.  HPI     Home Medications Prior to Admission medications   Medication Sig Start Date End Date Taking? Authorizing Provider  lidocaine (LIDODERM) 5 % Place 1 patch onto the skin daily. Remove & Discard patch within 12 hours or as directed by MD 06/09/21  Yes Shirleyann Montero A, PA-C  methocarbamol (ROBAXIN) 500 MG tablet Take 1 tablet (500 mg total) by mouth 2 (two) times daily. 06/09/21  Yes Titania Gault A, PA-C  naproxen (NAPROSYN) 500 MG tablet Take 1 tablet (500 mg total) by mouth 2 (two) times daily. 06/09/21  Yes Drea Jurewicz A, PA-C  oxyCODONE (ROXICODONE) 5 MG immediate release tablet Take 1 tablet (5 mg total) by mouth every 4 (four) hours as needed for severe pain. 06/09/21  Yes Jaycen Vercher A, PA-C  acetaminophen (TYLENOL) 325 MG tablet Take 1-2 tablets (325-650 mg total) by mouth every 6 (six) hours as needed for mild pain (pain score 1-3 or temp > 100.5). 12/01/20   Dorothyann Peng, PA-C  albuterol (VENTOLIN HFA) 108 (90 Base) MCG/ACT inhaler Inhale 2 puffs into the lungs See admin instructions. Inhale 2 puffs (scheduled) in the  morning & every 6 hours as needed for wheezing/shortness of breath. 01/13/17   [provider]  amphetamine-dextroamphetamine (ADDERALL) 20 MG tablet Take 1 tablet (20 mg total) daily by mouth. 02/14/17   Vicie Mutters R, PA-C  BREO ELLIPTA 100-25 MCG/INH AEPB Inhale 1 puff into the lungs daily. 07/26/20   [provider]  calcium carbonate (OSCAL) 1500 (600 Ca) MG TABS tablet Take 600 mg of elemental calcium by mouth every evening.    [provider]  Cholecalciferol (VITAMIN D-3) 125 MCG (5000 UT) TABS Take 5,000 Units by mouth in the morning.    [provider]  clonazePAM (KLONOPIN) 2 MG tablet Take 1 tablet (2 mg total) by mouth at bedtime. for anxiety 01/13/17   Liane Comber, NP  docusate sodium (COLACE) 100 MG capsule Take 1 capsule (100 mg total) by mouth 2 (two) times daily. 12/01/20   Dorothyann Peng, PA-C  escitalopram (LEXAPRO) 20 MG tablet Take 20 mg by mouth every evening. 08/29/20   [provider]  gabapentin (NEURONTIN) 100 MG capsule Take 1 capsule (100 mg total) by mouth 3 (three) times daily as needed. Fibromyalgia pain Patient taking differently: Take 100 mg by mouth 2 (two) times daily. Fibromyalgia pain 01/13/17   Unk Pinto, MD  magnesium oxide (MAG-OX) 400 MG tablet Take 400 mg by mouth daily as needed (regularity/constipation.).    [provider]  ondansetron (ZOFRAN) 4 MG tablet  Take 1 tablet (4 mg total) by mouth every 6 (six) hours as needed for nausea. 12/01/20   Dorothyann Peng, PA-C  Polyethyl Glycol-Propyl Glycol (LUBRICANT EYE DROPS) 0.4-0.3 % SOLN Place 1-2 drops into both eyes 3 (three) times daily as needed (dry/irritated eyes).    [provider]  senna (SENOKOT) 8.6 MG TABS tablet Take 1 tablet (8.6 mg total) by mouth 2 (two) times daily. 12/01/20   Dorothyann Peng, PA-C  tiotropium (SPIRIVA) 18 MCG inhalation capsule Place 18 mcg into inhaler and inhale daily.    [provider]       Allergies    Sulfa antibiotics    Review of Systems   Review of Systems  Constitutional: Negative.   HENT: Negative.    Respiratory: Negative.    Cardiovascular: Negative.   Gastrointestinal: Negative.   Genitourinary: Negative.   Musculoskeletal:  Positive for back pain.       Left hip, femur, tib-fib, foot, right knee, ankle  Skin:  Positive for wound.  Neurological: Negative.   All other systems reviewed and are negative.  Physical Exam Updated Vital Signs BP (!) 153/70    Pulse (!) 56    Temp 97.6 F (36.4 C) (Oral)    Resp 20    SpO2 98%  Physical Exam Vitals and nursing note reviewed.  Constitutional:      General: She is not in acute distress.    Appearance: She is well-developed. She is not ill-appearing, toxic-appearing or diaphoretic.  HENT:     Head: Normocephalic and atraumatic.     Jaw: There is normal jaw occlusion.     Comments: No drooling, dysphagia or trismus.  Nontender facial bones, no battle sign, raccoon eye    Nose: Nose normal.     Mouth/Throat:     Mouth: Mucous membranes are moist.  Eyes:     Pupils: Pupils are equal, round, and reactive to light.  Neck:     Trachea: Trachea and phonation normal.     Comments: Full range of motion, no midline cervical tenderness or step-off Cardiovascular:     Rate and Rhythm: Normal rate.     Pulses: Normal pulses.          Radial pulses are 2+ on the right side and 2+ on the left side.       Femoral pulses are 2+ on the right side and 2+ on the left side.      Dorsalis pedis pulses are 2+ on the right side and 2+ on the left side.     Heart sounds: Normal heart sounds.  Pulmonary:     Effort: Pulmonary effort is normal. No respiratory distress.     Breath sounds: Normal breath sounds and air entry.     Comments: Clear bilaterally, speaks in full sentences without difficulty Chest:     Comments: Nontender anterior posterior chest wall Abdominal:     General: Bowel sounds are normal. There is no  distension.     Palpations: Abdomen is soft.     Tenderness: There is no abdominal tenderness. There is no guarding or rebound.     Comments: Abdomen soft, nontender  Musculoskeletal:        General: Normal range of motion.     Cervical back: Full passive range of motion without pain and normal range of motion.     Comments: No midline C/T tenderness.  Mild tenderness to midline lumbar region, left hip, femur, tib-fib and foot.  No  shortening or rotation of legs.  She is able to flex and extend at bilateral knees however some aching pain.  She has a mild tenderness to her right knee and ankle, wiggles toes without difficulty.  Nontender bilateral upper extremities  Skin:    General: Skin is warm and dry.     Capillary Refill: Capillary refill takes less than 2 seconds.     Findings: Abrasion, bruising, signs of injury and laceration present.     Comments: Various abrasions, contusions, skin tears. No lacerations to suture  Neurological:     General: No focal deficit present.     Mental Status: She is alert.     Cranial Nerves: Cranial nerves 2-12 are intact.     Sensory: Sensation is intact.     Motor: Motor function is intact.     Comments: Cn 2-12 grossly intact Equal strength Intact sensation  Psychiatric:        Mood and Affect: Mood normal.    ED Results / Procedures / Treatments   Labs (all labs ordered are listed, but only abnormal results are displayed) Labs Reviewed - No data to display  EKG None  Radiology DG Lumbar Spine Complete  Result Date: 06/09/2021 CLINICAL DATA:  Fall through deck with low back pain, initial encounter EXAM: LUMBAR SPINE - COMPLETE 4+ VIEW COMPARISON:  None. FINDINGS: Five lumbar type vertebral bodies are well visualized. Vertebral body height is well maintained. Mild osteophytic changes are noted. Changes of prior right sacroiliac joint fusion and bilateral hip replacement are noted. No pars defects are seen. No anterolisthesis is noted. No soft  tissue abnormality is noted. IMPRESSION: Degenerative change and postoperative change without acute abnormality. Electronically Signed   By: Inez Catalina M.D.   On: 06/09/2021 20:59   DG Pelvis 1-2 Views  Result Date: 06/09/2021 CLINICAL DATA:  Recent fall through deck with pelvic pain, initial encounter EXAM: PELVIS - 1 VIEW COMPARISON:  11/30/2020 FINDINGS: Bilateral hip replacements are again identified as are changes of prior right sacroiliac joint fusion. Pelvic ring is intact. No soft tissue abnormality is noted. IMPRESSION: No acute abnormality seen. Electronically Signed   By: Inez Catalina M.D.   On: 06/09/2021 21:00   DG Tibia/Fibula Left  Result Date: 06/09/2021 CLINICAL DATA:  Fall through deck with left lower leg pain, initial encounter EXAM: LEFT TIBIA AND FIBULA - 2 VIEW COMPARISON:  None. FINDINGS: No acute fracture or dislocation is noted. Mild soft tissue swelling is noted about the proximal tibia. IMPRESSION: No acute bony abnormality is noted. Mild soft tissue injury is noted. Electronically Signed   By: Inez Catalina M.D.   On: 06/09/2021 21:01   DG Ankle Complete Right  Result Date: 06/09/2021 CLINICAL DATA:  Fall through deck with right ankle pain, initial encounter EXAM: RIGHT ANKLE - COMPLETE 3+ VIEW COMPARISON:  02/15/2017 FINDINGS: There are changes consistent with prior ankle fusion involving the tibiotalar joint and talonavicular joint. Fixation hardware is stable. Some dystrophic calcification is again seen about the fusion site. No acute fracture or dislocation is noted. No soft tissue changes are seen. IMPRESSION: Postsurgical changes stable in appearance from the prior exam. No acute abnormality noted. Electronically Signed   By: Inez Catalina M.D.   On: 06/09/2021 21:04   DG Knee Complete 4 Views Right  Result Date: 06/09/2021 CLINICAL DATA:  Fall through deck with right knee pain, initial encounter EXAM: RIGHT KNEE - COMPLETE 4+ VIEW COMPARISON:  None. FINDINGS: No  evidence of fracture,  dislocation, or joint effusion. No evidence of arthropathy or other focal bone abnormality. Soft tissues are unremarkable. IMPRESSION: No acute abnormality noted. Electronically Signed   By: Inez Catalina M.D.   On: 06/09/2021 21:02   DG Foot Complete Left  Result Date: 06/09/2021 CLINICAL DATA:  Fall through deck with left foot pain, initial encounter EXAM: LEFT FOOT - COMPLETE 3+ VIEW COMPARISON:  None. FINDINGS: No acute fracture or dislocation is noted. Small calcaneal spurs are seen. No soft tissue abnormality is noted. IMPRESSION: Mild degenerative change without acute abnormality. Electronically Signed   By: Inez Catalina M.D.   On: 06/09/2021 21:02   DG Femur Min 2 Views Left  Result Date: 06/09/2021 CLINICAL DATA:  Fall through deck with left leg pain, initial encounter EXAM: LEFT FEMUR 2 VIEWS COMPARISON:  None. FINDINGS: Left hip prosthesis is seen. No dislocation is noted. No acute fracture is seen. No soft tissue abnormality is noted. IMPRESSION: No acute abnormality seen. Electronically Signed   By: Inez Catalina M.D.   On: 06/09/2021 21:00    Procedures .Ortho Injury Treatment  Date/Time: 06/09/2021 9:27 PM Performed by: Nettie Elm, PA-C Authorized by: Nettie Elm, PA-C   Consent:    Consent obtained:  Verbal   Consent given by:  Patient   Risks discussed:  Nerve damage, restricted joint movement, vascular damage, stiffness, recurrent dislocation, irreducible dislocation and fracture   Alternatives discussed:  No treatment, alternative treatment, immobilization, referral and delayed treatmentInjury location: lower leg Location details: left lower leg Injury type: soft tissue Pre-procedure neurovascular assessment: neurovascularly intact Pre-procedure distal perfusion: normal Pre-procedure neurological function: normal Pre-procedure range of motion: normal  Anesthesia: Local anesthesia used: no  Patient sedated: No     Medications  Ordered in ED Medications  Tdap (BOOSTRIX) injection 0.5 mL (0.5 mLs Intramuscular Given 06/09/21 2128)  morphine (PF) 4 MG/ML injection 4 mg (4 mg Intramuscular Given 06/09/21 1929)  oxyCODONE-acetaminophen (PERCOCET/ROXICET) 5-325 MG per tablet 1 tablet (1 tablet Oral Given 06/09/21 2125)    ED Course/ Medical Decision Making/ A&P    61 year old here for evaluation after mechanical fall through wood deck just PTA.  Denies hitting head, LOC or anticoagulation.  She has some various contusions and abrasions.  Superficial laceration/abrasion to distal tib-fib with overlying small hematoma.  She is neurovascularly intact.  Mild tenderness to midline lumbar region without bowel or bladder incontinence, saddle paresthesia.  She is neurovascularly intact.  Abdomen soft, nontender evidence of acute intrathoracic, intra-abdominal traumatic injury. Has had multiple orthopedic surgical procedures with Dr. Lyla Glassing with [facility].  We will plan on imaging and reassess as well as treating pain  Imaging personally viewed and interpreted:  No significant abnormality  Patient reassessed, pain improved however still having some mild pain.  She remains neurovascularly intact.  I was able to clean her wounds to her left lower extremity.  No lacerations to suture.  Did place some bacitracin to this area.  She does have a small hematoma/soft tissue swelling to her left midshaft/proximal tib-fib.  I discussed ice, elevation, Ace compression wrap which she have applied here in the emergency department.  Patient able to ambulate without difficulty.  We will have her follow-up outpatient with PCP, orthopedics.  Discussed RICE for symptomatic management, return for new or worsening symptoms.  Patient agreeable  The patient has been appropriately medically screened and/or stabilized in the ED. I have low suspicion for any other emergent medical condition which would require further screening, evaluation or treatment in  the  ED or require inpatient management.  Patient is hemodynamically stable and in no acute distress.  Patient able to ambulate in department prior to ED.  Evaluation does not show acute pathology that would require ongoing or additional emergent interventions while in the emergency department or further inpatient treatment.  I have discussed the diagnosis with the patient and answered all questions.  Pain is been managed while in the emergency department and patient has no further complaints prior to discharge.  Patient is comfortable with plan discussed in room and is stable for discharge at this time.  I have discussed strict return precautions for returning to the emergency department.  Patient was encouraged to follow-up with PCP/specialist refer to at discharge.                            Medical Decision Making Amount and/or Complexity of Data Reviewed Independent Historian: spouse Radiology: ordered and independent interpretation performed. Decision-making details documented in ED Course.  Risk OTC drugs. Prescription drug management. Parenteral controlled substances.         Final Clinical Impression(s) / ED Diagnoses Final diagnoses:  Fall, initial encounter  Abrasion  Hematoma of left lower extremity, initial encounter    Rx / DC Orders ED Discharge Orders          Ordered    oxyCODONE (ROXICODONE) 5 MG immediate release tablet  Every 4 hours PRN        06/09/21 2159    methocarbamol (ROBAXIN) 500 MG tablet  2 times daily        06/09/21 2159    naproxen (NAPROSYN) 500 MG tablet  2 times daily        06/09/21 2159    lidocaine (LIDODERM) 5 %  Every 24 hours        06/09/21 2159              Reese Stockman A, PA-C 06/09/21 2207    Wyvonnia Dusky, MD 06/10/21 1021

## 2021-06-09 NOTE — ED Notes (Signed)
Discharge instructions and prescriptions reviewed and explained, pt verbalized understanding. ?

## 2021-07-10 DIAGNOSIS — M7062 Trochanteric bursitis, left hip: Secondary | ICD-10-CM | POA: Diagnosis not present

## 2021-07-10 DIAGNOSIS — M5459 Other low back pain: Secondary | ICD-10-CM | POA: Diagnosis not present

## 2021-07-10 DIAGNOSIS — S76012D Strain of muscle, fascia and tendon of left hip, subsequent encounter: Secondary | ICD-10-CM | POA: Diagnosis not present

## 2021-07-10 DIAGNOSIS — Z96642 Presence of left artificial hip joint: Secondary | ICD-10-CM | POA: Diagnosis not present

## 2021-07-12 ENCOUNTER — Other Ambulatory Visit: Payer: Self-pay | Admitting: Orthopedic Surgery

## 2021-07-12 DIAGNOSIS — M5459 Other low back pain: Secondary | ICD-10-CM

## 2021-07-15 ENCOUNTER — Ambulatory Visit
Admission: RE | Admit: 2021-07-15 | Discharge: 2021-07-15 | Disposition: A | Discharge status: Home / Self Care | Payer: PPO | Source: Ambulatory Visit | Attending: Orthopedic Surgery | Admitting: Orthopedic Surgery

## 2021-07-15 DIAGNOSIS — M545 Low back pain, unspecified: Secondary | ICD-10-CM | POA: Diagnosis not present

## 2021-07-15 DIAGNOSIS — M2578 Osteophyte, vertebrae: Secondary | ICD-10-CM | POA: Diagnosis not present

## 2021-07-15 DIAGNOSIS — M5459 Other low back pain: Secondary | ICD-10-CM

## 2021-07-15 DIAGNOSIS — R200 Anesthesia of skin: Secondary | ICD-10-CM | POA: Diagnosis not present

## 2021-09-07 DIAGNOSIS — E669 Obesity, unspecified: Secondary | ICD-10-CM | POA: Diagnosis not present

## 2021-09-07 DIAGNOSIS — Z8639 Personal history of other endocrine, nutritional and metabolic disease: Secondary | ICD-10-CM | POA: Diagnosis not present

## 2021-09-07 DIAGNOSIS — G2581 Restless legs syndrome: Secondary | ICD-10-CM | POA: Diagnosis not present

## 2021-09-07 DIAGNOSIS — F411 Generalized anxiety disorder: Secondary | ICD-10-CM | POA: Diagnosis not present

## 2021-09-07 DIAGNOSIS — R7303 Prediabetes: Secondary | ICD-10-CM | POA: Diagnosis not present

## 2021-09-07 DIAGNOSIS — E7800 Pure hypercholesterolemia, unspecified: Secondary | ICD-10-CM | POA: Diagnosis not present

## 2021-09-07 DIAGNOSIS — F988 Other specified behavioral and emotional disorders with onset usually occurring in childhood and adolescence: Secondary | ICD-10-CM | POA: Diagnosis not present

## 2021-09-07 DIAGNOSIS — M797 Fibromyalgia: Secondary | ICD-10-CM | POA: Diagnosis not present

## 2021-09-07 DIAGNOSIS — J410 Simple chronic bronchitis: Secondary | ICD-10-CM | POA: Diagnosis not present

## 2021-09-07 DIAGNOSIS — E559 Vitamin D deficiency, unspecified: Secondary | ICD-10-CM | POA: Diagnosis not present

## 2021-09-07 DIAGNOSIS — F321 Major depressive disorder, single episode, moderate: Secondary | ICD-10-CM | POA: Diagnosis not present

## 2021-09-08 DIAGNOSIS — Z1231 Encounter for screening mammogram for malignant neoplasm of breast: Secondary | ICD-10-CM | POA: Diagnosis not present

## 2021-09-17 DIAGNOSIS — H903 Sensorineural hearing loss, bilateral: Secondary | ICD-10-CM | POA: Diagnosis not present

## 2021-09-17 DIAGNOSIS — H9122 Sudden idiopathic hearing loss, left ear: Secondary | ICD-10-CM | POA: Diagnosis not present

## 2021-09-18 DIAGNOSIS — Z96642 Presence of left artificial hip joint: Secondary | ICD-10-CM | POA: Diagnosis not present

## 2021-10-03 DIAGNOSIS — F419 Anxiety disorder, unspecified: Secondary | ICD-10-CM | POA: Diagnosis not present

## 2021-10-03 DIAGNOSIS — J449 Chronic obstructive pulmonary disease, unspecified: Secondary | ICD-10-CM | POA: Diagnosis not present

## 2021-10-05 DIAGNOSIS — M5451 Vertebrogenic low back pain: Secondary | ICD-10-CM | POA: Diagnosis not present

## 2021-10-11 ENCOUNTER — Other Ambulatory Visit: Payer: Self-pay | Admitting: Orthopedic Surgery

## 2021-10-11 DIAGNOSIS — M25552 Pain in left hip: Secondary | ICD-10-CM | POA: Diagnosis not present

## 2021-10-11 DIAGNOSIS — Z96642 Presence of left artificial hip joint: Secondary | ICD-10-CM | POA: Diagnosis not present

## 2021-10-11 DIAGNOSIS — M545 Low back pain, unspecified: Secondary | ICD-10-CM

## 2021-10-11 DIAGNOSIS — M79652 Pain in left thigh: Secondary | ICD-10-CM | POA: Diagnosis not present

## 2021-10-16 ENCOUNTER — Other Ambulatory Visit (HOSPITAL_COMMUNITY): Payer: Self-pay | Admitting: Orthopedic Surgery

## 2021-10-16 ENCOUNTER — Other Ambulatory Visit: Payer: Self-pay | Admitting: Orthopedic Surgery

## 2021-10-16 DIAGNOSIS — M25552 Pain in left hip: Secondary | ICD-10-CM

## 2021-10-23 ENCOUNTER — Encounter (HOSPITAL_COMMUNITY)
Admission: RE | Admit: 2021-10-23 | Discharge: 2021-10-23 | Disposition: A | Discharge status: Home / Self Care | Payer: PPO | Source: Ambulatory Visit | Attending: Orthopedic Surgery | Admitting: Orthopedic Surgery

## 2021-10-23 DIAGNOSIS — Z471 Aftercare following joint replacement surgery: Secondary | ICD-10-CM | POA: Diagnosis not present

## 2021-10-23 DIAGNOSIS — Z96642 Presence of left artificial hip joint: Secondary | ICD-10-CM | POA: Diagnosis not present

## 2021-10-23 DIAGNOSIS — M25552 Pain in left hip: Secondary | ICD-10-CM | POA: Diagnosis not present

## 2021-10-23 MED ORDER — TECHNETIUM TC 99M MEDRONATE IV KIT
20.0000 | PACK | Freq: Once | INTRAVENOUS | Status: AC | PRN
  Administered 2021-10-23: 20 via INTRAVENOUS

## 2021-10-25 DIAGNOSIS — M79652 Pain in left thigh: Secondary | ICD-10-CM | POA: Diagnosis not present

## 2021-10-25 DIAGNOSIS — M25552 Pain in left hip: Secondary | ICD-10-CM | POA: Diagnosis not present

## 2021-11-07 DIAGNOSIS — L578 Other skin changes due to chronic exposure to nonionizing radiation: Secondary | ICD-10-CM | POA: Diagnosis not present

## 2021-11-07 DIAGNOSIS — L814 Other melanin hyperpigmentation: Secondary | ICD-10-CM | POA: Diagnosis not present

## 2021-11-07 DIAGNOSIS — L820 Inflamed seborrheic keratosis: Secondary | ICD-10-CM | POA: Diagnosis not present

## 2021-11-07 DIAGNOSIS — D485 Neoplasm of uncertain behavior of skin: Secondary | ICD-10-CM | POA: Diagnosis not present

## 2021-11-19 ENCOUNTER — Ambulatory Visit
Admission: RE | Admit: 2021-11-19 | Discharge: 2021-11-19 | Disposition: A | Discharge status: Home / Self Care | Payer: PPO | Source: Ambulatory Visit | Attending: Orthopedic Surgery | Admitting: Orthopedic Surgery

## 2021-11-19 DIAGNOSIS — M545 Low back pain, unspecified: Secondary | ICD-10-CM

## 2021-11-19 DIAGNOSIS — M25552 Pain in left hip: Secondary | ICD-10-CM | POA: Diagnosis not present

## 2021-11-27 DIAGNOSIS — M533 Sacrococcygeal disorders, not elsewhere classified: Secondary | ICD-10-CM | POA: Diagnosis not present

## 2021-12-04 ENCOUNTER — Other Ambulatory Visit: Payer: Self-pay | Admitting: Orthopedic Surgery

## 2021-12-13 NOTE — Pre-Procedure Instructions (Signed)
Surgical Instructions    Your procedure is scheduled on December 20, 2021.  Report to [facility] Main Entrance "A" at 8:30 A.M., then check in with the Admitting office.  Call this number if you have problems the morning of surgery:  [phone]   If you have any questions prior to your surgery date call [phone]: Open Monday-Friday 8am-4pm    Remember:  Do not eat after midnight the night before your surgery  You may drink clear liquids until 7:30 AM the morning of your surgery.   Clear liquids allowed are: Water, Non-Citrus Juices (without pulp), Carbonated Beverages, Clear Tea, Black Coffee Only (NO MILK, CREAM OR POWDERED CREAMER of any kind), and Gatorade.  Patient Instructions  The night before surgery:  No food after midnight. ONLY clear liquids after midnight  The day of surgery (if you do NOT have diabetes):  Drink ONE (1) Pre-Surgery Clear Ensure by 7:30 AM the morning of surgery. Drink in one sitting. Do not sip.  This drink was given to you during your hospital  pre-op appointment visit.  Nothing else to drink after completing the  Pre-Surgery Clear Ensure.         If you have questions, please contact your surgeon's office.     Take these medicines the morning of surgery with A SIP OF WATER:  gabapentin (NEURONTIN)  tiotropium (SPIRIVA)   traMADol (ULTRAM)   Take these medicines the morning of surgery AS NEEDED:  albuterol (VENTOLIN HFA)   Polyethyl Glycol-Propyl Glycol (LUBRICANT EYE DROPS)    Follow your surgeon's instructions on when to stop Aspirin.  If no instructions were given by your surgeon then you will need to call the office to get those instructions.     As of today, STOP taking any Aleve, Naproxen, Ibuprofen, Motrin, Advil, Goody's, BC's, all herbal medications, fish oil, and all vitamins.                     Do NOT Smoke (Tobacco/Vaping) for 24 hours prior to your procedure.  If you use a CPAP at night, you may bring your  mask/headgear for your overnight stay.   Contacts, glasses, piercing's, hearing aid's, dentures or partials may not be worn into surgery, please bring cases for these belongings.    For patients admitted to the hospital, discharge time will be determined by your treatment team.   Patients discharged the day of surgery will not be allowed to drive home, and someone needs to stay with them for 24 hours.  SURGICAL WAITING ROOM VISITATION Patients having surgery or a procedure may have no more than 2 support people in the waiting area - these visitors may rotate.   Children under the age of 76 must have an adult with them who is not the patient. If the patient needs to stay at the hospital during part of their recovery, the visitor guidelines for inpatient rooms apply. Pre-op nurse will coordinate an appropriate time for 1 support person to accompany patient in pre-op.  This support person may not rotate.   Please refer to the [facility] website for the visitor guidelines for Inpatients (after your surgery is over and you are in a regular room).    Special instructions:   [place]- Preparing For Surgery  Before surgery, you can play an important role. Because skin is not sterile, your skin needs to be as free of germs as possible. You can reduce the number of germs on your skin by washing  with CHG (chlorahexidine gluconate) Soap before surgery.  CHG is an antiseptic cleaner which kills germs and bonds with the skin to continue killing germs even after washing.    Oral Hygiene is also important to reduce your risk of infection.  Remember - BRUSH YOUR TEETH THE MORNING OF SURGERY WITH YOUR REGULAR TOOTHPASTE  Please do not use if you have an allergy to CHG or antibacterial soaps. If your skin becomes reddened/irritated stop using the CHG.  Do not shave (including legs and underarms) for at least 48 hours prior to first CHG shower. It is OK to shave your face.  Please follow these instructions  carefully.   Shower the NIGHT BEFORE SURGERY and the MORNING OF SURGERY  If you chose to wash your hair, wash your hair first as usual with your normal shampoo.  After you shampoo, rinse your hair and body thoroughly to remove the shampoo.  Use CHG Soap as you would any other liquid soap. You can apply CHG directly to the skin and wash gently with a scrungie or a clean washcloth.   Apply the CHG Soap to your body ONLY FROM THE NECK DOWN.  Do not use on open wounds or open sores. Avoid contact with your eyes, ears, mouth and genitals (private parts). Wash Face and genitals (private parts)  with your normal soap.   Wash thoroughly, paying special attention to the area where your surgery will be performed.  Thoroughly rinse your body with warm water from the neck down.  DO NOT shower/wash with your normal soap after using and rinsing off the CHG Soap.  Pat yourself dry with a CLEAN TOWEL.  Wear CLEAN PAJAMAS to bed the night before surgery  Place CLEAN SHEETS on your bed the night before your surgery  DO NOT SLEEP WITH PETS.   Day of Surgery: Take a shower with CHG soap. Do not wear jewelry or makeup Do not wear lotions, powders, perfumes, or deodorant. Do not shave 48 hours prior to surgery.   Do not bring valuables to the hospital.  [facility] is not responsible for any belongings or valuables. Do not wear nail polish, gel polish, artificial nails, or any other type of covering on natural nails (fingers and toes) If you have artificial nails or gel coating that need to be removed by a nail salon, please have this removed prior to surgery. Artificial nails or gel coating may interfere with anesthesia's ability to adequately monitor your vital signs.  Wear Clean/Comfortable clothing the morning of surgery Remember to brush your teeth WITH YOUR REGULAR TOOTHPASTE.   Please read over the following fact sheets that you were given.    If you received a COVID test during your  pre-op visit  it is requested that you wear a mask when out in public, stay away from anyone that may not be feeling well and notify your surgeon if you develop symptoms. If you have been in contact with anyone that has tested positive in the last 10 days please notify you surgeon.

## 2021-12-14 ENCOUNTER — Other Ambulatory Visit: Payer: Self-pay

## 2021-12-14 ENCOUNTER — Encounter (HOSPITAL_COMMUNITY): Payer: Self-pay

## 2021-12-14 ENCOUNTER — Encounter (HOSPITAL_COMMUNITY)
Admission: RE | Admit: 2021-12-14 | Discharge: 2021-12-14 | Disposition: A | Discharge status: Home / Self Care | Payer: PPO | Source: Ambulatory Visit | Attending: Orthopedic Surgery | Admitting: Orthopedic Surgery

## 2021-12-14 VITALS — BP 146/68 | HR 63 | Temp 98.1°F | Resp 18 | Ht 63.0 in | Wt 223.1 lb

## 2021-12-14 DIAGNOSIS — Z01818 Encounter for other preprocedural examination: Secondary | ICD-10-CM

## 2021-12-14 DIAGNOSIS — I2510 Atherosclerotic heart disease of native coronary artery without angina pectoris: Secondary | ICD-10-CM | POA: Diagnosis not present

## 2021-12-14 LAB — BASIC METABOLIC PANEL
Anion gap: 7 (ref 5–15)
BUN: 19 mg/dL (ref 8–23)
CO2: 27 mmol/L (ref 22–32)
Calcium: 9.2 mg/dL (ref 8.9–10.3)
Chloride: 104 mmol/L (ref 98–111)
Creatinine, Ser: 0.72 mg/dL (ref 0.44–1.00)
GFR, Estimated: >60 mL/min (ref >60)
Glucose, Bld: 95 mg/dL (ref 70–99)
Potassium: 4.3 mmol/L (ref 3.5–5.1)
Sodium: 138 mmol/L (ref 135–145)

## 2021-12-14 LAB — TYPE AND SCREEN
ABO/RH(D): A POS
Antibody Screen: NEGATIVE

## 2021-12-14 LAB — CBC
HCT: 43.2 % (ref 36.0–46.0)
Hemoglobin: 14.3 g/dL (ref 12.0–15.0)
MCH: 29.8 pg (ref 26.0–34.0)
MCHC: 33.1 g/dL (ref 30.0–36.0)
MCV: 90 fL (ref 80.0–100.0)
Platelets: 309 10*3/uL (ref 150–400)
RBC: 4.8 MIL/uL (ref 3.87–5.11)
RDW: 12.9 % (ref 11.5–15.5)
WBC: 9.7 10*3/uL (ref 4.0–10.5)
nRBC: 0 % (ref 0.0–0.2)

## 2021-12-14 LAB — SURGICAL PCR SCREEN
MRSA, PCR: NEGATIVE
Staphylococcus aureus: NEGATIVE

## 2021-12-14 NOTE — Progress Notes (Signed)
PCP - Marilynne Drivers, PA Cardiologist - Denies  PPM/ICD - Denies Device Orders - n/a Rep Notified - n/a  Chest x-ray - 11/08/2020 EKG - 12/14/2021 Stress Test - Denies ECHO - Denies Cardiac Cath - Denies   Sleep Study - Denies CPAP - n/a  No DM  Blood Thinner Instructions: n/a Aspirin Instructions:Pt has already stopped her ASA per surgeons instructions.  ERAS Protcol - Yes. Clear liquids until 0430 morning of surgery PRE-SURGERY Ensure or G2- Ensure given to patient at PAT visit  COVID TEST- n/a   Anesthesia review: Yes. Abnormal EKG.  Patient denies shortness of breath, fever, cough and chest pain at PAT appointment   All instructions explained to the patient, with a verbal understanding of the material. Patient agrees to go over the instructions while at home for a better understanding. Patient also instructed to self quarantine after being tested for COVID-19. The opportunity to ask questions was provided.

## 2021-12-20 ENCOUNTER — Other Ambulatory Visit: Payer: Self-pay

## 2021-12-20 ENCOUNTER — Encounter (HOSPITAL_COMMUNITY): Payer: Self-pay | Admitting: Orthopedic Surgery

## 2021-12-20 ENCOUNTER — Ambulatory Visit (HOSPITAL_COMMUNITY)
Admission: RE | Admit: 2021-12-20 | Discharge: 2021-12-20 | Disposition: A | Discharge status: Home / Self Care | Payer: PPO | Source: Ambulatory Visit | Attending: Orthopedic Surgery | Admitting: Orthopedic Surgery

## 2021-12-20 ENCOUNTER — Ambulatory Visit (HOSPITAL_COMMUNITY): Payer: PPO

## 2021-12-20 ENCOUNTER — Ambulatory Visit (HOSPITAL_COMMUNITY): Payer: PPO | Admitting: Physician Assistant

## 2021-12-20 ENCOUNTER — Ambulatory Visit (HOSPITAL_BASED_OUTPATIENT_CLINIC_OR_DEPARTMENT_OTHER): Payer: PPO | Admitting: Anesthesiology

## 2021-12-20 ENCOUNTER — Encounter (HOSPITAL_COMMUNITY)
Admission: RE | Disposition: A | Discharge status: Home / Self Care | Payer: Self-pay | Source: Ambulatory Visit | Attending: Orthopedic Surgery

## 2021-12-20 DIAGNOSIS — G709 Myoneural disorder, unspecified: Secondary | ICD-10-CM | POA: Insufficient documentation

## 2021-12-20 DIAGNOSIS — J449 Chronic obstructive pulmonary disease, unspecified: Secondary | ICD-10-CM | POA: Diagnosis not present

## 2021-12-20 DIAGNOSIS — M47816 Spondylosis without myelopathy or radiculopathy, lumbar region: Secondary | ICD-10-CM | POA: Diagnosis not present

## 2021-12-20 DIAGNOSIS — M533 Sacrococcygeal disorders, not elsewhere classified: Secondary | ICD-10-CM | POA: Insufficient documentation

## 2021-12-20 DIAGNOSIS — Z79899 Other long term (current) drug therapy: Secondary | ICD-10-CM | POA: Diagnosis not present

## 2021-12-20 DIAGNOSIS — M545 Low back pain, unspecified: Secondary | ICD-10-CM | POA: Diagnosis present

## 2021-12-20 DIAGNOSIS — F419 Anxiety disorder, unspecified: Secondary | ICD-10-CM | POA: Insufficient documentation

## 2021-12-20 DIAGNOSIS — I10 Essential (primary) hypertension: Secondary | ICD-10-CM

## 2021-12-20 DIAGNOSIS — M461 Sacroiliitis, not elsewhere classified: Secondary | ICD-10-CM

## 2021-12-20 DIAGNOSIS — F319 Bipolar disorder, unspecified: Secondary | ICD-10-CM | POA: Diagnosis not present

## 2021-12-20 DIAGNOSIS — Z87891 Personal history of nicotine dependence: Secondary | ICD-10-CM | POA: Diagnosis not present

## 2021-12-20 DIAGNOSIS — Z981 Arthrodesis status: Secondary | ICD-10-CM | POA: Diagnosis not present

## 2021-12-20 DIAGNOSIS — M797 Fibromyalgia: Secondary | ICD-10-CM | POA: Insufficient documentation

## 2021-12-20 DIAGNOSIS — M4328 Fusion of spine, sacral and sacrococcygeal region: Secondary | ICD-10-CM | POA: Diagnosis not present

## 2021-12-20 DIAGNOSIS — K219 Gastro-esophageal reflux disease without esophagitis: Secondary | ICD-10-CM | POA: Diagnosis not present

## 2021-12-20 DIAGNOSIS — E039 Hypothyroidism, unspecified: Secondary | ICD-10-CM | POA: Insufficient documentation

## 2021-12-20 HISTORY — PX: SACROILIAC JOINT FUSION: SHX6088

## 2021-12-20 SURGERY — SACROILIAC JOINT FUSION
Anesthesia: General | Laterality: Left

## 2021-12-20 MED ORDER — SUGAMMADEX SODIUM 200 MG/2ML IV SOLN
INTRAVENOUS | Status: DC | PRN
  Administered 2021-12-20: 50 mg via INTRAVENOUS
  Administered 2021-12-20: 200 mg via INTRAVENOUS

## 2021-12-20 MED ORDER — METHOCARBAMOL 750 MG PO TABS: 750.0000 mg | ORAL_TABLET | Freq: Four times a day (QID) | ORAL | 0 refills | Status: AC | PRN

## 2021-12-20 MED ORDER — FENTANYL CITRATE (PF) 100 MCG/2ML IJ SOLN
25.0000 ug | INTRAMUSCULAR | Status: DC | PRN
  Administered 2021-12-20: 25 ug via INTRAVENOUS

## 2021-12-20 MED ORDER — PROPOFOL 10 MG/ML IV BOLUS
INTRAVENOUS | Status: AC
  Filled 2021-12-20: qty 20

## 2021-12-20 MED ORDER — ACETAMINOPHEN 325 MG PO TABS: 325.0000 mg | ORAL_TABLET | ORAL | Status: DC | PRN

## 2021-12-20 MED ORDER — MIDAZOLAM HCL 5 MG/5ML IJ SOLN
INTRAMUSCULAR | Status: DC | PRN
  Administered 2021-12-20: 2 mg via INTRAVENOUS

## 2021-12-20 MED ORDER — CHLORHEXIDINE GLUCONATE 0.12 % MT SOLN
15.0000 mL | Freq: Once | OROMUCOSAL | Status: AC
  Administered 2021-12-20: 15 mL via OROMUCOSAL
  Filled 2021-12-20: qty 15

## 2021-12-20 MED ORDER — BUPIVACAINE LIPOSOME 1.3 % IJ SUSP
INTRAMUSCULAR | Status: DC | PRN
  Administered 2021-12-20: 20 mL

## 2021-12-20 MED ORDER — CEFAZOLIN SODIUM-DEXTROSE 2-4 GM/100ML-% IV SOLN
2.0000 g | INTRAVENOUS | Status: AC
  Administered 2021-12-20: 2 g via INTRAVENOUS
  Filled 2021-12-20: qty 100

## 2021-12-20 MED ORDER — FENTANYL CITRATE (PF) 250 MCG/5ML IJ SOLN
INTRAMUSCULAR | Status: DC | PRN
  Administered 2021-12-20 (×5): 50 ug via INTRAVENOUS

## 2021-12-20 MED ORDER — HYDROCODONE-ACETAMINOPHEN 5-325 MG PO TABS: 1.0000 | ORAL_TABLET | Freq: Four times a day (QID) | ORAL | 0 refills | Status: AC | PRN

## 2021-12-20 MED ORDER — ORAL CARE MOUTH RINSE: 15.0000 mL | Freq: Once | OROMUCOSAL | Status: AC

## 2021-12-20 MED ORDER — ACETAMINOPHEN 10 MG/ML IV SOLN
INTRAVENOUS | Status: AC
  Filled 2021-12-20: qty 100

## 2021-12-20 MED ORDER — BUPIVACAINE-EPINEPHRINE (PF) 0.25% -1:200000 IJ SOLN
INTRAMUSCULAR | Status: AC
  Filled 2021-12-20: qty 30

## 2021-12-20 MED ORDER — BUPIVACAINE LIPOSOME 1.3 % IJ SUSP
INTRAMUSCULAR | Status: AC
  Filled 2021-12-20: qty 20

## 2021-12-20 MED ORDER — PROPOFOL 10 MG/ML IV BOLUS
INTRAVENOUS | Status: DC | PRN
  Administered 2021-12-20: 50 mg via INTRAVENOUS
  Administered 2021-12-20: 100 mg via INTRAVENOUS
  Administered 2021-12-20: 50 mg via INTRAVENOUS

## 2021-12-20 MED ORDER — OXYCODONE HCL 5 MG/5ML PO SOLN
ORAL | Status: AC
  Filled 2021-12-20: qty 5

## 2021-12-20 MED ORDER — ONDANSETRON HCL 4 MG/2ML IJ SOLN: 4.0000 mg | Freq: Once | INTRAMUSCULAR | Status: DC | PRN

## 2021-12-20 MED ORDER — DEXAMETHASONE SODIUM PHOSPHATE 10 MG/ML IJ SOLN
INTRAMUSCULAR | Status: DC | PRN
  Administered 2021-12-20: 4 mg via INTRAVENOUS

## 2021-12-20 MED ORDER — MIDAZOLAM HCL 2 MG/2ML IJ SOLN
INTRAMUSCULAR | Status: AC
  Filled 2021-12-20: qty 2

## 2021-12-20 MED ORDER — FENTANYL CITRATE (PF) 250 MCG/5ML IJ SOLN
INTRAMUSCULAR | Status: AC
  Filled 2021-12-20: qty 5

## 2021-12-20 MED ORDER — FENTANYL CITRATE (PF) 100 MCG/2ML IJ SOLN
INTRAMUSCULAR | Status: AC
  Filled 2021-12-20: qty 2

## 2021-12-20 MED ORDER — ACETAMINOPHEN 160 MG/5ML PO SOLN: 325.0000 mg | ORAL | Status: DC | PRN

## 2021-12-20 MED ORDER — MEPERIDINE HCL 25 MG/ML IJ SOLN: 6.2500 mg | INTRAMUSCULAR | Status: DC | PRN

## 2021-12-20 MED ORDER — OXYCODONE HCL 5 MG PO TABS: 5.0000 mg | ORAL_TABLET | Freq: Once | ORAL | Status: AC | PRN

## 2021-12-20 MED ORDER — 0.9 % SODIUM CHLORIDE (POUR BTL) OPTIME
TOPICAL | Status: DC | PRN
  Administered 2021-12-20: 1000 mL

## 2021-12-20 MED ORDER — LACTATED RINGERS IV SOLN: INTRAVENOUS | Status: DC

## 2021-12-20 MED ORDER — DEXAMETHASONE SODIUM PHOSPHATE 10 MG/ML IJ SOLN
INTRAMUSCULAR | Status: AC
  Filled 2021-12-20: qty 1

## 2021-12-20 MED ORDER — BUPIVACAINE-EPINEPHRINE (PF) 0.25% -1:200000 IJ SOLN
INTRAMUSCULAR | Status: DC | PRN
  Administered 2021-12-20: 27 mL

## 2021-12-20 MED ORDER — ROCURONIUM BROMIDE 10 MG/ML (PF) SYRINGE
PREFILLED_SYRINGE | INTRAVENOUS | Status: DC | PRN
  Administered 2021-12-20: 70 mg via INTRAVENOUS

## 2021-12-20 MED ORDER — ONDANSETRON HCL 4 MG/2ML IJ SOLN
INTRAMUSCULAR | Status: AC
  Filled 2021-12-20: qty 2

## 2021-12-20 MED ORDER — POVIDONE-IODINE 7.5 % EX SOLN
Freq: Once | CUTANEOUS | Status: DC
  Filled 2021-12-20: qty 118

## 2021-12-20 MED ORDER — OXYCODONE HCL 5 MG/5ML PO SOLN
5.0000 mg | Freq: Once | ORAL | Status: AC | PRN
  Administered 2021-12-20: 5 mg via ORAL

## 2021-12-20 MED ORDER — LIDOCAINE 2% (20 MG/ML) 5 ML SYRINGE
INTRAMUSCULAR | Status: AC
  Filled 2021-12-20: qty 5

## 2021-12-20 MED ORDER — PROPOFOL 500 MG/50ML IV EMUL
INTRAVENOUS | Status: DC | PRN
  Administered 2021-12-20: 150 ug/kg/min via INTRAVENOUS

## 2021-12-20 MED ORDER — LIDOCAINE 2% (20 MG/ML) 5 ML SYRINGE
INTRAMUSCULAR | Status: DC | PRN
  Administered 2021-12-20: 100 mg via INTRAVENOUS

## 2021-12-20 SURGICAL SUPPLY — 68 items
APL SKNCLS STERI-STRIP NONHPOA (GAUZE/BANDAGES/DRESSINGS) ×1
BAG COUNTER SPONGE SURGICOUNT (BAG) ×2 IMPLANT
BAG SPNG CNTER NS LX DISP (BAG) ×1
BENZOIN TINCTURE PRP APPL 2/3 (GAUZE/BANDAGES/DRESSINGS) ×2 IMPLANT
BIT DRILL CANNULATED 7.0X3.2MM (BIT) IMPLANT
BLADE CLIPPER SURG (BLADE) ×2 IMPLANT
BLADE SURG 10 STRL SS (BLADE) ×2 IMPLANT
CANISTER SUCT 3000ML PPV (MISCELLANEOUS) ×2 IMPLANT
CLOSURE STERI STRIP 1/2 X4 (GAUZE/BANDAGES/DRESSINGS) IMPLANT
COVER SURGICAL LIGHT HANDLE (MISCELLANEOUS) ×4 IMPLANT
DRAPE C-ARM 42X72 X-RAY (DRAPES) ×2 IMPLANT
DRAPE C-ARMOR (DRAPES) ×2 IMPLANT
DRAPE INCISE IOBAN 66X45 STRL (DRAPES) ×2 IMPLANT
DRAPE POUCH INSTRU U-SHP 10X18 (DRAPES) ×2 IMPLANT
DRAPE SURG 17X23 STRL (DRAPES) ×6 IMPLANT
DRILL CANNULATED 7.0X3.2MM (BIT) ×1
DURAPREP 26ML APPLICATOR (WOUND CARE) ×2 IMPLANT
ELECT CAUTERY BLADE 6.4 (BLADE) ×2 IMPLANT
ELECT REM PT RETURN 9FT ADLT (ELECTROSURGICAL) ×1
ELECTRODE REM PT RTRN 9FT ADLT (ELECTROSURGICAL) ×2 IMPLANT
GAUZE 4X4 16PLY ~~LOC~~+RFID DBL (SPONGE) ×2 IMPLANT
GAUZE SPONGE 4X4 12PLY STRL (GAUZE/BANDAGES/DRESSINGS) ×2 IMPLANT
GLOVE BIO SURGEON STRL SZ7 (GLOVE) ×2 IMPLANT
GLOVE BIO SURGEON STRL SZ8 (GLOVE) ×2 IMPLANT
GLOVE BIOGEL PI IND STRL 7.0 (GLOVE) ×2 IMPLANT
GLOVE BIOGEL PI IND STRL 8 (GLOVE) ×2 IMPLANT
GLOVE SURG ENC MOIS LTX SZ6.5 (GLOVE) ×2 IMPLANT
GOWN STRL REUS W/ TWL LRG LVL3 (GOWN DISPOSABLE) ×4 IMPLANT
GOWN STRL REUS W/ TWL XL LVL3 (GOWN DISPOSABLE) ×2 IMPLANT
GOWN STRL REUS W/TWL LRG LVL3 (GOWN DISPOSABLE) ×2
GOWN STRL REUS W/TWL XL LVL3 (GOWN DISPOSABLE) ×1
IMPL IFUSE 7.0MMX55MM (Rod) IMPLANT
IMPL IFUSE 7.0X45 (Rod) IMPLANT
IMPL IFUSE 7.0X50 (Rod) IMPLANT
IMPLANT IFUSE 7.0MMX55MM (Rod) ×1 IMPLANT
IMPLANT IFUSE 7.0X45 (Rod) ×1 IMPLANT
IMPLANT IFUSE 7.0X50 (Rod) ×1 IMPLANT
KIT BASIN OR (CUSTOM PROCEDURE TRAY) ×2 IMPLANT
KIT TURNOVER KIT B (KITS) ×2 IMPLANT
MANIFOLD NEPTUNE II (INSTRUMENTS) ×2 IMPLANT
NDL 22X1.5 STRL (OR ONLY) (MISCELLANEOUS) ×2 IMPLANT
NDL HYPO 25GX1X1/2 BEV (NEEDLE) ×2 IMPLANT
NEEDLE 22X1.5 STRL (OR ONLY) (MISCELLANEOUS) ×1 IMPLANT
NEEDLE HYPO 25GX1X1/2 BEV (NEEDLE) ×1 IMPLANT
NS IRRIG 1000ML POUR BTL (IV SOLUTION) ×2 IMPLANT
PACK UNIVERSAL I (CUSTOM PROCEDURE TRAY) ×2 IMPLANT
PAD ARMBOARD 7.5X6 YLW CONV (MISCELLANEOUS) ×4 IMPLANT
PENCIL BUTTON HOLSTER BLD 10FT (ELECTRODE) ×2 IMPLANT
PIN EXCHANGE IFUSE DISP 3.2 (PIN) IMPLANT
PIN STEINMAN (PIN) IMPLANT
PIN STEINMAN 3.2MM (PIN) IMPLANT
SPONGE T-LAP 18X18 ~~LOC~~+RFID (SPONGE) ×2 IMPLANT
STAPLER VISISTAT 35W (STAPLE) ×2 IMPLANT
STRIP CLOSURE SKIN 1/2X4 (GAUZE/BANDAGES/DRESSINGS) ×2 IMPLANT
SUT MNCRL AB 4-0 PS2 18 (SUTURE) ×2 IMPLANT
SUT VIC AB 0 CT1 18XCR BRD 8 (SUTURE) IMPLANT
SUT VIC AB 0 CT1 8-18 (SUTURE) ×1
SUT VIC AB 1 CT1 18XCR BRD 8 (SUTURE) ×2 IMPLANT
SUT VIC AB 1 CT1 8-18 (SUTURE) ×1
SUT VIC AB 2-0 CT2 18 VCP726D (SUTURE) ×2 IMPLANT
SYR BULB IRRIG 60ML STRL (SYRINGE) ×4 IMPLANT
SYR CONTROL 10ML LL (SYRINGE) ×2 IMPLANT
TAPE CLOTH SURG 4X10 WHT LF (GAUZE/BANDAGES/DRESSINGS) IMPLANT
TOWEL GREEN STERILE (TOWEL DISPOSABLE) ×4 IMPLANT
TOWEL GREEN STERILE FF (TOWEL DISPOSABLE) ×2 IMPLANT
TUBE CONNECTING 12X1/4 (SUCTIONS) ×2 IMPLANT
WATER STERILE IRR 1000ML POUR (IV SOLUTION) ×2 IMPLANT
YANKAUER SUCT BULB TIP NO VENT (SUCTIONS) ×2 IMPLANT

## 2021-12-20 NOTE — Anesthesia Preprocedure Evaluation (Addendum)
Anesthesia Evaluation  Patient identified by MRN, date of birth, ID band Patient awake    Reviewed: Allergy & Precautions, H&P , NPO status , Patient's Chart, lab work & pertinent test results  History of Anesthesia Complications (+) PONV and history of anesthetic complications  Airway Mallampati: II  TM Distance: >3 FB Neck ROM: Full    Dental no notable dental hx. (+) Teeth Intact, Dental Advisory Given, Caps   Pulmonary neg pulmonary ROS, COPD, former smoker   Pulmonary exam normal breath sounds clear to auscultation       Cardiovascular Exercise Tolerance: Good hypertension, Pt. on medications negative cardio ROS Normal cardiovascular exam Rhythm:Regular Rate:Normal     Neuro/Psych  PSYCHIATRIC DISORDERS Anxiety Depression Bipolar Disorder    Neuromuscular disease negative neurological ROS  negative psych ROS   GI/Hepatic negative GI ROS, Neg liver ROS,GERD  Medicated,,  Endo/Other  negative endocrine ROSHypothyroidism    Renal/GU negative Renal ROS  negative genitourinary   Musculoskeletal negative musculoskeletal ROS (+) Arthritis , Osteoarthritis,  Fibromyalgia -  Abdominal   Peds negative pediatric ROS (+)  Hematology negative hematology ROS (+) Blood dyscrasia, anemia   Anesthesia Other Findings   Reproductive/Obstetrics negative OB ROS                              Anesthesia Physical Anesthesia Plan  ASA: 3  Anesthesia Plan: General   Post-op Pain Management: Minimal or no pain anticipated   Induction: Intravenous  PONV Risk Score and Plan: 4 or greater and Ondansetron, Treatment may vary due to age or medical condition, TIVA and Scopolamine patch - Pre-op  Airway Management Planned: Oral ETT and LMA  Additional Equipment: None  Intra-op Plan:   Post-operative Plan: Extubation in OR  Informed Consent: I have reviewed the patients History and Physical, chart,  labs and discussed the procedure including the risks, benefits and alternatives for the proposed anesthesia with the patient or authorized representative who has indicated his/her understanding and acceptance.       Plan Discussed with: Anesthesiologist and CRNA  Anesthesia Plan Comments: (  )         Anesthesia Quick Evaluation

## 2021-12-20 NOTE — Anesthesia Postprocedure Evaluation (Signed)
Anesthesia Post Note  Patient: Tiffaney Heimann  Procedure(s) Performed: LEFT-SIDED SACROILIAC JOINT FUSION (Left)     Patient location during evaluation: PACU Anesthesia Type: General Level of consciousness: awake and alert Pain management: pain level controlled Vital Signs Assessment: post-procedure vital signs reviewed and stable Respiratory status: spontaneous breathing, nonlabored ventilation, respiratory function stable and patient connected to nasal cannula oxygen Cardiovascular status: blood pressure returned to baseline and stable Postop Assessment: no apparent nausea or vomiting Anesthetic complications: no   No notable events documented.  Last Vitals:  Vitals:   12/20/21 1445 12/20/21 1500  BP: (!) 130/42 139/62  Pulse: (!) 53 (!) 50  Resp: 12 12  Temp:  (!) 36.3 C  SpO2: 100% 99%    Last Pain:  Vitals:   12/20/21 1349  TempSrc:   PainSc: 0-No pain                 Rahshawn Remo

## 2021-12-20 NOTE — H&P (Signed)
PREOPERATIVE H&P  Chief Complaint: Low back pain  HPI: [name] is a 61 y.o. female who presents with ongoing pain in the low back on the left  Patient's history and exam c/w left SI joitn dysfunction  Patient has failed multiple forms of conservative care and continues to have pain (see office notes for additional details regarding the patient's full course of treatment)  Past Medical History:  Diagnosis Date   ADD (attention deficit disorder)    Anxiety    Arthritis    Bladder neoplasm    Cancer ([place]) 2015   Bladder cancer   COPD (chronic obstructive pulmonary disease) ([place])    Depression    Fibroids    Fibromyalgia    H/O bronchitis    treated within the last week w steroids, antibx tessalon pearls.    H/O sebaceous cyst 07/18/2014   left breast    Hemorrhoid    History of melanoma excision    leg   Hyperlipidemia    Hypertension    normal now, only took medications for 1 month- thinks it was from pain.   Hypothyroidism    Iron deficiency anemia    PONV (postoperative nausea and vomiting)    used a scop patch last surg-did well   Pre-diabetes    Prediabetes    Restless leg syndrome    Sciatica of right side 10/13/2019   Shortness of breath dyspnea    with exertion   Shoulder impingement    right   SUI (stress urinary incontinence, female)    Wears glasses    Past Surgical History:  Procedure Laterality Date   ABDOMINAL HYSTERECTOMY  2017   ANKLE FUSION Right 04/08/2009   ANTERIOR CERVICAL DECOMP/DISCECTOMY FUSION N/A 09/01/2013   Procedure: Anterior cervical decompression fusion, cervical 5-6 with instrumentation and allograft;  Surgeon: Sinclair Ship, MD;  Location: [place];  Service: Orthopedics;  Laterality: N/A;  Anterior cervical decompression fusion, cervical 5-6 with instrumentation and allograft   ANTERIOR CERVICAL DECOMP/DISCECTOMY FUSION N/A 08/02/2015   Procedure: ANTERIOR CERVICAL DECOMPRESSION FUSION CERVICAL 6 - THORACIC 1  WITH INSTRUMENTATION AND ALLOGRAFT;  Surgeon: Phylliss Bob, MD;  Location: [place];  Service: Orthopedics;  Laterality: N/A;  ANTERIOR CERVICAL DECOMPRESSION FUSION, CERVICAL 6-7,CERVICAL 7-THORACIC 7 WITH INSTRUMENTATION AND ALLOGRAFT   CATARACT EXTRACTION W/ INTRAOCULAR LENS IMPLANT Bilateral    CLOSED REDUCTION AND PINNING LEFT FIFTH  PROXIMAL PHALANGEAL FX  10/09/2005   COLONOSCOPY  2021   CYSTOSCOPY W/ RETROGRADES Left 01/11/2014   Procedure: CYSTOSCOPY WITH RETROGRADE PYELOGRAM;  Surgeon: Festus Aloe, MD;  [facility];  Service: Urology;  Laterality: Left;   CYSTOSCOPY WITH STENT PLACEMENT Left 01/11/2014   Procedure: CYSTOSCOPY WITH STENT PLACEMENT;  Surgeon: Festus Aloe, MD;  [facility];  Service: Urology;  Laterality: Left;   D & C HYSTEROSCOPY WITH NOVASURE ENDOMETRIAL ABLATION  07/16/2006   HIP ARTHROPLASTY Right 2019   KNEE ARTHROSCOPY WITH MEDIAL MENISECTOMY Right 05/22/2012   Procedure: KNEE ARTHROSCOPY WITH MEDIAL MENISECTOMY and Medial Plica excision;  Surgeon: Alta Corning, MD;  Location: [place];  Service: Orthopedics;  Laterality: Right;   LAPAROSCOPIC ASSISTED VAGINAL HYSTERECTOMY N/A 08/29/2014   Procedure: LAPAROSCOPIC ASSISTED VAGINAL HYSTERECTOMY;  Surgeon: Molli Posey, MD;  Location: [place];  Service: Gynecology;  Laterality: N/A;   LAPAROSCOPIC CHOLECYSTECTOMY  04/09/2003   LAPAROTOMY N/A 08/29/2014   Procedure: EXPLORATORY LAPAROTOMY WITH LIGATION OF BLEEDER;  Surgeon: Molli Posey, MD;  Location: [facility];  Service: Gynecology;  Laterality: N/A;   POSTERIOR CERVICAL FUSION/FORAMINOTOMY N/A 01/25/2015   Procedure: POSTERIOR CERVICAL DECOMPRESSSION CERVICAL 7-THORACIC 1;  Surgeon: Phylliss Bob, MD;  Location: [place];  Service: Orthopedics;  Laterality: N/A;  Posterior cervical decompression, cervical 5-6, cervical 6-7, cervical 7-thoracic 1    PUBOVAGINAL SLING N/A  08/29/2014   Procedure: SOLYX SINGLE INCISION SLING;  Surgeon: Molli Posey, MD;  Location: [place];  Service: Gynecology;  Laterality: N/A;   SALPINGOOPHORECTOMY Bilateral 08/29/2014   Procedure: SALPINGO OOPHORECTOMY;  Surgeon: Molli Posey, MD;  Location: [facility];  Service: Gynecology;  Laterality: Bilateral;   SHOULDER ARTHROSCOPY W/ SUBACROMIAL DECOMPRESSION AND DISTAL CLAVICLE EXCISION Left 11/27/2011   W/  DEBRIDEMENT   SHOULDER ARTHROSCOPY WITH BICEPSTENOTOMY Right 12/21/2014   Procedure: SHOULDER ARTHROSCOPY WITH BICEPSTENOTOMY;  Surgeon: Dorna Leitz, MD;  Location: [place];  Service: Orthopedics;  Laterality: Right;   SHOULDER ARTHROSCOPY WITH DISTAL CLAVICLE RESECTION Right 12/21/2014   Procedure: SHOULDER ARTHROSCOPY WITH DISTAL CLAVICLE RESECTION;  Surgeon: Dorna Leitz, MD;  Location: [place];  Service: Orthopedics;  Laterality: Right;   SHOULDER ARTHROSCOPY WITH OPEN ROTATOR CUFF REPAIR Right 07/12/2015   Procedure: SHOULDER ARTHROSCOPY WITH ACROMIOPLASTY AND OPEN ROTATOR CUFF REPAIR;  Surgeon: Dorna Leitz, MD;  Location: [place];  Service: Orthopedics;  Laterality: Right;   SHOULDER ARTHROSCOPY WITH SUBACROMIAL DECOMPRESSION Left 11/10/2013   Procedure: SHOULDER ARTHROSCOPY WITH SUBACROMIAL DECOMPRESSION WITH ROTATOR CUFF REPAIR AND BICEPS TENODESIS;  Surgeon: Alta Corning, MD;  Location: [place];  Service: Orthopedics;  Laterality: Left;   SHOULDER ARTHROSCOPY WITH SUBACROMIAL DECOMPRESSION Right 12/21/2014   Procedure: SHOULDER ARTHROSCOPY WITH SUBACROMIAL DECOMPRESSION;  Surgeon: Dorna Leitz, MD;  Location: [place];  Service: Orthopedics;  Laterality: Right;   TONSILLECTOMY  age 12   TOTAL HIP ARTHROPLASTY Left 11/30/2020   Procedure: TOTAL HIP ARTHROPLASTY ANTERIOR APPROACH;  Surgeon: Rod Can, MD;  Location: [facility];  Service: Orthopedics;   Laterality: Left;  177mn   TRANSURETHRAL RESECTION OF BLADDER TUMOR N/A 01/11/2014   Procedure: TRANSURETHRAL RESECTION OF BLADDER TUMOR (TURBT);  Surgeon: MFestus Aloe MD;  [facility]  Service: Urology;  Laterality: N/A;   TUBAL LIGATION  04/08/1978   Social History   Socioeconomic History   Marital status: Married    Spouse name: Not on file   Number of children: 2   Years of education: Not on file   Highest education level: Not on file  Occupational History   Occupation: logistic specialist    Employer: [employer]   Tobacco Use   Smoking status: Former    Packs/day: 1.00    Years: 30.00    Total pack years: 30.00    Types: Cigarettes    Quit date: 2015    Years since quitting: 8.7   Smokeless tobacco: Never  Vaping Use   Vaping Use: Never used  Substance and Sexual Activity   Alcohol use: No   Drug use: No   Sexual activity: Not on file  Other Topics Concern   Not on file  Social History Narrative   Lives with husband in a one story home.  Has 2 children.     Not currently working.  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   Trying to get disability.     Education: 10th grade.   Social Determinants of Health   Financial Resource Strain: Not on  file  Food Insecurity: Not on file  Transportation Needs: Not on file  Physical Activity: Not on file  Stress: Not on file  Social Connections: Not on file   Family History  Problem Relation Age of Onset   Cancer Father        Lung and stomach    Heart disease Father    Hypertension Father    Stroke Father    Heart failure Paternal Grandfather    Cancer Paternal Grandfather        colon cancer   Ovarian cancer Mother 10   Breast cancer Mother        dx in her late 75s-50s   Thyroid cancer Mother    Ovarian cancer Sister 77   Cancer Brother        abdominal tumor   Ovarian cancer Maternal Aunt    Breast cancer Maternal Aunt    Breast cancer  Maternal Uncle    Breast cancer Paternal Aunt    Ovarian cancer Sister 51   Ovarian cancer Sister 20   Cancer Brother        abdominal tumor   Breast cancer Paternal Aunt    Breast cancer Paternal Aunt    Breast cancer Paternal Aunt    Breast cancer Cousin    Breast cancer Cousin    Allergies  Allergen Reactions   Sulfa Antibiotics Hives and Itching   Prior to Admission medications   Medication Sig Start Date End Date Taking? Authorizing Provider  albuterol (VENTOLIN HFA) 108 (90 Base) MCG/ACT inhaler Inhale 2 puffs into the lungs See admin instructions. Inhale 2 puffs (scheduled) in the morning & every 6 hours as needed for wheezing/shortness of breath. 01/13/17  Yes [provider]  amphetamine-dextroamphetamine (ADDERALL) 20 MG tablet Take 1 tablet (20 mg total) daily by mouth. 02/14/17  Yes Vladimir Crofts, PA-C  aspirin EC 81 MG tablet Take 81 mg by mouth daily. Swallow whole.   Yes [provider]  calcium carbonate (OSCAL) 1500 (600 Ca) MG TABS tablet Take 600 mg of elemental calcium by mouth every evening.   Yes [provider]  Cholecalciferol (VITAMIN D-3) 125 MCG (5000 UT) TABS Take 5,000 Units by mouth in the morning.   Yes [provider]  clonazePAM (KLONOPIN) 2 MG tablet Take 1 tablet (2 mg total) by mouth at bedtime. for anxiety 01/13/17  Yes Liane Comber, NP  cyanocobalamin (VITAMIN B12) 1000 MCG tablet Take 1,000 mcg by mouth daily.   Yes [provider]  docusate sodium (COLACE) 100 MG capsule Take 1 capsule (100 mg total) by mouth 2 (two) times daily. Patient taking differently: Take 100 mg by mouth at bedtime. 12/01/20  Yes Dorothyann Peng, PA-C  escitalopram (LEXAPRO) 20 MG tablet Take 20 mg by mouth every evening. 08/29/20  Yes [provider]  gabapentin (NEURONTIN) 100 MG capsule Take 1 capsule (100 mg total) by mouth 3 (three) times daily as needed. Fibromyalgia pain Patient taking differently: Take 100 mg  by mouth 2 (two) times daily. Fibromyalgia pain 01/13/17  Yes Unk Pinto, MD  magnesium oxide (MAG-OX) 400 MG tablet Take 400 mg by mouth daily.   Yes [provider]  naproxen (NAPROSYN) 500 MG tablet Take 1 tablet (500 mg total) by mouth 2 (two) times daily. 06/09/21  Yes Henderly, Britni A, PA-C  Polyethyl Glycol-Propyl Glycol (LUBRICANT EYE DROPS) 0.4-0.3 % SOLN Place 1-2 drops into both eyes 3 (three) times daily as needed (dry/irritated eyes).  Yes [provider]  tiotropium (SPIRIVA) 18 MCG inhalation capsule Place 18 mcg into inhaler and inhale daily.   Yes [provider]  traMADol (ULTRAM) 50 MG tablet Take 50 mg by mouth daily. 07/05/21  Yes [provider]  lidocaine (LIDODERM) 5 % Place 1 patch onto the skin daily. Remove & Discard patch within 12 hours or as directed by MD Patient not taking: Reported on 12/12/2021 06/09/21   Henderly, Britni A, PA-C  methocarbamol (ROBAXIN) 500 MG tablet Take 1 tablet (500 mg total) by mouth 2 (two) times daily. Patient not taking: Reported on 12/12/2021 06/09/21   Henderly, Britni A, PA-C  oxyCODONE (ROXICODONE) 5 MG immediate release tablet Take 1 tablet (5 mg total) by mouth every 4 (four) hours as needed for severe pain. Patient not taking: Reported on 12/12/2021 06/09/21   Henderly, Britni A, PA-C     All other systems have been reviewed and were otherwise negative with the exception of those mentioned in the HPI and as above.  Physical Exam: There were no vitals filed for this visit.  There is no height or weight on file to calculate BMI.  General: Alert, no acute distress Cardiovascular: No pedal edema Respiratory: No cyanosis, no use of accessory musculature Skin: No lesions in the area of chief complaint Neurologic: Sensation intact distally Psychiatric: Patient is competent for consent with normal mood and affect Lymphatic: No axillary or cervical lymphadenopathy   Assessment/Plan: Left sacroiliac  joint dysfunction Plan for Procedure(s): LEFT-SIDED SACROILIAC JOINT FUSION   Norva Karvonen, MD 12/20/2021 6:42 AM

## 2021-12-20 NOTE — Anesthesia Procedure Notes (Signed)
Procedure Name: Intubation Date/Time: 12/20/2021 12:20 PM  Performed by: Jenne Campus, CRNAPre-anesthesia Checklist: Patient identified, Emergency Drugs available, Suction available and Patient being monitored Patient Re-evaluated:Patient Re-evaluated prior to induction Oxygen Delivery Method: Circle System Utilized Preoxygenation: Pre-oxygenation with 100% oxygen Induction Type: IV induction Ventilation: Mask ventilation without difficulty Laryngoscope Size: Miller and 3 Grade View: Grade I Tube type: Oral Tube size: 7.0 mm Number of attempts: 1 Airway Equipment and Method: Stylet and Oral airway Placement Confirmation: ETT inserted through vocal cords under direct vision, positive ETCO2 and breath sounds checked- equal and bilateral Secured at: 21 cm Tube secured with: Tape Dental Injury: Teeth and Oropharynx as per pre-operative assessment

## 2021-12-20 NOTE — Transfer of Care (Signed)
Immediate Anesthesia Transfer of Care Note  Patient: [name]  Procedure(s) Performed: LEFT-SIDED SACROILIAC JOINT FUSION (Left)  Patient Location: PACU  Anesthesia Type:General  Level of Consciousness: oriented, drowsy and patient cooperative  Airway & Oxygen Therapy: Patient Spontanous Breathing and Patient connected to nasal cannula oxygen  Post-op Assessment: Report given to RN and Post -op Vital signs reviewed and stable  Post vital signs: Reviewed  Last Vitals:  Vitals Value Taken Time  BP 111/53 12/20/21 1349  Temp    Pulse 51 12/20/21 1352  Resp 15 12/20/21 1352  SpO2 99 % 12/20/21 1352  Vitals shown include unvalidated device data.  Last Pain:  Vitals:   12/20/21 0836  TempSrc:   PainSc: 6       Patients Stated Pain Goal: 2 (82/42/35 3614)  Complications: No notable events documented.

## 2021-12-20 NOTE — Op Note (Signed)
PATIENT NAME: [name]   MEDICAL RECORD NO.:   [id number]    DATE OF BIRTH: 1961/01/01    DATE OF PROCEDURE: 12/20/2021                                OPERATIVE REPORT     PREOPERATIVE DIAGNOSES:   Left sacroiliac joint dysfunction   POSTOPERATIVE DIAGNOSES: Left sacroiliac joint dysfunction   PROCEDURE: Minimally invasive left sacroiliac joint fusion   SURGEON:  Phylliss Bob, MD.   ASSISTANTPricilla Holm, PA-C.   ANESTHESIA:  General endotracheal anesthesia.   COMPLICATIONS:  None.   DISPOSITION:  Stable.   ESTIMATED BLOOD LOSS:  Minimal.   INDICATIONS FOR SURGERY:  Briefly, Ms. Plake is a pleasant 61 year old female, who did present to me with ongoing severe pain at the left side of her low back.  Her work-up was diagnostic for left sacroiliac joint dysfunction.  She did fail appropriate conservative treatment measures, and as such, we did discuss proceeding with the surgery noted above. The patient was made fully aware of the risks and recovery period associated with surgery, and she did wish to proceed.   OPERATIVE DETAILS:  On 12/20/2021, the patient was brought to surgery and general endotracheal anesthesia was administered.  The patient was placed prone on a flat Jackson bed, with gel rolls placed beneath the patient's chest and hips.  The region of the left buttock was prepped and draped in the usual sterile fashion.  A timeout procedure was performed.  Fluoroscopy was brought into the field.  I was able to ensure adequate lateral, inlet, and outlet radiographs.  At this point, a 3 cm incision was made in line with the posterior border of the sacrum on the left.  3 guidewires were advanced across the sacroiliac joint on the left side.  Fluoroscopy was liberally used while advancing the guidewires in order to ensure a safe trajectory of the guidewires..  I then drilled and broached over the guidewires.  At this point, 7 mm implants were advanced across the  sacroiliac joints from superiorly to inferiorly, the length of the implants were 55 mm, 45 mm, and 50 mm.  I was very pleased with the resting position of the implants on lateral, inlet, and outlet fluoroscopy.  The guidewires were then removed.  I was very pleased with the final lateral, inlet, and outlet radiographs.  At this point, the wound was copiously irrigated and closed in layers, using #1 Vicryl, followed by 2-0 Vicryl, followed by 4-0 Monocryl.  All instrument counts were correct at the termination of the procedure.     Of note, Pricilla Holm was my assistant throughout surgery, and did aid in retraction, placement of the hardware, suctioning, and closure from start to finish.     Phylliss Bob, MD

## 2021-12-23 ENCOUNTER — Encounter (HOSPITAL_COMMUNITY): Payer: Self-pay | Admitting: Orthopedic Surgery

## 2022-01-15 DIAGNOSIS — U071 COVID-19: Secondary | ICD-10-CM | POA: Diagnosis not present

## 2022-01-15 DIAGNOSIS — J029 Acute pharyngitis, unspecified: Secondary | ICD-10-CM | POA: Diagnosis not present

## 2022-02-01 DIAGNOSIS — M533 Sacrococcygeal disorders, not elsewhere classified: Secondary | ICD-10-CM | POA: Diagnosis not present

## 2022-03-20 DIAGNOSIS — F411 Generalized anxiety disorder: Secondary | ICD-10-CM | POA: Diagnosis not present

## 2022-03-20 DIAGNOSIS — G2581 Restless legs syndrome: Secondary | ICD-10-CM | POA: Diagnosis not present

## 2022-03-20 DIAGNOSIS — J410 Simple chronic bronchitis: Secondary | ICD-10-CM | POA: Diagnosis not present

## 2022-03-20 DIAGNOSIS — M797 Fibromyalgia: Secondary | ICD-10-CM | POA: Diagnosis not present

## 2022-03-20 DIAGNOSIS — F321 Major depressive disorder, single episode, moderate: Secondary | ICD-10-CM | POA: Diagnosis not present

## 2022-03-20 DIAGNOSIS — R7303 Prediabetes: Secondary | ICD-10-CM | POA: Diagnosis not present

## 2022-03-20 DIAGNOSIS — E7800 Pure hypercholesterolemia, unspecified: Secondary | ICD-10-CM | POA: Diagnosis not present

## 2022-03-20 DIAGNOSIS — E559 Vitamin D deficiency, unspecified: Secondary | ICD-10-CM | POA: Diagnosis not present

## 2022-03-20 DIAGNOSIS — E669 Obesity, unspecified: Secondary | ICD-10-CM | POA: Diagnosis not present

## 2022-03-20 DIAGNOSIS — Z8639 Personal history of other endocrine, nutritional and metabolic disease: Secondary | ICD-10-CM | POA: Diagnosis not present

## 2022-03-20 DIAGNOSIS — R7309 Other abnormal glucose: Secondary | ICD-10-CM | POA: Diagnosis not present

## 2022-03-20 DIAGNOSIS — F988 Other specified behavioral and emotional disorders with onset usually occurring in childhood and adolescence: Secondary | ICD-10-CM | POA: Diagnosis not present

## 2022-09-13 DIAGNOSIS — Z96642 Presence of left artificial hip joint: Secondary | ICD-10-CM | POA: Diagnosis not present

## 2022-09-30 DIAGNOSIS — Z96642 Presence of left artificial hip joint: Secondary | ICD-10-CM | POA: Diagnosis not present

## 2022-09-30 DIAGNOSIS — Z1231 Encounter for screening mammogram for malignant neoplasm of breast: Secondary | ICD-10-CM | POA: Diagnosis not present

## 2022-10-01 ENCOUNTER — Other Ambulatory Visit (HOSPITAL_COMMUNITY): Payer: Self-pay | Admitting: Orthopedic Surgery

## 2022-10-01 DIAGNOSIS — Z96642 Presence of left artificial hip joint: Secondary | ICD-10-CM

## 2022-10-07 ENCOUNTER — Encounter (HOSPITAL_COMMUNITY)
Admission: RE | Admit: 2022-10-07 | Discharge: 2022-10-07 | Disposition: A | Discharge status: Home / Self Care | Payer: PPO | Source: Ambulatory Visit | Attending: Orthopedic Surgery | Admitting: Orthopedic Surgery

## 2022-10-07 DIAGNOSIS — Z96642 Presence of left artificial hip joint: Secondary | ICD-10-CM | POA: Insufficient documentation

## 2022-10-07 DIAGNOSIS — Z96643 Presence of artificial hip joint, bilateral: Secondary | ICD-10-CM | POA: Diagnosis not present

## 2022-10-07 DIAGNOSIS — M25552 Pain in left hip: Secondary | ICD-10-CM | POA: Diagnosis not present

## 2022-10-07 MED ORDER — TECHNETIUM TC 99M MEDRONATE IV KIT
20.9000 | PACK | Freq: Once | INTRAVENOUS | Status: AC | PRN
  Administered 2022-10-07: 20.9 via INTRAVENOUS

## 2022-10-18 DIAGNOSIS — Z0000 Encounter for general adult medical examination without abnormal findings: Secondary | ICD-10-CM | POA: Diagnosis not present

## 2022-10-18 DIAGNOSIS — F321 Major depressive disorder, single episode, moderate: Secondary | ICD-10-CM | POA: Diagnosis not present

## 2022-10-18 DIAGNOSIS — E669 Obesity, unspecified: Secondary | ICD-10-CM | POA: Diagnosis not present

## 2022-10-18 DIAGNOSIS — E559 Vitamin D deficiency, unspecified: Secondary | ICD-10-CM | POA: Diagnosis not present

## 2022-10-18 DIAGNOSIS — G2581 Restless legs syndrome: Secondary | ICD-10-CM | POA: Diagnosis not present

## 2022-10-18 DIAGNOSIS — R0789 Other chest pain: Secondary | ICD-10-CM | POA: Diagnosis not present

## 2022-10-18 DIAGNOSIS — M797 Fibromyalgia: Secondary | ICD-10-CM | POA: Diagnosis not present

## 2022-10-18 DIAGNOSIS — E7800 Pure hypercholesterolemia, unspecified: Secondary | ICD-10-CM | POA: Diagnosis not present

## 2022-10-18 DIAGNOSIS — Z8639 Personal history of other endocrine, nutritional and metabolic disease: Secondary | ICD-10-CM | POA: Diagnosis not present

## 2022-10-18 DIAGNOSIS — F419 Anxiety disorder, unspecified: Secondary | ICD-10-CM | POA: Diagnosis not present

## 2022-10-18 DIAGNOSIS — R7303 Prediabetes: Secondary | ICD-10-CM | POA: Diagnosis not present

## 2022-10-18 DIAGNOSIS — Z1331 Encounter for screening for depression: Secondary | ICD-10-CM | POA: Diagnosis not present

## 2022-10-31 ENCOUNTER — Other Ambulatory Visit (INDEPENDENT_AMBULATORY_CARE_PROVIDER_SITE_OTHER): Payer: PPO

## 2022-10-31 ENCOUNTER — Ambulatory Visit: Payer: PPO | Admitting: Physician Assistant

## 2022-10-31 DIAGNOSIS — M25552 Pain in left hip: Secondary | ICD-10-CM

## 2022-10-31 NOTE — Progress Notes (Signed)
Office Visit Note   Patient: [name]           Date of Birth: 05/27/1960           MRN: [id number] Visit Date: 10/31/2022              Requested by: Wilfrid Lund, [place] [address] [place],  [place] [address] PCP: Wilfrid Lund, PA   Assessment & Plan: Visit Diagnoses:  1. Pain in left hip     Plan: Impression is chronic left groin pain following total hip replacement in August 2022 with recent three-phase bone scan that was unremarkable.  At this point, it seems her symptoms are coming more from her hip rather than her back.  I have discussed proceeding with [facility] of the left hip for further evaluation but will discuss this with Dr. Roda Shutters prior to ordering.  Will be in touch with the patient following this decision.  Call with concerns or questions in the meantime.  Follow-Up Instructions: Return for after MRI.   Orders:  Orders Placed This Encounter  Procedures   XR HIP UNILAT W OR W/O PELVIS 2-3 VIEWS LEFT   No orders of the defined types were placed in this encounter.     Procedures: No procedures performed   Clinical Data: No additional findings.   Subjective: Chief Complaint  Patient presents with   Left Hip - Pain    HPI patient is a pleasant 62 year old female who comes in today with left hip pain.  She is status post left total hip replacement by Dr. Linna Caprice on 11/30/2020.  She notes that she has had increased pain since.  The pain she is having is primarily to the groin with occasional pain radiating to the lateral hip.  She notes a sharp shooting pain every time she walks which occasionally causes her to fall.  She does have a history of lumbar pathology.  Currently denies any numbness tingling burning or weakness to the left lower extremity.  She underwent three-phase bone scan on 10/07/2022 which was unremarkable.  She denies any fevers or chills or any other constitutional symptoms.  Review of Systems as detailed in HPI.  All others  reviewed and are negative.   Objective: Vital Signs: There were no vitals taken for this visit.  Physical Exam well-developed well-nourished female in no acute distress.  Alert and oriented x 3.  Ortho Exam left hip exam reveals moderate pain to the groin with logroll and FADIR.  No tenderness to the greater trochanter.  No pain with straight leg raise.  No focal weakness.  She is neurovascularly intact distally.  Specialty Comments:  No specialty comments available.  Imaging: XR HIP UNILAT W OR W/O PELVIS 2-3 VIEWS LEFT  Result Date: 10/31/2022 Well-seated prosthesis without complication    PMFS History: Patient Active Problem List   Diagnosis Date Noted   Osteoarthritis of left hip 11/30/2020   Sciatica of right side 10/13/2019   Back pain 11/24/2015   Radicular pain 08/02/2015   Peripheral sensory neuropathy 05/10/2015   Obesity 07/05/2014   Vitamin D deficiency 07/05/2014   Thoracic aorta atherosclerosis (HCC) 07/05/2014   Bladder cancer (HCC)    Hypothyroidism    Restless leg syndrome    Fibromyalgia    Labile hypertension    Borderline hyperlipidemia    COPD (chronic obstructive pulmonary disease) (HCC)    GERD (gastroesophageal reflux disease)    Prediabetes    Bipolar 2 disorder, major  depressive episode [facility])    Past Medical History:  Diagnosis Date   ADD (attention deficit disorder)    Anxiety    Arthritis    Bladder neoplasm    Cancer (HCC) 2015   Bladder cancer   COPD (chronic obstructive pulmonary disease) (HCC)    Depression    Fibroids    Fibromyalgia    H/O bronchitis    treated within the last week w steroids, antibx tessalon pearls.    H/O sebaceous cyst 07/18/2014   left breast    Hemorrhoid    History of melanoma excision    leg   Hyperlipidemia    Hypertension    normal now, only took medications for 1 month- thinks it was from pain.   Hypothyroidism    Iron deficiency anemia    PONV (postoperative nausea and vomiting)    used a  scop patch last surg-did well   Pre-diabetes    Prediabetes    Restless leg syndrome    Sciatica of right side 10/13/2019   Shortness of breath dyspnea    with exertion   Shoulder impingement    right   SUI (stress urinary incontinence, female)    Wears glasses     Family History  Problem Relation Age of Onset   Cancer Father        Lung and stomach    Heart disease Father    Hypertension Father    Stroke Father    Heart failure Paternal Grandfather    Cancer Paternal Grandfather        colon cancer   Ovarian cancer Mother 58   Breast cancer Mother        dx in her late 59s-50s   Thyroid cancer Mother    Ovarian cancer Sister 63   Cancer Brother        abdominal tumor   Ovarian cancer Maternal Aunt    Breast cancer Maternal Aunt    Breast cancer Maternal Uncle    Breast cancer Paternal Aunt    Ovarian cancer Sister 61   Ovarian cancer Sister 23   Cancer Brother        abdominal tumor   Breast cancer Paternal Aunt    Breast cancer Paternal Aunt    Breast cancer Paternal Aunt    Breast cancer Cousin    Breast cancer Cousin     Past Surgical History:  Procedure Laterality Date   ABDOMINAL HYSTERECTOMY  2017   ANKLE FUSION Right 04/08/2009   ANTERIOR CERVICAL DECOMP/DISCECTOMY FUSION N/A 09/01/2013   Procedure: Anterior cervical decompression fusion, cervical 5-6 with instrumentation and allograft;  Surgeon: Emilee Hero, MD;  Location: [facility];  Service: Orthopedics;  Laterality: N/A;  Anterior cervical decompression fusion, cervical 5-6 with instrumentation and allograft   ANTERIOR CERVICAL DECOMP/DISCECTOMY FUSION N/A 08/02/2015   Procedure: ANTERIOR CERVICAL DECOMPRESSION FUSION CERVICAL 6 - THORACIC 1 WITH INSTRUMENTATION AND ALLOGRAFT;  Surgeon: Estill Bamberg, MD;  Location: [facility];  Service: Orthopedics;  Laterality: N/A;  ANTERIOR CERVICAL DECOMPRESSION FUSION, CERVICAL 6-7,CERVICAL 7-THORACIC 7 WITH INSTRUMENTATION AND ALLOGRAFT   CATARACT EXTRACTION W/  INTRAOCULAR LENS IMPLANT Bilateral    CLOSED REDUCTION AND PINNING LEFT FIFTH  PROXIMAL PHALANGEAL FX  10/09/2005   COLONOSCOPY  2021   CYSTOSCOPY W/ RETROGRADES Left 01/11/2014   Procedure: CYSTOSCOPY WITH RETROGRADE PYELOGRAM;  Surgeon: Jerilee Field, MD;  Location: [facility];  Service: Urology;  Laterality: Left;   CYSTOSCOPY WITH STENT PLACEMENT Left 01/11/2014   Procedure:  CYSTOSCOPY WITH STENT PLACEMENT;  Surgeon: Jerilee Field, MD;  Location: [facility];  Service: Urology;  Laterality: Left;   D & C HYSTEROSCOPY WITH NOVASURE ENDOMETRIAL ABLATION  07/16/2006   HIP ARTHROPLASTY Right 2019   KNEE ARTHROSCOPY WITH MEDIAL MENISECTOMY Right 05/22/2012   Procedure: KNEE ARTHROSCOPY WITH MEDIAL MENISECTOMY and Medial Plica excision;  Surgeon: Harvie Junior, MD;  Location: [facility];  Service: Orthopedics;  Laterality: Right;   LAPAROSCOPIC ASSISTED VAGINAL HYSTERECTOMY N/A 08/29/2014   Procedure: LAPAROSCOPIC ASSISTED VAGINAL HYSTERECTOMY;  Surgeon: Richarda Overlie, MD;  Location: [facility];  Service: Gynecology;  Laterality: N/A;   LAPAROSCOPIC CHOLECYSTECTOMY  04/09/2003   LAPAROTOMY N/A 08/29/2014   Procedure: EXPLORATORY LAPAROTOMY WITH LIGATION OF BLEEDER;  Surgeon: Richarda Overlie, MD;  Location: [facility];  Service: Gynecology;  Laterality: N/A;   POSTERIOR CERVICAL FUSION/FORAMINOTOMY N/A 01/25/2015   Procedure: POSTERIOR CERVICAL DECOMPRESSSION CERVICAL 7-THORACIC 1;  Surgeon: Estill Bamberg, MD;  Location: [facility];  Service: Orthopedics;  Laterality: N/A;  Posterior cervical decompression, cervical 5-6, cervical 6-7, cervical 7-thoracic 1    PUBOVAGINAL SLING N/A 08/29/2014   Procedure: SOLYX SINGLE INCISION SLING;  Surgeon: Richarda Overlie, MD;  Location: [facility];  Service: Gynecology;  Laterality: N/A;   SACROILIAC JOINT FUSION Left 12/20/2021   Procedure: LEFT-SIDED SACROILIAC JOINT FUSION;  Surgeon:  Estill Bamberg, MD;  Location: [facility];  Service: Orthopedics;  Laterality: Left;   SALPINGOOPHORECTOMY Bilateral 08/29/2014   Procedure: SALPINGO OOPHORECTOMY;  Surgeon: Richarda Overlie, MD;  Location: [facility];  Service: Gynecology;  Laterality: Bilateral;   SHOULDER ARTHROSCOPY W/ SUBACROMIAL DECOMPRESSION AND DISTAL CLAVICLE EXCISION Left 11/27/2011   W/  DEBRIDEMENT   SHOULDER ARTHROSCOPY WITH BICEPSTENOTOMY Right 12/21/2014   Procedure: SHOULDER ARTHROSCOPY WITH BICEPSTENOTOMY;  Surgeon: Jodi Geralds, MD;  Location: [facility];  Service: Orthopedics;  Laterality: Right;   SHOULDER ARTHROSCOPY WITH DISTAL CLAVICLE RESECTION Right 12/21/2014   Procedure: SHOULDER ARTHROSCOPY WITH DISTAL CLAVICLE RESECTION;  Surgeon: Jodi Geralds, MD;  Location: [facility];  Service: Orthopedics;  Laterality: Right;   SHOULDER ARTHROSCOPY WITH OPEN ROTATOR CUFF REPAIR Right 07/12/2015   Procedure: SHOULDER ARTHROSCOPY WITH ACROMIOPLASTY AND OPEN ROTATOR CUFF REPAIR;  Surgeon: Jodi Geralds, MD;  Location: [facility];  Service: Orthopedics;  Laterality: Right;   SHOULDER ARTHROSCOPY WITH SUBACROMIAL DECOMPRESSION Left 11/10/2013   Procedure: SHOULDER ARTHROSCOPY WITH SUBACROMIAL DECOMPRESSION WITH ROTATOR CUFF REPAIR AND BICEPS TENODESIS;  Surgeon: Harvie Junior, MD;  Location: [facility];  Service: Orthopedics;  Laterality: Left;   SHOULDER ARTHROSCOPY WITH SUBACROMIAL DECOMPRESSION Right 12/21/2014   Procedure: SHOULDER ARTHROSCOPY WITH SUBACROMIAL DECOMPRESSION;  Surgeon: Jodi Geralds, MD;  Location: [facility];  Service: Orthopedics;  Laterality: Right;   TONSILLECTOMY  age 6   TOTAL HIP ARTHROPLASTY Left 11/30/2020   Procedure: TOTAL HIP ARTHROPLASTY ANTERIOR APPROACH;  Surgeon: Samson Frederic, MD;  Location: [facility];  Service: Orthopedics;  Laterality: Left;    TRANSURETHRAL RESECTION OF BLADDER TUMOR N/A  01/11/2014   Procedure: TRANSURETHRAL RESECTION OF BLADDER TUMOR (TURBT);  Surgeon: Jerilee Field, MD;  Location: [facility];  Service: Urology;  Laterality: N/A;   TUBAL LIGATION  04/08/1978   Social History   Occupational History   Occupation: Oncologist: [employer]   Tobacco Use   Smoking status: Former    Current packs/day: 0.00    Average packs/day: 1 pack/day for 30.0  years (30.0 ttl pk-yrs)    Types: Cigarettes    Start date: 71    Quit date: 2015    Years since quitting: 9.5   Smokeless tobacco: Never  Vaping Use   Vaping status: Never Used  Substance and Sexual Activity   Alcohol use: No   Drug use: No   Sexual activity: Not on file

## 2022-11-05 DIAGNOSIS — M76899 Other specified enthesopathies of unspecified lower limb, excluding foot: Secondary | ICD-10-CM | POA: Diagnosis not present

## 2022-11-05 DIAGNOSIS — M5459 Other low back pain: Secondary | ICD-10-CM | POA: Diagnosis not present

## 2022-11-05 DIAGNOSIS — Z96642 Presence of left artificial hip joint: Secondary | ICD-10-CM | POA: Diagnosis not present

## 2022-11-05 DIAGNOSIS — M76892 Other specified enthesopathies of left lower limb, excluding foot: Secondary | ICD-10-CM | POA: Diagnosis not present

## 2022-11-11 DIAGNOSIS — M5416 Radiculopathy, lumbar region: Secondary | ICD-10-CM | POA: Diagnosis not present

## 2022-11-11 DIAGNOSIS — M4696 Unspecified inflammatory spondylopathy, lumbar region: Secondary | ICD-10-CM | POA: Diagnosis not present

## 2022-11-11 DIAGNOSIS — M25552 Pain in left hip: Secondary | ICD-10-CM | POA: Diagnosis not present

## 2022-11-12 ENCOUNTER — Ambulatory Visit: Payer: PPO | Admitting: Orthopaedic Surgery

## 2022-11-18 ENCOUNTER — Other Ambulatory Visit: Payer: Self-pay | Admitting: Orthopedic Surgery

## 2022-11-18 DIAGNOSIS — M545 Low back pain, unspecified: Secondary | ICD-10-CM

## 2022-11-21 ENCOUNTER — Ambulatory Visit
Admission: RE | Admit: 2022-11-21 | Discharge: 2022-11-21 | Disposition: A | Discharge status: Home / Self Care | Payer: PPO | Source: Ambulatory Visit | Attending: Orthopedic Surgery | Admitting: Orthopedic Surgery

## 2022-11-21 DIAGNOSIS — M4807 Spinal stenosis, lumbosacral region: Secondary | ICD-10-CM | POA: Diagnosis not present

## 2022-11-21 DIAGNOSIS — M5137 Other intervertebral disc degeneration, lumbosacral region: Secondary | ICD-10-CM | POA: Diagnosis not present

## 2022-11-21 DIAGNOSIS — M545 Low back pain, unspecified: Secondary | ICD-10-CM

## 2022-11-21 DIAGNOSIS — M5136 Other intervertebral disc degeneration, lumbar region: Secondary | ICD-10-CM | POA: Diagnosis not present

## 2022-11-21 DIAGNOSIS — M5126 Other intervertebral disc displacement, lumbar region: Secondary | ICD-10-CM | POA: Diagnosis not present

## 2022-11-21 DIAGNOSIS — M47816 Spondylosis without myelopathy or radiculopathy, lumbar region: Secondary | ICD-10-CM | POA: Diagnosis not present

## 2022-11-21 DIAGNOSIS — M47817 Spondylosis without myelopathy or radiculopathy, lumbosacral region: Secondary | ICD-10-CM | POA: Diagnosis not present

## 2022-12-02 DIAGNOSIS — M7062 Trochanteric bursitis, left hip: Secondary | ICD-10-CM | POA: Diagnosis not present

## 2022-12-02 DIAGNOSIS — M545 Low back pain, unspecified: Secondary | ICD-10-CM | POA: Diagnosis not present

## 2022-12-02 DIAGNOSIS — M4696 Unspecified inflammatory spondylopathy, lumbar region: Secondary | ICD-10-CM | POA: Diagnosis not present

## 2023-01-27 DIAGNOSIS — M4696 Unspecified inflammatory spondylopathy, lumbar region: Secondary | ICD-10-CM | POA: Diagnosis not present

## 2023-02-21 DIAGNOSIS — M47816 Spondylosis without myelopathy or radiculopathy, lumbar region: Secondary | ICD-10-CM | POA: Diagnosis not present

## 2023-02-27 DIAGNOSIS — M51372 Other intervertebral disc degeneration, lumbosacral region with discogenic back pain and lower extremity pain: Secondary | ICD-10-CM | POA: Diagnosis not present

## 2023-02-27 DIAGNOSIS — Z6841 Body Mass Index (BMI) 40.0 and over, adult: Secondary | ICD-10-CM | POA: Diagnosis not present

## 2023-02-27 DIAGNOSIS — Z1330 Encounter for screening examination for mental health and behavioral disorders, unspecified: Secondary | ICD-10-CM | POA: Diagnosis not present

## 2023-03-19 DIAGNOSIS — R062 Wheezing: Secondary | ICD-10-CM | POA: Diagnosis not present

## 2023-03-19 DIAGNOSIS — J020 Streptococcal pharyngitis: Secondary | ICD-10-CM | POA: Diagnosis not present

## 2023-03-26 DIAGNOSIS — F411 Generalized anxiety disorder: Secondary | ICD-10-CM | POA: Diagnosis not present

## 2023-03-26 DIAGNOSIS — F321 Major depressive disorder, single episode, moderate: Secondary | ICD-10-CM | POA: Diagnosis not present

## 2023-03-26 DIAGNOSIS — J410 Simple chronic bronchitis: Secondary | ICD-10-CM | POA: Diagnosis not present

## 2023-03-26 DIAGNOSIS — R7303 Prediabetes: Secondary | ICD-10-CM | POA: Diagnosis not present

## 2023-03-26 DIAGNOSIS — E7800 Pure hypercholesterolemia, unspecified: Secondary | ICD-10-CM | POA: Diagnosis not present

## 2023-03-26 DIAGNOSIS — G2581 Restless legs syndrome: Secondary | ICD-10-CM | POA: Diagnosis not present

## 2023-03-26 DIAGNOSIS — F418 Other specified anxiety disorders: Secondary | ICD-10-CM | POA: Diagnosis not present

## 2023-03-26 DIAGNOSIS — M797 Fibromyalgia: Secondary | ICD-10-CM | POA: Diagnosis not present

## 2023-03-26 DIAGNOSIS — F988 Other specified behavioral and emotional disorders with onset usually occurring in childhood and adolescence: Secondary | ICD-10-CM | POA: Diagnosis not present

## 2023-03-26 DIAGNOSIS — E559 Vitamin D deficiency, unspecified: Secondary | ICD-10-CM | POA: Diagnosis not present

## 2023-03-26 DIAGNOSIS — Z8639 Personal history of other endocrine, nutritional and metabolic disease: Secondary | ICD-10-CM | POA: Diagnosis not present

## 2023-06-13 DIAGNOSIS — B9689 Other specified bacterial agents as the cause of diseases classified elsewhere: Secondary | ICD-10-CM | POA: Diagnosis not present

## 2023-06-13 DIAGNOSIS — E7800 Pure hypercholesterolemia, unspecified: Secondary | ICD-10-CM | POA: Diagnosis not present

## 2023-06-13 DIAGNOSIS — E118 Type 2 diabetes mellitus with unspecified complications: Secondary | ICD-10-CM | POA: Diagnosis not present

## 2023-06-13 DIAGNOSIS — I10 Essential (primary) hypertension: Secondary | ICD-10-CM | POA: Diagnosis not present

## 2023-06-13 DIAGNOSIS — J22 Unspecified acute lower respiratory infection: Secondary | ICD-10-CM | POA: Diagnosis not present

## 2023-06-14 ENCOUNTER — Emergency Department (HOSPITAL_BASED_OUTPATIENT_CLINIC_OR_DEPARTMENT_OTHER): Admitting: Radiology

## 2023-06-14 ENCOUNTER — Emergency Department (HOSPITAL_BASED_OUTPATIENT_CLINIC_OR_DEPARTMENT_OTHER)
Admission: EM | Admit: 2023-06-14 | Discharge: 2023-06-14 | Disposition: A | Discharge status: Home / Self Care | Attending: Emergency Medicine | Admitting: Emergency Medicine

## 2023-06-14 ENCOUNTER — Other Ambulatory Visit: Payer: Self-pay

## 2023-06-14 ENCOUNTER — Encounter (HOSPITAL_BASED_OUTPATIENT_CLINIC_OR_DEPARTMENT_OTHER): Payer: Self-pay | Admitting: Emergency Medicine

## 2023-06-14 DIAGNOSIS — Z720 Tobacco use: Secondary | ICD-10-CM | POA: Diagnosis not present

## 2023-06-14 DIAGNOSIS — J449 Chronic obstructive pulmonary disease, unspecified: Secondary | ICD-10-CM | POA: Insufficient documentation

## 2023-06-14 DIAGNOSIS — I700 Atherosclerosis of aorta: Secondary | ICD-10-CM | POA: Diagnosis not present

## 2023-06-14 DIAGNOSIS — I10 Essential (primary) hypertension: Secondary | ICD-10-CM | POA: Insufficient documentation

## 2023-06-14 DIAGNOSIS — R079 Chest pain, unspecified: Secondary | ICD-10-CM | POA: Insufficient documentation

## 2023-06-14 DIAGNOSIS — R0789 Other chest pain: Secondary | ICD-10-CM | POA: Diagnosis not present

## 2023-06-14 LAB — CBC
HCT: 41.9 % (ref 36.0–46.0)
Hemoglobin: 14 g/dL (ref 12.0–15.0)
MCH: 29.8 pg (ref 26.0–34.0)
MCHC: 33.4 g/dL (ref 30.0–36.0)
MCV: 89.1 fL (ref 80.0–100.0)
Platelets: 285 10*3/uL (ref 150–400)
RBC: 4.7 MIL/uL (ref 3.87–5.11)
RDW: 12.4 % (ref 11.5–15.5)
WBC: 5.3 10*3/uL (ref 4.0–10.5)
nRBC: 0 % (ref 0.0–0.2)

## 2023-06-14 LAB — BASIC METABOLIC PANEL
Anion gap: 9 (ref 5–15)
BUN: 19 mg/dL (ref 8–23)
CO2: 25 mmol/L (ref 22–32)
Calcium: 9.1 mg/dL (ref 8.9–10.3)
Chloride: 103 mmol/L (ref 98–111)
Creatinine, Ser: 0.68 mg/dL (ref 0.44–1.00)
GFR, Estimated: >60 mL/min (ref >60)
Glucose, Bld: 99 mg/dL (ref 70–99)
Potassium: 4.2 mmol/L (ref 3.5–5.1)
Sodium: 137 mmol/L (ref 135–145)

## 2023-06-14 LAB — TROPONIN I (HIGH SENSITIVITY): Troponin I (High Sensitivity): 5 ng/L (ref <18)

## 2023-06-14 NOTE — ED Notes (Signed)
 Patient transported to X-ray

## 2023-06-14 NOTE — ED Notes (Signed)
 Dc by Joni Fears

## 2023-06-14 NOTE — Discharge Instructions (Signed)
 Fortunately your potassium level is normal.  Your blood work today did not show any concerning finding.  No evidence of heart injury.  Please follow-up closely with your doctor for further care.

## 2023-06-14 NOTE — ED Provider Notes (Signed)
 [facility] Provider Note   CSN: [id number] Arrival date & time: 06/14/23  1049     History  Chief Complaint  Patient presents with   Chest Pain    [name] is a 63 y.o. female.  The history is provided by the patient and medical records. No language interpreter was used.  Chest Pain    63 year old female history of fibromyalgia, COPD, hemorrhoids, COPD, anxiety, prediabetes, hyperlipidemia, hypertension who was sent here from her PCP for concerns of elevated potassium level.  Patient mention she went to see her PCP yesterday for a regular checkup and blood work.  She was notified today that her potassium level is extremely high and she should come to the ER for further evaluation.  She did mention recently having a viral infection likely the flu because her husband was diagnosed with flu.  She endorses flulike symptoms for the past week that is slowly improving.  She endorsed occasional tingling sensation in her chest but attributed to her flu.  No nausea vomiting diarrhea no severe chest pain or shortness of breath.  Home Medications Prior to Admission medications   Medication Sig Start Date End Date Taking? Authorizing Provider  albuterol (VENTOLIN HFA) 108 (90 Base) MCG/ACT inhaler Inhale 2 puffs into the lungs See admin instructions. Inhale 2 puffs (scheduled) in the morning & every 6 hours as needed for wheezing/shortness of breath. 01/13/17   [provider]  amphetamine-dextroamphetamine (ADDERALL) 20 MG tablet Take 1 tablet (20 mg total) daily by mouth. 02/14/17   Doree Albee, PA-C  calcium carbonate (OSCAL) 1500 (600 Ca) MG TABS tablet Take 600 mg of elemental calcium by mouth every evening.    [provider]  Cholecalciferol (VITAMIN D-3) 125 MCG (5000 UT) TABS Take 5,000 Units by mouth in the morning.    [provider]  clonazePAM (KLONOPIN) 2 MG tablet Take 1 tablet (2 mg total) by mouth at  bedtime. for anxiety 01/13/17   Judd Gaudier, NP  cyanocobalamin (VITAMIN B12) 1000 MCG tablet Take 1,000 mcg by mouth daily.    [provider]  docusate sodium (COLACE) 100 MG capsule Take 1 capsule (100 mg total) by mouth 2 (two) times daily. Patient taking differently: Take 100 mg by mouth at bedtime. 12/01/20   Darrick Grinder, PA-C  escitalopram (LEXAPRO) 20 MG tablet Take 20 mg by mouth every evening. 08/29/20   [provider]  gabapentin (NEURONTIN) 100 MG capsule Take 1 capsule (100 mg total) by mouth 3 (three) times daily as needed. Fibromyalgia pain Patient taking differently: Take 100 mg by mouth 2 (two) times daily. Fibromyalgia pain 01/13/17   Lucky Cowboy, MD  lidocaine (LIDODERM) 5 % Place 1 patch onto the skin daily. Remove & Discard patch within 12 hours or as directed by MD Patient not taking: Reported on 12/12/2021 06/09/21   Henderly, Britni A, PA-C  magnesium oxide (MAG-OX) 400 MG tablet Take 400 mg by mouth daily.    [provider]  methocarbamol (ROBAXIN) 500 MG tablet Take 1 tablet (500 mg total) by mouth 2 (two) times daily. Patient not taking: Reported on 12/12/2021 06/09/21   Henderly, Britni A, PA-C  methocarbamol (ROBAXIN) 750 MG tablet Take 1 tablet (750 mg total) by mouth every 6 (six) hours as needed for muscle spasms. 12/20/21   McKenzie, Eilene Ghazi, PA-C  Polyethyl Glycol-Propyl Glycol (LUBRICANT EYE DROPS) 0.4-0.3 % SOLN Place 1-2 drops into both eyes 3 (three) times daily as  needed (dry/irritated eyes).    [provider]  tiotropium (SPIRIVA) 18 MCG inhalation capsule Place 18 mcg into inhaler and inhale daily.    [provider]  traMADol (ULTRAM) 50 MG tablet Take 50 mg by mouth daily. 07/05/21   [provider]      Allergies    Sulfa antibiotics    Review of Systems   Review of Systems  Cardiovascular:  Positive for chest pain.  All other systems reviewed and are negative.   Physical Exam Updated  Vital Signs BP (!) 144/62 (BP Location: Right Arm)   Pulse 70   Temp 97.6 F (36.4 C) (Tympanic)   Resp 16   Ht 5\' 3"  (1.6 m)   Wt 98 kg   SpO2 97%   BMI 38.26 kg/m  Physical Exam Vitals and nursing note reviewed.  Constitutional:      General: She is not in acute distress.    Appearance: She is well-developed.  HENT:     Head: Atraumatic.  Eyes:     Conjunctiva/sclera: Conjunctivae normal.  Cardiovascular:     Rate and Rhythm: Normal rate and regular rhythm.     Pulses: Normal pulses.     Heart sounds: Normal heart sounds.  Pulmonary:     Effort: Pulmonary effort is normal.  Abdominal:     Palpations: Abdomen is soft.     Tenderness: There is no abdominal tenderness.  Musculoskeletal:     Cervical back: Neck supple.  Skin:    Findings: No rash.  Neurological:     Mental Status: She is alert and oriented to person, place, and time.  Psychiatric:        Mood and Affect: Mood normal.     ED Results / Procedures / Treatments   Labs (all labs ordered are listed, but only abnormal results are displayed) Labs Reviewed  BASIC METABOLIC PANEL  CBC  TROPONIN I (HIGH SENSITIVITY)  TROPONIN I (HIGH SENSITIVITY)    EKG EKG Interpretation Date/Time:  Saturday June 14 2023 11:02:04 EST Ventricular Rate:  72 PR Interval:  144 QRS Duration:  76 QT Interval:  384 QTC Calculation: 420 R Axis:   92  Text Interpretation: Normal sinus rhythm Rightward axis Low voltage QRS Borderline ECG When compared with ECG of 14-Dec-2021 13:45, No significant change was found Confirmed by Margarita Grizzle [id number]) on 06/14/2023 11:22:33 AM  Radiology DG Chest 2 View Result Date: 06/14/2023 CLINICAL DATA:  Chest pain EXAM: CHEST - 2 VIEW COMPARISON:  11/08/2020 x-ray and older FINDINGS: Azygous fissure. No consolidation, pneumothorax or effusion. No edema. Normal cardiopericardial silhouette. Calcified aorta. Degenerative changes seen along the spine. Fixation hardware along the lower cervical  spine at the edge of the imaging field. IMPRESSION: No acute cardiopulmonary disease. Electronically Signed   By: Karen Kays M.D.   On: 06/14/2023 11:34    Procedures Procedures    Medications Ordered in ED Medications - No data to display  ED Course/ Medical Decision Making/ A&P                                 Medical Decision Making Amount and/or Complexity of Data Reviewed Labs: ordered. Radiology: ordered.   BP (!) 144/62 (BP Location: Right Arm)   Pulse 70   Temp 97.6 F (36.4 C) (Tympanic)   Resp 16   Ht 5\' 3"  (1.6 m)   Wt 98 kg   SpO2 97%  BMI 38.26 kg/m   23:29 AM  63 year old female history of fibromyalgia, COPD, hemorrhoids, COPD, anxiety, prediabetes, hyperlipidemia, hypertension who was sent here from her PCP for concerns of elevated potassium level.  Patient mention she went to see her PCP yesterday for a regular checkup and blood work.  She was notified today that her potassium level is extremely high and she should come to the ER for further evaluation.  She did mention recently having a viral infection likely the flu because her husband was diagnosed with flu.  She endorses flulike symptoms for the past week that is slowly improving.  She endorsed occasional tingling sensation in her chest but attributed to her flu.  No nausea vomiting diarrhea no severe chest pain or shortness of breath.  Exam overall reassuring, patient is resting comfortably appears to be in no acute discomfort.  Heart with normal rate rhythm, lungs are clear to auscultation bilaterally abdomen is soft nontender  -Labs ordered, independently viewed and interpreted by me.  Labs remarkable for normal potassium level.  Normal trop -The patient was maintained on a cardiac monitor.  I personally viewed and interpreted the cardiac monitored which showed an underlying rhythm of: NSR -Imaging independently viewed and interpreted by me and I agree with radiologist's interpretation.  Result  remarkable for CXR unremarkable -This patient presents to the ED for concern of cp, this involves an extensive number of treatment options, and is a complaint that carries with it a high risk of complications and morbidity.  The differential diagnosis includes anxiety, msk, cad, gastritis -Co morbidities that complicate the patient evaluation includes fibromyalgia, copd, anxiety -Treatment includes reassurance -Reevaluation of the patient after these medicines showed that the patient resolved -PCP office notes or outside notes reviewed -Escalation to admission/observation considered: patients feels much better, is comfortable with discharge, and will follow up with PCP -Prescription medication considered, patient comfortable with home medication -Social Determinant of Health considered which includes tobacco use.          Final Clinical Impression(s) / ED Diagnoses Final diagnoses:  Chest pain, unspecified type    Rx / DC Orders ED Discharge Orders     None         Fayrene Helper, PA-C 06/14/23 1316    Margarita Grizzle, MD 06/14/23 [phone]

## 2023-06-14 NOTE — ED Triage Notes (Signed)
 Pt arrived POV, caox4, ambulatory stating her PCP called her this morning recommended her to come to ED because routine labs drawn yest resulted with high K+ (unknown value). Pt states PCP asked her if she had CP which she initially said no but reports she has had intermittent stabbing pains in the center of the chest with dizziness and tingling in L arm for approx 70mo. Denies CP, SOB, N/V, dizziness at present.

## 2023-06-17 ENCOUNTER — Other Ambulatory Visit (HOSPITAL_BASED_OUTPATIENT_CLINIC_OR_DEPARTMENT_OTHER): Payer: Self-pay | Admitting: Family Medicine

## 2023-06-17 DIAGNOSIS — E782 Mixed hyperlipidemia: Secondary | ICD-10-CM

## 2023-06-17 DIAGNOSIS — E118 Type 2 diabetes mellitus with unspecified complications: Secondary | ICD-10-CM

## 2023-06-17 DIAGNOSIS — I10 Essential (primary) hypertension: Secondary | ICD-10-CM

## 2023-06-18 DIAGNOSIS — H35371 Puckering of macula, right eye: Secondary | ICD-10-CM | POA: Diagnosis not present

## 2023-06-18 DIAGNOSIS — H33321 Round hole, right eye: Secondary | ICD-10-CM | POA: Diagnosis not present

## 2023-06-18 DIAGNOSIS — H43813 Vitreous degeneration, bilateral: Secondary | ICD-10-CM | POA: Diagnosis not present

## 2023-06-25 ENCOUNTER — Ambulatory Visit (HOSPITAL_COMMUNITY)
Admission: RE | Admit: 2023-06-25 | Discharge: 2023-06-25 | Disposition: A | Discharge status: Home / Self Care | Payer: Self-pay | Source: Ambulatory Visit | Attending: Family Medicine | Admitting: Family Medicine

## 2023-06-25 DIAGNOSIS — E118 Type 2 diabetes mellitus with unspecified complications: Secondary | ICD-10-CM | POA: Insufficient documentation

## 2023-06-25 DIAGNOSIS — I10 Essential (primary) hypertension: Secondary | ICD-10-CM | POA: Insufficient documentation

## 2023-06-25 DIAGNOSIS — E782 Mixed hyperlipidemia: Secondary | ICD-10-CM | POA: Insufficient documentation

## 2023-06-26 ENCOUNTER — Other Ambulatory Visit (HOSPITAL_COMMUNITY)

## 2023-06-26 DIAGNOSIS — R945 Abnormal results of liver function studies: Secondary | ICD-10-CM | POA: Diagnosis not present

## 2023-07-03 DIAGNOSIS — H33321 Round hole, right eye: Secondary | ICD-10-CM | POA: Diagnosis not present

## 2023-07-04 DIAGNOSIS — F321 Major depressive disorder, single episode, moderate: Secondary | ICD-10-CM | POA: Diagnosis not present

## 2023-07-04 DIAGNOSIS — R945 Abnormal results of liver function studies: Secondary | ICD-10-CM | POA: Diagnosis not present

## 2023-07-04 DIAGNOSIS — E118 Type 2 diabetes mellitus with unspecified complications: Secondary | ICD-10-CM | POA: Diagnosis not present

## 2023-07-04 DIAGNOSIS — F419 Anxiety disorder, unspecified: Secondary | ICD-10-CM | POA: Diagnosis not present

## 2023-07-04 DIAGNOSIS — I10 Essential (primary) hypertension: Secondary | ICD-10-CM | POA: Diagnosis not present

## 2023-08-08 DIAGNOSIS — H31091 Other chorioretinal scars, right eye: Secondary | ICD-10-CM | POA: Diagnosis not present

## 2023-08-08 DIAGNOSIS — H43813 Vitreous degeneration, bilateral: Secondary | ICD-10-CM | POA: Diagnosis not present

## 2023-08-08 DIAGNOSIS — H35371 Puckering of macula, right eye: Secondary | ICD-10-CM | POA: Diagnosis not present

## 2023-08-15 DIAGNOSIS — H02834 Dermatochalasis of left upper eyelid: Secondary | ICD-10-CM | POA: Diagnosis not present

## 2023-08-15 DIAGNOSIS — Z9889 Other specified postprocedural states: Secondary | ICD-10-CM | POA: Diagnosis not present

## 2023-08-15 DIAGNOSIS — H31001 Unspecified chorioretinal scars, right eye: Secondary | ICD-10-CM | POA: Diagnosis not present

## 2023-08-15 DIAGNOSIS — H02831 Dermatochalasis of right upper eyelid: Secondary | ICD-10-CM | POA: Diagnosis not present

## 2023-08-15 DIAGNOSIS — H57813 Brow ptosis, bilateral: Secondary | ICD-10-CM | POA: Diagnosis not present

## 2023-09-02 DIAGNOSIS — Z96642 Presence of left artificial hip joint: Secondary | ICD-10-CM | POA: Diagnosis not present

## 2023-09-10 DIAGNOSIS — H02831 Dermatochalasis of right upper eyelid: Secondary | ICD-10-CM | POA: Diagnosis not present

## 2023-09-10 DIAGNOSIS — H02834 Dermatochalasis of left upper eyelid: Secondary | ICD-10-CM | POA: Diagnosis not present

## 2023-09-10 DIAGNOSIS — H33321 Round hole, right eye: Secondary | ICD-10-CM | POA: Diagnosis not present

## 2023-09-10 DIAGNOSIS — E119 Type 2 diabetes mellitus without complications: Secondary | ICD-10-CM | POA: Diagnosis not present

## 2023-09-10 DIAGNOSIS — H524 Presbyopia: Secondary | ICD-10-CM | POA: Diagnosis not present

## 2023-09-10 DIAGNOSIS — Z961 Presence of intraocular lens: Secondary | ICD-10-CM | POA: Diagnosis not present

## 2023-09-10 DIAGNOSIS — Z7985 Long-term (current) use of injectable non-insulin antidiabetic drugs: Secondary | ICD-10-CM | POA: Diagnosis not present

## 2023-09-19 DIAGNOSIS — J410 Simple chronic bronchitis: Secondary | ICD-10-CM | POA: Diagnosis not present

## 2023-09-19 DIAGNOSIS — E118 Type 2 diabetes mellitus with unspecified complications: Secondary | ICD-10-CM | POA: Diagnosis not present

## 2023-09-19 DIAGNOSIS — I10 Essential (primary) hypertension: Secondary | ICD-10-CM | POA: Diagnosis not present

## 2023-10-06 DIAGNOSIS — J410 Simple chronic bronchitis: Secondary | ICD-10-CM | POA: Diagnosis not present

## 2023-10-06 DIAGNOSIS — I10 Essential (primary) hypertension: Secondary | ICD-10-CM | POA: Diagnosis not present

## 2023-10-06 DIAGNOSIS — F321 Major depressive disorder, single episode, moderate: Secondary | ICD-10-CM | POA: Diagnosis not present

## 2023-10-06 DIAGNOSIS — F418 Other specified anxiety disorders: Secondary | ICD-10-CM | POA: Diagnosis not present

## 2023-10-06 DIAGNOSIS — Z1231 Encounter for screening mammogram for malignant neoplasm of breast: Secondary | ICD-10-CM | POA: Diagnosis not present

## 2023-10-06 DIAGNOSIS — E118 Type 2 diabetes mellitus with unspecified complications: Secondary | ICD-10-CM | POA: Diagnosis not present

## 2023-10-06 DIAGNOSIS — E7800 Pure hypercholesterolemia, unspecified: Secondary | ICD-10-CM | POA: Diagnosis not present

## 2023-10-07 DIAGNOSIS — M25552 Pain in left hip: Secondary | ICD-10-CM | POA: Diagnosis not present

## 2023-10-07 DIAGNOSIS — M7612 Psoas tendinitis, left hip: Secondary | ICD-10-CM | POA: Diagnosis not present

## 2023-10-07 DIAGNOSIS — Z96642 Presence of left artificial hip joint: Secondary | ICD-10-CM | POA: Diagnosis not present

## 2023-10-18 DIAGNOSIS — I10 Essential (primary) hypertension: Secondary | ICD-10-CM | POA: Diagnosis not present

## 2023-10-18 DIAGNOSIS — J410 Simple chronic bronchitis: Secondary | ICD-10-CM | POA: Diagnosis not present

## 2023-10-18 DIAGNOSIS — E118 Type 2 diabetes mellitus with unspecified complications: Secondary | ICD-10-CM | POA: Diagnosis not present

## 2023-10-20 DIAGNOSIS — F319 Bipolar disorder, unspecified: Secondary | ICD-10-CM | POA: Diagnosis not present

## 2023-10-20 DIAGNOSIS — G8929 Other chronic pain: Secondary | ICD-10-CM | POA: Diagnosis not present

## 2023-10-20 DIAGNOSIS — F419 Anxiety disorder, unspecified: Secondary | ICD-10-CM | POA: Diagnosis not present

## 2023-10-20 DIAGNOSIS — I2510 Atherosclerotic heart disease of native coronary artery without angina pectoris: Secondary | ICD-10-CM | POA: Diagnosis not present

## 2023-10-20 DIAGNOSIS — E559 Vitamin D deficiency, unspecified: Secondary | ICD-10-CM | POA: Diagnosis not present

## 2023-10-20 DIAGNOSIS — J449 Chronic obstructive pulmonary disease, unspecified: Secondary | ICD-10-CM | POA: Diagnosis not present

## 2023-10-20 DIAGNOSIS — I10 Essential (primary) hypertension: Secondary | ICD-10-CM | POA: Diagnosis not present

## 2023-10-20 DIAGNOSIS — G2581 Restless legs syndrome: Secondary | ICD-10-CM | POA: Diagnosis not present

## 2023-10-20 DIAGNOSIS — E1142 Type 2 diabetes mellitus with diabetic polyneuropathy: Secondary | ICD-10-CM | POA: Diagnosis not present

## 2023-10-20 DIAGNOSIS — H9192 Unspecified hearing loss, left ear: Secondary | ICD-10-CM | POA: Diagnosis not present

## 2023-10-20 DIAGNOSIS — F909 Attention-deficit hyperactivity disorder, unspecified type: Secondary | ICD-10-CM | POA: Diagnosis not present

## 2023-11-06 DIAGNOSIS — J410 Simple chronic bronchitis: Secondary | ICD-10-CM | POA: Diagnosis not present

## 2023-11-06 DIAGNOSIS — I10 Essential (primary) hypertension: Secondary | ICD-10-CM | POA: Diagnosis not present

## 2023-11-06 DIAGNOSIS — F321 Major depressive disorder, single episode, moderate: Secondary | ICD-10-CM | POA: Diagnosis not present

## 2023-11-06 DIAGNOSIS — E7800 Pure hypercholesterolemia, unspecified: Secondary | ICD-10-CM | POA: Diagnosis not present

## 2023-11-06 DIAGNOSIS — F418 Other specified anxiety disorders: Secondary | ICD-10-CM | POA: Diagnosis not present

## 2023-11-06 DIAGNOSIS — E118 Type 2 diabetes mellitus with unspecified complications: Secondary | ICD-10-CM | POA: Diagnosis not present

## 2023-11-17 DIAGNOSIS — J410 Simple chronic bronchitis: Secondary | ICD-10-CM | POA: Diagnosis not present

## 2023-11-17 DIAGNOSIS — E118 Type 2 diabetes mellitus with unspecified complications: Secondary | ICD-10-CM | POA: Diagnosis not present

## 2023-11-17 DIAGNOSIS — I10 Essential (primary) hypertension: Secondary | ICD-10-CM | POA: Diagnosis not present

## 2023-12-03 DIAGNOSIS — Z0000 Encounter for general adult medical examination without abnormal findings: Secondary | ICD-10-CM | POA: Diagnosis not present

## 2023-12-03 DIAGNOSIS — E7800 Pure hypercholesterolemia, unspecified: Secondary | ICD-10-CM | POA: Diagnosis not present

## 2023-12-03 DIAGNOSIS — F411 Generalized anxiety disorder: Secondary | ICD-10-CM | POA: Diagnosis not present

## 2023-12-03 DIAGNOSIS — Z8639 Personal history of other endocrine, nutritional and metabolic disease: Secondary | ICD-10-CM | POA: Diagnosis not present

## 2023-12-03 DIAGNOSIS — E118 Type 2 diabetes mellitus with unspecified complications: Secondary | ICD-10-CM | POA: Diagnosis not present

## 2023-12-03 DIAGNOSIS — E559 Vitamin D deficiency, unspecified: Secondary | ICD-10-CM | POA: Diagnosis not present

## 2023-12-03 DIAGNOSIS — F321 Major depressive disorder, single episode, moderate: Secondary | ICD-10-CM | POA: Diagnosis not present

## 2023-12-03 DIAGNOSIS — Z23 Encounter for immunization: Secondary | ICD-10-CM | POA: Diagnosis not present

## 2023-12-03 DIAGNOSIS — M797 Fibromyalgia: Secondary | ICD-10-CM | POA: Diagnosis not present

## 2023-12-03 DIAGNOSIS — G2581 Restless legs syndrome: Secondary | ICD-10-CM | POA: Diagnosis not present

## 2023-12-03 DIAGNOSIS — J410 Simple chronic bronchitis: Secondary | ICD-10-CM | POA: Diagnosis not present

## 2023-12-03 DIAGNOSIS — F988 Other specified behavioral and emotional disorders with onset usually occurring in childhood and adolescence: Secondary | ICD-10-CM | POA: Diagnosis not present

## 2023-12-07 DIAGNOSIS — F321 Major depressive disorder, single episode, moderate: Secondary | ICD-10-CM | POA: Diagnosis not present

## 2023-12-07 DIAGNOSIS — E7800 Pure hypercholesterolemia, unspecified: Secondary | ICD-10-CM | POA: Diagnosis not present

## 2023-12-07 DIAGNOSIS — J410 Simple chronic bronchitis: Secondary | ICD-10-CM | POA: Diagnosis not present

## 2023-12-07 DIAGNOSIS — F418 Other specified anxiety disorders: Secondary | ICD-10-CM | POA: Diagnosis not present

## 2023-12-07 DIAGNOSIS — E118 Type 2 diabetes mellitus with unspecified complications: Secondary | ICD-10-CM | POA: Diagnosis not present

## 2023-12-07 DIAGNOSIS — I10 Essential (primary) hypertension: Secondary | ICD-10-CM | POA: Diagnosis not present

## 2023-12-17 DIAGNOSIS — E118 Type 2 diabetes mellitus with unspecified complications: Secondary | ICD-10-CM | POA: Diagnosis not present

## 2023-12-17 DIAGNOSIS — J410 Simple chronic bronchitis: Secondary | ICD-10-CM | POA: Diagnosis not present

## 2023-12-17 DIAGNOSIS — I10 Essential (primary) hypertension: Secondary | ICD-10-CM | POA: Diagnosis not present

## 2023-12-18 DIAGNOSIS — L821 Other seborrheic keratosis: Secondary | ICD-10-CM | POA: Diagnosis not present

## 2023-12-18 DIAGNOSIS — L578 Other skin changes due to chronic exposure to nonionizing radiation: Secondary | ICD-10-CM | POA: Diagnosis not present

## 2023-12-18 DIAGNOSIS — D485 Neoplasm of uncertain behavior of skin: Secondary | ICD-10-CM | POA: Diagnosis not present

## 2023-12-23 DIAGNOSIS — C672 Malignant neoplasm of lateral wall of bladder: Secondary | ICD-10-CM | POA: Diagnosis not present

## 2023-12-23 DIAGNOSIS — R1032 Left lower quadrant pain: Secondary | ICD-10-CM | POA: Diagnosis not present

## 2023-12-25 ENCOUNTER — Other Ambulatory Visit (HOSPITAL_BASED_OUTPATIENT_CLINIC_OR_DEPARTMENT_OTHER): Payer: Self-pay | Admitting: Urology

## 2023-12-25 DIAGNOSIS — C672 Malignant neoplasm of lateral wall of bladder: Secondary | ICD-10-CM

## 2023-12-30 ENCOUNTER — Ambulatory Visit (HOSPITAL_BASED_OUTPATIENT_CLINIC_OR_DEPARTMENT_OTHER)
Admission: RE | Admit: 2023-12-30 | Discharge: 2023-12-30 | Disposition: A | Discharge status: Home / Self Care | Source: Ambulatory Visit | Attending: Urology | Admitting: Urology

## 2023-12-30 DIAGNOSIS — C672 Malignant neoplasm of lateral wall of bladder: Secondary | ICD-10-CM | POA: Insufficient documentation

## 2023-12-30 DIAGNOSIS — Z9049 Acquired absence of other specified parts of digestive tract: Secondary | ICD-10-CM | POA: Diagnosis not present

## 2023-12-30 DIAGNOSIS — K5730 Diverticulosis of large intestine without perforation or abscess without bleeding: Secondary | ICD-10-CM | POA: Diagnosis not present

## 2023-12-30 MED ORDER — IOHEXOL 300 MG/ML  SOLN
100.0000 mL | Freq: Once | INTRAMUSCULAR | Status: AC | PRN
  Administered 2023-12-30: 100 mL via INTRAVENOUS

## 2024-01-06 DIAGNOSIS — F321 Major depressive disorder, single episode, moderate: Secondary | ICD-10-CM | POA: Diagnosis not present

## 2024-01-06 DIAGNOSIS — I10 Essential (primary) hypertension: Secondary | ICD-10-CM | POA: Diagnosis not present

## 2024-01-06 DIAGNOSIS — E7800 Pure hypercholesterolemia, unspecified: Secondary | ICD-10-CM | POA: Diagnosis not present

## 2024-01-06 DIAGNOSIS — E118 Type 2 diabetes mellitus with unspecified complications: Secondary | ICD-10-CM | POA: Diagnosis not present

## 2024-01-06 DIAGNOSIS — J410 Simple chronic bronchitis: Secondary | ICD-10-CM | POA: Diagnosis not present

## 2024-01-06 DIAGNOSIS — F418 Other specified anxiety disorders: Secondary | ICD-10-CM | POA: Diagnosis not present

## 2024-01-16 DIAGNOSIS — E118 Type 2 diabetes mellitus with unspecified complications: Secondary | ICD-10-CM | POA: Diagnosis not present

## 2024-01-16 DIAGNOSIS — J410 Simple chronic bronchitis: Secondary | ICD-10-CM | POA: Diagnosis not present

## 2024-01-16 DIAGNOSIS — I10 Essential (primary) hypertension: Secondary | ICD-10-CM | POA: Diagnosis not present

## 2024-02-06 DIAGNOSIS — E7800 Pure hypercholesterolemia, unspecified: Secondary | ICD-10-CM | POA: Diagnosis not present

## 2024-02-06 DIAGNOSIS — J410 Simple chronic bronchitis: Secondary | ICD-10-CM | POA: Diagnosis not present

## 2024-02-06 DIAGNOSIS — I10 Essential (primary) hypertension: Secondary | ICD-10-CM | POA: Diagnosis not present

## 2024-02-06 DIAGNOSIS — E118 Type 2 diabetes mellitus with unspecified complications: Secondary | ICD-10-CM | POA: Diagnosis not present

## 2024-02-06 DIAGNOSIS — F321 Major depressive disorder, single episode, moderate: Secondary | ICD-10-CM | POA: Diagnosis not present

## 2024-02-06 DIAGNOSIS — F418 Other specified anxiety disorders: Secondary | ICD-10-CM | POA: Diagnosis not present

## 2024-02-15 DIAGNOSIS — I10 Essential (primary) hypertension: Secondary | ICD-10-CM | POA: Diagnosis not present

## 2024-02-15 DIAGNOSIS — E118 Type 2 diabetes mellitus with unspecified complications: Secondary | ICD-10-CM | POA: Diagnosis not present

## 2024-02-15 DIAGNOSIS — J410 Simple chronic bronchitis: Secondary | ICD-10-CM | POA: Diagnosis not present

## 2024-03-19 DIAGNOSIS — E7800 Pure hypercholesterolemia, unspecified: Secondary | ICD-10-CM | POA: Diagnosis not present

## 2024-05-28 ENCOUNTER — Ambulatory Visit: Admitting: Gastroenterology
